# Patient Record
Sex: Male | Born: 1945 | Race: Black or African American | Hispanic: No | Smoking: Current some day smoker
Health system: Southern US, Community
[De-identification: ages and names within clinical notes are randomized; demographics above are authoritative.]

## PROBLEM LIST (undated history)

## (undated) DIAGNOSIS — I428 Other cardiomyopathies: Secondary | ICD-10-CM

## (undated) DIAGNOSIS — E785 Hyperlipidemia, unspecified: Secondary | ICD-10-CM

## (undated) DIAGNOSIS — K219 Gastro-esophageal reflux disease without esophagitis: Secondary | ICD-10-CM

## (undated) DIAGNOSIS — N400 Enlarged prostate without lower urinary tract symptoms: Secondary | ICD-10-CM

## (undated) DIAGNOSIS — I8290 Acute embolism and thrombosis of unspecified vein: Secondary | ICD-10-CM

## (undated) DIAGNOSIS — IMO0001 Reserved for inherently not codable concepts without codable children: Secondary | ICD-10-CM

## (undated) DIAGNOSIS — D649 Anemia, unspecified: Secondary | ICD-10-CM

## (undated) DIAGNOSIS — M545 Low back pain, unspecified: Secondary | ICD-10-CM

## (undated) DIAGNOSIS — R51 Headache: Secondary | ICD-10-CM

## (undated) DIAGNOSIS — I10 Essential (primary) hypertension: Secondary | ICD-10-CM

## (undated) DIAGNOSIS — I2510 Atherosclerotic heart disease of native coronary artery without angina pectoris: Secondary | ICD-10-CM

## (undated) DIAGNOSIS — E119 Type 2 diabetes mellitus without complications: Secondary | ICD-10-CM

## (undated) DIAGNOSIS — Q2730 Arteriovenous malformation, site unspecified: Secondary | ICD-10-CM

## (undated) DIAGNOSIS — Z5189 Encounter for other specified aftercare: Secondary | ICD-10-CM

## (undated) DIAGNOSIS — N182 Chronic kidney disease, stage 2 (mild): Secondary | ICD-10-CM

## (undated) DIAGNOSIS — S82401A Unspecified fracture of shaft of right fibula, initial encounter for closed fracture: Secondary | ICD-10-CM

## (undated) DIAGNOSIS — R55 Syncope and collapse: Secondary | ICD-10-CM

## (undated) DIAGNOSIS — I872 Venous insufficiency (chronic) (peripheral): Secondary | ICD-10-CM

## (undated) DIAGNOSIS — S82891A Other fracture of right lower leg, initial encounter for closed fracture: Secondary | ICD-10-CM

## (undated) DIAGNOSIS — F17200 Nicotine dependence, unspecified, uncomplicated: Secondary | ICD-10-CM

## (undated) HISTORY — DX: Hyperlipidemia, unspecified: E78.5

## (undated) HISTORY — DX: Arteriovenous malformation, site unspecified: Q27.30

## (undated) HISTORY — DX: Benign prostatic hyperplasia without lower urinary tract symptoms: N40.0

## (undated) HISTORY — DX: Nicotine dependence, unspecified, uncomplicated: F17.200

## (undated) HISTORY — DX: Low back pain, unspecified: M54.50

## (undated) HISTORY — DX: Atherosclerotic heart disease of native coronary artery without angina pectoris: I25.10

## (undated) HISTORY — DX: Reserved for inherently not codable concepts without codable children: IMO0001

## (undated) HISTORY — DX: Other cardiomyopathies: I42.8

## (undated) HISTORY — PX: CHOLECYSTECTOMY: SHX55

## (undated) HISTORY — DX: Low back pain: M54.5

## (undated) HISTORY — DX: Type 2 diabetes mellitus without complications: E11.9

## (undated) HISTORY — PX: APPENDECTOMY: SHX54

## (undated) HISTORY — DX: Essential (primary) hypertension: I10

## (undated) HISTORY — DX: Gastro-esophageal reflux disease without esophagitis: K21.9

## (undated) HISTORY — PX: CARDIAC CATHETERIZATION: SHX172

## (undated) HISTORY — DX: Acute embolism and thrombosis of unspecified vein: I82.90

## (undated) HISTORY — DX: Venous insufficiency (chronic) (peripheral): I87.2

---

## 1998-06-29 ENCOUNTER — Emergency Department (HOSPITAL_COMMUNITY): Admission: EM | Admit: 1998-06-29 | Discharge: 1998-06-29 | Payer: Self-pay | Admitting: Emergency Medicine

## 1998-06-29 ENCOUNTER — Encounter: Payer: Self-pay | Admitting: Family Medicine

## 1999-04-01 ENCOUNTER — Emergency Department (HOSPITAL_COMMUNITY): Admission: EM | Admit: 1999-04-01 | Discharge: 1999-04-01 | Payer: Self-pay | Admitting: Emergency Medicine

## 1999-06-24 ENCOUNTER — Encounter: Payer: Self-pay | Admitting: Emergency Medicine

## 1999-06-24 ENCOUNTER — Emergency Department (HOSPITAL_COMMUNITY): Admission: EM | Admit: 1999-06-24 | Discharge: 1999-06-24 | Payer: Self-pay | Admitting: Emergency Medicine

## 2000-02-05 ENCOUNTER — Ambulatory Visit (HOSPITAL_COMMUNITY): Admission: RE | Admit: 2000-02-05 | Discharge: 2000-02-05 | Payer: Self-pay | Admitting: Family Medicine

## 2000-02-05 ENCOUNTER — Encounter: Payer: Self-pay | Admitting: Family Medicine

## 2000-07-29 ENCOUNTER — Encounter: Payer: Self-pay | Admitting: Family Medicine

## 2000-07-29 ENCOUNTER — Ambulatory Visit (HOSPITAL_COMMUNITY): Admission: RE | Admit: 2000-07-29 | Discharge: 2000-07-29 | Payer: Self-pay | Admitting: Family Medicine

## 2003-08-07 ENCOUNTER — Emergency Department (HOSPITAL_COMMUNITY): Admission: EM | Admit: 2003-08-07 | Discharge: 2003-08-07 | Payer: Self-pay | Admitting: Emergency Medicine

## 2005-06-23 ENCOUNTER — Inpatient Hospital Stay (HOSPITAL_COMMUNITY): Admission: EM | Admit: 2005-06-23 | Discharge: 2005-06-25 | Payer: Self-pay | Admitting: Emergency Medicine

## 2005-06-23 ENCOUNTER — Ambulatory Visit: Payer: Self-pay | Admitting: Cardiology

## 2005-07-11 ENCOUNTER — Ambulatory Visit: Payer: Self-pay | Admitting: Internal Medicine

## 2005-10-06 ENCOUNTER — Inpatient Hospital Stay (HOSPITAL_COMMUNITY): Admission: EM | Admit: 2005-10-06 | Discharge: 2005-10-09 | Payer: Self-pay | Admitting: Emergency Medicine

## 2005-10-06 ENCOUNTER — Ambulatory Visit: Payer: Self-pay | Admitting: Cardiovascular Disease

## 2005-10-06 ENCOUNTER — Encounter (INDEPENDENT_AMBULATORY_CARE_PROVIDER_SITE_OTHER): Payer: Self-pay | Admitting: Cardiology

## 2005-10-26 ENCOUNTER — Inpatient Hospital Stay (HOSPITAL_COMMUNITY): Admission: EM | Admit: 2005-10-26 | Discharge: 2005-10-30 | Payer: Self-pay | Admitting: Emergency Medicine

## 2005-10-27 ENCOUNTER — Encounter: Payer: Self-pay | Admitting: Gastroenterology

## 2005-10-28 ENCOUNTER — Encounter (INDEPENDENT_AMBULATORY_CARE_PROVIDER_SITE_OTHER): Payer: Self-pay | Admitting: *Deleted

## 2005-10-28 ENCOUNTER — Encounter: Payer: Self-pay | Admitting: Gastroenterology

## 2005-10-28 ENCOUNTER — Encounter (INDEPENDENT_AMBULATORY_CARE_PROVIDER_SITE_OTHER): Payer: Self-pay | Admitting: Specialist

## 2005-10-29 ENCOUNTER — Ambulatory Visit: Payer: Self-pay | Admitting: Gastroenterology

## 2005-12-02 ENCOUNTER — Ambulatory Visit: Payer: Self-pay | Admitting: Gastroenterology

## 2006-01-22 ENCOUNTER — Ambulatory Visit (HOSPITAL_COMMUNITY): Admission: RE | Admit: 2006-01-22 | Discharge: 2006-01-22 | Payer: Self-pay | Admitting: Internal Medicine

## 2006-08-17 ENCOUNTER — Emergency Department (HOSPITAL_COMMUNITY): Admission: EM | Admit: 2006-08-17 | Discharge: 2006-08-18 | Payer: Self-pay | Admitting: Emergency Medicine

## 2007-05-03 ENCOUNTER — Emergency Department (HOSPITAL_COMMUNITY): Admission: EM | Admit: 2007-05-03 | Discharge: 2007-05-03 | Payer: Self-pay | Admitting: Emergency Medicine

## 2007-05-05 ENCOUNTER — Emergency Department (HOSPITAL_COMMUNITY): Admission: EM | Admit: 2007-05-05 | Discharge: 2007-05-05 | Payer: Self-pay | Admitting: Emergency Medicine

## 2007-05-06 ENCOUNTER — Ambulatory Visit (HOSPITAL_COMMUNITY): Admission: RE | Admit: 2007-05-06 | Discharge: 2007-05-06 | Payer: Self-pay | Admitting: *Deleted

## 2007-05-07 ENCOUNTER — Ambulatory Visit: Payer: Self-pay | Admitting: Vascular Surgery

## 2007-05-07 ENCOUNTER — Inpatient Hospital Stay (HOSPITAL_COMMUNITY): Admission: AD | Admit: 2007-05-07 | Discharge: 2007-05-10 | Payer: Self-pay | Admitting: Internal Medicine

## 2007-05-07 ENCOUNTER — Encounter (INDEPENDENT_AMBULATORY_CARE_PROVIDER_SITE_OTHER): Payer: Self-pay | Admitting: Internal Medicine

## 2007-05-20 ENCOUNTER — Emergency Department (HOSPITAL_COMMUNITY): Admission: EM | Admit: 2007-05-20 | Discharge: 2007-05-20 | Payer: Self-pay | Admitting: *Deleted

## 2007-05-24 ENCOUNTER — Encounter: Admission: RE | Admit: 2007-05-24 | Discharge: 2007-05-24 | Payer: Self-pay | Admitting: Internal Medicine

## 2007-05-28 ENCOUNTER — Emergency Department (HOSPITAL_COMMUNITY): Admission: EM | Admit: 2007-05-28 | Discharge: 2007-05-28 | Payer: Self-pay | Admitting: Emergency Medicine

## 2007-07-08 ENCOUNTER — Emergency Department (HOSPITAL_COMMUNITY): Admission: EM | Admit: 2007-07-08 | Discharge: 2007-07-08 | Payer: Self-pay | Admitting: Emergency Medicine

## 2007-10-12 ENCOUNTER — Ambulatory Visit: Payer: Self-pay | Admitting: Vascular Surgery

## 2007-10-12 ENCOUNTER — Encounter (INDEPENDENT_AMBULATORY_CARE_PROVIDER_SITE_OTHER): Payer: Self-pay | Admitting: Emergency Medicine

## 2007-10-12 ENCOUNTER — Inpatient Hospital Stay (HOSPITAL_COMMUNITY): Admission: EM | Admit: 2007-10-12 | Discharge: 2007-10-18 | Payer: Self-pay | Admitting: Emergency Medicine

## 2008-01-17 ENCOUNTER — Emergency Department (HOSPITAL_COMMUNITY): Admission: EM | Admit: 2008-01-17 | Discharge: 2008-01-17 | Payer: Self-pay | Admitting: Family Medicine

## 2008-12-25 ENCOUNTER — Inpatient Hospital Stay (HOSPITAL_COMMUNITY): Admission: EM | Admit: 2008-12-25 | Discharge: 2008-12-26 | Payer: Self-pay | Admitting: Emergency Medicine

## 2009-01-09 ENCOUNTER — Emergency Department (HOSPITAL_COMMUNITY): Admission: EM | Admit: 2009-01-09 | Discharge: 2009-01-09 | Payer: Self-pay | Admitting: Emergency Medicine

## 2009-01-12 ENCOUNTER — Ambulatory Visit: Payer: Self-pay | Admitting: Gastroenterology

## 2009-01-12 ENCOUNTER — Encounter (INDEPENDENT_AMBULATORY_CARE_PROVIDER_SITE_OTHER): Payer: Self-pay | Admitting: Emergency Medicine

## 2009-01-12 ENCOUNTER — Inpatient Hospital Stay (HOSPITAL_COMMUNITY): Admission: EM | Admit: 2009-01-12 | Discharge: 2009-01-18 | Payer: Self-pay | Admitting: Emergency Medicine

## 2009-01-12 ENCOUNTER — Ambulatory Visit: Payer: Self-pay | Admitting: Vascular Surgery

## 2009-01-15 ENCOUNTER — Encounter (INDEPENDENT_AMBULATORY_CARE_PROVIDER_SITE_OTHER): Payer: Self-pay | Admitting: *Deleted

## 2009-01-15 ENCOUNTER — Encounter: Payer: Self-pay | Admitting: Gastroenterology

## 2009-01-16 ENCOUNTER — Ambulatory Visit: Payer: Self-pay | Admitting: Vascular Surgery

## 2009-01-18 ENCOUNTER — Encounter (INDEPENDENT_AMBULATORY_CARE_PROVIDER_SITE_OTHER): Payer: Self-pay | Admitting: *Deleted

## 2009-02-22 ENCOUNTER — Ambulatory Visit: Payer: Self-pay | Admitting: Vascular Surgery

## 2009-08-23 ENCOUNTER — Ambulatory Visit: Payer: Self-pay | Admitting: Vascular Surgery

## 2009-12-26 ENCOUNTER — Emergency Department (HOSPITAL_COMMUNITY): Admission: EM | Admit: 2009-12-26 | Discharge: 2009-12-26 | Payer: Self-pay | Admitting: Emergency Medicine

## 2010-01-17 ENCOUNTER — Ambulatory Visit: Payer: Self-pay | Admitting: Cardiology

## 2010-01-17 DIAGNOSIS — I10 Essential (primary) hypertension: Secondary | ICD-10-CM | POA: Insufficient documentation

## 2010-01-23 ENCOUNTER — Ambulatory Visit: Payer: Self-pay | Admitting: Internal Medicine

## 2010-01-23 ENCOUNTER — Ambulatory Visit (HOSPITAL_COMMUNITY): Admission: RE | Admit: 2010-01-23 | Discharge: 2010-01-23 | Payer: Self-pay | Admitting: Internal Medicine

## 2010-01-23 ENCOUNTER — Encounter: Payer: Self-pay | Admitting: Cardiology

## 2010-01-23 ENCOUNTER — Ambulatory Visit: Payer: Self-pay | Admitting: Cardiovascular Disease

## 2010-01-23 ENCOUNTER — Ambulatory Visit: Payer: Self-pay

## 2010-01-23 DIAGNOSIS — I2510 Atherosclerotic heart disease of native coronary artery without angina pectoris: Secondary | ICD-10-CM

## 2010-01-29 ENCOUNTER — Telehealth: Payer: Self-pay | Admitting: Cardiology

## 2010-01-30 ENCOUNTER — Emergency Department (HOSPITAL_COMMUNITY): Admission: EM | Admit: 2010-01-30 | Discharge: 2010-01-30 | Payer: Self-pay | Admitting: Emergency Medicine

## 2010-01-31 ENCOUNTER — Telehealth: Payer: Self-pay | Admitting: Cardiology

## 2010-02-12 DIAGNOSIS — R9389 Abnormal findings on diagnostic imaging of other specified body structures: Secondary | ICD-10-CM

## 2010-02-12 LAB — CONVERTED CEMR LAB
HDL: 37.4 mg/dL — ABNORMAL LOW (ref >39.00)
LDL Cholesterol: 130 mg/dL — ABNORMAL HIGH (ref 0–99)
Triglycerides: 80 mg/dL (ref 0.0–149.0)
VLDL: 16 mg/dL (ref 0.0–40.0)

## 2010-02-18 ENCOUNTER — Telehealth (INDEPENDENT_AMBULATORY_CARE_PROVIDER_SITE_OTHER): Payer: Self-pay | Admitting: *Deleted

## 2010-02-19 ENCOUNTER — Encounter (HOSPITAL_COMMUNITY): Admission: RE | Admit: 2010-02-19 | Discharge: 2010-03-04 | Payer: Self-pay | Admitting: Cardiology

## 2010-02-19 ENCOUNTER — Encounter: Payer: Self-pay | Admitting: Cardiology

## 2010-02-19 ENCOUNTER — Encounter: Payer: Self-pay | Admitting: Cardiovascular Disease

## 2010-02-19 ENCOUNTER — Ambulatory Visit: Payer: Self-pay

## 2010-02-19 ENCOUNTER — Ambulatory Visit: Payer: Self-pay | Admitting: Cardiology

## 2010-02-26 ENCOUNTER — Ambulatory Visit: Payer: Self-pay | Admitting: Cardiology

## 2010-02-26 DIAGNOSIS — E785 Hyperlipidemia, unspecified: Secondary | ICD-10-CM

## 2010-02-28 ENCOUNTER — Ambulatory Visit: Payer: Self-pay | Admitting: Cardiology

## 2010-03-05 LAB — CONVERTED CEMR LAB
BUN: 19 mg/dL (ref 6–23)
Basophils Absolute: 0.1 10*3/uL (ref 0.0–0.1)
CO2: 28 meq/L (ref 19–32)
Creatinine, Ser: 1.5 mg/dL (ref 0.4–1.5)
Eosinophils Relative: 5.7 % — ABNORMAL HIGH (ref 0.0–5.0)
GFR calc non Af Amer: 59.6 mL/min (ref >60)
HCT: 37.1 % — ABNORMAL LOW (ref 39.0–52.0)
Hemoglobin: 12.1 g/dL — ABNORMAL LOW (ref 13.0–17.0)
INR: 1 (ref 0.8–1.0)
Lymphocytes Relative: 23.3 % (ref 12.0–46.0)
Neutro Abs: 3.9 10*3/uL (ref 1.4–7.7)
Platelets: 179 10*3/uL (ref 150.0–400.0)
Sodium: 138 meq/L (ref 135–145)

## 2010-03-06 ENCOUNTER — Ambulatory Visit: Payer: Self-pay | Admitting: Cardiology

## 2010-03-06 ENCOUNTER — Telehealth: Payer: Self-pay | Admitting: Cardiology

## 2010-03-06 ENCOUNTER — Inpatient Hospital Stay (HOSPITAL_BASED_OUTPATIENT_CLINIC_OR_DEPARTMENT_OTHER): Admission: RE | Admit: 2010-03-06 | Discharge: 2010-03-06 | Payer: Self-pay | Admitting: Cardiology

## 2010-03-18 ENCOUNTER — Ambulatory Visit: Payer: Self-pay | Admitting: Cardiology

## 2010-03-18 ENCOUNTER — Telehealth: Payer: Self-pay | Admitting: Cardiology

## 2010-03-18 DIAGNOSIS — R609 Edema, unspecified: Secondary | ICD-10-CM

## 2010-03-18 DIAGNOSIS — R0602 Shortness of breath: Secondary | ICD-10-CM | POA: Insufficient documentation

## 2010-03-19 ENCOUNTER — Telehealth: Payer: Self-pay | Admitting: Cardiology

## 2010-04-01 ENCOUNTER — Telehealth: Payer: Self-pay | Admitting: Cardiology

## 2010-04-17 ENCOUNTER — Telehealth: Payer: Self-pay | Admitting: Cardiology

## 2010-04-17 ENCOUNTER — Ambulatory Visit: Payer: Self-pay

## 2010-04-23 ENCOUNTER — Ambulatory Visit: Payer: Self-pay | Admitting: Cardiology

## 2010-06-18 NOTE — Progress Notes (Signed)
Summary: B/P readings/RETURNING YOUR CALL   Phone Note Outgoing Call   Call placed by: Katina Dung, RN, BSN,  April 17, 2010 3:05 PM Call placed to: Patient Summary of Call: B/P readings  Follow-up for Phone Call        [facility] to get recent B/P readings Katina Dung, RN, BSN  April 17, 2010 3:16 PM--LMTCB Katina Dung, RN, BSN  April 17, 2010 5:33 PM --NA Katina Dung, RN, BSN  April 18, 2010 10:01 AM [facility] Katina Dung, RN, BSN  April 19, 2010 2:02 PM      Additional Follow-up for Phone Call Additional follow up Details #1::        L..Last PT : 10.6  02/28/2010    Additional Follow-up for Phone Call Additional follow up Details #2::    PT RETURNING YOU CALL.Last PT : 10.6  02/28/2010 STATES HE WILL BE HOME FOR THEN HE WILL BE GOING OUT OF TOWN. PT ALSO STATES HIS BLOOD PRESSURE HAS BEEN FINE.--I will forward to Dr Shirlee Latch for review Follow-up by: Roe Coombs,  April 22, 2010 11:39 AM

## 2010-06-18 NOTE — Assessment & Plan Note (Signed)
Summary: OK PER ANNE/PER CALL TO PT/SF  Medications Added METOPROLOL SUCCINATE 100 MG XR24H-TAB (METOPROLOL SUCCINATE) Take one and one-half  tablets  by mouth daily AVODART 0.5 MG CAPS (DUTASTERIDE) 1 by mouth daily BIDIL 20-37.5 MG TABS (ISOSORB DINITRATE-HYDRALAZINE) one three times a day OXYCODONE-ACETAMINOPHEN 5-325 MG TABS (OXYCODONE-ACETAMINOPHEN) as needed      Allergies Added: NKDA  Primary Provider:  Dr. Pecola Leisure   History of Present Illness: 65 yo with history of multiple DVTs, labile HTN, and diabetes returns for cardiology followup.   He has a long history of DVTs, most recently in 8/10, with resulting chronic venous insufficiency.  He has an IVC filter.  His coumadin was stopped about 1.5 years ago (no history of GI bleeding).    Patient has reported some exertional dyspnea recently.  He is short of breath climbing a flight of steps or doing more moderate exertion.  Not much problem on flat ground.  No chest pain or tightness.  He had an echo done given the exertional dyspnea which showed septal and apical akinesis with EF 45%.  Because of this, Lexiscan-myoview was done showing EF 43% and a possible small area of apical ischemia.  LDL was noted to be elevated and patient was recently started on a statin.    Labs (8/11): creatinine 1.4 Labs (9/11): LDL 130, HDL 37  Current Medications (verified): 1)  Diltiazem Hcl 120 Mg Tabs (Diltiazem Hcl) .Marland Kitchen.. 1 Tab Qd 2)  Metoprolol Succinate 100 Mg Xr24h-Tab (Metoprolol Succinate) .... Take One Tablet By Mouth Daily 3)  Azor 5-40 Mg Tabs (Amlodipine-Olmesartan) .Marland Kitchen.. 1tabqd 4)  Glipizide 5 Mg Tabs (Glipizide) .Marland Kitchen.. 1tabqd 5)  Protonix 40 Mg Tbec (Pantoprazole Sodium) .Marland Kitchen.. 1tabqd 6)  Avodart 0.5 Mg Caps (Dutasteride) .Marland Kitchen.. 1 By Mouth Daily 7)  Hydralazine Hcl 25 Mg Tabs (Hydralazine Hcl) .Marland Kitchen.. 1 Three Times A Day 8)  Aspirin 81 Mg Tbec (Aspirin) .... Take One Tablet By Mouth Daily 9)  Simvastatin 40 Mg Tabs (Simvastatin) .... One in The  Evening 10)  Oxycodone-Acetaminophen 5-325 Mg Tabs (Oxycodone-Acetaminophen) .... As Needed  Allergies (verified): No Known Drug Allergies  Past History:  Past Medical History: 1. 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  IVC filter placed in 2005.  Most recent DVT was in right leg in 8/11.   2.  Duodenal AVM with GI bleed 3.  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  Diabetes mellitus type II 5.  Smoking 6.  GERD 7.  BPH 8.  Low back pain 9.  HTN 10.  CAD: Left heart cath 2007 with 40-50% stenosis in a small RCA, EF 50%.  Echo (9/11): Septal and apical akinesis, dilated LV, EF 45%, RV normal size and systolic function. Lexiscan myoview (10/11): EF 43%, global hypokinesis, possible small area of apical ischemia.  11.  Hyperlipidemia  Review of Systems       All systems reviewed and negative except as per HPI.   Vital Signs:  Patient profile:   65 year old male Height:      72 inches Weight:      201 pounds BMI:     27.36 Pulse rate:   83 / minute Resp:     18 per minute BP sitting:   143 / 87  (left arm)  Vitals Entered By: Marrion Coy, CNA (February 26, 2010 8:39 AM)  Physical Exam  General:  Well developed, well nourished, in no acute distress. Neck:  Neck supple, no JVD. No masses, thyromegaly  or abnormal cervical nodes. Lungs:  Clear bilaterally to auscultation and percussion. Heart:  Non-displaced PMI, chest non-tender; regular rate and rhythm, S1, S2 without murmurs, rubs or gallops. Carotid upstroke normal, no bruit. 1+ edema to the knee on the right, 1+ edema 1/2 up the lower leg on the left.   Abdomen:  Bowel sounds positive; abdomen soft and non-tender without masses, organomegaly, or hernias noted. No hepatosplenomegaly. Extremities:  No clubbing or cyanosis. Neurologic:  Alert and oriented x 3. Psych:  Normal affect.   Impression & Recommendations:  Problem # 1:  CORONARY ATHEROSCLEROSIS NATIVE CORONARY ARTERY (ICD-414.01) Patient  has a history of nonobstructive CAD.  He has exertional dyspnea that seems relatively new.  Echo showed wall motion abnormalities (septum and apex) with EF 45%.  Myoview confirmed mildly decreased EF and showed some possible apical ischemia.  It is possible that the dyspnea is an anginal equivalent.  Pulmonary hypertension due to chronic thromboembolic disease would also be a concern in this patient but the RV looked normal on echo, making this diagnosis less likely.   - Continue ASA, statin, beta blocker - Will arrange for left heart cath, will use radial access. BMET today prior to cath.   Problem # 2:  ECHOCARDIOGRAM, ABNORMAL (ICD-793.2) Mildly decreased LV systolic function.  Will stop diltiazem.  Will increase Toprol XL to 150 mg qday and instead of hydralazine will have him take Bidil 1 tab three times a day.  He is already on an ARB.   Problem # 3:  HYPERTENSION, UNSPECIFIED (ICD-401.9) BP is still mildly elevated.  As above, will increase Toprol XL to 150 mg daily and stop hydralazine (starting Bidil instead).  Will stop diltiazem given decreased LV systolic function.   Problem # 4:  HYPERLIPIDEMIA-MIXED (ICD-272.4) Check lipids on statin in 2 months.   Problem # 5:  VENOUS THROMBOEMBOLISM Patient has had multiple DVTs over the last 10 years.  He now has chronic venous insufficiency. He does have an IVC filter.  I think that he needs to restart coumadin as I think that his risk of a recurrent event is very high (has not restarted it yet).  I will have him wait until after cath next week to restart coumadin.  If he needs intervention, would favor bare metal stent.   Patient Instructions: 1)  Your physician has recommended you make the following change in your medication:  2)  Stop Diltiazem. 3)  Increase Toprol(metoprolol) XL  to 150mg  daily--this will be one and one-half 100mg  tablets daily. 4)  Stop Hydralazine. 5)  Start BiDil 20/37.5mg  three times a day. 6)  Lab this Thursday  October 13--BMP/CBC/PT 786.09  794.30 7)  Your physician recommends that you return for a FASTING lipid profile/liver profile in 2 months--786.09  794.30  8)  Your physician has requested that you have a cardiac catheterization.  Cardiac catheterization is used to diagnose and/or treat various heart conditions. Doctors may recommend this procedure for a number of different reasons. The most common reason is to evaluate chest pain. Chest pain can be a symptom of coronary artery disease (CAD), and cardiac catheterization can show whether plaque is narrowing or blocking your heart's arteries. This procedure is also used to evaluate the valves, as well as measure the blood flow and oxygen levels in different parts of your heart.  For further information please visit [email or web address]  Please follow instruction sheet, as given. 9)  Wednesday October 19,2011 10)  Your physician recommends that you schedule a  follow-up appointment 2 weeks  after the cath on Wednesday October 19,2011. Prescriptions: SIMVASTATIN 40 MG TABS (SIMVASTATIN) one in the evening  #30 x 3   Entered by:   Katina Dung, RN, BSN   Authorized by:   Marca Ancona, MD   Signed by:   Katina Dung, RN, BSN on 02/26/2010   Method used:   Electronically to        [facility] [phone]* (retail)       [address]       [place], [place]  [address]       Ph: [phone]       Fax: [phone]   RxID:   [id number] BIDIL 20-37.5 MG TABS (ISOSORB DINITRATE-HYDRALAZINE) one three times a day  #90 x 6   Entered by:   Katina Dung, RN, BSN   Authorized by:   Marca Ancona, MD   Signed by:   Katina Dung, RN, BSN on 02/26/2010   Method used:   Electronically to        [facility] [phone] (retail)       [address]       [place], [place]  [address]       Ph: [phone]       Fax: [phone]   RxID:   [id number] METOPROLOL SUCCINATE 100 MG XR24H-TAB (METOPROLOL SUCCINATE) Take one and one-half  tablets  by  mouth daily  #50 x 6   Entered by:   Katina Dung, RN, BSN   Authorized by:   Marca Ancona, MD   Signed by:   Katina Dung, RN, BSN on 02/26/2010   Method used:   Electronically to        [facility] [phone]* (retail)       [address]       [place], [place]  [address]       Ph: [phone]       Fax: [phone]   RxID:   [id number]

## 2010-06-18 NOTE — Progress Notes (Signed)
Summary: meds renewal   Phone Note Other Incoming   Caller: insurance/[name] [facility] Summary of Call: [name] from [facility] pre[facility] states meds are not cover by the plan AZOR 5-40 MG TABS 1 daily and BIDIL 20-37.5 MG TAB.  [name] states he will fax a form to get the meds approved please write stat so it can be process in 24 hours for the pt. if any question [phone] ref# [id number] Initial call taken by: Roe Coombs,  March 18, 2010 4:23 PM     Appended Document: meds renewal If we can't get coverage, change Azor to amlodipine 5 mg daily and olmesartan 40 mg daily and change Bidil to hydralazine 75 mg three times a day and isordil 40 mg three times a day.   Appended Document: meds renewal I talked with Mya at [facility] [phone] states Azor and Bidil are not authorized medications--for Azor Dr Shirlee Latch recommended Amlodipine 5mg  daily and Olmesartan 40mg  daily--Olmesartan is not preferred so Dr Shirlee Latch recommended Losartan 100mg  daily in the place of Olmesartan--for Bidil Dr Shirlee Latch recommended  hydralazine 75mg  three times a day and Isordil 40mg  three times a day --both of these are preferred --I called pt to let him know this ==pt states he was told Azor and Bidil would be covered by [facility] yesterday and was not  satisfied with these changes--he  stated that he would call [facility] and call me back

## 2010-06-18 NOTE — Progress Notes (Signed)
Summary: B/P readings   Phone Note Outgoing Call   Call placed by: Katina Dung, RN, BSN,  April 01, 2010 7:25 AM Call placed to: Patient Summary of Call: call and get B/P readings  Follow-up for Phone Call        Dr Shirlee Latch saw pt 03/18/10--meds increased--insurance would not authorize Azor and Bi Dil meds changed 03/19/10 from Azor to Amlodipine  5mg   and Losartan 100mg  --BiDIl changed to Isosorbide 40mg  tid  and Hydrazaline 75mg  three times a day call--call and get B/P  readings [name]  talked with pt about his B/P--recent B/P readings 03/25/10 150/120 03/27/10  106/97 03/31/10  149/? 04/01/10  106/97 pt states he hasn't  picked up all his B/P medications yet--there is one that he still needs to get--he did not have time to review his medications right now but said he would call me back in about an hour so we could review his medications [name] --I talked with pt--meds reviewed over telephone--pt only taking Metoprolol XL 100mg  daily and he has not started  Isosorbide 40mg  three times a day yet--he will get the Isosorbide 40mg  tid  today and begin taking Isosorbide  40mg  three times a day --he will also increase Metoprolol XL to 1 1/2 daily --he will continue to take and  record his B/P about 11/2 hours after he takes his morning medication--I will follow up with him in 2 weeks to get B/P readings-pt will also record a pulse when he checks his B/P --I will forward to Dr Shirlee Latch for review

## 2010-06-18 NOTE — Assessment & Plan Note (Signed)
Summary: Cardiology Nuclear Testing  Nuclear Med Background Indications for Stress Test: Evaluation for Ischemia   History: Echo, Heart Catheterization, Myocardial Infarction  History Comments: 07 Cath 40-50% small RCA and EF 50% 01/23/10 Echo EF 45% Hx of Pulm HTN  Symptoms: Dizziness, DOE    Nuclear Pre-Procedure Cardiac Risk Factors: Family History - CAD, Hypertension, Lipids, NIDDM, RBBB Caffeine/Decaff Intake: none NPO After: 9:30 PM Lungs: clear IV 0.9% NS with Angio Cath: 22g     IV Site: R Hand IV Started by: Cathlyn Parsons, RN Chest Size (in) 44     Height (in): 72 Weight (lb): 196 BMI: 26.68 Tech Comments: Pt took Metoprolol this am.  Nuclear Med Study 1 or 2 day study:  1 day     Stress Test Type:  Eugenie Birks Reading MD:  Marca Ancona, MD     Referring MD:  D.McLean Resting Radionuclide:  Technetium 48m Tetrofosmin     Resting Radionuclide Dose:  11.0 mCi  Stress Radionuclide:  Technetium 93m Tetrofosmin     Stress Radionuclide Dose:  33.0 mCi   Stress Protocol   Lexiscan: 0.4 mg   Stress Test Technologist:  Milana Na, EMT-P     Nuclear Technologist:  Domenic Polite, CNMT  Rest Procedure  Myocardial perfusion imaging was performed at rest 45 minutes following the intravenous administration of Technetium 16m Tetrofosmin.  Stress Procedure  The patient received IV Lexiscan 0.4 mg over 15-seconds.  Technetium 67m Tetrofosmin injected at 30-seconds.  There were no significant changes with infusion.  Quantitative spect images were obtained after a 45 minute delay.  QPS Raw Data Images:  Normal; no motion artifact; normal heart/lung ratio. Stress Images:  There is mild decreased uptake in the apex. Rest Images:  Normal homogeneous uptake in all areas of the myocardium. Subtraction (SDS):  Normal Transient Ischemic Dilatation:  1.16  (Normal <1.22)  Lung/Heart Ratio:  .27  (Normal <0.45)  Quantitative Gated Spect Images QGS EDV:  141 ml QGS ESV:   81 ml QGS EF:  43 % QGS cine images:  Global hypokinesis with overall moderate LV dysfunction.  Findings Low risk nuclear study      Overall Impression  Exercise Capacity: Lexiscan with no exercise. BP Response: Normal blood pressure response. Clinical Symptoms: No chest pain ECG Impression: No significant ST segment change suggestive of ischemia. Overall Impression: Low risk stress nuclear study. Overall Impression Comments: Low risk study with possible mild ischemia in the apical region.  Appended Document: Cardiology Nuclear Testing Abnormal LV function and possible apical ischemia.  I need to see him back in the clinic to discuss findings.  Would like to discuss cath.   Appended Document: Cardiology Nuclear Testing pt has an appt with Dr Shirlee Latch 02/26/2010 and results will be discussed then.  Appended Document: Cardiology Nuclear Testing JV cath 03/06/10

## 2010-06-18 NOTE — Letter (Signed)
Summary: Cardiac Catheterization Instructions- [facility]  [facility], Main Office  [address]   [place], [place] [address]   Phone: [phone]  Fax: [phone]     02/26/2010 MRN: [id number]  Dodd Offner [address] [place], [place]  [address]  Dear Mr. WHITELEY,   You are scheduled for a Cardiac Catheterization on Wednesday October 19,2011 with Dr. Marca Ancona.  Please arrive to the 1st floor of the Heart and Vascular Center at [facility] at 12 noon on the day of your procedure. Please do not arrive before 6:30 a.m. Call the Heart and Vascular Center at [phone] if you are unable to make your appointmnet. The Code to get into the parking garage under the building is 0200. Take the elevators to the 1st floor. You must have someone to drive you home. Someone must be with you for the first 24 hours after you arrive home. Please wear clothes that are easy to get on and off and wear slip-on shoes. Nothing to eat after midnight. You may have clear liquids until 7AM. Bring all your medications and current insurance cards with you.  __x_ DO NOT take these medications before your procedure:   DO NOT TAKE GLIPIZIDE THE MORNING OF THE PROCEDURE.  _x__ Make sure you take your aspirin.  _x__ You may take ALL of your remaining  medications with water that morning.    The usual length of stay after your procedure is 2 to 3 hours. This can vary.  If you have any questions, please call the office at the number listed above.   Katina Dung, RN, BSN

## 2010-06-18 NOTE — Assessment & Plan Note (Signed)
Summary: S/P CATH/ 10-19/SAF  Medications Added AZOR 5-40 MG TABS (AMLODIPINE-OLMESARTAN) 1 daily BIDIL 20-37.5 MG TABS (ISOSORB DINITRATE-HYDRALAZINE) two 3  times a day WARFARIN SODIUM 3 MG TABS (WARFARIN SODIUM) one daily or as directed      Allergies Added: NKDA  Primary Provider:  Dr. Pecola Leisure   History of Present Illness: 65 yo with history of multiple DVTs, labile HTN, and diabetes returns for cardiology followup.   He has a long history of DVTs, most recently in 8/10, with resulting chronic venous insufficiency.  He has an IVC filter.  His coumadin was stopped about 1.5 years ago (no history of GI bleeding).    Patient has reported some exertional dyspnea recently.  He is short of breath climbing a flight of steps or doing more moderate exertion.  Not much problem on flat ground.  No chest pain or tightness.  He had an echo done given the exertional dyspnea which showed septal and apical akinesis with EF 45%.  Because of this, Lexiscan-myoview was done showing EF 43% and a possible small area of apical ischemia.  LDL was noted to be elevated and patient was recently started on a statin.    Given abnormal echo and myoview and concern for CAD as the cause, LHC was done 10/11.  This showed no angiographic CAD.  The myoview was likely a false positive.  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  He has stable dyspnea with moderate exertion.  Overall feeling well.  He is going to restart coumadin tonight.  BP is elevated at 152/98 today but he has been out of Azor.   Labs (8/11): creatinine 1.4 Labs (9/11): LDL 130, HDL 37 Labs (10/11): K 4.0, creatinine 1.5  Current Medications (verified): 1)  Metoprolol Succinate 100 Mg Xr24h-Tab (Metoprolol Succinate) .... Take One and One-Half  Tablets  By Mouth Daily 2)  Azor 5-40 Mg Tabs (Amlodipine-Olmesartan) .Marland Kitchen.. 1tabqd 3)  Glipizide 5 Mg Tabs (Glipizide) .Marland Kitchen.. 1tabqd 4)  Protonix 40 Mg Tbec (Pantoprazole Sodium) .Marland Kitchen.. 1tabqd 5)   Avodart 0.5 Mg Caps (Dutasteride) .Marland Kitchen.. 1 By Mouth Daily 6)  Bidil 20-37.5 Mg Tabs (Isosorb Dinitrate-Hydralazine) .... One Three Times A Day 7)  Aspirin 81 Mg Tbec (Aspirin) .... Take One Tablet By Mouth Daily 8)  Simvastatin 40 Mg Tabs (Simvastatin) .... One in The Evening 9)  Oxycodone-Acetaminophen 5-325 Mg Tabs (Oxycodone-Acetaminophen) .... As Needed 10)  Warfarin Sodium 5 Mg Tabs (Warfarin Sodium) .... Use As Directed By Anticoagulation Clinic  Allergies (verified): No Known Drug Allergies  Past History:  Past Medical History: 1. 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  IVC filter placed in 2005.  Most recent DVT was in right leg in 8/11.   2.  Duodenal AVM with GI bleed 3.  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  Diabetes mellitus type II 5.  Smoking 6.  GERD 7.  BPH 8.  Low back pain 9.  HTN 10.  CAD: Left heart cath 2007 with 40-50% stenosis in a small RCA, EF 50%.  Echo (9/11): Septal and apical akinesis, dilated LV, EF 45%, RV normal size and systolic function. Lexiscan myoview (10/11): EF 43%, global hypokinesis, possible small area of apical ischemia.   LHC (10/11): No angiographic CAD.  No LV-gram done due to CKD.  11.  Hyperlipidemia 12.  Nonischemic CMP: Echo (9/11) with septal and apical akinesis, dilated LV, EF 45%, RV normal size and systolic function.  EF 43% on myoview.  Family History: Reviewed history from 01/17/2010 and no changes required. No premature CAD.   Social History: Reviewed history from 01/17/2010 and no changes required. Smokes 1 ppd Part-time work as a Media planner, 1 son  Review of Systems       All systems reviewed and negative except as per HPI.   Vital Signs:  Patient profile:   65 year old male Height:      72 inches Weight:      202 pounds BMI:     27.50 Pulse rate:   97 / minute Resp:     16 per minute BP sitting:   152 / 98  (right arm)  Vitals Entered By: Marrion Coy, CNA (March 18, 2010 11:42 AM)  Physical Exam  General:  Well developed, well nourished, in no acute distress. Neck:  Neck supple, no JVD. No masses, thyromegaly or abnormal cervical nodes. Lungs:  Clear bilaterally to auscultation and percussion. Heart:  Non-displaced PMI, chest non-tender; regular rate and rhythm, S1, S2 without murmurs, rubs or gallops. Carotid upstroke normal, no bruit. 1+ edema to the knee on the right, 1+ edema 1/2 up the lower leg on the left.   Abdomen:  Bowel sounds positive; abdomen soft and non-tender without masses, organomegaly, or hernias noted. No hepatosplenomegaly. Extremities:  No clubbing or cyanosis. Neurologic:  Alert and oriented x 3. Psych:  Normal affect.   Impression & Recommendations:  Problem # 1:  CORONARY ATHEROSCLEROSIS NATIVE CORONARY ARTERY (ICD-414.01) Probably false positive myoview.  No angiographic CAD on cath.  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   Problem # 2:  HYPERTENSION, UNSPECIFIED (ICD-401.9) B P still high.  Needs to refill Azor.  Will also increase Bidil to 2 tabs two times a day given mild cardiomyopathy.   Problem # 3:  NONISCHEMIC CARDIOMYOPATHY EF 45% by echo, 43% by myoview.  No angiographic CAD.  Has lower extremity edema but neck veins not elevated. Suspect he is not significantly volume overloaded and lower extremity edema is due to venous insufficiency post-DVTs.  Would continue current dose of Toprol XL, continue ARB (in Azor), and increasing Bidil to 2 tabs three times a day. BP check in 2 wks.    Problem # 4:  HYPERLIPIDEMIA-MIXED (ICD-272.4) Lipids/LFTs in 12/11.   Problem # 5:  SMOKING Advised to quit.   Problem # 6:  VENOUS THROMBOEMBOLISM Patient has had multiple DVTs over the last 10 years.  He now has chronic venous insufficiency. He does have an IVC filter.  I think that he needs to restart coumadin as I think that his risk of a recurrent event is very high (has not restarted it yet).  He will restart it today (now that he is  post-cath.  When he starts coumadin, can stop ASA.    Problem # 7:  DYSPNEA (ICD-786.05) Probably multifactorial.  Does not appear significantly volume overloaded but cannot fully rule out CHF given low EF. Suspect there may be a component of COPD (ongoing smoking), and there may be a component of obesity/deconditioning.  Patient Instructions: 1)  Your physician has recommended you make the following change in your medication:  2)  Stop Aspirin today. 3)  Start Coumadin 3 mg today.  4)  Increase Bidil 20/37.5mg   to two tablets three times a day. 5)  Appointment in [facility]) this Friday November 4 at 2:15.  6)  Take and record your blood pressure--I will call you in 2 weeks to get the readings. Luana Shu [phone]  7)  Your physician wants you to follow-up in:  6 months with Dr Shirlee Latch. You will receive a reminder letter in the mail two months in advance. If you don't receive a letter, please call our office to schedule the follow-up appointment. Prescriptions: BIDIL 20-37.5 MG TABS (ISOSORB DINITRATE-HYDRALAZINE) two 3  times a day  #180 x 11   Entered by:   Katina Dung, RN, BSN   Authorized by:   Marca Ancona, MD   Signed by:   Katina Dung, RN, BSN on 03/18/2010   Method used:   Electronically to        [facility] [phone]* (retail)       [address]       [place], [place]  [address]       Ph: [phone]       Fax: [phone]   RxID:   [id number] AZOR 5-40 MG TABS (AMLODIPINE-OLMESARTAN) 1 daily  #30 x 11   Entered by:   Katina Dung, RN, BSN   Authorized by:   Marca Ancona, MD   Signed by:   Katina Dung, RN, BSN on 03/18/2010   Method used:   Electronically to        [facility] [phone]* (retail)       [address]       [place], [place]  [address]       Ph: [phone]       Fax: [phone]   RxID:   [id number]

## 2010-06-18 NOTE — Progress Notes (Signed)
Summary: Start Coumadin  Medications Added WARFARIN SODIUM 5 MG TABS (WARFARIN SODIUM) Use as directed by Anticoagulation Clinic       ---- Converted from flag ---- ---- 03/06/2010 1:47 PM, Lisabeth Devoid RN wrote: [name] Dr. Shirlee Latch would like for you to call [name] who is in the [facility] now at [phone] to set up an appt and to start Coumadin on Monday.  He has hx of DVT & PE. Thanks Debbie ------------------------------  Phone Note Outgoing Call   Call placed by: Weston Brass PharmD,  March 06, 2010 2:15 PM Call placed to: RN at [facility] lab Summary of Call: Spoke with RN at [facility] lab.  Pt aware Rx sent in for Coumadin.  Appt set up for 10/24 at 3:30 with [facility]in Clinic.  Will need to stop aspirin when starts Coumadin per Dr. Shirlee Latch.  Initial call taken by: Weston Brass PharmD,  March 06, 2010 2:16 PM    New/Updated Medications: WARFARIN SODIUM 5 MG TABS (WARFARIN SODIUM) Use as directed by Anticoagulation Clinic Prescriptions: WARFARIN SODIUM 5 MG TABS (WARFARIN SODIUM) Use as directed by Anticoagulation Clinic  #30 x 1   Entered by:   Weston Brass PharmD   Authorized by:   Marca Ancona, MD   Signed by:   Weston Brass PharmD on 03/06/2010   Method used:   Electronically to        [facility] [phone]* (retail)       [address]       [place], [place]  [address]       Ph: [phone]       Fax: [phone]   RxID:   [id number]

## 2010-06-18 NOTE — Progress Notes (Signed)
Summary: unable to schedule myoview right now   ---- Converted from flag ---- ---- 01/31/2010 11:22 AM, Merita Norton Lloyd-Fate wrote: PT UNABLE TO HAVE TEST RIGHT NOW. PT INJURED HIS BACK. Merita Norton Lloyd-Fate  January 31, 2010 11:21 AM   ___Lexiscan ___Dobutamine ___Myocardial Viability-Thallium  Prioity___1(next day) ___2(one week) ___3 (prn)  ____1 Day Protocol ____2 Day Protocol  ---- 01/29/2010 3:53 PM, Merita Norton Lloyd-Fate wrote:   ---- 01/29/2010 12:39 PM, Claris Gladden RN wrote: The following orders have been entered for this patient and placed on Admin Hold:  Type:     Referral       Code:   Nuc Stress Test Description:   Nuclear Stress Test Order Date:   01/29/2010   Authorized By:   Marca Ancona, MD Order #:   [id number] Clinical Notes:   __x_Exercise Stress ___Adenosine ___Lexiscan ___Dobutamine ___Myocardial Viability-Thallium  Prioity___1(next day) ___2(one week) ___3 (prn)  ____1 Day Protocol ____2 Day Protocol ------------------------------

## 2010-06-18 NOTE — Assessment & Plan Note (Signed)
Summary: dizziness/high bp/ elavated heart rate  Medications Added DILTIAZEM HCL 120 MG TABS (DILTIAZEM HCL) 1 tab qd METOPROLOL SUCCINATE 100 MG XR24H-TAB (METOPROLOL SUCCINATE) Take one tablet by mouth daily AZOR 5-40 MG TABS (AMLODIPINE-OLMESARTAN) 1tabqd GLIPIZIDE 5 MG TABS (GLIPIZIDE) 1tabqd PROTONIX 40 MG TBEC (PANTOPRAZOLE SODIUM) 1tabqd FLOMAX 0.4 MG CAPS (TAMSULOSIN HCL) 1 tab qd HYDRALAZINE HCL 25 MG TABS (HYDRALAZINE HCL) 1 three times a day ASPIRIN 81 MG TBEC (ASPIRIN) Take one tablet by mouth daily      Allergies Added: NKDA  Visit Type:  Initial Consult Primary [place]:  Dr. Pecola Leisure  CC:  high blood pressure.  History of Present Illness: 65 yo with history of multiple DVTs, labile HTN, and diabetes presents for cardiology evaluation.  He reports difficult to control HTN.  However, he was recently started on Azor and reading have been better since then.  BP today is 119/79.  He gets a dizzy feeling when BP is high, but since starting Azor he has not really had any problems with this.  No chest pain.  He is able to walk 3 blocks without shortness of breath.  He is short of breath if he climbs a full flight of steps.  He has a long history of DVTs, most recently in 8/10, with resulting chronic venous insufficiency.  He has an IVC filter.  He says that he was told recently that he could stop his coumadin as he had been on it long enough post his most recent DVT.    ECG: NSR, LVH with repolarization abnormality.   Current Medications (verified): 1)  Diltiazem Hcl 120 Mg Tabs (Diltiazem Hcl) .Marland Kitchen.. 1 Tab Qd 2)  Metoprolol Succinate 100 Mg Xr24h-Tab (Metoprolol Succinate) .... Take One Tablet By Mouth Daily 3)  Azor 5-40 Mg Tabs (Amlodipine-Olmesartan) .Marland Kitchen.. 1tabqd 4)  Glipizide 5 Mg Tabs (Glipizide) .Marland Kitchen.. 1tabqd 5)  Protonix 40 Mg Tbec (Pantoprazole Sodium) .Marland Kitchen.. 1tabqd 6)  Flomax 0.4 Mg Caps (Tamsulosin Hcl) .Marland Kitchen.. 1 Tab Qd 7)  Hydralazine Hcl 25 Mg Tabs (Hydralazine Hcl) .Marland Kitchen.. 1tab  Qd 8)  Aspirin 81 Mg Tbec (Aspirin) .... Take One Tablet By Mouth Daily  Allergies (verified): No Known Drug Allergies  Past History:  Past Medical History: 1. 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  IVC filter placed in 2005.  Most recent DVT was in right leg in 8/11.   2.  Duodenal AVM with GI bleed 3.  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  Diabetes mellitus type II 5.  Smoking 6.  GERD 7.  BPH 8.  Low back pain 9.  HTN 10.  CAD: Left heart cath 2007 with 40-50% stenosis in a small RCA, EF 50%.   Family History: No premature CAD.   Social History: Smokes 1 ppd Part-time work as a Media planner, 1 son  Review of Systems       All systems reviewed and negative except as per HPI.   Vital Signs:  Patient profile:   66 year old male Height:      61 inches Weight:      201 pounds BMI:     38.12 Pulse (ortho):   77 / minute BP standing:   119 / 79 Cuff size:   large  Vitals Entered By: Burnett Kanaris, CNA (January 17, 2010 3:59 PM)  Serial Vital Signs/Assessments:  Time      Position  BP       Pulse  Resp  Temp  By      Lying LA  128/80   71                    Burnett Kanaris, CNA      Sitting   130/84   75                    Burnett Kanaris, CNA      Standing  119/79   77                    Burnett Kanaris, CNA      Standing  118/80   76                    Burnett Kanaris, CNA      Standing  123/81   77                    Burnett Kanaris, CNA  Comments: no dizziness  By: Burnett Kanaris, CNA  no dizziness By: Burnett Kanaris, CNA  pt does not complain of dizziness By: Burnett Kanaris, CNA    Physical Exam  General:  Well developed, well nourished, in no acute distress. Head:  normocephalic and atraumatic Nose:  no deformity, discharge, inflammation, or lesions Mouth:  Teeth, gums and palate normal. Oral mucosa normal. Neck:  Neck supple, no JVD. No masses, thyromegaly or abnormal  cervical nodes. Lungs:  Clear bilaterally to auscultation and percussion. Heart:  Non-displaced PMI, chest non-tender; regular rate and rhythm, S1, S2 without murmurs, rubs or gallops. Carotid upstroke normal, no bruit. 1+ edema to the knee on the right, 1+ edema 1/2 up the lower leg on the left.   Abdomen:  Bowel sounds positive; abdomen soft and non-tender without masses, organomegaly, or hernias noted. No hepatosplenomegaly. Extremities:  No clubbing or cyanosis. Neurologic:  Alert and oriented x 3. Skin:  Venous stasis changes lower legs bilaterally.  Psych:  Normal affect.   Impression & Recommendations:  Problem # 1:  HYPERTENSION, UNSPECIFIED (ICD-401.9) Patient's BP seems to be well-controlled on the current regimen.  He needs to take hydralazine three times a day rather than once a day. I will get an echo to assess LV function and for LVH.    Problem # 2:  VENOUS THROMBOEMBOLISM Patient has had multiple DVTs over the last 10 years.  He now has chronic venous insufficiency. He does have an IVC filter.  He recently stopped coumadin.  I think that he needs to restart coumadin as I think that his risk of a recurrent event is very high.  He will contact Dr. Pecola Leisure, who has followed his coumadin in the past.   Problem # 3:  SMOKING I counselled the patient to quit.  He will [facility] the smoking cessation classes at [facility].   I will check lipids/LFTs  Other Orders: Echocardiogram (Echo)  Patient Instructions: 1)  Your physician has requested that you have an echocardiogram.  Echocardiography is a painless test that uses sound waves to create images of your heart. It provides your doctor with information about the size and shape of your heart and how well your heart's chambers and valves are working.  This procedure takes approximately one hour. There are no restrictions for this procedure. 2)  Take Hydralazine 25mg  three times a day. 3)  Your physician recommends that you return for a  FASTING lipid profile-you can have  this done when you have the echocardigram. 414.01 4)  You should restart Coumadin--this can be monitored by your doctor. 5)  Your physician wants you to follow-up in: 6 months with Dr Shirlee Latch.   You will receive a reminder letter in the mail two months in advance. If you don't receive a letter, please call our office to schedule the follow-up appointment. Prescriptions: HYDRALAZINE HCL 25 MG TABS (HYDRALAZINE HCL) 1 three times a day  #90 x 11   Entered by:   Katina Dung, RN, BSN   Authorized by:   Marca Ancona, MD   Signed by:   Katina Dung, RN, BSN on 01/17/2010   Method used:   Electronically to        [facility] [phone]* (retail)       [address]       [place], [place]  [address]       Ph: [phone]       Fax: [phone]   RxID:   [id number]

## 2010-06-18 NOTE — Progress Notes (Signed)
Summary: test results/ fell last night   Phone Note Call from Patient Call back at [facility] Phone [phone]   Caller: Patient Summary of Call: test results Initial call taken by: Judie Grieve,  January 29, 2010 11:48 AM  Follow-up for Phone Call        lmfcb Timothy Gladden, RN BSN advised pt of results of echo and lipids. pt stated any day is good for stress test. will check schedule and adv pt. Timothy Gladden, RN  Per pt calling, pt  fell last night went by ems. wanted anne to know that stress test would have to wait [name]  January 30, 2010 4:18 PM   Additional Follow-up for Phone Call Additional follow up Details #1::        Dr Shirlee Latch is aware pt fell-he recommended a Lexiscan myoview instead of ETT myoview-I have called and left pt messages to call me back

## 2010-06-18 NOTE — Progress Notes (Signed)
Summary: pharmacy question/pt would like to talk to a nurse  Medications Added AMLODIPINE BESYLATE 5 MG TABS (AMLODIPINE BESYLATE) Take one tablet by mouth daily LOSARTAN POTASSIUM 100 MG TABS (LOSARTAN POTASSIUM) one by mouth daily HYDRALAZINE HCL 50 MG TABS (HYDRALAZINE HCL) 1 1/2 tabs three times a day ISORDIL TITRADOSE 40 MG TABS (ISOSORBIDE DINITRATE) one by mouth three times a day       Phone Note Call from Patient Call back at [facility] Phone [phone]   Caller: Patient Reason for Call: Talk to Nurse Summary of Call: per pt calling, pt checking on pharmacy meds.  Initial call taken by: Lorne Skeens,  March 19, 2010 11:10 AM  Follow-up for Phone Call        pt states he talked with Gaynelle Adu at [facility]--Robbie  is faxing form to authorize Azor and Bidil to  Dr Shirlee Latch --he states Dr Shirlee Latch should have the form by 1PM today Luana Shu   Additional Follow-up for Phone Call Additional follow up Details #1::        pt states office was going to recieve form re his meds. pt would like to talk to a nurse. #[phone] Additional Follow-up by: Roe Coombs,  March 19, 2010 4:24 PM    Additional Follow-up for Phone Call Additional follow up Details #2::    Spoke with patient and let him know we had not recieved any form for his medication.  However, I called [facility] and spoke with Rayna Sexton who transfered me to [place] she let me know that he had to try perferred medications before anything could be approved.  Dr Shirlee Latch spoke with patient (who was very upset) and let him know his insurance was requiring him to try these other medications first I called Amlodipine 5mg  daily, Losartan 100mg  daily, Hydralazine 50mg  1 1/2 three times a day, and Isordil 40mg  three times a day Dennis Bast, RN, BSN  March 19, 2010 6:07 PM  New/Updated Medications: AMLODIPINE BESYLATE 5 MG TABS (AMLODIPINE BESYLATE) Take one tablet by mouth daily LOSARTAN POTASSIUM 100 MG TABS (LOSARTAN  POTASSIUM) one by mouth daily HYDRALAZINE HCL 50 MG TABS (HYDRALAZINE HCL) 1 1/2 tabs three times a day ISORDIL TITRADOSE 40 MG TABS (ISOSORBIDE DINITRATE) one by mouth three times a day Prescriptions: ISORDIL TITRADOSE 40 MG TABS (ISOSORBIDE DINITRATE) one by mouth three times a day  #90 x 6   Entered by:   Dennis Bast, RN, BSN   Authorized by:   Marca Ancona, MD   Signed by:   Dennis Bast, RN, BSN on 03/19/2010   Method used:   Electronically to        [facility] [phone]* (retail)       [address]       [place], [place]  [address]       Ph: [phone]       Fax: [phone]   RxID:   [id number] HYDRALAZINE HCL 50 MG TABS (HYDRALAZINE HCL) 1 1/2 tabs three times a day  #145 x 6   Entered by:   Dennis Bast, RN, BSN   Authorized by:   Marca Ancona, MD   Signed by:   Dennis Bast, RN, BSN on 03/19/2010   Method used:   Electronically to        [facility] [phone]* (retail)       [address]       [place], [place]  [address]  Ph: [phone]       Fax: [phone]   RxID:   [id number] DGUYQIHK POTASSIUM 100 MG TABS (LOSARTAN POTASSIUM) one by mouth daily  #30 x 6   Entered by:   Dennis Bast, RN, BSN   Authorized by:   Marca Ancona, MD   Signed by:   Dennis Bast, RN, BSN on 03/19/2010   Method used:   Electronically to        [facility] [phone]* (retail)       [address]       [place], [place]  [address]       Ph: [phone]       Fax: [phone]   RxID:   [id number] AMLODIPINE BESYLATE 5 MG TABS (AMLODIPINE BESYLATE) Take one tablet by mouth daily  #30 x 6   Entered by:   Dennis Bast, RN, BSN   Authorized by:   Marca Ancona, MD   Signed by:   Dennis Bast, RN, BSN on 03/19/2010   Method used:   Electronically to        [facility] [phone]* (retail)       [address]       [place], [place]  [address]       Ph: [phone]       Fax: [phone]   RxID:   [id number]   Appended Document: pharmacy  question/pt would like to talk to a nurse I did receive  Medicare Oral Coverage Determination Request Form from [facility] for Azor and Bidil----Dr Shirlee Latch completed these forms. I faxed the completed forms to [facility] [phone] at 9:45am 03/20/10--pt is aware -(the completed forms have been forwarded to medical records to be scanned into EMR )

## 2010-06-18 NOTE — Progress Notes (Signed)
Summary: nuc pre procedure  Phone Note Outgoing Call Call back at Home Phone [phone]   Call placed by: Cathlyn Parsons RN,  February 18, 2010 3:24 PM Call placed to: Patient Reason for Call: Confirm/change Appt Summary of Call: Left message with information on Myoview Information Sheet (see scanned document for details).      Nuclear Med Background Indications for Stress Test: Evaluation for Ischemia   History: Echo, Heart Catheterization, Myocardial Infarction  History Comments: 07 Cath 40-50% small RCA and EF 50% 01/23/10 Echo EF 45% Hx of Pulm HTN  Symptoms: Dizziness, DOE    Nuclear Pre-Procedure Cardiac Risk Factors: Family History - CAD, Hypertension, Lipids, NIDDM, RBBB Height (in): 61

## 2010-06-18 NOTE — Cardiovascular Report (Signed)
Summary: Pre Cath Orders   Pre Cath Orders   Imported By: Roderic Ovens 03/06/2010 14:27:16  _____________________________________________________________________  External Attachment:    Type:   Image     Comment:   External Document

## 2010-07-05 ENCOUNTER — Emergency Department (HOSPITAL_COMMUNITY): Payer: Medicare Other

## 2010-07-05 ENCOUNTER — Inpatient Hospital Stay (HOSPITAL_COMMUNITY)
Admission: EM | Admit: 2010-07-05 | Discharge: 2010-07-08 | DRG: 292 | Disposition: A | Discharge status: Home Health | Payer: Medicare Other | Attending: Cardiology | Admitting: Cardiology

## 2010-07-05 DIAGNOSIS — Z86718 Personal history of other venous thrombosis and embolism: Secondary | ICD-10-CM

## 2010-07-05 DIAGNOSIS — E119 Type 2 diabetes mellitus without complications: Secondary | ICD-10-CM | POA: Diagnosis present

## 2010-07-05 DIAGNOSIS — I509 Heart failure, unspecified: Secondary | ICD-10-CM | POA: Diagnosis present

## 2010-07-05 DIAGNOSIS — I872 Venous insufficiency (chronic) (peripheral): Secondary | ICD-10-CM | POA: Diagnosis present

## 2010-07-05 DIAGNOSIS — I5023 Acute on chronic systolic (congestive) heart failure: Secondary | ICD-10-CM

## 2010-07-05 DIAGNOSIS — K219 Gastro-esophageal reflux disease without esophagitis: Secondary | ICD-10-CM | POA: Diagnosis present

## 2010-07-05 DIAGNOSIS — E785 Hyperlipidemia, unspecified: Secondary | ICD-10-CM | POA: Diagnosis present

## 2010-07-05 DIAGNOSIS — F17200 Nicotine dependence, unspecified, uncomplicated: Secondary | ICD-10-CM | POA: Diagnosis present

## 2010-07-05 DIAGNOSIS — Z7982 Long term (current) use of aspirin: Secondary | ICD-10-CM

## 2010-07-05 DIAGNOSIS — I129 Hypertensive chronic kidney disease with stage 1 through stage 4 chronic kidney disease, or unspecified chronic kidney disease: Secondary | ICD-10-CM | POA: Diagnosis present

## 2010-07-05 DIAGNOSIS — N189 Chronic kidney disease, unspecified: Secondary | ICD-10-CM | POA: Diagnosis present

## 2010-07-05 DIAGNOSIS — Z7901 Long term (current) use of anticoagulants: Secondary | ICD-10-CM

## 2010-07-05 DIAGNOSIS — R609 Edema, unspecified: Secondary | ICD-10-CM

## 2010-07-05 DIAGNOSIS — Z833 Family history of diabetes mellitus: Secondary | ICD-10-CM

## 2010-07-05 DIAGNOSIS — I428 Other cardiomyopathies: Secondary | ICD-10-CM | POA: Diagnosis present

## 2010-07-05 DIAGNOSIS — I5041 Acute combined systolic (congestive) and diastolic (congestive) heart failure: Principal | ICD-10-CM | POA: Diagnosis present

## 2010-07-05 LAB — URINALYSIS, ROUTINE W REFLEX MICROSCOPIC
Hgb urine dipstick: NEGATIVE
Specific Gravity, Urine: 1.03 (ref 1.005–1.030)
Urine Glucose, Fasting: >1000 mg/dL — AB
Urobilinogen, UA: 2 mg/dL — ABNORMAL HIGH (ref 0.0–1.0)

## 2010-07-05 LAB — CBC
HCT: 44 % (ref 39.0–52.0)
Hemoglobin: 14.2 g/dL (ref 13.0–17.0)
MCH: 25.2 pg — ABNORMAL LOW (ref 26.0–34.0)
MCV: 78.2 fL (ref 78.0–100.0)
Platelets: 149 10*3/uL — ABNORMAL LOW (ref 150–400)
RBC: 5.63 MIL/uL (ref 4.22–5.81)
WBC: 6 10*3/uL (ref 4.0–10.5)

## 2010-07-05 LAB — BASIC METABOLIC PANEL
BUN: 21 mg/dL (ref 6–23)
CO2: 26 mEq/L (ref 19–32)
Calcium: 9.1 mg/dL (ref 8.4–10.5)
GFR calc non Af Amer: 39 mL/min — ABNORMAL LOW (ref >60)
Glucose, Bld: 291 mg/dL — ABNORMAL HIGH (ref 70–99)
Potassium: 4.3 mEq/L (ref 3.5–5.1)

## 2010-07-05 LAB — DIFFERENTIAL
Eosinophils Absolute: 0.1 10*3/uL (ref 0.0–0.7)
Lymphocytes Relative: 26 % (ref 12–46)
Lymphs Abs: 1.5 10*3/uL (ref 0.7–4.0)
Monocytes Relative: 12 % (ref 3–12)
Neutrophils Relative %: 59 % (ref 43–77)

## 2010-07-05 LAB — POCT CARDIAC MARKERS
CKMB, poc: 6.6 ng/mL (ref 1.0–8.0)
Myoglobin, poc: 131 ng/mL (ref 12–200)

## 2010-07-05 LAB — MRSA PCR SCREENING: MRSA by PCR: POSITIVE — AB

## 2010-07-05 LAB — URINE MICROSCOPIC-ADD ON

## 2010-07-05 LAB — PROTIME-INR: Prothrombin Time: 14.5 seconds (ref 11.6–15.2)

## 2010-07-06 DIAGNOSIS — I5023 Acute on chronic systolic (congestive) heart failure: Secondary | ICD-10-CM

## 2010-07-06 LAB — CARDIAC PANEL(CRET KIN+CKTOT+MB+TROPI)
CK, MB: 8.9 ng/mL (ref 0.3–4.0)
CK, MB: 7.4 ng/mL (ref 0.3–4.0)
Relative Index: 2 (ref 0.0–2.5)
Total CK: 442 U/L — ABNORMAL HIGH (ref 7–232)
Troponin I: 0.05 ng/mL (ref 0.00–0.06)

## 2010-07-06 LAB — CBC
Hemoglobin: 13.7 g/dL (ref 13.0–17.0)
MCH: 25.7 pg — ABNORMAL LOW (ref 26.0–34.0)
Platelets: 149 10*3/uL — ABNORMAL LOW (ref 150–400)
RBC: 5.33 MIL/uL (ref 4.22–5.81)
WBC: 5.4 10*3/uL (ref 4.0–10.5)

## 2010-07-06 LAB — DIFFERENTIAL
Basophils Absolute: 0 10*3/uL (ref 0.0–0.1)
Basophils Relative: 1 % (ref 0–1)
Eosinophils Absolute: 0.2 10*3/uL (ref 0.0–0.7)
Monocytes Relative: 14 % — ABNORMAL HIGH (ref 3–12)
Neutro Abs: 3.1 10*3/uL (ref 1.7–7.7)
Neutrophils Relative %: 57 % (ref 43–77)

## 2010-07-06 LAB — BASIC METABOLIC PANEL
BUN: 23 mg/dL (ref 6–23)
CO2: 26 mEq/L (ref 19–32)
Chloride: 100 mEq/L (ref 96–112)
Chloride: 102 mEq/L (ref 96–112)
GFR calc Af Amer: 44 mL/min — ABNORMAL LOW (ref >60)
GFR calc non Af Amer: 36 mL/min — ABNORMAL LOW (ref >60)
Glucose, Bld: 193 mg/dL — ABNORMAL HIGH (ref 70–99)
Potassium: 4 mEq/L (ref 3.5–5.1)
Potassium: 3.6 mEq/L (ref 3.5–5.1)
Sodium: 137 mEq/L (ref 135–145)
Sodium: 140 mEq/L (ref 135–145)

## 2010-07-06 LAB — GLUCOSE, CAPILLARY
Glucose-Capillary: 353 mg/dL — ABNORMAL HIGH (ref 70–99)
Glucose-Capillary: 197 mg/dL — ABNORMAL HIGH (ref 70–99)
Glucose-Capillary: 194 mg/dL — ABNORMAL HIGH (ref 70–99)

## 2010-07-06 LAB — APTT: aPTT: 27 seconds (ref 24–37)

## 2010-07-06 LAB — PROTIME-INR: INR: 1.08 (ref 0.00–1.49)

## 2010-07-06 LAB — BRAIN NATRIURETIC PEPTIDE: Pro B Natriuretic peptide (BNP): 867 pg/mL — ABNORMAL HIGH (ref 0.0–100.0)

## 2010-07-07 DIAGNOSIS — I059 Rheumatic mitral valve disease, unspecified: Secondary | ICD-10-CM

## 2010-07-07 LAB — GLUCOSE, CAPILLARY
Glucose-Capillary: 211 mg/dL — ABNORMAL HIGH (ref 70–99)
Glucose-Capillary: 341 mg/dL — ABNORMAL HIGH (ref 70–99)
Glucose-Capillary: 237 mg/dL — ABNORMAL HIGH (ref 70–99)

## 2010-07-07 LAB — PROTIME-INR
INR: 1.12 (ref 0.00–1.49)
Prothrombin Time: 14.6 seconds (ref 11.6–15.2)

## 2010-07-07 LAB — CBC
HCT: 41.2 % (ref 39.0–52.0)
Hemoglobin: 13.3 g/dL (ref 13.0–17.0)
RBC: 5.27 MIL/uL (ref 4.22–5.81)

## 2010-07-07 LAB — BASIC METABOLIC PANEL
BUN: 19 mg/dL (ref 6–23)
CO2: 29 mEq/L (ref 19–32)
Chloride: 99 mEq/L (ref 96–112)
Chloride: 102 mEq/L (ref 96–112)
GFR calc Af Amer: 44 mL/min — ABNORMAL LOW (ref >60)
Potassium: 3.1 mEq/L — ABNORMAL LOW (ref 3.5–5.1)
Potassium: 3.9 mEq/L (ref 3.5–5.1)
Sodium: 140 mEq/L (ref 135–145)

## 2010-07-08 ENCOUNTER — Telehealth: Payer: Self-pay | Admitting: Cardiology

## 2010-07-08 DIAGNOSIS — I5043 Acute on chronic combined systolic (congestive) and diastolic (congestive) heart failure: Secondary | ICD-10-CM

## 2010-07-08 DIAGNOSIS — R0602 Shortness of breath: Secondary | ICD-10-CM

## 2010-07-08 LAB — BASIC METABOLIC PANEL
BUN: 17 mg/dL (ref 6–23)
Calcium: 8.4 mg/dL (ref 8.4–10.5)
Creatinine, Ser: 1.67 mg/dL — ABNORMAL HIGH (ref 0.4–1.5)
GFR calc non Af Amer: 42 mL/min — ABNORMAL LOW (ref >60)
Glucose, Bld: 197 mg/dL — ABNORMAL HIGH (ref 70–99)
Potassium: 3.3 mEq/L — ABNORMAL LOW (ref 3.5–5.1)

## 2010-07-08 LAB — MAGNESIUM: Magnesium: 1.7 mg/dL (ref 1.5–2.5)

## 2010-07-08 LAB — PROTIME-INR
INR: 1.21 (ref 0.00–1.49)
Prothrombin Time: 15.5 seconds — ABNORMAL HIGH (ref 11.6–15.2)

## 2010-07-09 ENCOUNTER — Telehealth: Payer: Self-pay | Admitting: Cardiology

## 2010-07-10 ENCOUNTER — Telehealth: Payer: Self-pay | Admitting: Cardiology

## 2010-07-10 ENCOUNTER — Encounter: Payer: Self-pay | Admitting: Cardiology

## 2010-07-11 ENCOUNTER — Encounter: Payer: Self-pay | Admitting: Internal Medicine

## 2010-07-11 ENCOUNTER — Encounter (INDEPENDENT_AMBULATORY_CARE_PROVIDER_SITE_OTHER): Payer: Medicare Other

## 2010-07-11 ENCOUNTER — Encounter: Payer: Self-pay | Admitting: Cardiology

## 2010-07-11 ENCOUNTER — Encounter (INDEPENDENT_AMBULATORY_CARE_PROVIDER_SITE_OTHER): Payer: Medicare Other | Admitting: Cardiology

## 2010-07-11 ENCOUNTER — Other Ambulatory Visit: Payer: Self-pay | Admitting: Cardiology

## 2010-07-11 DIAGNOSIS — I80299 Phlebitis and thrombophlebitis of other deep vessels of unspecified lower extremity: Secondary | ICD-10-CM

## 2010-07-11 DIAGNOSIS — Z7901 Long term (current) use of anticoagulants: Secondary | ICD-10-CM

## 2010-07-11 DIAGNOSIS — I5022 Chronic systolic (congestive) heart failure: Secondary | ICD-10-CM | POA: Insufficient documentation

## 2010-07-11 DIAGNOSIS — I509 Heart failure, unspecified: Secondary | ICD-10-CM

## 2010-07-11 DIAGNOSIS — I5043 Acute on chronic combined systolic (congestive) and diastolic (congestive) heart failure: Secondary | ICD-10-CM | POA: Insufficient documentation

## 2010-07-11 LAB — BASIC METABOLIC PANEL
BUN: 20 mg/dL (ref 6–23)
CO2: 27 mEq/L (ref 19–32)
Calcium: 8.7 mg/dL (ref 8.4–10.5)
Creatinine, Ser: 1.4 mg/dL (ref 0.4–1.5)
GFR: 64.36 mL/min (ref >60.00)
Glucose, Bld: 248 mg/dL — ABNORMAL HIGH (ref 70–99)

## 2010-07-12 ENCOUNTER — Telehealth: Payer: Self-pay | Admitting: Cardiology

## 2010-07-12 NOTE — Discharge Summary (Signed)
NAMEARTEMIS, [name]               ACCOUNT NO.:  [id number]  MEDICAL RECORD NO.:  [id number]           PATIENT TYPE:  I  LOCATION:  2923                         FACILITY:  [facility]  PHYSICIAN:  Marca Ancona, MD      DATE OF BIRTH:  14-May-1946  DATE OF ADMISSION:  07/05/2010 DATE OF DISCHARGE:  07/08/2010                              DISCHARGE SUMMARY   PRIMARY CARDIOLOGIST:  Marca Ancona, MD  PRIMARY CARE PHYSICIAN:  Betti D. Pecola Leisure, MD  DISCHARGE DIAGNOSES: 1. 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     a.     A 2-D echocardiogram on July 07, 2010:  Left ventricular      cavity size normal, moderate left ventricular hypertrophy,      systolic function moderately to severely reduced, left ventricular      ejection fraction 30-35%, diffuse hypokinesis, akinesis of the mid-      distal anteroseptal and apical myocardium (grade 1 diastolic      dysfunction), mild mitral regurgitation. 2. History of deep venous thromboses, 2004, 2005, 2007, 2010, August     2011.     a.     Status post inferior vena cava filter, 2005.     b.     History of intermittent anticoagulation on Coumadin,      discontinued secondary to gastrointestinal bleed 2 years ago,      resumed in October 2011, but poor compliance, reinitiated this      admission. 3. Ongoing tobacco abuse (47 pack years, one pack per day currently). 4. Non-insulin-dependent diabetes mellitus.     a.     Poorly controlled this admission.  The patient is instructed      to follow up as soon as possible with his primary care provider      for likely initiation of insulin therapy.  SECONDARY DIAGNOSES: 1. Chronic venous insufficiency secondary to deep venous thromboses,     right greater than left. 2. Chronic dyspnea.     a.     Positive Myoview, 2011.     b.     Normal coronary arteries per cath October 2011. 3. Gastroesophageal reflux disease. 4. Benign prostatic  hypertrophy. 5. Back pain. 6. Hypertension. 7. Hyperlipidemia. 8. History of lactose intolerance.  ALLERGIES:  No known drug allergies.  PROCEDURES: 1. EKG, July 05, 2010:  Normal sinus rhythm, 90 bpm, normal axis,     anterolateral ST flattening, T-wave inversion, old. 2. Chest x-ray, July 05, 2010:  Small bilateral pleural effusions     with adjacent density consistent with compressive atelectasis. 3. A 2-D echocardiogram, July 07, 2010:  Please see discharge     diagnosis section #1, subsection a. 4. Lower extremity Dopplers, July 07, 2010:  No new or acute deep     venous thrombosis or Baker cyst noted in bilateral lower     extremities.  HISTORY OF PRESENT ILLNESS:  [name] is a 65 year old African American male with the above-noted complex medical history who presented to [facility] after a flu-like symptoms approximately 10-14 days prior to presentation, low-grade  fevers, myalgias, and intermittent diarrhea leading to increased fluid intake orally and unfortunately, also increasing dyspnea on exertion, and lower extremity edema.  He also noted PND but denies orthopnea.  He reported increased abdominal girth but feels if anything he has actually lost weight. When evaluated by Cardiology in the emergency department, he denied chest discomfort any time and showed clear volume overload on exam.  HOSPITAL COURSE:  The patient was admitted and meds were adjusted to decrease afterload with the initiation of IV nitroglycerin that was up titrated as tolerated to 150 mcg per minute by the date of discharge. Other meds were adjusted (see discharge med list for final discharge meds).  The patient had lower extremity Dopplers, and a 2-D echocardiogram completed this admission.  While Dr. Shirlee Latch felt that the patient actually should be kept for another 24 hours after he evaluated him on July 08, 2010, the patient insisted on leaving and it took all of the  bedside manner accumulated over Dr. Alford Highland career to convince the patient to wait a few hours in order for his IV nitroglycerin to be weaned off.  The patient apparently had some pressing private matters that need to be taken care of and actually signed all the AMA paperwork, but the patient's followup appointments and medications were able to be completed prior to his leaving the hospital.  It is noteworthy that the patient's weight on admission was documented at 96.4 kg, last check by a different scale 88.0 kg.  BNP on admission 950, rechecked the following day at 867.  Renal function improved from a peak creatinine on day following admission of 1.89 to 1.67 on date of discharge.  BUN ranged from 17 on date of discharge to 23 on the day following his admission.  The patient did state that he was feeling much better as he walked out of the hospital.  The patient was reinitiated on Coumadin this admission and has been set up for a Coumadin Clinic appointment just after his followup appointment already scheduled with his primary cardiologist, [facility], this Thursday, July 11, 2010, at 12:30 p.m.  DISCHARGE LABORATORY DATA:  WBC 7.7, HGB 13.3, HCT 41.2, PLT count is 178.  Pro time 15.5, INR 1.21.  Glucose ranged from 139 to 341 this admission.  Sodium 136, potassium 3.3, chloride 100, bicarb 27, BUN 17, creatinine 1.67, glucose 197, magnesium 1.7.  Point-of-care markers negative.  First full set of enzymes:  CK 442, MB 8.9, troponin 0.05. Second full set:  CK 452, MB 7.4, troponin I 0.05.  BNP 950, recheck 867.  Urinalysis, amber with a greater than 1000 glucose, a small amount of bilirubin, 100 protein, 2.0 urobilinogen.  MRSA positive.  FOLLOWUP PLANS AND APPOINTMENTS:  Please see hospital course.  DISCHARGE MEDICATIONS: 1. Amlodipine 5 mg p.o. daily. 2. Furosemide 40 mg p.o. daily. 3. BiDil 20/37.5 two tablets p.o. q.8 h. 4. Metoprolol succinate 100  mg 2 tablets p.o. daily. 5. Benicar 10 mg p.o. daily. 6. Warfarin 5 mg p.o. daily. 7. Avodart 0.5 mg one tablet p.o. daily. 8. Glipizide 5 mg p.o. b.i.d. 9. Protonix 40 mg one tablet p.o. daily. 10.Simvastatin 40 mg p.o. nightly.  DURATION OF DISCHARGE ENCOUNTER:  Including physician time was 35 minutes.     Jarrett Ables, PAC   ______________________________ Marca Ancona, MD    MS/MEDQ  D:  07/08/2010  T:  07/09/2010  Job:  [id number]  cc:   Jocelyn Lamer D. Pecola Leisure, M.D.  Electronically Signed by  [name] PAC on 07/09/2010 01:14:59 PM Electronically Signed by Marca Ancona MD on 07/12/2010 11:02:50 AM

## 2010-07-12 NOTE — H&P (Signed)
[name], [name]               ACCOUNT NO.:  [id number]  MEDICAL RECORD NO.:  [id number]           PATIENT TYPE:  I  LOCATION:  2923                         FACILITY:  [facility]  PHYSICIAN:  Marca Ancona, MD      DATE OF BIRTH:  17-May-1946  DATE OF ADMISSION:  07/05/2010 DATE OF DISCHARGE:                             HISTORY & PHYSICAL   PRIMARY CARDIOLOGIST:  Marca Ancona, MD  PRIMARY CARE PROVIDER:  Isidoro Donning. Pecola Leisure, MD  PATIENT PROFILE:  A 65 year old African American male with history of multiple DVTs, status post IVC filter as well as chronic dyspnea which has progressed over the past week associated with increasing abdominal girth and lower extremity edema, prompting ER visit. 1. Acute on chronic systolic congestive heart failure/nonischemic     cardiomyopathy.     a.     January 23, 2010, 2-D echocardiogram EF 45% mildly dilated      left atrium. 2. 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     a.     Status post IVC filter 2005.     b.     Previously on Coumadin, but discontinued secondary to GI      bleed approximately 2 years ago, resumed in October 2011, but then      the patient never refilled prescription. 3. Chronic venous insufficiency secondary to the DVTs right greater     than left. 4. Chronic dyspnea.     a.     Positive Myoview 2011.     b.     Normal course by catheterization October 2011. 5. Type 2 diabetes mellitus. 6. Ongoing tobacco abuse with a 47 pack-year history smoking one pack     per day. 7. GERD. 8. BPH. 9. Low back pain. 10.Hypertension. 11.Hyperlipidemia. 12.Lactose intolerance.  ALLERGIES:  No known drug allergies.  HISTORY OF PRESENT ILLNESS:  A 65 year old male with the above complex problem list.  Approximately 10-14 days ago, the patient noted flu-like symptoms with low-grade fevers, myalgias and intermittent diarrhea.  He increased his fluid intake orally and since has noted increasing  dyspnea on exertion now with something as simple as tying his shoes, along with PND.  There were no orthopnea.  He also notes increased abdominal girth, and lower extremity edema.  The patient says his weight prior to the flu- like symptoms was in the 210-pound range and is currently 197.  In the ED, he had significant volume overload.  He has had no chest pain throughout the past few weeks.  HOME MEDICATIONS: 1. Aspirin 81 mg daily. 2. Avodart 0.5 mg daily. 3. BiDil 20/37.5 mg t.i.d. 4. Glipizide 5 mg daily. 5. Protonix 40 mg daily. 6. Simvastatin 40 mg daily. 7. Azor 5-40 mg daily. 8. Toprol-XL 150 mg daily.  FAMILY HISTORY:  Mother died of stroke in her 36s.  Father died at 72 from traumatic head injury in the fall.  He has a total of 4 sisters and 3 brothers, 3 sisters are living, 2 brothers are living.  There is diabetes in multiple siblings.  He is not aware of any history of stroke and coronary disease.  SOCIAL HISTORY:  The patient lives in [place] by himself, although a girlfriend lives in the same apartment building.  He is retired.  He is a 47 pack-year history of tobacco abuse currently smoking one pack per day.  He denies alcohol or drug use.  He tries to walk 3-4 miles per day, but has not done this in about 3 weeks.  REVIEW OF SYSTEMS:  Positive for intermittent fevers, chills, weakness, myalgias and arthralgias last week.  He has chronic dyspnea on exertion which is worsened over the past 10 days.  He has chronic lower extremity edema which is acutely worse.  He has intermittent constipation and diarrhea.  He has diabetes.  He is a full code.  Otherwise, all systems reviewed and negative.  PHYSICAL EXAMINATION:  VITAL SIGNS:  He is afebrile, heart rate 98, respirations 18, blood pressure 168/109, pulse ox 97% on room air. GENERAL:  Pleasant African male, in no acute distress.  Awake and alert x3.  He has a normal affect. HEENT:  Normal nares, grossly intact  nonfocal. SKIN:  Warm and dry without lesions or masses. NECK:  Supple with JVP to the jaw.  No bruits. LUNGS:  Respirations are unlabored.  Clear to auscultation. CARDIAC:  Regular S1 and S2.  No S3, S4, or murmurs. ABDOMEN:  Obese and firm.  It is nontender.  Bowel sounds present x4. EXTREMITIES:  Warm and dry.  No clubbing or cyanosis, 2+ bilateral lower extremity edema right greater than left.  Distal pulses are 1+ bilaterally.  Chest x-ray shows small bilateral pleural effusions with adjacent density consistent with compressive atelectasis.  EKG shows sinus rhythm rate of 90 normal axis.  He is anterolateral ST flattening T-wave inversion which is old.  Hemoglobin 14.2, hematocrit 44.0, WBC 6.0, platelets 149.  Sodium 136, potassium 4.3, chloride 101, CO2 26, BUN 21, creatinine 1.77, glucose 291, BNP 950, CK-troponin I less than 0.05. Urinalysis is negative.  ASSESSMENT AND PLAN: 1. Acute on chronic systolic and possibly some component of diastolic     heart failure/nonischemic cardiomyopathy.  The patient with marked     volume overload despite apparent/reported weight loss.  The patient     does report compliance with his home medications, though does have     a frequent fast food restaurant and also regularly enjoys healthy     snacks like chips.  Plan to admit and diurese IV Lasix and also had     nitroglycerin infusion for better blood pressure management.  We     will hold his ARB given mild elevation of creatinine 1.77 and     follow closely.  We will repeat 2-D echocardiogram as despite     modest LV dysfunction in the past.  The patient has never had     volume like this.  Continue home beta-blocker. 2. History of deep venous thrombosis.  The patient has bilateral lower     extremity edema which is acute on chronic at this point.  He does     have an IVC filter.  He was resumed on Coumadin in the last fall,     but sounds like he only took about 30 days and then never  refilled     the prescription.  We will go ahead and resume Coumadin now.  We     will also place him on DVT prophylaxis.  I obtained a lower  extremity ultrasound to assess for DVT.  If there is an acute DVT,     we may consider working up PE and more formally IV anticoagulating. 3. Acute on chronic kidney disease, creatinine is elevated at 1.77.     Follow with diuresis and hold ARB for the time being. 4. Hypertension.  Blood pressure was markedly elevated.  Continue to     follow on hydralazine and IV nitro as well as diuresis. 5. Diabetes mellitus.  Add sliding scale insulin.  Continue home dose     of glipizide. 6. Ongoing tobacco abuse.  Smoking cessation is strongly advised.     Nicolasa Ducking, ANP   ______________________________ Marca Ancona, MD    CB/MEDQ  D:  07/05/2010  T:  07/06/2010  Job:  [id number]  Electronically Signed by Nicolasa Ducking ANP on 07/10/2010 02:07:01 PM Electronically Signed by Marca Ancona MD on 07/12/2010 11:02:47 AM

## 2010-07-16 NOTE — Assessment & Plan Note (Signed)
Summary: eph/per [name]  Medications Added GLIPIZIDE 5 MG TABS (GLIPIZIDE) Take 1 tablet by mouth two times a day LIPITOR 20 MG TABS (ATORVASTATIN CALCIUM) one daily WARFARIN SODIUM 5 MG TABS (WARFARIN SODIUM) one daily or as directed METOPROLOL SUCCINATE 100 MG XR24H-TAB (METOPROLOL SUCCINATE) Take 1 tablet by mouth two times a day FUROSEMIDE 40 MG TABS (FUROSEMIDE) Take one tablet by mouth daily. BIDIL 20-37.5 MG TABS (ISOSORB DINITRATE-HYDRALAZINE) take 2 tablets every 8 hours BENICAR 20 MG TABS (OLMESARTAN MEDOXOMIL) one-half tablet daily      Allergies Added: NKDA  Visit Type:  Post-hospital Primary Provider:  Dr. Pecola Leisure  CC:  No complaints.  History of Present Illness: 65 yo with history of multiple DVTs, labile HTN, and diabetes returns for cardiology followup.   He has a long history of DVTs, most recently in 8/10, with resulting chronic venous insufficiency.  He has an IVC filter.  He had a workup this past fall for exertional dypsnea showing mild nonischemic cardiomyopathy with no coronary disease on cath and EF 45% on echo.    Patient was admitted on 07/05/10 with about 10 days of steadily worsening exertional dyspnea.  No definite trigger.  He reports medication compliance.  He was noted to be very volume overloaded and was admitted and diuresed.  Echo showed worse LV systolic function compared to the prior study last fall.  EF was 30-35% with moderate LVH.  Ultrasound in the hospital showed no recurrent lower extremity DVT.  SInce discharge he has not been taking all of his medications (has not yet picked them up from [employer]).  BP is ok today.  Also of note, patient has smoked only 1 cigarette since discharge.  Post-discharge, he denies any significant exertional dyspnea (much improved).  No orthopnea/PND.    ECG: NSR, LV hypertrophy  Labs (8/11): creatinine 1.4 Labs (9/11): LDL 130, HDL 37 Labs (10/11): K 4.0, creatinine 1.5 Labs (2/12): K 3.3, creatinine 1.67  Current  Medications (verified): 1)  Glipizide 5 Mg Tabs (Glipizide) .... Take 1 Tablet By Mouth Two Times A Day 2)  Protonix 40 Mg Tbec (Pantoprazole Sodium) .Marland Kitchen.. 1tabqd 3)  Avodart 0.5 Mg Caps (Dutasteride) .Marland Kitchen.. 1 By Mouth Daily 4)  Simvastatin 40 Mg Tabs (Simvastatin) .... One in The Evening 5)  Oxycodone-Acetaminophen 5-325 Mg Tabs (Oxycodone-Acetaminophen) .... As Needed 6)  Warfarin Sodium 3 Mg Tabs (Warfarin Sodium) .... One Daily or As Directed 7)  Amlodipine Besylate 5 Mg Tabs (Amlodipine Besylate) .... Take One Tablet By Mouth Daily 8)  Metoprolol Succinate 100 Mg Xr24h-Tab (Metoprolol Succinate) .... Take 1 Tablet By Mouth Two Times A Day 9)  Furosemide 40 Mg Tabs (Furosemide) .... Take One Tablet By Mouth Daily. 10)  Bidil 20-37.5 Mg Tabs (Isosorb Dinitrate-Hydralazine) .... Take 2 Tablets Every 8 Hours  Allergies (verified): No Known Drug Allergies  Past History:  Past Medical History: 1. 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  IVC filter placed in 2005.  Most recent DVT was in right leg in 8/11.  Now back on coumadin.  2.  Duodenal AVM with GI bleed 3.  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  Diabetes mellitus type II 5.  Smoking 6.  GERD 7.  BPH 8.  Low back pain 9.  HTN 10.  CAD: Left heart cath 2007 with 40-50% stenosis in a small RCA, EF 50%.  Echo (9/11): Septal and apical akinesis, dilated LV, EF 45%, RV normal size and systolic function. Lexiscan myoview (10/11): EF 43%,  global hypokinesis, possible small area of apical ischemia.   LHC (10/11): No angiographic CAD.  No LV-gram done due to CKD.  11.  Hyperlipidemia 12.  Nonischemic CMP: Echo (9/11) with septal and apical akinesis, dilated LV, EF 45%, RV normal size and systolic function.  EF 43% on myoview.  Patient was admitted with decompensated CHF in 2/12.  Echo (2/12) showed EF 30-35% with diffuse hypokinesis but akinesis of the mid to apical anteroseptal wall and apex, moderate LVH, mild MR,  grade I diastolic dysfunction.   Family History: Reviewed history from 01/17/2010 and no changes required. No premature CAD.   Social History: Reviewed history from 01/17/2010 and no changes required. Smokes 1 ppd No ETOH.  Part-time work as a Media planner, 1 son  Review of Systems       All systems reviewed and negative except as per HPI.   Vital Signs:  Patient profile:   65 year old male Height:      72 inches Weight:      200.25 pounds BMI:     27.26 Pulse rate:   101 / minute Pulse rhythm:   regular Resp:     18 per minute BP sitting:   118 / 86  (right arm) Cuff size:   large  Vitals Entered By: Vikki Ports (July 11, 2010 12:23 PM)  Physical Exam  General:  Well developed, well nourished, in no acute distress.  Obese.  Neck:  Neck supple, JVP 8 cm. No masses, thyromegaly or abnormal cervical nodes. Lungs:  Clear bilaterally to auscultation and percussion. Heart:  Non-displaced PMI, chest non-tender; regular rate and rhythm, S1, S2 without murmurs, rubs or gallops. Carotid upstroke normal, no bruit. 1+ edema to the knee on the right, 1+ edema 1/2 up the lower leg on the left.   Abdomen:  Bowel sounds positive; abdomen soft and non-tender without masses, organomegaly, or hernias noted. No hepatosplenomegaly. Extremities:  No clubbing or cyanosis. Neurologic:  Alert and oriented x 3. Psych:  Normal affect.   Impression & Recommendations:  Problem # 1:  SYSTOLIC HEART FAILURE, CHRONIC (ICD-428.22) Patient was recently admitted with acute systolic CHF exacerbation.  EF was 30-35% with moderate LV hypertrophy.  It looked worse than the prior echo in 10/11.  He had no significant coronary disease on a cath in the fall.  I am uncertain what triggered his decompensation and worsening of LV systolic function.   - Lab workup for cardiomyopathy: Will get HIV test, SPEP, UPEP (? amyloidosis).  - I will consider a cardiac MRI in the future.  - Continue Toprol  XL 200 mg daily, Bidil 2 tabs three times a day, and olmesartan 10 mg daily.  - BMET today.  - Echo in 8/12 after 6 months of medical therarpy to look for improvement.   Problem # 2:  HYPERLIPIDEMIA-MIXED (ICD-272.4) Needs lipids/LFTs at next appointment.    Problem # 3:  HISTORY OF DVTS AND PES Patient has started on coumadin, which should be life-long.   Other Orders: T-HIV Antibody  (Reflex) ([id number]) T-Prot. Elect. (Serum) [phone]) T- * Misc. Laboratory test [phone]) Echocardiogram (Echo) TLB-BMP (Basic Metabolic Panel-BMET) (80048-METABOL)  Patient Instructions: 1)  Your physician recommends that you have lab today---UPEP/SPEP/BMP/HIV 428.0 2)  Change Simvastatin to Lipitor 20mg  daily. 3)  Your physician recommends that you schedule a follow-up appointment in: 2 weeks with Dr Shirlee Latch. 4)  Your physician has requested that you have an echocardiogram.  Echocardiography is a painless test that uses  sound waves to create images of your heart. It provides your doctor with information about the size and shape of your heart and how well your heart's chambers and valves are working.  This procedure takes approximately one hour. There are no restrictions for this procedure. AUGUST 2012 Prescriptions: LIPITOR 20 MG TABS (ATORVASTATIN CALCIUM) one daily  #30 x 11   Entered by:   Katina Dung, RN, BSN   Authorized by:   Marca Ancona, MD   Signed by:   Katina Dung, RN, BSN on 07/11/2010   Method used:   Electronically to        [facility] [phone]* (retail)       [address]       [place], [place]  [address]       Ph: [phone]       Fax: [phone]   RxID:   [id number] BENICAR 20 MG TABS (OLMESARTAN MEDOXOMIL) one-half tablet daily  #15 x 11   Entered by:   Katina Dung, RN, BSN   Authorized by:   Marca Ancona, MD   Signed by:   Katina Dung, RN, BSN on 07/11/2010   Method used:   Electronically to        [facility] [phone]* (retail)        [address]       [place], [place]  [address]       Ph: [phone]       Fax: [phone]   RxID:   [id number]

## 2010-07-16 NOTE — Progress Notes (Signed)
Summary: referral - social worker- drug interaction-left message   Phone Note From Other Clinic   Caller: tonya from [facility] - [phone] Request: Talk with Nurse Summary of Call: level one drug interaction. also Child psychotherapist referral.  Initial call taken by: Lorne Skeens,  July 09, 2010 1:25 PM  Follow-up for Phone Call        left message to call back. Follow-up by: Dossie Arbour, RN, BSN,  July 09, 2010 2:02 PM  Additional Follow-up for Phone Call Additional follow up Details #1::        Spoke with Tonya. She is calling to report level one drug interaction --Simvastatin 40 mg and Amlodipine 5 mg. Will forward to Dr. Shirlee Latch to review.  Also requesting order for social worker consult to help identify community resources available to pt.  Order given for social worker to see pt. Additional Follow-up by: Dossie Arbour, RN, BSN,  July 09, 2010 3:21 PM     Appended Document: referral - social worker- drug interaction-left message Change simvastatin to Lipitor 20  Appended Document: referral - social worker- drug interaction-left message LMTCB   Appended Document: referral - social worker- drug interaction-left message unable to reach pt by telephone--pt has appt 07/11/10 with Dr Shirlee Latch

## 2010-07-16 NOTE — Progress Notes (Signed)
Summary: pt wants to talk to anne re leaving hospitall today   Phone Note Call from Patient   Caller: Patient [phone] Reason for Call: Talk to Nurse Summary of Call: pt wants to talk to anne re being dc from hospital today-  Initial call taken by: Glynda Jaeger,  July 08, 2010 12:09 PM  Follow-up for Phone Call        I talked with pt--appt made from 07/11/10 with Dr Shirlee Latch

## 2010-07-16 NOTE — Progress Notes (Signed)
Summary: pt rtn call   Phone Note Call from Patient Call back at Home Phone [phone]   Caller: Patient Reason for Call: Talk to Nurse, Talk to Doctor Summary of Call: pt rtn your call and he has to go back out and wants you to leave a message on his machine and if it's not important he will see you tomorrow Initial call taken by: Omer Jack,  July 10, 2010 2:44 PM  Follow-up for Phone Call        unable to reach pt by telephone--he has appt scheduled 07/11/10 with Dr Shirlee Latch     Appended Document: pt rtn call discussed with pt at appt 07/11/10

## 2010-07-16 NOTE — Medication Information (Signed)
Summary: ccn. per mat pa.  Anticoagulant Therapy  Managed by: Weston Brass, PharmD Referring MD: D.McLean PCP: Dr. Guillermina City MD: Ladona Ridgel MD, Sharlot Gowda Indication 1: Heart Failure Indication 2: DVT INR POC 1.6 INR RANGE 2.0-3.0  Dietary changes: no    Health status changes: no    Bleeding/hemorrhagic complications: no    Recent/future hospitalizations: yes       Details: recent hospitalization due to HFx 4 days  Any changes in medication regimen? no    Recent/future dental: no  Any missed doses?: no       Is patient compliant with meds? yes      Comments: Has been on and off Coumadin since 2004. He has a history of DVTs and mixed systolic/diastolic HF.  Pt. had bleed GI bleed in 2004.  Coumadin was restarted for a while after the bleed and then stopped and patient was switched to ASA daily. He was clot free while on the ASA but then due to financial reasons stopped buying the ASA and then had another clot in his leg. Restarted during recent hospitalization (2/17-2/20).  Patient was given 5mg  (2/18), 7.5mg  (2/19), 5mg  (2/20), and has taken 5mg  daily since discharge (2 additional doses).  After 5 total days his INR is 1.6    Allergies: No Known Drug Allergies  Anticoagulation Management History:      The patient comes in today for his initial visit for anticoagulation therapy.  Negative risk factors for bleeding include an age less than 89 years old.  The bleeding index is 'low risk'.  Positive CHADS2 values include History of HTN.  Negative CHADS2 values include Age > 81 years old.  His last INR was 1.0 ratio.  Anticoagulation responsible provider: Ladona Ridgel MD, Sharlot Gowda.  INR POC: 1.6.  Cuvette Lot#: [id number].  Exp: 05/2011.    Anticoagulation Management Assessment/Plan:      The patient's current anticoagulation dose is Warfarin sodium 3 mg tabs: one daily or as directed.  The target INR is 2.0-3.0.  The next INR is due 07/18/2010.  Anticoagulation instructions were given to patient.   Results were reviewed/authorized by Weston Brass, PharmD.  He was notified by Margot Chimes PharmD Candidate.         Current Anticoagulation Instructions: INR 1.6   Continue to take 1 tablet (5mg ) daily.  Recheck INR in 1 week.

## 2010-07-16 NOTE — Progress Notes (Signed)
Summary: question on meds  Phone Note Call from Patient Call back at Home Phone [phone]   Caller: Patient Reason for Call: Talk to Nurse Summary of Call: pt has question on meds. Initial call taken by: Roe Coombs,  July 12, 2010 8:41 AM  Follow-up for Phone Call        RN s/w PT re: Pharmacy had different medications when compared to his list. Pt stated Rx Lipitor, Bidil, and Benicar were  not there. Pt would like to have [place] resend Rx to pharmacy. RN verified pharmacy: [facility] on [address] Follow-up by: Bernita Raisin, RN, BSN,  July 12, 2010 8:46 AM    Prescriptions: BIDIL 20-37.5 MG TABS (ISOSORB DINITRATE-HYDRALAZINE) take 2 tablets every 8 hours  #180 x 11   Entered by:   Bernita Raisin, RN, BSN   Authorized by:   Marca Ancona, MD   Signed by:   Bernita Raisin, RN, BSN on 07/12/2010   Method used:   Electronically to        [facility] [phone]* (retail)       [address]       [place], [place]  [address]       Ph: [phone]       Fax: [phone]   RxID:   [id number] BENICAR 20 MG TABS (OLMESARTAN MEDOXOMIL) one-half tablet daily  #15 x 11   Entered by:   Bernita Raisin, RN, BSN   Authorized by:   Marca Ancona, MD   Signed by:   Bernita Raisin, RN, BSN on 07/12/2010   Method used:   Electronically to        [facility] [phone]* (retail)       [address]       [place], [place]  [address]       Ph: [phone]       Fax: [phone]   RxID:   [id number] LIPITOR 20 MG TABS (ATORVASTATIN CALCIUM) one daily  #30 x 11   Entered by:   Bernita Raisin, RN, BSN   Authorized by:   Marca Ancona, MD   Signed by:   Bernita Raisin, RN, BSN on 07/12/2010   Method used:   Electronically to        [facility] [phone]* (retail)       [address]       [place], [place]  [address]       Ph: [phone]       Fax: [phone]   RxID:   [id number]

## 2010-07-16 NOTE — Progress Notes (Signed)
Summary: Pharm call regaring medication  Phone Note Call from Patient   Caller: Pharmacy [facility] [address] Reason for Call: Refill Medication Summary of Call: cozzar and diovan are covered but benicar is not and Pharmcy needs a call [phone] Initial call taken by: Omer Jack,  July 12, 2010 11:03 AM  Follow-up for Phone Call        RN s/w Maxine Glenn, Pharmacist reports cozaar is a generic price $1.10 and Diovan $3.30. Benicar is not covered. Will review with Dr Shirlee Latch. Bernita Raisin, RN, BSN  July 12, 2010 12:01 PM      Appended Document: Pharm call regaring medication start losartan (Cozaar) 50 mg daily   Appended Document: Pharm call regaring medication    Phone Note Outgoing Call   Call placed by: Bernita Raisin, RN, BSN,  July 12, 2010 12:13 PM Call placed to: Pharmacy Details for Reason: Medication Change Summary of Call: RN reviewed with Dr Shirlee Latch: change medication to Cozaar 50MG  one tablet daily. RN s/w Maxine Glenn, Pharmacist to change Rx, eRx also sent. Bernita Raisin, RN, BSN  July 12, 2010 12:15 PM     New/Updated Medications: COZAAR 50 MG TABS (LOSARTAN POTASSIUM) Take one tablet by mouth daily Prescriptions: COZAAR 50 MG TABS (LOSARTAN POTASSIUM) Take one tablet by mouth daily  #30 x 11   Entered by:   Bernita Raisin, RN, BSN   Authorized by:   Marca Ancona, MD   Signed by:   Bernita Raisin, RN, BSN on 07/12/2010   Method used:   Electronically to        [facility] [phone]* (retail)       [address]       [place], [place]  [address]       Ph: [phone]       Fax: [phone]   RxID:   [id number]

## 2010-07-17 ENCOUNTER — Encounter: Payer: Self-pay | Admitting: Cardiology

## 2010-07-17 DIAGNOSIS — I82409 Acute embolism and thrombosis of unspecified deep veins of unspecified lower extremity: Secondary | ICD-10-CM

## 2010-07-17 DIAGNOSIS — I509 Heart failure, unspecified: Secondary | ICD-10-CM | POA: Insufficient documentation

## 2010-07-22 ENCOUNTER — Encounter: Payer: Self-pay | Admitting: Cardiology

## 2010-07-22 LAB — CONVERTED CEMR LAB
Alpha-1-Globulin: 5.8 % — ABNORMAL HIGH (ref 2.9–4.9)
Alpha-2-Globulin: 9.9 % (ref 7.1–11.8)
Beta Globulin: 7.3 % — ABNORMAL HIGH (ref 4.7–7.2)

## 2010-07-23 ENCOUNTER — Other Ambulatory Visit: Payer: Self-pay | Admitting: Cardiology

## 2010-07-23 ENCOUNTER — Ambulatory Visit (INDEPENDENT_AMBULATORY_CARE_PROVIDER_SITE_OTHER): Payer: Medicare Other | Admitting: Cardiology

## 2010-07-23 ENCOUNTER — Encounter: Payer: Self-pay | Admitting: Cardiology

## 2010-07-23 DIAGNOSIS — I509 Heart failure, unspecified: Secondary | ICD-10-CM

## 2010-07-23 DIAGNOSIS — I5022 Chronic systolic (congestive) heart failure: Secondary | ICD-10-CM

## 2010-07-23 DIAGNOSIS — R0989 Other specified symptoms and signs involving the circulatory and respiratory systems: Secondary | ICD-10-CM

## 2010-07-23 LAB — HEPATIC FUNCTION PANEL
AST: 26 U/L (ref 0–37)
Albumin: 3.8 g/dL (ref 3.5–5.2)
Alkaline Phosphatase: 105 U/L (ref 39–117)
Bilirubin, Direct: 0.2 mg/dL (ref 0.0–0.3)

## 2010-07-23 LAB — BASIC METABOLIC PANEL
CO2: 32 mEq/L (ref 19–32)
Calcium: 8.8 mg/dL (ref 8.4–10.5)
Creatinine, Ser: 1.4 mg/dL (ref 0.4–1.5)
GFR: 66.49 mL/min (ref >60.00)
Sodium: 137 mEq/L (ref 135–145)

## 2010-07-23 LAB — LIPID PANEL
Cholesterol: 143 mg/dL (ref 0–200)
LDL Cholesterol: 86 mg/dL (ref 0–99)
Total CHOL/HDL Ratio: 4
Triglycerides: 109 mg/dL (ref 0.0–149.0)

## 2010-07-30 ENCOUNTER — Telehealth: Payer: Self-pay | Admitting: Cardiology

## 2010-07-30 ENCOUNTER — Other Ambulatory Visit: Payer: Medicare Other

## 2010-07-30 NOTE — Assessment & Plan Note (Signed)
Summary: 2 WKS/[name]   Visit Type:  2 week follow up Primary Provider:  Dr. Pecola [name]  CC:  some swelling in R-leg.  History of Present Illness: 65 yo with history of multiple DVTs, labile HTN, and diabetes returns for cardiology followup.   He has a long history of DVTs, most recently in 8/10, with resulting chronic venous insufficiency.  He has an IVC filter.  He had a workup this past fall for exertional dypsnea showing mild nonischemic cardiomyopathy with no coronary disease on cath and EF 45% on echo.    Patient was admitted on 07/05/10 with about 10 days of steadily worsening exertional dyspnea.  No definite trigger.  He reports medication compliance.  He was noted to be very volume overloaded and was admitted and diuresed.  Echo showed worse LV systolic function compared to the prior study last fall.  EF was 30-35% with moderate LVH.  Ultrasound in the hospital showed no recurrent lower extremity DVT.  Symptomatically, he continues to do well since discharge.  He denies exertional dyspnea.  He can walk up steps without trouble.  No chest pain.  No orthopnea/PND.  Weight is up 5 lbs.    Labs (8/11): creatinine 1.4 Labs (9/11): LDL 130, HDL 37 Labs (10/11): K 4.0, creatinine 1.5 Labs (2/12): K 3.3, creatinine 1.67 => 1.4, SPEP and UPEP unremarkable, HIV negative  Current Medications (verified): 1)  Glipizide 5 Mg Tabs (Glipizide) .... Take 1 Tablet By Mouth Two Times A Day 2)  Protonix 40 Mg Tbec (Pantoprazole Sodium) .Marland Kitchen.. 1tabqd 3)  Avodart 0.5 Mg Caps (Dutasteride) .Marland Kitchen.. 1 By Mouth Daily 4)  Lipitor 20 Mg Tabs (Atorvastatin Calcium) .... One Daily 5)  Warfarin Sodium 5 Mg Tabs (Warfarin Sodium) .... One Daily or As Directed 6)  Amlodipine Besylate 5 Mg Tabs (Amlodipine Besylate) .... Take One Tablet By Mouth Daily 7)  Metoprolol Succinate 100 Mg Xr24h-Tab (Metoprolol Succinate) .... Take 1 Tablet By Mouth Two Times A Day 8)  Furosemide 40 Mg Tabs (Furosemide) .... Take One Tablet By  Mouth Daily. 9)  Bidil 20-37.5 Mg Tabs (Isosorb Dinitrate-Hydralazine) .... Take 2 Tablets Every 8 Hours 10)  Cozaar 50 Mg Tabs (Losartan Potassium) .... Take One Tablet By Mouth Daily  Allergies (verified): No Known Drug Allergies  Past History:  Past Medical History: 1. 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  IVC filter placed in 2005.  Most recent DVT was in right leg in 8/11.  Now back on coumadin.  2.  Duodenal AVM with GI bleed 3.  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  Diabetes mellitus type II 5.  Smoking 6.  GERD 7.  BPH 8.  Low back pain 9.  HTN 10.  CAD: Left heart cath 2007 with 40-50% stenosis in a small RCA, EF 50%.  Echo (9/11): Septal and apical akinesis, dilated LV, EF 45%, RV normal size and systolic function. Lexiscan myoview (10/11): EF 43%, global hypokinesis, possible small area of apical ischemia.   LHC (10/11): No angiographic CAD.  No LV-gram done due to CKD.  11.  Hyperlipidemia 12.  Nonischemic CMP: Echo (9/11) with septal and apical akinesis, dilated LV, EF 45%, RV normal size and systolic function.  EF 43% on myoview.  Patient was admitted with decompensated CHF in 2/12.  Echo (2/12) showed EF 30-35% with diffuse hypokinesis but akinesis of the mid to apical anteroseptal wall and apex, moderate LVH, mild MR, grade I diastolic dysfunction.  SPEP/UPEP negative and HIV negative  in 2/12.   Family History: Reviewed history from 07/22/2010 and no changes required. No premature CAD.   Social History: Reviewed history from 07/22/2010 and no changes required. Smokes 1 ppd No ETOH.  Part-time work as a Media planner, 1 son  Review of Systems       All systems reviewed and negative except as per HPI.   Vital Signs:  Patient profile:   65 year old male Height:      72 inches Weight:      195.75 pounds BMI:     26.64 Pulse rate:   92 / minute BP sitting:   130 / 94  (left arm) Cuff size:   regular  Vitals Entered By:  [name] CMA (July 23, 2010 12:01 PM)  Physical Exam  General:  Well developed, well nourished, in no acute distress.  Obese.  Neck:  Neck supple, no JVD. No masses, thyromegaly or abnormal cervical nodes. Lungs:  Clear bilaterally to auscultation and percussion. Heart:  Non-displaced PMI, chest non-tender; regular rate and rhythm, S1, S2 without murmurs, rubs or gallops. Carotid upstroke normal, no bruit. 1+ edema to the knee on the right, trace ankle edema on left.  Abdomen:  Bowel sounds positive; abdomen soft and non-tender without masses, organomegaly, or hernias noted. No hepatosplenomegaly. Extremities:  No clubbing or cyanosis. Neurologic:  Alert and oriented x 3. Psych:  Normal affect.   Impression & Recommendations:  Problem # 1:  SYSTOLIC HEART FAILURE, CHRONIC (ICD-428.22) Nonischemic cardiomyopathy.  Patient was recently admitted with acute systolic CHF exacerbation.  EF was 30-35% with moderate LV hypertrophy.  It looked worse than the prior echo in 10/11.  He had no significant coronary disease on a cath in the fall.  I am uncertain what triggered his decompensation and worsening of LV systolic function.   SPEP/UPEP and HIV were unremarkable.  - Echo in 8/12 to reassess LV function on treatment.  - Continue Toprol XL 200 mg daily, Bidil 2 tabs three times a day, and losartan 50 mg daily.  - BMET/BNP  today, if K is ok, will start low dose spironolactone at 12.5 mg daily.  - Followup 2 months.   Problem # 2:  HYPERLIPIDEMIA-MIXED (ICD-272.4) Check lipids today.   Problem # 3:  SMOKING Patient counselled to quit completely.  He is trying.   Other Orders: TLB-BMP (Basic Metabolic Panel-BMET) (80048-METABOL) TLB-BNP (B-Natriuretic Peptide) (83880-BNPR) TLB-Lipid Panel (80061-LIPID) TLB-Hepatic/Liver Function Pnl (80076-HEPATIC)  Patient Instructions: 1)  Your physician recommends that you return for a FASTING lipid profile/liver profile/BMP/BNP 428.22 2)  Your  physician recommends that you schedule a follow-up appointment in: 2 months with Dr [name]. 3)  Your physician has requested that you have an echocardiogram.  Echocardiography is a painless test that uses sound waves to create images of your heart. It provides your doctor with information about the size and shape of your heart and how well your heart's chambers and valves are working.  This procedure takes approximately one hour. There are no restrictions for this procedure. THIS IS SCHEDULED FOR AUGUST 1 at 9:30am

## 2010-08-01 LAB — URINALYSIS, ROUTINE W REFLEX MICROSCOPIC
Glucose, UA: 100 mg/dL — AB
Hgb urine dipstick: NEGATIVE
pH: 6.5 (ref 5.0–8.0)

## 2010-08-02 LAB — CBC
HCT: 51.1 % (ref 39.0–52.0)
Hemoglobin: 16.6 g/dL (ref 13.0–17.0)
MCH: 27.9 pg (ref 26.0–34.0)
MCV: 85.9 fL (ref 78.0–100.0)
Platelets: 99 10*3/uL — ABNORMAL LOW (ref 150–400)
RBC: 5.95 MIL/uL — ABNORMAL HIGH (ref 4.22–5.81)
WBC: 4.7 10*3/uL (ref 4.0–10.5)

## 2010-08-02 LAB — POCT CARDIAC MARKERS
Myoglobin, poc: 118 ng/mL (ref 12–200)
Troponin i, poc: <0.05 ng/mL (ref 0.00–0.09)
Troponin i, poc: <0.05 ng/mL (ref 0.00–0.09)

## 2010-08-02 LAB — DIFFERENTIAL
Eosinophils Relative: 5 % (ref 0–5)
Lymphocytes Relative: 21 % (ref 12–46)
Lymphs Abs: 1 10*3/uL (ref 0.7–4.0)
Monocytes Absolute: 0.5 10*3/uL (ref 0.1–1.0)
Monocytes Relative: 11 % (ref 3–12)

## 2010-08-02 LAB — BASIC METABOLIC PANEL
CO2: 22 mEq/L (ref 19–32)
Chloride: 107 mEq/L (ref 96–112)
GFR calc Af Amer: >60 mL/min (ref >60)
Potassium: 4.9 mEq/L (ref 3.5–5.1)
Sodium: 136 mEq/L (ref 135–145)

## 2010-08-06 NOTE — Miscellaneous (Signed)
Summary: [facility] Professional Communication Notice   [facility] Professional Communication Notice   Imported By: Roderic Ovens 07/29/2010 11:29:51  _____________________________________________________________________  External Attachment:    Type:   Image     Comment:   External Document

## 2010-08-06 NOTE — Progress Notes (Signed)
Summary: refill meds  Phone Note Refill Request Call back at Home Phone [phone] Message from:  Patient on July 30, 2010 11:37 AM  Refills Requested: Medication #1:  GLIPIZIDE 5 MG TABS Take 1 tablet by mouth two times a day [facility] [address]   Method Requested: Fax to Local Pharmacy Initial call taken by: Lorne Skeens,  July 30, 2010 11:37 AM    Prescriptions: GLIPIZIDE 5 MG TABS (GLIPIZIDE) Take 1 tablet by mouth two times a day  #60 x 3   Entered by:   Burnett Kanaris, CNA   Authorized by:   Marca Ancona, MD   Signed by:   Burnett Kanaris, CNA on 07/30/2010   Method used:   Electronically to        [facility] [phone]* (retail)       [address]       [place], [place]  [address]       Ph: [phone]       Fax: [phone]   RxID:   [id number]

## 2010-08-08 ENCOUNTER — Ambulatory Visit (INDEPENDENT_AMBULATORY_CARE_PROVIDER_SITE_OTHER): Payer: Medicare Other | Admitting: *Deleted

## 2010-08-08 DIAGNOSIS — I5022 Chronic systolic (congestive) heart failure: Secondary | ICD-10-CM

## 2010-08-08 LAB — BASIC METABOLIC PANEL
CO2: 26 mEq/L (ref 19–32)
Calcium: 8.7 mg/dL (ref 8.4–10.5)
Creatinine, Ser: 1.5 mg/dL (ref 0.4–1.5)
GFR: 59.51 mL/min — ABNORMAL LOW (ref >60.00)

## 2010-08-09 ENCOUNTER — Telehealth: Payer: Self-pay | Admitting: Cardiology

## 2010-08-09 NOTE — Telephone Encounter (Signed)
Notes Recorded by Jacqlyn Krauss, RN on 08/09/2010 at 2:30 PM I talked with pt--he is aware of glucose 523. He will see his PCP, Dr Haskel Schroeder 08/13/10 Notes Recorded by Marca Ancona, MD on 08/09/2010 at 8:24 AM Glucose is high. Needs followup with PCP Notes Recorded by Sharyn Blitz, RN on 08/08/2010 at 4:20 PM Left message for pt to call the office in regards to abnormal results.

## 2010-08-12 ENCOUNTER — Telehealth: Payer: Self-pay | Admitting: Cardiology

## 2010-08-12 NOTE — Telephone Encounter (Signed)
Mailed Pt ROI to Home Address. 08/12/10/KM

## 2010-08-23 LAB — CBC
HCT: 35 % — ABNORMAL LOW (ref 39.0–52.0)
Hemoglobin: 10.6 g/dL — ABNORMAL LOW (ref 13.0–17.0)
MCHC: 30.2 g/dL (ref 30.0–36.0)
MCV: 77.8 fL — ABNORMAL LOW (ref 78.0–100.0)
RBC: 4.51 MIL/uL (ref 4.22–5.81)
RBC: 4.66 MIL/uL (ref 4.22–5.81)
RDW: 38 % — ABNORMAL HIGH (ref 11.5–15.5)
WBC: 5.9 10*3/uL (ref 4.0–10.5)

## 2010-08-23 LAB — GLUCOSE, CAPILLARY
Glucose-Capillary: 112 mg/dL — ABNORMAL HIGH (ref 70–99)
Glucose-Capillary: 91 mg/dL (ref 70–99)

## 2010-08-23 LAB — BASIC METABOLIC PANEL
BUN: 12 mg/dL (ref 6–23)
Chloride: 105 mEq/L (ref 96–112)
GFR calc Af Amer: >60 mL/min (ref >60)
Potassium: 3.8 mEq/L (ref 3.5–5.1)

## 2010-08-23 LAB — PROTIME-INR
INR: 1.2 (ref 0.00–1.49)
Prothrombin Time: 14.9 seconds (ref 11.6–15.2)

## 2010-08-24 LAB — TYPE AND SCREEN
ABO/RH(D): B POS
Antibody Screen: NEGATIVE

## 2010-08-24 LAB — GLUCOSE, CAPILLARY
Glucose-Capillary: 104 mg/dL — ABNORMAL HIGH (ref 70–99)
Glucose-Capillary: 115 mg/dL — ABNORMAL HIGH (ref 70–99)
Glucose-Capillary: 138 mg/dL — ABNORMAL HIGH (ref 70–99)
Glucose-Capillary: 151 mg/dL — ABNORMAL HIGH (ref 70–99)
Glucose-Capillary: 178 mg/dL — ABNORMAL HIGH (ref 70–99)
Glucose-Capillary: 59 mg/dL — ABNORMAL LOW (ref 70–99)
Glucose-Capillary: 78 mg/dL (ref 70–99)
Glucose-Capillary: 80 mg/dL (ref 70–99)
Glucose-Capillary: 85 mg/dL (ref 70–99)
Glucose-Capillary: 87 mg/dL (ref 70–99)
Glucose-Capillary: 92 mg/dL (ref 70–99)
Glucose-Capillary: 101 mg/dL — ABNORMAL HIGH (ref 70–99)
Glucose-Capillary: 135 mg/dL — ABNORMAL HIGH (ref 70–99)
Glucose-Capillary: 79 mg/dL (ref 70–99)

## 2010-08-24 LAB — URINALYSIS, ROUTINE W REFLEX MICROSCOPIC
Glucose, UA: NEGATIVE mg/dL
Ketones, ur: NEGATIVE mg/dL
Specific Gravity, Urine: 1.013 (ref 1.005–1.030)
pH: 6 (ref 5.0–8.0)

## 2010-08-24 LAB — PROTIME-INR
INR: 1.3 (ref 0.00–1.49)
Prothrombin Time: 13.4 seconds (ref 11.6–15.2)
Prothrombin Time: 15.8 seconds — ABNORMAL HIGH (ref 11.6–15.2)

## 2010-08-24 LAB — COMPREHENSIVE METABOLIC PANEL
AST: 19 U/L (ref 0–37)
Albumin: 3.1 g/dL — ABNORMAL LOW (ref 3.5–5.2)
Alkaline Phosphatase: 65 U/L (ref 39–117)
BUN: 13 mg/dL (ref 6–23)
BUN: 11 mg/dL (ref 6–23)
CO2: 24 mEq/L (ref 19–32)
Calcium: 8.9 mg/dL (ref 8.4–10.5)
Calcium: 8.5 mg/dL (ref 8.4–10.5)
Chloride: 107 mEq/L (ref 96–112)
Creatinine, Ser: 1.36 mg/dL (ref 0.4–1.5)
Creatinine, Ser: 1.48 mg/dL (ref 0.4–1.5)
GFR calc Af Amer: 59 mL/min — ABNORMAL LOW (ref >60)
GFR calc non Af Amer: 48 mL/min — ABNORMAL LOW (ref >60)
Glucose, Bld: 75 mg/dL (ref 70–99)
Glucose, Bld: 68 mg/dL — ABNORMAL LOW (ref 70–99)
Potassium: 3.9 mEq/L (ref 3.5–5.1)
Total Bilirubin: 1.1 mg/dL (ref 0.3–1.2)
Total Protein: 7.3 g/dL (ref 6.0–8.3)

## 2010-08-24 LAB — CBC
HCT: 34.4 % — ABNORMAL LOW (ref 39.0–52.0)
HCT: 35.4 % — ABNORMAL LOW (ref 39.0–52.0)
HCT: 36.2 % — ABNORMAL LOW (ref 39.0–52.0)
HCT: 19.7 % — ABNORMAL LOW (ref 39.0–52.0)
HCT: 24.4 % — ABNORMAL LOW (ref 39.0–52.0)
Hemoglobin: 10.6 g/dL — ABNORMAL LOW (ref 13.0–17.0)
Hemoglobin: 10.8 g/dL — ABNORMAL LOW (ref 13.0–17.0)
Hemoglobin: 5.3 g/dL — CL (ref 13.0–17.0)
Hemoglobin: 7.1 g/dL — CL (ref 13.0–17.0)
MCHC: 29.2 g/dL — ABNORMAL LOW (ref 30.0–36.0)
MCHC: 29.6 g/dL — ABNORMAL LOW (ref 30.0–36.0)
MCHC: 29.7 g/dL — ABNORMAL LOW (ref 30.0–36.0)
MCHC: 29.1 g/dL — ABNORMAL LOW (ref 30.0–36.0)
MCV: 77.4 fL — ABNORMAL LOW (ref 78.0–100.0)
MCV: 59 fL — ABNORMAL LOW (ref 78.0–100.0)
MCV: 65.4 fL — ABNORMAL LOW (ref 78.0–100.0)
Platelets: 352 10*3/uL (ref 150–400)
Platelets: 386 10*3/uL (ref 150–400)
Platelets: 408 10*3/uL — ABNORMAL HIGH (ref 150–400)
Platelets: 318 10*3/uL (ref 150–400)
RBC: 4.57 MIL/uL (ref 4.22–5.81)
RBC: 3.73 MIL/uL — ABNORMAL LOW (ref 4.22–5.81)
RDW: 39.5 % — ABNORMAL HIGH (ref 11.5–15.5)
RDW: 40.1 % — ABNORMAL HIGH (ref 11.5–15.5)
RDW: 40.2 % — ABNORMAL HIGH (ref 11.5–15.5)
RDW: 22.2 % — ABNORMAL HIGH (ref 11.5–15.5)
WBC: 7.6 10*3/uL (ref 4.0–10.5)
WBC: 4.3 10*3/uL (ref 4.0–10.5)

## 2010-08-24 LAB — DIFFERENTIAL
Basophils Absolute: 0 10*3/uL (ref 0.0–0.1)
Basophils Relative: 0 % (ref 0–1)
Eosinophils Relative: 5 % (ref 0–5)
Lymphocytes Relative: 17 % (ref 12–46)
Lymphs Abs: 1.2 10*3/uL (ref 0.7–4.0)
Monocytes Absolute: 0.9 10*3/uL (ref 0.1–1.0)
Monocytes Relative: 9 % (ref 3–12)
Neutro Abs: 2.4 10*3/uL (ref 1.7–7.7)
Neutrophils Relative %: 69 % (ref 43–77)

## 2010-08-24 LAB — LIPID PANEL
Cholesterol: 129 mg/dL (ref 0–200)
HDL: 31 mg/dL — ABNORMAL LOW (ref >39)
LDL Cholesterol: 82 mg/dL (ref 0–99)
Triglycerides: 58 mg/dL (ref <150)
VLDL: 12 mg/dL (ref 0–40)

## 2010-08-24 LAB — HEMOGLOBIN A1C
Hgb A1c MFr Bld: 5 % (ref 4.6–6.1)
Mean Plasma Glucose: 97 mg/dL
Mean Plasma Glucose: 111 mg/dL

## 2010-08-24 LAB — TSH
TSH: 1.493 u[IU]/mL (ref 0.350–4.500)
TSH: 0.736 u[IU]/mL (ref 0.350–4.500)
TSH: 0.901 u[IU]/mL (ref 0.350–4.500)

## 2010-08-24 LAB — HEMOGLOBIN AND HEMATOCRIT, BLOOD
HCT: 34.3 % — ABNORMAL LOW (ref 39.0–52.0)
HCT: 35.1 % — ABNORMAL LOW (ref 39.0–52.0)
HCT: 38 % — ABNORMAL LOW (ref 39.0–52.0)
Hemoglobin: 10.2 g/dL — ABNORMAL LOW (ref 13.0–17.0)
Hemoglobin: 10.5 g/dL — ABNORMAL LOW (ref 13.0–17.0)

## 2010-08-24 LAB — CULTURE, BLOOD (ROUTINE X 2)
Culture: NO GROWTH
Culture: NO GROWTH

## 2010-08-24 LAB — URINE MICROSCOPIC-ADD ON

## 2010-08-24 LAB — D-DIMER, QUANTITATIVE: D-Dimer, Quant: 4.49 ug/mL-FEU — ABNORMAL HIGH (ref 0.00–0.48)

## 2010-08-24 LAB — PSA: PSA: 0.92 ng/mL (ref 0.10–4.00)

## 2010-08-24 LAB — POCT I-STAT, CHEM 8
BUN: 16 mg/dL (ref 6–23)
Calcium, Ion: 1.17 mmol/L (ref 1.12–1.32)
Calcium, Ion: 1.2 mmol/L (ref 1.12–1.32)
Chloride: 106 mEq/L (ref 96–112)
Creatinine, Ser: 1.7 mg/dL — ABNORMAL HIGH (ref 0.4–1.5)
Glucose, Bld: 134 mg/dL — ABNORMAL HIGH (ref 70–99)
Glucose, Bld: 115 mg/dL — ABNORMAL HIGH (ref 70–99)
HCT: 54 % — ABNORMAL HIGH (ref 39.0–52.0)
Hemoglobin: 18.4 g/dL — ABNORMAL HIGH (ref 13.0–17.0)
TCO2: 22 mmol/L (ref 0–100)

## 2010-08-24 LAB — FERRITIN: Ferritin: 8 ng/mL — ABNORMAL LOW (ref 22–322)

## 2010-08-24 LAB — BRAIN NATRIURETIC PEPTIDE: Pro B Natriuretic peptide (BNP): 167 pg/mL — ABNORMAL HIGH (ref 0.0–100.0)

## 2010-08-24 LAB — T4, FREE: Free T4: 0.62 ng/dL — ABNORMAL LOW (ref 0.80–1.80)

## 2010-08-24 LAB — FOLATE: Folate: >20 ng/mL

## 2010-08-26 ENCOUNTER — Telehealth: Payer: Self-pay | Admitting: Cardiology

## 2010-08-26 NOTE — Telephone Encounter (Signed)
Pt brought Signed ROI in, Copied Records for Pt he took with him. 08/26/10/KM

## 2010-09-03 ENCOUNTER — Telehealth: Payer: Self-pay | Admitting: Cardiology

## 2010-09-03 NOTE — Telephone Encounter (Signed)
LMTCB

## 2010-09-03 NOTE — Telephone Encounter (Signed)
[facility]

## 2010-09-04 NOTE — Telephone Encounter (Signed)
Not sure what the risk of [facility] water is.  Could you ask him more about this?

## 2010-09-04 NOTE — Telephone Encounter (Signed)
I talked with pt. Pt is requesting a statement from Dr Shirlee Latch stating that some of his medical conditions may have been due to the water pt  drank at [facility]. He would like this statement to send to the [place]. I will forward to Dr Shirlee Latch for review.

## 2010-09-10 NOTE — Telephone Encounter (Signed)
I talked with pt. He will bring information about the water at [facility] to the office  for Dr Shirlee Latch to review.

## 2010-09-10 NOTE — Telephone Encounter (Signed)
LMTCB

## 2010-09-20 ENCOUNTER — Encounter: Payer: Self-pay | Admitting: Cardiology

## 2010-09-23 ENCOUNTER — Encounter: Payer: Self-pay | Admitting: *Deleted

## 2010-09-24 ENCOUNTER — Ambulatory Visit (INDEPENDENT_AMBULATORY_CARE_PROVIDER_SITE_OTHER): Payer: Medicare Other | Admitting: Cardiology

## 2010-09-24 ENCOUNTER — Encounter: Payer: Self-pay | Admitting: Cardiology

## 2010-09-24 VITALS — BP 142/97 | HR 92 | Ht 72.0 in | Wt 179.0 lb

## 2010-09-24 DIAGNOSIS — F17200 Nicotine dependence, unspecified, uncomplicated: Secondary | ICD-10-CM

## 2010-09-24 DIAGNOSIS — I5022 Chronic systolic (congestive) heart failure: Secondary | ICD-10-CM

## 2010-09-24 DIAGNOSIS — I82409 Acute embolism and thrombosis of unspecified deep veins of unspecified lower extremity: Secondary | ICD-10-CM

## 2010-09-24 DIAGNOSIS — E785 Hyperlipidemia, unspecified: Secondary | ICD-10-CM

## 2010-09-24 DIAGNOSIS — I10 Essential (primary) hypertension: Secondary | ICD-10-CM

## 2010-09-24 MED ORDER — LOSARTAN POTASSIUM 100 MG PO TABS: 100.0000 mg | ORAL_TABLET | Freq: Every day | ORAL | Status: DC

## 2010-09-24 NOTE — Patient Instructions (Signed)
Increase losartan to 100mg  daily. You can take two 50mg  tablets daily at the same time.  Lab in 2 weeks--BMP/BNP 428.22  Keep the appointment for the echocardiogram already scheduled for December 18, 2010.  Schedule an appointment to see Dr Shirlee Latch in August a few days after the echocardiogram has been done.

## 2010-09-25 DIAGNOSIS — F17200 Nicotine dependence, unspecified, uncomplicated: Secondary | ICD-10-CM | POA: Insufficient documentation

## 2010-09-25 NOTE — Progress Notes (Signed)
PCP: Dr. Pecola Leisure  65 yo with history of multiple DVTs, labile HTN, and diabetes returns for cardiology followup.   He has a long history of DVTs, most recently in 8/10, with resulting chronic venous insufficiency.  He has an IVC filter.  He had a workup this past fall for exertional dypsnea showing mild nonischemic cardiomyopathy with no coronary disease on cath and EF 45% on echo.    Patient was then admitted on 07/05/10 with about 10 days of steadily worsening exertional dyspnea.  No definite trigger.  He reports medication compliance.  He was noted to be very volume overloaded and was admitted and diuresed.  Echo showed worse LV systolic function compared to the prior study last fall.  EF was 30-35% with moderate LVH.  Ultrasound in the hospital showed no recurrent lower extremity DVT.    Symptomatically, he continues to do well since discharge.  He denies exertional dyspnea.  He can walk up a flight of steps without trouble.  No chest pain.  No orthopnea/PND.  Weight is down 16 lbs with Lasix use.  He is trying to quit smoking and is down to 1-2 cigarettes a week.   ECG: NSR, LVH, inferior T wave inversions  Labs (8/11): creatinine 1.4 Labs (9/11): LDL 130, HDL 37 Labs (10/11): K 4.0, creatinine 1.5 Labs (2/12): K 3.3, creatinine 1.67 => 1.4, SPEP and UPEP unremarkable, HIV negative Labs (3/12): K 4.3, creatinine 1.4, BNP 316, LDL 86, HDL 38   Past Medical History: 1. 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  IVC filter placed in 2005.  Most recent DVT was in right leg in 8/11.  Now back on coumadin.  2.  Duodenal AVM with GI bleed 3.  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  Diabetes mellitus type II 5.  Smoking 6.  GERD 7.  BPH 8.  Low back pain 9.  HTN 10.  CAD: Left heart cath 2007 with 40-50% stenosis in a small RCA, EF 50%.  Echo (9/11): Septal and apical akinesis, dilated LV, EF 45%, RV normal size and systolic function. Lexiscan myoview (10/11): EF 43%,  global hypokinesis, possible small area of apical ischemia.   LHC (10/11): No angiographic CAD.  No LV-gram done due to CKD.  11.  Hyperlipidemia 12.  Nonischemic CMP: Echo (9/11) with septal and apical akinesis, dilated LV, EF 45%, RV normal size and systolic function.  EF 43% on myoview.  Patient was admitted with decompensated CHF in 2/12.  Echo (2/12) showed EF 30-35% with diffuse hypokinesis but akinesis of the mid to apical anteroseptal wall and apex, moderate LVH, mild MR, grade I diastolic dysfunction.  SPEP/UPEP negative and HIV negative in 2/12.   Family History: No premature CAD.   Social History: Smoking 1-2 cigs/week No ETOH.  Part-time work as a Media planner, 1 son  Review of Systems        All systems reviewed and negative except as per HPI.   Current Outpatient Prescriptions  Medication Sig Dispense Refill  . amLODipine (NORVASC) 5 MG tablet Take 5 mg by mouth daily.        Marland Kitchen atorvastatin (LIPITOR) 20 MG tablet Take 20 mg by mouth daily.        Marland Kitchen dutasteride (AVODART) 0.5 MG capsule Take 0.5 mg by mouth daily.        . furosemide (LASIX) 40 MG tablet Take 40 mg by mouth daily.        Marland Kitchen glipiZIDE (GLUCOTROL) 5  MG tablet Take 5 mg by mouth 2 (two) times daily before a meal.        . isosorbide-hydrALAZINE (BIDIL) 20-37.5 MG per tablet Take 2 tablets by mouth 3 (three) times daily.        . metoprolol (TOPROL-XL) 100 MG 24 hr tablet Take 100 mg by mouth 2 (two) times daily.        . pantoprazole (PROTONIX) 40 MG tablet Take 40 mg by mouth daily.        Marland Kitchen spironolactone (ALDACTONE) 25 MG tablet Take 12.5 mg by mouth daily.        Marland Kitchen warfarin (COUMADIN) 5 MG tablet Take by mouth as directed.        Marland Kitchen losartan (COZAAR) 100 MG tablet Take 1 tablet (100 mg total) by mouth daily.  30 tablet  11    BP 142/97  Pulse 92  Ht 6' (1.829 m)  Wt 179 lb (81.194 kg)  BMI 24.28 kg/m2 General:  Well developed, well nourished, in no acute distress.  Obese.  Neck:  Neck  supple, no JVD. No masses, thyromegaly or abnormal cervical nodes. Lungs:  Clear bilaterally to auscultation and percussion. Heart:  Non-displaced PMI, chest non-tender; regular rate and rhythm, S1, S2 without murmurs, rubs or gallops. Carotid upstroke normal, no bruit. 1+ edema 2/3 to the knee on the right, trace ankle edema on left (chronically asymmetric).  Abdomen:  Bowel sounds positive; abdomen soft and non-tender without masses, organomegaly, or hernias noted. No hepatosplenomegaly. Extremities:  No clubbing or cyanosis. Neurologic:  Alert and oriented x 3. Psych:  Normal affect.

## 2010-09-25 NOTE — Assessment & Plan Note (Signed)
He has almost quit.  His efforts were encouraged.

## 2010-09-25 NOTE — Assessment & Plan Note (Signed)
Continue current Lipitor dose.

## 2010-09-25 NOTE — Assessment & Plan Note (Signed)
History of multiple DVTs.  This is the likely cause of his asymmetric leg swelling.  He is on coumadin.

## 2010-09-25 NOTE — Assessment & Plan Note (Signed)
BP is still high.  Losartan increased as above.

## 2010-09-25 NOTE — Assessment & Plan Note (Signed)
Nonischemic cardiomyopathy.  Patient was recently admitted with acute systolic CHF exacerbation.  EF was 30-35% with moderate LV hypertrophy.  It looked worse than the prior echo in 10/11.  He had no significant coronary disease on a cath in the fall.  I am uncertain what triggered his decompensation and worsening of LV systolic function.   SPEP/UPEP and HIV were unremarkable.  - Echo in 8/12 to reassess LV function on treatment.  - Continue Toprol XL 200 mg daily, Bidil 2 tabs three times a day, and spironolactone 12.5 mg daily.  - Increase losartan to 100 mg daily.  - BMET/BNP in 2 wks.

## 2010-09-28 ENCOUNTER — Inpatient Hospital Stay (HOSPITAL_COMMUNITY)
Admission: EM | Admit: 2010-09-28 | Discharge: 2010-10-01 | DRG: 637 | Disposition: A | Discharge status: Home / Self Care | Payer: Medicare Other | Attending: Internal Medicine | Admitting: Internal Medicine

## 2010-09-28 ENCOUNTER — Emergency Department (HOSPITAL_COMMUNITY): Payer: Medicare Other

## 2010-09-28 DIAGNOSIS — Z7901 Long term (current) use of anticoagulants: Secondary | ICD-10-CM

## 2010-09-28 DIAGNOSIS — I248 Other forms of acute ischemic heart disease: Secondary | ICD-10-CM | POA: Diagnosis present

## 2010-09-28 DIAGNOSIS — K219 Gastro-esophageal reflux disease without esophagitis: Secondary | ICD-10-CM | POA: Diagnosis present

## 2010-09-28 DIAGNOSIS — I129 Hypertensive chronic kidney disease with stage 1 through stage 4 chronic kidney disease, or unspecified chronic kidney disease: Secondary | ICD-10-CM | POA: Diagnosis present

## 2010-09-28 DIAGNOSIS — Z86718 Personal history of other venous thrombosis and embolism: Secondary | ICD-10-CM

## 2010-09-28 DIAGNOSIS — I2489 Other forms of acute ischemic heart disease: Secondary | ICD-10-CM | POA: Diagnosis present

## 2010-09-28 DIAGNOSIS — E871 Hypo-osmolality and hyponatremia: Secondary | ICD-10-CM | POA: Diagnosis present

## 2010-09-28 DIAGNOSIS — N179 Acute kidney failure, unspecified: Secondary | ICD-10-CM | POA: Diagnosis present

## 2010-09-28 DIAGNOSIS — N189 Chronic kidney disease, unspecified: Secondary | ICD-10-CM | POA: Diagnosis present

## 2010-09-28 DIAGNOSIS — Z794 Long term (current) use of insulin: Secondary | ICD-10-CM

## 2010-09-28 DIAGNOSIS — E1310 Other specified diabetes mellitus with ketoacidosis without coma: Secondary | ICD-10-CM | POA: Diagnosis present

## 2010-09-28 DIAGNOSIS — E785 Hyperlipidemia, unspecified: Secondary | ICD-10-CM | POA: Diagnosis present

## 2010-09-28 DIAGNOSIS — E1100 Type 2 diabetes mellitus with hyperosmolarity without nonketotic hyperglycemic-hyperosmolar coma (NKHHC): Principal | ICD-10-CM | POA: Diagnosis present

## 2010-09-28 DIAGNOSIS — I5023 Acute on chronic systolic (congestive) heart failure: Secondary | ICD-10-CM | POA: Diagnosis present

## 2010-09-28 DIAGNOSIS — Z79899 Other long term (current) drug therapy: Secondary | ICD-10-CM

## 2010-09-28 DIAGNOSIS — I509 Heart failure, unspecified: Secondary | ICD-10-CM | POA: Diagnosis present

## 2010-09-28 DIAGNOSIS — F17200 Nicotine dependence, unspecified, uncomplicated: Secondary | ICD-10-CM | POA: Diagnosis present

## 2010-09-28 DIAGNOSIS — I872 Venous insufficiency (chronic) (peripheral): Secondary | ICD-10-CM | POA: Diagnosis present

## 2010-09-28 LAB — COMPREHENSIVE METABOLIC PANEL
ALT: 30 U/L (ref 0–53)
AST: 26 U/L (ref 0–37)
Alkaline Phosphatase: 99 U/L (ref 39–117)
GFR calc Af Amer: 50 mL/min — ABNORMAL LOW (ref >60)
Glucose, Bld: 748 mg/dL (ref 70–99)
Potassium: 4.5 mEq/L (ref 3.5–5.1)
Sodium: 126 mEq/L — ABNORMAL LOW (ref 135–145)
Total Protein: 7.3 g/dL (ref 6.0–8.3)

## 2010-09-28 LAB — PROTIME-INR: Prothrombin Time: 13.2 seconds (ref 11.6–15.2)

## 2010-09-28 LAB — GLUCOSE, CAPILLARY
Glucose-Capillary: >600 mg/dL (ref 70–99)
Glucose-Capillary: 437 mg/dL — ABNORMAL HIGH (ref 70–99)
Glucose-Capillary: 376 mg/dL — ABNORMAL HIGH (ref 70–99)
Glucose-Capillary: 273 mg/dL — ABNORMAL HIGH (ref 70–99)
Glucose-Capillary: 289 mg/dL — ABNORMAL HIGH (ref 70–99)
Glucose-Capillary: 209 mg/dL — ABNORMAL HIGH (ref 70–99)
Glucose-Capillary: 233 mg/dL — ABNORMAL HIGH (ref 70–99)

## 2010-09-28 LAB — CBC
Hemoglobin: 13.7 g/dL (ref 13.0–17.0)
MCH: 26.8 pg (ref 26.0–34.0)
MCHC: 33.3 g/dL (ref 30.0–36.0)
Platelets: 149 10*3/uL — ABNORMAL LOW (ref 150–400)

## 2010-09-28 LAB — RAPID URINE DRUG SCREEN, HOSP PERFORMED
Barbiturates: NOT DETECTED
Benzodiazepines: NOT DETECTED

## 2010-09-28 LAB — MAGNESIUM: Magnesium: 2.1 mg/dL (ref 1.5–2.5)

## 2010-09-28 LAB — POCT CARDIAC MARKERS

## 2010-09-28 LAB — CK TOTAL AND CKMB (NOT AT ARMC): Total CK: 212 U/L (ref 7–232)

## 2010-09-28 NOTE — H&P (Signed)
NAMESHERRI, LEVENHAGEN               ACCOUNT NO.:  [id number]  MEDICAL RECORD NO.:  [id number]           PATIENT TYPE:  E  LOCATION:  [facility]                         FACILITY:  [facility]  PHYSICIAN:  Marinda Elk, M.D.DATE OF BIRTH:  1946-02-07  DATE OF ADMISSION:  09/28/2010 DATE OF DISCHARGE:                             HISTORY & PHYSICAL   PRIMARY CARE DOCTOR:  Betti D. Pecola Leisure, MD.  CARDIOLOGIST:  Dr. Shirlee Latch.  CHIEF COMPLAINT:  Chest pain.  HISTORY OF PRESENT ILLNESS:  This is a 65 year old male with past medical history of systolic and diastolic heart failure by 2-D echo in July 07, 2010, that showed an EF of 30% with diffuse hypokinesia and grade 1 diastolic heart failure.  Also history of DVT multiple times, ongoing tobacco abuse, comes in for chest pain.  He relayed he had a fight with his wife, went out to cut the lawn when he started having chest pain.  He relates this chest pain got a little bit better when he sat down.  At that time, he was sweating.  So he called the EMS.  When in EMS, he was given nitroglycerin to help with the pain.  He describes the pain as retrosternal, crushing in intensity 10/10.  At that time, he was not nauseated, but he was sweating.  He does not know if he was because he was mowing the lawn.  He relates no nausea, vomiting, or diarrhea.  ALLERGIES:  No known drug allergies.  PAST MEDICAL HISTORY: 1. Chronic systolic and diastolic heart failure with an EF of 35%. 2. History of DVT status post IVC filter in 2005, currently on     Coumadin. 3. Chronic venous insufficiency secondary to DVT. 4. Chronic dyspnea with a positive Myoview in 2051m, normal course of     catheterization in 2011. 5. Diabetes type 2. 6. Ongoing tobacco. 7. GERD. 8. BPH. 9. Low back pain. 10.Hypertension. 11.Hyperlipidemia.  MEDICATIONS:  As per discharge summary as he does not remember, 1. Amlodipine 5 mg p.o. daily. 2. Lasix 40 mg daily. 3. BiDil 20/37.5  mg two tablets q.6 h. 4. Metoprolol succinate 100 mg p.o. daily. 5. Benicar 10 mg daily. 6. Warfarin 5 mg daily. 7. Avodart 0.5 mg tablet daily. 8. Glipizide 5 mg b.i.d. 9. Protonix 40 mg daily. 10.Simvastatin 40 mg daily.  SOCIAL HISTORY:  Lives in [place] with his wife, with his girlfriend. He is retired.  Continues to smoke.  Denies alcohol or drugs.  FAMILY HISTORY:  Mother died at 56 with a stroke.  His father died of traumatic injury at 47.  Has four sisters, they are all living. Multiple siblings have diabetes.  No coronary artery disease and strokes.  PHYSICAL EXAM:  VITAL SIGNS:  Temperature 98, pulse of 85, blood pressure 109/71.  He was satting 98% on room air, breathing 16 breath per minute. GENERAL:  He is awake, alert, oriented x3.  Chest pain free.  The patient is now currently chest pain-free. HEENT:  Normocephalic, atraumatic.  Pupils equal, round, and reactive to light and accommodation.  Anicteric.  No jaundice. CARDIOVASCULAR:  He has regular rate  and rhythm with positive S1 and S2. No murmurs, rubs, or gallops. LUNGS:  He has good air movement with crackles at bases. ABDOMEN:  Positive bowel sounds, nontender, and nondistended, soft. EXTREMITIES:  Positive pulses.  No clubbing, cyanosis, or edema.  His right leg is more edematous than his left, but he says this is not new. NEURO:  Nonfocal. SKIN:  No rashes or ulceration.  LABORATORY DATA:  Labs on admission, white count of 6.1, hemoglobin of 13, platelet count 149, INR 0.9.  Sodium 126, potassium 4.5, chloride 90, bicarb of 23, BUN of 20, creatinine 1.6, blood glucose of 146. First set of cardiac enzymes show a troponin less than 0.30 and MB of 5.3.  EKG shows sinus tachycardia with more pronounced T-wave inversion in II, III, and aVF, also with more pronounced T-wave inversion in V3 through V6.  An chest x-ray shows cardiomegaly with vascular congestion.  ASSESSMENT/PLAN: 1. Hyperosmolar  hyperglycemic nonketotic.  We will start him on the     Glucovance and check CBG every hours and BMET q.2.  Once his blood     sugars are less than 200, we will switch to home dose.  At this     time, the cause of hyperglycemia is unknown, but it seems to could     have bit triggered up by episode of unstable angina it does sound     by history.  Although, his cardiac enzymes were negative.  He has     very convincing history of typical chest pain and new EKG changes. 2. Unstable angina.   I will start him on heparin or Lovenox.  We will     start him on beta-blocker, aspirin, and statin.  We will hold ACE      due to his new acute kidney injury.  3. Pseudohyponatremia.  This is probably secondary to increased blood     glucoses.  These are coming down with correction of his blood     glucose.  We will continue to check BMET. 4. History of deep venous thrombosis.  He is not taking his Coumadin.     His INR was less than 1.  I will start him on Lovenox and continue     his Coumadin if okay with cards.  I will now change his Lovenox to     heparin at this     time.  Once his creatinine is down, we will probably get a CT angio     to rule out deep venous thrombosis. 5. Acute kidney injury.  This is probably secondary to dehydration.  I     will start him on IV fluids.  Check a BMET in the morning and urine     and creatinine.     Marinda Elk, M.D.     AF/MEDQ  D:  09/28/2010  T:  09/28/2010  Job:  [id number]  cc:   Della Goo, M.D.  Electronically Signed by Marinda Elk M.D. on 09/28/2010 09:39:34 PM

## 2010-09-29 DIAGNOSIS — R072 Precordial pain: Secondary | ICD-10-CM

## 2010-09-29 LAB — SODIUM, URINE, RANDOM: Sodium, Ur: 23 mEq/L

## 2010-09-29 LAB — GLUCOSE, CAPILLARY
Glucose-Capillary: 121 mg/dL — ABNORMAL HIGH (ref 70–99)
Glucose-Capillary: 173 mg/dL — ABNORMAL HIGH (ref 70–99)
Glucose-Capillary: 204 mg/dL — ABNORMAL HIGH (ref 70–99)
Glucose-Capillary: 465 mg/dL — ABNORMAL HIGH (ref 70–99)
Glucose-Capillary: 240 mg/dL — ABNORMAL HIGH (ref 70–99)

## 2010-09-29 LAB — HEMOGLOBIN A1C
Hgb A1c MFr Bld: 19.7 % — ABNORMAL HIGH (ref <5.7)
Mean Plasma Glucose: 519 mg/dL — ABNORMAL HIGH (ref <117)

## 2010-09-29 LAB — PROTIME-INR
INR: 1.08 (ref 0.00–1.49)
Prothrombin Time: 14.2 seconds (ref 11.6–15.2)

## 2010-09-29 LAB — MRSA PCR SCREENING: MRSA by PCR: NEGATIVE

## 2010-09-29 LAB — CARDIAC PANEL(CRET KIN+CKTOT+MB+TROPI): Total CK: 354 U/L — ABNORMAL HIGH (ref 7–232)

## 2010-09-29 LAB — BASIC METABOLIC PANEL
BUN: 15 mg/dL (ref 6–23)
Chloride: 97 mEq/L (ref 96–112)
Creatinine, Ser: 1.41 mg/dL (ref 0.4–1.5)
GFR calc non Af Amer: 51 mL/min — ABNORMAL LOW (ref >60)
Glucose, Bld: 440 mg/dL — ABNORMAL HIGH (ref 70–99)
Potassium: 4 mEq/L (ref 3.5–5.1)

## 2010-09-29 LAB — GLUCOSE, RANDOM: Glucose, Bld: 451 mg/dL — ABNORMAL HIGH (ref 70–99)

## 2010-09-29 LAB — TSH: TSH: 0.608 u[IU]/mL (ref 0.350–4.500)

## 2010-09-29 LAB — CREATININE, URINE, RANDOM: Creatinine, Urine: 81.41 mg/dL

## 2010-09-29 NOTE — Consult Note (Signed)
[name], [name]               ACCOUNT NO.:  [id number]  MEDICAL RECORD NO.:  [id number]           PATIENT TYPE:  I  LOCATION:  4741                         FACILITY:  [facility]  PHYSICIAN:  Jake Bathe, MD      DATE OF BIRTH:  1945/11/19  DATE OF CONSULTATION:  09/28/2010 DATE OF DISCHARGE:                                CONSULTATION   PRIMARY CARDIOLOGIST:  Marca Ancona, MD from [place].  PRIMARY CARE DOCTOR:  Dr. Pecola Leisure.  REASON FOR CONSULTATION:  Chest pain.  HISTORY OF PRESENT ILLNESS:  The patient is a 65 year old male with a past medical history of systolic heart failure with an ejection fraction of 30-35%, history of DVTs, history of ongoing tobacco abuse, poorly  controlled diabetes, chronic kidney disease hypertension, and hyperlipidemia,  presented to the emergency room complaining of chest pain.  This morning, he was cutting grass and afterwards developed retrosternal, squeezing-type chest pain which the patient described as stabbing on his chest.  It was 6/10 in intensity, lasted for a few minutes, no radiation.  The patient then proceeded to smoke one cigarettes which relieved his symptoms and then he sat down.  Afterwards, he stood up and the pain reoccurred.  Therefore, EMS was called and the patient was transferred to [facility], during which time he was given nitroglycerin x3 and 10-15 minutes after the third nitroglycerin was given, the patient's chest pain resolved.  Note, the patient has had similar episodes of chest pain a year ago with negative cath done by Dr. Shirlee Latch. The patient states also he has been having some GI distress with some nausea and diarrhea.  Denies any fever or chills or shortness of breath.  ALLERGIES:  No known drug allergies.  PAST MEDICAL HISTORY: 1. Cardiac cath in May 2011 which was negative for coronary artery     disease. 2. CHF with an ejection fraction of 30-35%, diffuse hypokinesis with     akinesis of the mid-to-distal  atrioseptal plus apical myocardium     and mild mitral regurgitation. 3. History of DVT in 2004, 2005, 2007, 2010, and 2011, status post IVC     filter in 2005. 4. Ongoing tobacco abuse. 5. Poorly controlled diabetes. 6. Chronic kidney disease. 7. GERD. 8. Hypertension. 9. Hyperlipidemia.  MEDICATIONS: 1. Glipizide 5 mg twice daily. 2. Protonix 40 mg once daily. 3. Avodart 0.5 mg daily. 4. Lipitor 20 mg daily. 5. Amlodipine 5 mg daily. 6. Metoprolol XL 100 mg daily. 7. Lasix 40 mg daily. 8. BiDil 20-37.5 mg twice daily. 9. Cozaar 50 mg daily.  SOCIAL HISTORY:  The patient lives in [place], lives alone.  He is currently unemployed.  Two years ago, he was a security guard.  He is divorced, has one child.  He has been smoking for 47 years, approximately one pack per day.  Denies any alcohol or drug abuse.  FAMILY HISTORY:  Noncontributory.  REVIEW OF SYSTEMS:  The patient denies fever, chills, sweats.  Denies any headaches or voice change.  Has some chest pain, shortness of breath, and edema along with nausea and diarrhea.  All other systems reviewed  and negative.  CODE STATUS:  The patient is full code.  PHYSICAL EXAMINATION:  VITAL SIGNS:  Temperature 98, pulse is 85-112, respiratory rate 16, blood pressure is 107-109/71-75, and O2 saturation is 98% on room air. GENERAL:  No acute distress. HEENT:  Normocephalic and atraumatic.  Pupils equal, round, and reactive to light.  Extraocular movement intact. NECK:  No bruits.  Positive JVD. CARDIOVASCULAR:  Regular rate and rhythm.  No murmurs, rubs, or gallops. LUNGS:  Clear to auscultation with some mild bibasilar crackles at bases. ABDOMEN:  Soft, nontender, and nondistended.  Positive bowel sounds. Negative hepatosplenomegaly. EXTREMITIES:  Trace edema.  Positive pulses.  RADIOLOGY:  Cardiomegaly with vascular congestion.  EKG; some T-wave inversion in lead II, III, aVF.  This appears to be somewhat unchanged from  his prior EKG.  LABORATORY FINDINGS:  White blood cell count 6.1, hemoglobin 13.7, hematocrit 41.1, and platelet count 149.  Sodium 126, potassium 4.5, chloride 90, bicarb 23, BUN 20, creatinine 1.68, and glucose 748.  Anion gap is 13.  LFTs were within normal limits.  INR is 0.98 and troponin less than 0.3.  ASSESSMENT AND PLAN: 1. Chest pain.  Given the patient's history, the presentation is     somewhat concerning acute coronary syndrome, however, the patient     had similar episodes last year with a negative cath which is very     reassuring and therefore, we believe that his chest pain may be     better explained with this current presentation of diabetic     ketoacidosis along with mild acute on chronic systolic heart     failure.  Thus far, EKG shows T-wave inversion which are not new.     Cardiac enzymes negative x1.  Our plan is to continue monitoring     with primary team, continue to trend cardiac enzymes.  Obtain EKG     in the morning, optimize his medication for maximal medical     therapy. 2. Mild diabetic ketoacidosis.  The patient's anion gap is 13.       His blood sugars are 748 on admission.  This is being     managed by our primary team, agree with [facility] protocol,     however, restrict the amount of fluids as the patient is in mild     acute systolic heart failure.  We would recommend glucose lowering     with insulin and then once the patient is no longer in diabetic     ketoacidosis, to begin diuresing him. 3. Acute on chronic systolic heart failure.  We will check a BNP.  The     patient so far has mild signs of fluid overload.  We will proceed     with caution.  For now, we will not give any diuretics.  We will     continue to provide insulin until he is no longer in diabetic     ketoacidosis, then we will consider adding gentle diuresis. 4. Tobacco abuse.  The patient counseled on smoking cessation. 5. Hypertension.  The patient's blood pressure is  well controlled. 6. Chronic kidney disease.  The patient's baseline appears to be     around 1.6 to 1.7.  He is at baseline. 7. Multiple prior histories of deep vein thrombosis.  The patient     needs to be restarted on his Coumadin.  This is to be written for     per protocol.  We will continue to follow the patient  with you.    Thank you very much for your consult.     Darnelle Maffucci, MD  Patient seen and examined with Dr. Gilford Rile. Agree with above plan. Control DKA. Watch for  worsening systolic heart failure.  ______________________________ Jake Bathe, MD    PT/MEDQ  D:  09/28/2010  T:  09/29/2010  Job:  [id number]  Electronically Signed by Darnelle Maffucci  on 09/29/2010 12:21:03 PM Electronically Signed by Donato Schultz MD on 09/29/2010 09:41:10 PM

## 2010-09-30 LAB — GLUCOSE, CAPILLARY
Glucose-Capillary: 259 mg/dL — ABNORMAL HIGH (ref 70–99)
Glucose-Capillary: 364 mg/dL — ABNORMAL HIGH (ref 70–99)

## 2010-09-30 LAB — CBC
MCHC: 33 g/dL (ref 30.0–36.0)
RDW: 15.9 % — ABNORMAL HIGH (ref 11.5–15.5)
WBC: 4.5 10*3/uL (ref 4.0–10.5)

## 2010-09-30 LAB — BASIC METABOLIC PANEL
Calcium: 9 mg/dL (ref 8.4–10.5)
GFR calc Af Amer: >60 mL/min (ref >60)
GFR calc non Af Amer: 56 mL/min — ABNORMAL LOW (ref >60)
Potassium: 3.6 mEq/L (ref 3.5–5.1)
Sodium: 135 mEq/L (ref 135–145)

## 2010-09-30 LAB — PROTIME-INR
INR: 1.08 (ref 0.00–1.49)
Prothrombin Time: 14.2 seconds (ref 11.6–15.2)

## 2010-10-01 LAB — GLUCOSE, CAPILLARY
Glucose-Capillary: 330 mg/dL — ABNORMAL HIGH (ref 70–99)
Glucose-Capillary: 250 mg/dL — ABNORMAL HIGH (ref 70–99)

## 2010-10-01 LAB — CBC
HCT: 38.4 % — ABNORMAL LOW (ref 39.0–52.0)
Hemoglobin: 12.6 g/dL — ABNORMAL LOW (ref 13.0–17.0)
MCH: 26 pg (ref 26.0–34.0)
MCV: 79.2 fL (ref 78.0–100.0)
Platelets: 151 10*3/uL (ref 150–400)
RBC: 4.85 MIL/uL (ref 4.22–5.81)
WBC: 4.4 10*3/uL (ref 4.0–10.5)

## 2010-10-01 LAB — BASIC METABOLIC PANEL
Chloride: 102 mEq/L (ref 96–112)
Creatinine, Ser: 1.32 mg/dL (ref 0.4–1.5)
GFR calc Af Amer: >60 mL/min (ref >60)
Potassium: 3.7 mEq/L (ref 3.5–5.1)
Sodium: 134 mEq/L — ABNORMAL LOW (ref 135–145)

## 2010-10-01 NOTE — H&P (Signed)
[name], [name]               ACCOUNT NO.:  [id number]   MEDICAL RECORD NO.:  [id number]          PATIENT TYPE:  INP   LOCATION:  4736                         FACILITY:  [facility]   PHYSICIAN:  Renee Ramus, MD       DATE OF BIRTH:  18-Jun-1945   DATE OF ADMISSION:  12/25/2008  DATE OF DISCHARGE:                              HISTORY & PHYSICAL   PRIMARY CARE PHYSICIAN:  Della Goo, MD   HISTORY OF PRESENT ILLNESS:  The patient is a 65 year old male with  complaints of lightheadedness and near syncopal episode prior to  admission.  The patient denies fevers, chills, night sweats, nausea,  vomiting, chest pain, shortness of breath, PND, or orthopnea.  The  patient denies black or tarry stools.  He denies any obvious source of  bleed.  The patient does have a history of extremely poor medication  compliance.  The patient is on a chronic anticoagulation secondary to  recurrent lower extremity DVT.  He is also status post filter placement.  The patient however has an INR today of 1.0 and negative guaiac.  The  patient presents with severe anemia.  He has been admitted to our  service for further evaluation and treatment.   PAST MEDICAL HISTORY:  1. Recurrent DVT status post [place].  2. Diabetes mellitus type 2, uncontrolled.  3. Coronary artery disease with cath in 2007 showing non-obstructive      disease and an echo in 2007 showing an EF of 50% without wall      motion abnormalities.  4. Hypertension.  5. Depression.  6. Gastroesophageal reflux disease.  7. Benign prostatic hypertrophy.  8. Lower back pain, chronic   SOCIAL HISTORY:  The patient continues to smoke one-half to one pack per  day.  Denies alcohol use, but does have previous history of alcohol  abuse.   FAMILY HISTORY:  Not available.   REVIEW OF SYSTEMS:  All other comprehensive review of systems are  negative.   ALLERGIES:  The patient has no known drug allergies.   CURRENT MEDICATIONS:  1.  Protonix 40 mg p.o. daily.  2. Tiazac 120 mg p.o. daily.  3. Toprol-XL 100 mg p.o. daily.  4. Hydralazine 25 mg p.o. t.i.d.  5. Glipizide 5 mg p.o. b.i.d.  6. Avodart 5 mg p.o. daily.  7. Coumadin 3 mg p.o. daily.  8. Aspirin 81 mg p.o. daily.   PHYSICAL EXAMINATION:  GENERAL:  This is a well-developed, well-  nourished [place] male currently in no apparent distress.  VITAL SIGNS:  Blood pressure 129/62, heart rate 88, respiratory rate 18,  temperature 99.0.  HEENT:  No jugular venous distention or lymphadenopathy.  Oropharynx is  clear.  Mucous membranes pink and moist.  TMs clear bilaterally.  The  patient does have what looks to be slight amount of scleral icterus.  Otherwise, his extraocular muscles are intact and he has no focal  deficits.  CARDIOVASCULAR:  He has a regular rate with 2/6 systolic ejection murmur  best heard at the left sternal border without radiation.  PULMONARY:  Lungs are clear to  auscultation bilaterally.  ABDOMEN:  Soft, nontender, nondistended without hepatosplenomegaly.  Bowel sounds are present.  He has no rebound or guarding.  EXTREMITIES:  He has no clubbing, cyanosis, or edema.  He has good  peripheral pulses in dorsalis pedis and radial arteries.  He is able to  move all extremities.  NEUROLOGIC:  Cranial nerves II through XII are grossly intact.  He has  no focal neurological deficits.   STUDIES:  EKG shows left ventricular hypertrophy by criteria and normal  sinus rhythm without evidence of acute ischemia or infarction.   LABORATORY DATA:  White count 4.5, hemoglobin of 5.3, hematocrit 19.7,  MCV of 51, platelets of 318.  Sodium 140, potassium 4.1, chloride 106,  bicarb 22, BUN 16, creatinine 1.7 with baseline creatinine of 1.2,  glucose of 115.  UA is clear with no evidence of infection.   ASSESSMENT AND PLAN:  1. Microcytic anemia.  Differential includes thalassemia versus iron      deficiency.  Given his previously normal MCV, I  would heavily      weight towards iron deficiency.  His current anemia panel is      pending, which will include ferritin and thyroid.  The patient will      receive 2 units of packed RBCs as well as a beginning of IV iron      infusion.  The patient is currently not on p.o. iron supplements      and this will be a trial at discharge.  The patient may have had a      low-level bleed for quite some time contributing to this ultimate      degree of anemia.  I do not think that this represents an acute      condition.  2. Acute renal failure on chronic kidney disease, stage III.  The      patient received IV fluid and also volume expander with blood.  We      will recheck his creatinine in a.m.  3. Recurrent lower extremity deep venous thrombosis.  We will hold on      anticoagulation currently until the patient is stabilized.  4. Diabetes mellitus type 2, uncontrolled.  We will check hemoglobin      A1c and lipid panel.  We will defer treatment with insulin at this      time.  We will continue him on his p.o. medication and give him a      regular diet.  5. Hypertension.  Continue Toprol, but discontinue Tiazac.  We will      start captopril given his diabetes.  6. Coronary artery disease.  We will check a BNP as well as CK and      troponin I.  The patient does not have any signs or symptoms      consistent with acute heart failure at this time, and I will defer      any further testing unless his BNP is grossly abnormal.  7. Gastroesophageal reflux disease.  Continue proton pump inhibitor.  8. Benign prostatic hypertrophy.  Continue Avodart.  9. Chronic lower back pain.  We will give pain meds.  10.Tobacco abuse.  Counsel the patient with respect to cigarette      smoking and place the patient on nicotine patch.  11.Depression, currently stable.  12.Disposition.  The patient is full code.   H and P was constructed by reviewing past medical history, conferring  with emergency medical  room physician, reviewing the  emergency medical  record.   TIME SPENT:  One hour.      Renee Ramus, MD  Electronically Signed     JF/MEDQ  D:  12/25/2008  T:  12/26/2008  Job:  [id number]   cc:   Della Goo, M.D.

## 2010-10-01 NOTE — H&P (Signed)
[name], [name]               ACCOUNT NO.:  [id number]   MEDICAL RECORD NO.:  [id number]          PATIENT TYPE:  EMS   LOCATION:  [facility]JO                         FACILITY:  [facility]   PHYSICIAN:  Lucita Ferrara, MD         DATE OF BIRTH:  Mar 04, 1946   DATE OF ADMISSION:  10/12/2007  DATE OF DISCHARGE:                              HISTORY & PHYSICAL   PRIMARY CARE PHYSICIAN:  Della Goo, M.D.   The patient is a 65 year old African-American male who presents to [facility] with a chief complaint of swelling in the right  lower extremity that has been getting progressively worse in the last 3  days.  Apparently the patient states that 4 days ago he ran out of his  Coumadin and he was due to get it from the pharmacy; it was already  called in for him.  He also stated that he had some financial inability  to pay for the medicine in addition to previous.  He noted redness and  swelling today.  He is able to ambulate.  His pain is about a 3/10 in  intensity.  His lower extremity is red but yet it does not look like  there are any cellulitic changes.  He is status post multiple DVTs,  status post [place]field filter.  He denies any chest pain or shortness of  breath.  He denies any recent long travel.  He denies any chest pain,  shortness of breath, fevers.  Otherwise 12-point review of systems  negative.   PAST MEDICAL HISTORY:  Significant for:  1. Coronary artery disease.  2. Hypertension.  3. Diabetes type 2.  4. Depression.  5. History of DVT, status post [place] filter.  6. Gastroesophageal reflux disease.  7. BPH.  8. History of intractable back pain.   PAST SURGICAL HISTORY:  Status post appendectomy, cholecystectomy and  stent placement.   ALLERGIES:  No known drug allergies.   SOCIAL HISTORY:  He currently smokes a half a pack per day.  Denies  drugs or alcohol.   MEDICATIONS AT HOME:  1. Prilosec.  2. Protonix.  3. Toprol XL.  4. Flomax.  5.  Tiazac.  6. Avodart.  7. Hydralazine.  8. Coumadin.   Doses are yet to be determined and are not finalized until confirmed.   PHYSICAL EXAMINATION:  GENERAL:  The patient is in no acute distress.  VITAL SIGNS:  His blood pressure is 133/79, pulse rate 73, respirations  18, temperature 97.5.  HEENT:  Normocephalic, atraumatic.  Sclerae are anicteric.  NECK:  Supple.  No JVD.  No carotid bruits.  PERRLA.  Extraocular  muscles intact.  Mucous membranes are moist.  CARDIOVASCULAR:  S1 and S2, regular rate and rhythm.  No murmurs, rubs  or clicks.  ABDOMEN:  Soft, nontender, nondistended.  Positive bowel sounds.  LUNGS:  Clear to auscultation bilaterally.  No rales, rhonchi or  wheezes.  ABDOMEN:  Soft, nontender, nondistended.  Positive bowel sounds.  EXTREMITIES:  There is redness and swelling in the right lower  extremity.  Pulses are 2+  bilaterally, upper and lower extremities.  NEUROLOGIC:  The patient is alert and oriented x 3.  Cranial nerves II-  XII grossly intact.   Vascular lab done on Oct 12, 2007, showed a right lower extremity venous  duplex completed.  Acute DVT noted in the common femoral, superficial  femoral, profunda and popliteal plus calf veins.  The left common  femoral vein is patent.  The right greater saphenous vein is patent.  INR was 1.0.  His I-STAT included a BUN and creatinine of 16 and 1.9.  Glucose 160.  Calcium, hemoglobin, hematocrit within normal limits.   ASSESSMENT/PLAN:  A 65 year old with a history of deep venous thrombosis  and pulmonary embolism, status post [place]field filter, here with an  acute deep venous thrombosis on the right lower extremity and  subtherapeutic INR, likely secondary to compliance issues and financial  issues. The patient denies any chest pain, shortness of breath, and is  completely otherwise asymptomatic.  1. Acute DVT in the right lower extremity with a history of known PEs      and DVTs.  2. History of recurrent DVTs  and PEs, status post Greenfield filter.  3. History of chronic kidney disease, currently stage 3, with current      creatinine baseline.  4. Diabetes type 2.  5. Coronary artery disease, status post CABG.  6. Current tobacco abuse.  7. Chronic back pain.   PLAN:  1. We will go ahead and admit the patient to medical telemetry unit,      start Coumadin and Lovenox overlap until goal INR is reached.      Given the patient's compliance issues, we will go ahead and get      case management to assess financial issues and social work to      explore barriers to compliance.  His hypertension is moderately      controlled.  We will continue his medications, including      hydralazine and Toprol XL.  2. For history of chronic kidney disease, we will watch his      creatinine.  3. Diabetes type 2.  He is currently on no medications.  We will put      him on sliding-scale insulin, check hemoglobin A1c.  4. For coronary artery disease, status post CABG, continue risk factor      modification.  Currently he has no chest pain or shortness of      breath.  5. Current tobacco abuse.  We will aggressively counsel about tobacco      cessation and nicotine patch.  6. For his history of chronic back pain, pain control as needed.   The rest of the patient's plans are dependent on his progress.      Lucita Ferrara, MD  Electronically Signed     RR/MEDQ  D:  10/12/2007  T:  10/12/2007  Job:  [id number]   cc:   Della Goo, M.D.

## 2010-10-01 NOTE — Discharge Summary (Signed)
[name], [name]               ACCOUNT NO.:  [id number]   MEDICAL RECORD NO.:  [id number]          PATIENT TYPE:  INP   LOCATION:  5004                         FACILITY:  [facility]   PHYSICIAN:  Lonia Blood, M.D.       DATE OF BIRTH:  1945-11-05   DATE OF ADMISSION:  05/07/2007  DATE OF DISCHARGE:  05/10/2007                               DISCHARGE SUMMARY   PRIMARY CARE PHYSICIAN:  Della Goo, M.D.   DISCHARGE DIAGNOSIS:  1. 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  2. 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  3. History of pulmonary embolus status post IVC filter placement in      2070 due to gastrointestinal bleeding.  4. Chronic kidney disease stage III.  5. Newly-diagnosed diabetes mellitus type 2.  6. Hypertension stage III.  7. BPH.  8. Coronary artery disease.   DISCHARGE MEDICATIONS:  1. Metformin 500 mg by mouth twice a day.  2. Coumadin 7.5 mg to be taken at 6:00 p.m. on May 10, 2007, and      then as instructed by Dr. Lovell Sheehan.  3. Lovenox 90 mg injections at 10:00 a.m. and 10:00 p.m. until Dr.      Lovell Sheehan tells the patient to stop.  4. Wellbutrin 150 mg daily.  5. Avodart 0.5 mg daily.  6. Hydralazine 10 mg three times a day.  7. Toprol XL 50 mg daily.  8. Flomax a 0.4 mg at bedtime.  9. Tiazac 120 mg daily.  10.Protonix 40 mg daily.  11.Tylenol 500 mg three times a day.  12.Hydrocodone as needed for severe pain.   CONDITION ON DISCHARGE:  [name] is to be discharged home pending his  approval of Lovenox assistance program.  The patient is to follow up  with Dr. Della Goo the next day after discharge at 1:30 p.m. to  have a PT/INR check and to receive further instructions about his  Coumadin and Lovenox.  The patient is to follow a low-sodium heart-  healthy diet with Coumadin and diabetes restrictions.  The patient is to  have home health PT/OT to help him with his lumbar  spine stenosis.  The  patient is to check his CBGs every morning and record the numbers and  present to Dr. Della Goo.  The patient is to wear TED hoses when  he is standing or sitting.  The patient is to elevate his left lower  extremity as much as possible.  The patient, at this point in time  definitely cannot perform any activities that requires prolonged  standing or sitting.   PROCEDURES DURING THIS ADMISSION:  On May 07, 2007, the patient  underwent a lower extremity venous Doppler ultrasound with findings of  acute left common femoral vein and proximal femoral vein, profunda vein,  deep venous thrombosis.  No evidence of popliteal vein thrombosis and no  evidence of right-sided deep venous thrombosis   CONSULTATION:  During this admission, the patient was seen in  consultation by Dr. Lynne Logan  Botero from Neurosurgery.   HISTORY AND PHYSICAL:  For admission history and physical refer to the  dictated H&P done by Dr. Della Goo.   HOSPITAL COURSE:  #1 - LEFT LOWER EXTREMITY PAIN AND EDEMA.  [name]  was admitted on the night of May 07, 2007.  Immediately, upon  admission, he had ultrasound of the left lower extremity which showed an  acute nonocclusive deep venous thrombosis.  Clinically, we diagnosed him  with phlegmasia alba dolens.  [name] was started immediately on  anticoagulation after all the risk and benefits were thoroughly  discussed with him.  His left lower extremity was elevated all times.  The patient never went into the syndrome called phlegmasia cerulea  dolens.  He is to elevate his left lower extremity as much as possible  and continue anticoagulation in the outpatient setting.  The case of Mr.  [name] was discussed with his primary care physician and the importance  of close follow-up was stressed to the patient.  The previous episode of  gastrointestinal bleeding was reviewed and it did not appear that Mr.  [name] had significant  gastrointestinal pathology to predict recurrence  of his bleeding.  In any case, the patient knows to stop the  anticoagulation immediately and report to emergency room in case he  starts having melena or hematochezia and/or hematemesis.   #2 - NEWLY DIAGNOSED DIABETES MELLITUS TYPE 2.  [name], upon  admission had a glucose level of above 300.  His fasting plasma glucose  was 155.  His measured hemoglobin A1c 7.9.  The patient also has  abdominal obesity, and it is clear at this point in time that he is a  newly diagnosed diabetic mellitus type 2.  [name] was provided with  education.  He was referred for outpatient classes.  He was started on  metformin 500 mg twice a day and he is to follow up with his primary  care physician for further treatment and care.   #3 - HYPERTENSION STAGE III.  [name] was admitted on two  antihypertensives.  His blood pressure level was not controlled in the  hospital, so we added a third antihypertensive.  I am afraid to add a  diuretic now since the patient has chronic kidney disease.  I guess in  the outpatient setting with careful follow-up [name] may benefit  from Lasix.   #4 - SPINAL STENOSIS.  Prior to the admission, [name] had MRI of the  spine which showed L4-S1 spinal stenosis.  [name] was evaluated by  Dr. Jeral Fruit from neurosurgery who recommended home health PT/OT, pain  control and follow-up in 8 weeks as needed in his office.      Lonia Blood, M.D.  Electronically Signed     SL/MEDQ  D:  05/10/2007  T:  05/10/2007  Job:  [id number]   cc:   Hilda Lias, M.D.  Della Goo, M.D.

## 2010-10-01 NOTE — Discharge Summary (Signed)
[name], [name]               ACCOUNT NO.:  [id number]   MEDICAL RECORD NO.:  [id number]          PATIENT TYPE:  INP   LOCATION:  5023                         FACILITY:  [facility]   PHYSICIAN:  Beckey Rutter, MD  DATE OF BIRTH:  09/25/1945   DATE OF ADMISSION:  01/12/2009  DATE OF DISCHARGE:  01/18/2009                               DISCHARGE SUMMARY   PRIMARY CARE PHYSICIAN:  Della Goo, MD   Please amend this discharge summary to the previously dictated interim  discharge summary as a progress note on January 15, 2009 by Dr. Abram Sander.   DISCHARGE DIAGNOSES:  1. Right lower extremity DVT status post IVC filter previously.  2. Recurrent DVT, etiology unclear.  3. Iron-deficiency anemia.  4. Coronary artery disease.  5. Hypertension.  6. Benign prostatic hyperplasia.  7. Depression.  8. Diabetes type 2.  9. Recent history of Methicillin-resistant Staphylococcus aureus      infection.   DISCHARGE MEDICATIONS:  1. Protonix 40 mg daily.  2. Tiazac 120 mg daily.  3. Toprol XL 100 mg daily.  4. Hydralazine 25 mg 3 times daily.  5. Glipizide/Glucotrol 5 mg twice a day.  6. Avodart 5 mg daily.  7. Coumadin 3 mg at bedtime.  8. Aspirin Bayer 81 mg daily.  9. Ferrous sulfate 325 mg 3 times a day.   DISCUSSION AND DISCHARGE PLAN:  The patient still has a capsule result  pending.  The patient is aware of the need to follow up with  Gastroenterology to review the result.  He was seen here and endoscopy  done by Dr. Rob Bunting.   The patient was seen by Dr. Edilia Bo for his recurrent DVT.  Thrombolysis  was contemplated for sometimes but the final decision is long-term  Coumadin, probably for life.   Workup for hypercoagulable profile is not done pending the patient  improvement.   The patient will be discharged today on Coumadin 3 mg.  He would follow  up the INR on a daily basis.  He would follow up with the primary  physician Dr. Della Goo for further  adjustment of his Coumadin  according to the INR.  Please note, his INR today is 1.1.  Nevertheless,  the patient will be discharged on Coumadin only because Coumadin was  started here since August 27.  Furthermore, the patient was taking  Coumadin previously so the need for bridge with Lovenox is not necessary  at this time, he is outside the window.   The patient is aware of the fact that he would follow up with Vascular  Surgery, Dr. Edilia Bo, Dr. Christella Hartigan, and Dr. Lovell Sheehan.  He is aware and  agreeable to the discharge and followup plans.      Beckey Rutter, MD  Electronically Signed     EME/MEDQ  D:  01/18/2009  T:  01/19/2009  Job:  [id number]   cc:   Della Goo, M.D.

## 2010-10-01 NOTE — Assessment & Plan Note (Signed)
OFFICE VISIT   [name], [name]  DOB:  04/20/1946                                       08/23/2009  YNWGN#:[id number]   I saw the patient in the office today for continued followup of his  chronic venous insufficiency.  He had a previous right lower extremity  DVT in August of 2010.  He had been on chronic Coumadin therapy as he  has had previous DVTs.  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  He has been on chronic Coumadin therapy  which is followed by Dr. Pecola Leisure.  Most recently in the end of February he  had some increasing pain in his right calf and this pain was alleviated  with Aleve and leg elevation.  He is no longer having significant pain  in the right calf.  He continues to have swelling in the right leg which  is aggravated by standing and alleviated with leg elevation.  He does  wear compression stockings and also has a leg elevation pillow.  He has  had no history of claudication, rest pain or nonhealing ulcers.  He has  had no new swelling in his lower extremities.   SOCIAL HISTORY:  He is single.  He has one son.  He smokes a pack per  day of cigarettes and has been smoking since he was 65 years old.   REVIEW OF SYSTEMS:  CARDIOVASCULAR:  He has had no chest pain, chest  pressure, palpitations or arrhythmias.  He has had no orthopnea or  dyspnea on exertion.  He has had no history of stroke or TIAs.  PULMONARY:  He has had no productive cough, bronchitis, asthma or  wheezing.   PHYSICAL EXAMINATION:  General:  This is a pleasant 65 year old  gentleman who appears his stated age.  Vital signs:  Blood pressure is  167/120, heart rate is 84, saturation 98%.  HEENT:  Unremarkable.  Lungs:  Are clear bilaterally to auscultation without rales, rhonchi or  wheezing.   Cardiovascular:  I do not detect any carotid bruits.  He has  a regular rate and rhythm.  He has palpable femoral, popliteal and pedal  pulses bilaterally.  He has moderate swelling in the right leg which is  improved since I saw him last.  He has no venous ulcers are significant  varicose veins noted.  Neurologic:  Exam is nonfocal.   We have again stressed the importance of continued use of compression  therapy and leg elevation.  He is also instructed to keep the skin well  lubricated.  Given his multiple history of DVTs I would recommend  continued chronic Coumadin therapy unless he has problems with GI bleed,  but he has had no problems with this since he has been on Coumadin after  his hospitalization in August.  I will see him back as needed for any  new vascular issues.  He will continue to follow up with Dr. Pecola Leisure.     Di Kindle. Edilia Bo, M.[name].  Electronically Signed   CSD/MEDQ  [name]:  08/23/2009  T:  08/24/2009  Job:  [id number]   cc:   Jocelyn Lamer [name]. Pecola Leisure,  M.[name]. 

## 2010-10-01 NOTE — H&P (Signed)
NAMEGILLIE, CRISCI               ACCOUNT NO.:  [id number]   MEDICAL RECORD NO.:  [id number]           PATIENT TYPE:  INP   LOCATION:  5023                         FACILITY:  [facility]   PHYSICIAN:  Oswald Hillock, MD        DATE OF BIRTH:  Aug 20, 1945   DATE OF ADMISSION:  01/11/2009  DATE OF DISCHARGE:                              HISTORY & PHYSICAL   PRIMARY CARE PHYSICIAN:  Della Goo, MD   CHIEF COMPLAINT:  Right lower extremity swelling.   HISTORY OF PRESENT ILLNESS:  The patient is a 65 year old African  American gentleman with known history of recurrent DVTs who presents to  the emergency room with complaints of right leg swelling.  The patient  denies any trauma and states that he likely has recurrence of his blood  clots.  Apparently, the patient has been off anticoagulation since the  9th of this month though, he restarted Coumadin a few days back.  He had  been admitted with a diagnosis of severe iron deficiency anemia and  requiring transfusions.  He was also found to have acute renal failure  at that time.  He denies any other symptoms.  No chest pain.  No  shortness of breath, palpitation, dizziness, loss of consciousness, or  any focal weakness of any part of the body.   EMERGENCY ROOM COURSE:  Initial eval in the ER included lower extremity  Doppler that confirmed extensive clot in the common femoral, popliteal,  and posterior tibial veins.  He did receive a dose of Lovenox in the ER.   PAST MEDICAL HISTORY:  1. Significant for recurrent DVT, status post [place] filter.  2. Diabetes mellitus type 2, uncontrolled.  3. Coronary artery disease with cath in 2007 showing nonobstructive      disease and an echo in 2007 showing an EF of 50% without wall      motion abnormalities.  4. Hypertension.  5. Depression.  6. GERD.  7. Benign prostatic hypertrophy.  8. Low back pain, chronic   SOCIAL HISTORY:  The patient continues to smoke one-half to one pack per  day.   Denies alcohol or drug use.   FAMILY HISTORY:  No significant family history reported by the patient.   CURRENT MEDICATIONS:  1. Ferrous sulfate 325 mg 3 times a day.  2. Protonix 40 mg daily.  3. Tiazac 120 mg daily.  4. Toprol XL 100 mg daily.  5. Hydralazine 25 mg 3 times a day.  6. Glipizide 5 mg twice a day.  7. Avodart 5 mg daily.   ALLERGIES:  No known drug allergies.   REVIEW OF SYSTEMS:  An extensive review of systems was done and all  systems are negative except for the positives mentioned in the history  of present illness.   PHYSICAL EXAMINATION:  VITAL SIGNS:  On admission, pulse 86, blood  pressure 147/88, respiratory rate of 16, temperature 98, and O2  saturations of 96% on room air.  GENERAL:  The patient is conscious, alert, oriented to time, place, and  person in no significant distress at the time  of this interview.  HEENT:  No scleral icterus.  No pallor.  Ears, negative.  No significant  oropharyngeal lesions.  NECK:  Supple.  No lymphadenopathy.  No JVD.  No carotid bruits.  CHEST:  Breath sounds heard bilaterally.  Free air entry, occasional  rhonchi.  CVS:  S1 and S2 plus and regular, 2/6 systolic ejection murmur, heard  best in the left upper sternal border.  ABDOMEN:  Soft, nontender, nondistended.  Bowel sounds present.  EXTREMITIES:  The patient's right lower extremity is swollen about 1 to  2+ with some erythema.  No localized tenderness.  Positive Hoffmann  sign.  NEUROLOGIC:  Cranial nerves II through XII are grossly intact.  No focal  motor or sensory deficits noted on gross examination.   LABORATORY DATA:  His D-dimer was 4.49.  His PT was 13.4 and INR of 1.2.  Sodium 140, potassium 4.2, BUN 14, creatinine 1.4, glucose 134, and  calcium 1.17.  Hemoglobin 18.4 and hematocrit 54.   Lower extremity Doppler revealed extensive clot in the common femoral,  profunda, popliteal, and posterior tibial veins.   IMPRESSION AND PLAN:  1. Recurrent  deep venous thrombosis.  The patient has had history of      recurrent deep venous thromboses and current clot is likely because      of him being off anticoagulation.  Given his extensive history, we      will admit him for overnight observation, especially given his most      recent admission for severe anemia.  We will need to monitor his H      and H while he is on anticoagulation initially.  The patient will      be continued on Lovenox per pharmacy dosing, and we will increase      his dose of Coumadin to 5 mg daily.  Check PT/INR daily and make      further recommendations based on that.  2. Diabetes mellitus type 2.  We will continue his glyburide and put      him on sliding-scale insulin coverage with regular insulin      sensitive scale.  3. Hypertension.  Continue current medications.  4. Coronary artery disease, asymptomatic currently.  We will continue      current medications including beta blocker and aspirin.  5. Gastroesophageal reflux disease.  Continue Protonix.  6. Anemia.  The patient's H and H is 18.4 and 54.  We will repeat his      H and H, monitor H and H q.12 h. given his recent history of anemia      and continue ferrous sulfate.  7. Renal insufficiency.  Creatinine appears stable.  We will monitor.  8. Deep venous thrombosis/gastrointestinal prophylaxis.  The patient      is already on Lovenox and Coumadin and Protonix for GI prophylaxis.      Oswald Hillock, MD  Electronically Signed     BA/MEDQ  D:  01/12/2009  T:  01/13/2009  Job:  [id number]   cc:   Della Goo, M.D.

## 2010-10-01 NOTE — Assessment & Plan Note (Signed)
OFFICE VISIT   Thackston, Leeum D  DOB:  07/06/1945                                       02/22/2009  [id number]#:[id number]   I saw the patient in the office today for continued followup of his  chronic venous insufficiency and right lower extremity DVT.  I saw him  in consult in the hospital on 01/16/2009.  He had an extensive right  lower extremity DVT.  He has had problems with iron deficiency anemia  and also on upper endoscopy was found to have an AVM in the second  portion of the duodenum.  All things considered ultimately we decided  the best approach would be chronic Coumadin therapy and not  thrombolysis.  He comes in for a routine visit.  He has continued to  have significant swelling in the right leg and this has worsened when he  is on his feet.  For example if he walks across the street to [employer]  by the time he gets back his swelling is significantly worse.  He has  had no fever or chills.  He is on his Coumadin.   REVIEW OF SYSTEMS:  He has had no chest pain or shortness of breath.   PHYSICAL EXAMINATION:  This a pleasant 65 year old gentleman who appears  his stated age.  Blood pressure is 139/89, heart rate is 105.  His  abdomen is slightly distended but nontender.  He has palpable femoral  and popliteal pulses with warm and well-perfused feet and biphasic  Doppler signals in both feet.  He has significant right lower extremity  swelling.   We have again discussed the importance of intermittent leg elevation and  I have counseled him on the proper position for this.  In addition, I  have written him a prescription for knee-high compression stockings with  a 20-30 mm blood pressure gradient.  I have also instructed him to keep  the skin well lubricated.  He will continue his Coumadin.  I plan on  seeing him back in 6 months.  He knows to call sooner if he has  problems.   Di Kindle. Edilia Bo, M.D.  Electronically Signed   CSD/MEDQ  D:   02/22/2009  T:  02/23/2009  Job:  [id number]

## 2010-10-01 NOTE — Group Therapy Note (Signed)
[name], [name]               ACCOUNT NO.:  [id number]   MEDICAL RECORD NO.:  [id number]          PATIENT TYPE:  INP   LOCATION:  5023                         FACILITY:  [facility]   PHYSICIAN:  Ruthy Dick, MD    DATE OF BIRTH:  [birth year]                                 PROGRESS NOTE   PROBLEM LIST TO DATE:  1. Right lower extremity DVT status post IVC filter in the past.  2. Recurrent DVTs etiology unknown.  3. Iron deficiency anemia.  GI on board.  The patient had EGD and      colonoscopy already.  4. Coronary artery disease.  5. Hypertension.  6. Benign prostatic hyperplasia.  7. Depression.  8. Diabetes mellitus type 2.  9. Recent MRSA skin infection.   CONSULTS DURING THIS ADMISSION:  GI consult.   PROCEDURES DONE DURING THIS ADMISSION:  1. 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  2. Colonoscopy which showed normal colon.  Recommendations at this      time is for the patient to undergo a capsule endoscopy.   BRIEF HISTORY OF PRESENT ILLNESS AND HOSPITAL COURSE:  This is a 65 year old African American male with a known history of  recurrent DVTs who came to the emergency room with complaints of  swelling in the right lower extremity.  The patient was found to have  findings consistent with chronic deep vein thrombosis involving the  right lower extremity.  Because of this the patient was readmitted for  anticoagulation.  Recently the patient was admitted to the hospital  again for severe iron-deficiency anemia and was transfused.  Since then  his H and H has remained stable but because of his anemia has not been  established.  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  However, the GI team is also planning to do a capsule  endoscopy and also a CT scan of the abdomen and pelvis to rule out  occult malignancy as the reason behind his recurrent DVTs.  Today the  patient says he has no complaints.  His leg seems to have improved  tremendously on anticoagulation.  He is presently only on Lovenox  because of the plan for endoscopy.  Now that endoscopy has been done we  are planning to now restart Coumadin on this patient.  The patient  already has an IVC filter in place and will probably need  anticoagulation to stop recurrent DVTs.   His vitals today are temperature 98.2, pulse 71, respirations 20, blood  pressure 160/92, saturating 96% on room air.  CHEST:  Clear to auscultation bilaterally.  ABDOMEN:  Soft, nontender.  EXTREMITIES:  No clubbing, no cyanosis, no edema.  CARDIOVASCULAR:  First and second heart sounds only.   This patient is on the following medications albuterol inhaler,  albuterol nebulizations, aspirin, Cardizem, doxycycline, Avodart,  therapeutic Lovenox, ferrous sulfate, glipizide, hydralazine, insulin  NovoLog, metoprolol, Protonix, which  has now been increased to b.i.d.      Ruthy Dick, MD  Electronically Signed     GU/MEDQ  D:  01/15/2009  T:  01/15/2009  Job:  [id number]

## 2010-10-01 NOTE — Discharge Summary (Signed)
NAMEJAHMANI, [name]               ACCOUNT NO.:  [id number]   MEDICAL RECORD NO.:  [id number]          PATIENT TYPE:  INP   LOCATION:  4736                         FACILITY:  [facility]   PHYSICIAN:  Peggye Pitt, M.D. DATE OF BIRTH:  07/19/1945   DATE OF ADMISSION:  12/25/2008  DATE OF DISCHARGE:  12/26/2008                               DISCHARGE SUMMARY   PRIMARY CARE PHYSICIAN:  Della Goo, MD   DISCHARGE DIAGNOSES:  1. Severe iron-deficiency anemia status post 2 units of packed red      blood cells and intravenous iron.  2. Recurrent deep vein thrombosis status post inferior vena cava      filter.  3. Hypertension.  4. Benign prostatic hypertrophy.  5. Depression.  6. Type 2 diabetes mellitus.  7. Coronary artery disease with a catheterization in 2007 showing      nonobstructive disease.   Discharge medications include:  1. Ferrous sulfate 325 mg 3 times a day.  2. Protonix 40 mg daily.  3. Tiazac 120 mg daily.  4. Toprol-XL 100 mg daily.  5. Hydralazine 25 mg 3 times a day.  6. Glipizide 5 mg 2 times a day.  7. Avodart 5 mg daily.   DISPOSITION AND FOLLOWUP:  [name] will be discharged home today in  stable condition.  He needs to follow up with Dr. Della Goo.  He  will need an outpatient referral to Gastroenterology for a colonoscopy  and will need close followup on his CBC.   CONSULTATION THIS HOSPITALIZATION:  None.   IMAGES AND PROCEDURES:  None.   HISTORY AND PHYSICAL EXAMINATION:  For full details, please see  dictation by Dr. Janice Norrie on December 25, 2008, but in brief [name] is a  65 year old African American gentleman who came into the hospital with  complaints of lightheadedness and near syncopal episode.  No fevers,  chills, night sweat, nausea, vomiting, chest pain, shortness of breath.  He denied any black or tarry stools.  He does have a history of  recurrent DVTs and has been maintained on chronic anticoagulation and  has an IVC  filter; however, his INR upon admission was 1.0.  He was  found to be severely anemic, and hence we were called to admit him for  further evaluation and management.   HOSPITAL COURSE:  1. Severe iron deficiency anemia.  His hemoglobin upon admission was      5.3 with an MCV of 51.  An anemia panel has shown his ferritin to      be 8, a vitamin B12 level of 784, and a serum iron of less than 10      as well as a folate of greater than 20.  He has received a total of      2 units of PRBCs.  His hemoglobin is now up to 7.1, MCV of 55.4.      He also received 2 doses of IV iron.  I will discharge him today on      ferrous sulfate 325 mg 3 times a day.  I have discussed with the  patient his need for a colonoscopy. Given the fact that his FOBT is      negative, I believe that there is no urgency for this to be done in      the inpatient setting.  I have, however, relayed to him that he      needs to have close followup with his primary care physician, Dr.      Lovell Sheehan, for a prompt GI referral.  Please note that the patient      states that over 10 years ago he had a colonoscopy and he is not      sure of the results.  I do not have any endoscopic reports in e-      chart.  His symptoms of anemia have resolved.  He is no longer      lightheaded.  He has been walking down the hall with physical      therapy and has not had any balance issues or dizziness.  He does      have a history of coronary artery disease but the findings were of      nonobstructive disease, and hence I feel comfortable letting him go      home with hemoglobin of 7.1 given the chronicity of this issue.  2. For recurrent the DVTs, he is status post an IVC filter, and he has      been maintained on Coumadin; however, his INR was subtherapeutic      upon admission.  Given his severe iron deficiency anemia, at this      point I would prefer to hold his Coumadin until a colonoscopy and      possibly endoscopy can be done  to see if there is any cause for the      chronic kidney.  3. Hypertension.  His blood pressure has been slightly elevated in the      140s-80s.  I have not altered his home antihypertensive regimen.  4. For his GERD, he has been continued on his proton pump inhibitor.  5. Type 2 diabetes mellitus.  He has been kept on his glipizide.  He      has been well controlled while in the hospital.  6. Rest of chronic medical issues have not been a problem this      hospitalization.   VITAL SIGNS UPON DISCHARGE:  Blood pressure 141/85, heart rate 75,  respirations 20, O2 sats 94% on room air with a temp of 97.7.   LABORATORY DATA ON DAY OF DISCHARGE:  Sodium 138, potassium 3.9,  chloride 107, bicarb 24, BUN 11, creatinine 1.48 with a glucose of 68.  All of his LFTs are within normal limits with the exception of an  albumin of 3.0.  WBCs 4.3, hemoglobin of 7.1 with an MCV of 65.4, a  platelet count of 268.      Peggye Pitt, M.D.  Electronically Signed     EH/MEDQ  D:  12/26/2008  T:  12/27/2008  Job:  [id number]

## 2010-10-01 NOTE — Discharge Summary (Signed)
[name], SCHWANDT               ACCOUNT NO.:  [id number]   MEDICAL RECORD NO.:  [id number]          PATIENT TYPE:  INP   LOCATION:  2034                         FACILITY:  [facility]   PHYSICIAN:  Lonia Blood, M.D.DATE OF BIRTH:  Apr 04, 1946   DATE OF ADMISSION:  10/12/2007  DATE OF DISCHARGE:  10/18/2007                               DISCHARGE SUMMARY   PRIMARY CARE PHYSICIAN:  Della Goo, M.D.   DISCHARGE DIAGNOSES:  1. Recurrent lower extremity DVT.      a.     Previous history of Greenfield filter placement.      b.     History of noncompliance with medications.      c.     Readmit with extensive right lower extremity DVT and       subtherapeutic INR.      d.     Lovenox to Coumadin bridging therapy as inpatient with INR       1.9 at discharge.  2. Diabetes mellitus - poorly controlled - compliance encouraged  3. Coronary artery disease.  4. Hypertension.  5. Depression.  6. Gastroesophageal reflux disease.  7. Benign prostatic hypertrophy.  8. History of chronic intractable back pain.  9. Status post appendectomy.  10.Status post cholecystectomy.   ALLERGIES:  NO KNOWN DRUG ALLERGIES.   DISCHARGE MEDICATIONS:  1. Tiazac 120 mg p.o. daily.  2. Toprol XL 1 mg daily.  3. Hydralazine 25 mg t.i.d.  4. Avodart 0.5 mg daily.  5. Flomax 0.4 mg daily.  6. Warfarin 3 mg p.o. daily.  7. Glipizide 5 mg p.o. b.i.d.  8. Aspirin 81 mg daily.  9. Protonix 40 mg p.o. daily.  10.Glucophage 500 mg p.o. b.i.d.   CONSULTATIONS:  None.   PROCEDURES:  Bilateral lower extremity venous Doppler evaluations Oct 12, 2007 - acute DVT noted in the common femoral, superficial femoral,  profunda femoris, popliteal and calf veins in the right.  Left common  femoral vein is patent.  Right greater saphenous vein is patent.   FOLLOW UP:  1. The patient is advised to follow up with Dr. Della Goo      within 5 days.  At that time, reevaluation of the patient's INR      should  be carried out.  Additionally, the patient's CBG should be      followed closely to assure that his blood sugars well-controlled      with the addition of his Glucophage therapy.  B-met should be      accomplished within 46 weeks to assure the patient is tolerating      his Glucophage without deleterious side effects.  2. The patient is to report to [facility] the day following his      discharge (Tuesday, October 19, 2007) to have INR checked as per his      usual protocol with results reported to Dr. Lovell Sheehan' office.   HOSPITAL COURSE:  [name] is a pleasant 65 year old gentleman  with a complex medical history as noted above.  The patient reported  hospital complaints of severe right lower extremity  swelling which have  become progressively worse in the 3 days prior to his presentation.  He  states that he had been out of his Coumadin for at least 4 days due to  financial issues.  He also reported difficulty with delays through the  pharmacy in having his Coumadin refilled.  Bilateral lower extremity  Dopplers were carried out and did reveal extensive right lower extremity  DVT.  The patient was counseled extensively as to the absolute need for  ongoing strict adherence to his Coumadin therapy.  Full dose Lovenox was  initiated and Coumadin therapy was also initiated.  Right lower  extremity venous compression stocking was added to assist with severe  lower extremity edema.  No further complications were encountered during  the hospital stay relative to the patient's extensive DVTs.  At such  time, his INR was therapeutic at 1.9.  He was cleared for discharge  home.  Again, the extreme importance of very close adherence with his  Coumadin therapy was discussed with the patient.   At the time of admission, it was appreciated the patient was diabetic.  Hemoglobin A1c was assessed and was found to be significantly elevated  at 7.1.  Glucophage was added to the patient's diabetes  regimen.  He  tolerated this addition without difficulty.  At the time of his  discharge his CBGs were ranging between 85 and 150 with the occasional  peak to 209.  CBGs should be followed closely in the outpatient setting,  and these results should be reported to the patient's primary care  physician to allow further adjustment of treatment as appropriate.   On October 18, 2007, the patient was cleared for discharge home.  He was  asymptomatic and vital signs were stable.  Right lower extremity edema  had decreased significantly though he did remain asymmetrically  edematous compared to the left at approximately 1+.  O2 sats were  stable.  The patient had no chest pain, fevers, chills, nausea or  vomiting.  The importance of strict medical adherence was again  discussed with the patient and he was then cleared for discharge home.      Lonia Blood, M.D.  Electronically Signed     JTM/MEDQ  D:  10/18/2007  T:  10/18/2007  Job:  [id number]   cc:   Della Goo, M.D.

## 2010-10-01 NOTE — Consult Note (Signed)
NAMEPHAROAH, [name]               ACCOUNT NO.:  [id number]   MEDICAL RECORD NO.:  [id number]          PATIENT TYPE:  INP   LOCATION:  5004                         FACILITY:  [facility]   PHYSICIAN:  Hilda Lias, M.D.   DATE OF BIRTH:  1946/05/19   DATE OF CONSULTATION:  05/07/2007  DATE OF DISCHARGE:                                 CONSULTATION   NOTE:  This is a neurosurgery consult.   HISTORY:  We were called to consult on [name] who is a 65 year old  gentleman complaining of some back pain with radiation to the right leg.  The patient had an MRI of the lumbar spine and it showed stenosis with  lipomatous from L2 down to L5-S1 worse at the L4-5.  By the time I saw  [name] he is in bed.  He tells me he has no back pain, no right leg  pain but he is really concerned about the amount of swelling of the left  leg.  Clinically I cannot identify any weakness.  Sensation was normal.  I found the left leg is swollen.  It is almost twice the size of the  right leg all the way down from the groin to the toes with edema.  No  pulses.  The lumbar spine x-ray showed lumbar stenosis with facet  arthropathy from L1 down to L5-S1 worse at the L4-5.  The patient has  lumbar stenosis.  Rule out DVT of the left leg, rule out arterial  occlusion.  Immediately I called Dr. Lavera Guise at his beeper and I told him  that indeed the low back stenosis is present but that is not the  problem.  I think this gentleman needs emergency consult with the  vascular surgeon to find out what is going on with the left leg.  At the  present time the stenosis is not the problem and this also can be  followed up in the office.           ______________________________  Hilda Lias, M.D.     EB/MEDQ  D:  05/07/2007  T:  05/09/2007  Job:  [id number]

## 2010-10-01 NOTE — Consult Note (Signed)
[name], [name]               ACCOUNT NO.:  [id number]   MEDICAL RECORD NO.:  [id number]          PATIENT TYPE:  INP   LOCATION:  5023                         FACILITY:  [facility]   PHYSICIAN:  Di Kindle. Edilia Bo, M.D.DATE OF BIRTH:  10/12/1945   DATE OF CONSULTATION:  01/16/2009  DATE OF DISCHARGE:                                 CONSULTATION   REASON FOR CONSULTATION:  Right lower extremity DVT, evaluate for  potential thrombolysis.   HISTORY:  This is a pleasant 65 year old gentleman who was admitted on  January 11, 2009, with right lower extremity swelling.  He underwent a  venous duplex scan, which showed extensive clot in the right common  femoral vein, popliteal vein, and posterior tibial veins.  He was  admitted and started on Lovenox and Coumadin.  This patient has a  history of recurrent DVTs.  He states that he had a DVT in the left  lower extremity in 2004.  In approximately 2005, he had his first right  lower extremity DVT.  In 2007, he had another DVT in the right leg.  He  had been on Coumadin for each of these DVTs and when it was restarted  for his most recent DVT in 2007, the plan I believe was to continue this  indefinitely.  However, the patient later presented with iron deficiency  anemia and for this reason, his Coumadin was discontinued.  He  subsequently developed swelling in the right leg and was admitted with a  DVT.  Of note, he states that in 2005, he had an IVC filter placed.  He  is not sure if there was some contraindication anticoagulation.  At that  time, he does not think that there was.   During the hospitalization to workup his iron deficiency anemia, he has  been evaluated by Gastroenterology and underwent a colonoscopy I believe  yesterday, which showed normal colon.  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  There was also some hemorrhagic  mucosa in the cardia and also esophagitis.  A capsule endoscopy was  recommended.   Yesterday, he also had CT scan of the abdomen and pelvis, and it was  noted on that there was no evidence of malignancy.  The IVC filter was  in good position and the IVC was patent without thrombus.  The right  common femoral vein and external iliac vein on the right contained  thrombus and there appeared to be some narrowing in the right common  iliac vein approximately.  Vascular Surgery was consulted to consider  thrombolysis.   PAST MEDICAL HISTORY:  In addition to his history of recurrent clotting  problems is significant for:  1. Type 2 diabetes, which has been controlled.  2. History of coronary artery disease.  He had a cath in 2007, which      showed nonobstructive disease.  3. In addition he has hypertension.  4. Hypercholesterolemia.  5. History of reflux.  6. History of BPH.  7. History of chronic low back pain.  8. He denies any history of previous myocardial infarction.  9. History of congestive heart failure.  10.History of COPD.   SOCIAL HISTORY:  He is married, he has one 46 year old son.  He smokes a  half-a-pack per day of cigarettes and states that he has been smoking  since he was about 65 years old.   MEDICATIONS ON ADMISSION:  Ferrous sulfate, Protonix, Tiazac, Toprol-XL,  hydralazine, glipizide, and Avodart.   ALLERGIES:  No known drug allergies.   REVIEW OF SYSTEMS:  GENERAL:  He had no recent weight loss, weight gain  or problems with appetite.  CARDIAC:  He has had no chest pain, chest  pressure, palpitations or arrhythmias.  PULMONARY:  He has had no  productive cough, bronchitis, asthma, or wheezing.  GI:  He denies any  history of hematochezia, melena, or hemoptysis.  He has had no recent  change in his bowel habits and no history of peptic ulcer disease that  he is aware of.  He does have a history of reflux.  GU:  He has BPH.  He  has had no significant dysuria.  He has some nocturia.  VASCULAR:  He  denies claudication, rest  pain, or nonhealing ulcers.  He has had no  history of stroke, TIAs, or amaurosis fugax.  NEUROLOGIC:  He has had no  dizziness, blackouts, headaches, or seizures.  HEMATOLOGIC:  He has had  clotting problems as discussed above.  ENT, Endocrine, and review of  systems are unremarkable.   PHYSICAL EXAMINATION:  GENERAL:  This is a pleasant 65 year old  gentleman who appears his stated age.  VITAL SIGNS:  His temperature is 97.5, blood pressure 136/76, and heart  rate is 64.  HEENT:  Unremarkable.  NECK:  Supple.  I do not detect any carotid bruits.  LUNGS:  Clear bilaterally to auscultation.  CARDIAC:  He has a regular rate and rhythm.  ABDOMEN:  Soft and nontender.  He currently has the camera on for  capsule endoscopy.  I cannot palpate an aneurysm.  He has normal pitched  bowel sounds.  He has palpable femoral, popliteal, and pedal pulses.  He  has right lower extremity swelling up to the groin, which is moderate.  There is no vascular compromise.  NEUROLOGIC:  Nonfocal.   I have reviewed his CT scan with results as described above.   IMPRESSION:  This patient presents with recurrent deep venous thrombosis  of the right lower extremity.  This is the third clot on the right side  and this is likely related to stopping his Coumadin, which was done  because of his iron-deficiency anemia.  Given his repeated clots, I  suspect he will require lifetime Coumadin unless there is some  Gastrointestinal contraindication.  Of note, he does have an inferior  vena cava filter, which is in good position.  I think, it would be worth  obtaining a hypercoagulable workup at some point, although it may be  best to do this after this admission after this acute clot.  With  respect to considering thrombolysis, the potential advantage of this is  that this could decrease his risk of long-term venous hypertension.  There is also a small chance that if it were successful an iliac  stenosis could be  identified, which could be successfully ballooned  and/or stented to decrease his risk of further deep venous thromboses in  the right lower extremity.  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  I will also discuss this with Interventional Radiology.  Also,  the results of his capsule endoscopy are pending.  In the meantime, I  would agree with continued anticoagulation with Coumadin and Lovenox  until the Coumadin is therapeutic.  We have also discussed the  importance of leg elevation.  I will follow during this admission.      Di Kindle. Edilia Bo, M.D.  Electronically Signed    CSD/MEDQ  D:  01/16/2009  T:  01/17/2009  Job:  [id number]   cc:   Ruthy Dick, MD  Della Goo, M.D.

## 2010-10-01 NOTE — H&P (Signed)
[name], [name]               ACCOUNT NO.:  [id number]   MEDICAL RECORD NO.:  [id number]          PATIENT TYPE:  INP   LOCATION:  5023                         FACILITY:  [facility]   PHYSICIAN:  Oswald Hillock, MD        DATE OF BIRTH:  1946/02/23   DATE OF ADMISSION:  01/12/2009  DATE OF DISCHARGE:                              HISTORY & PHYSICAL   PRIMARY CARE PHYSICIAN:  Della Goo, MD   CHIEF COMPLAINT:  Right lower extremity swelling.   HISTORY OF PRESENT ILLNESS:  The patient is a 65 year old African  American male with known history of recurrent DVT who presents to  emergency room with complaints of swelling in the right lower extremity.  The patient believes that he may have a recurrence of his blood clots as  he has had similar symptoms in the past.  He denies any chest pain,  shortness of breath, palpitations, dizziness, loss of consciousness, or  any focal weakness of any part of the body.  Apparently, the patient was  taken off anticoagulation after he was admitted earlier in the month for  a diagnosis of severe iron-deficiency anemia necessitating blood  transfusions.  He was recently restarted on Coumadin and has been taking  3 mg daily.   EMERGENCY ROOM COURSE:  Initial evaluation in the ER revealed an  extensive clot in the right lower extremity extending to common femoral,  profunda, popliteal, and posterior tibial veins.  He received a dose of  Lovenox in the ER and at this point of time, he is asymptomatic.   PAST MEDICAL HISTORY:  1. Recurrent DVT, status post Greenfield filter placement.  2. Diabetes mellitus type 2, uncontrolled.  3. Coronary artery disease with cath in 2007 showing nonobstructive      disease and an echo in 2007 showing an EF of 50%.  4. Hypertension.  5. Depression.  6. GERD.  7. Benign prostatic hypertrophy.  8. Low back pain, chronic.   SOCIAL HISTORY:  The patient continues to smoke one-half to one pack per  day.  Denies any  alcohol or drug use.   FAMILY HISTORY:  No significant family history reported by the patient.   CURRENT MEDICATIONS:  1. Ferrous sulfate 325 mg 3 times a day.  2. Protonix 40 mg daily.  3. Tiazac 120 mg daily.  4. Toprol-XL 100 mg daily.  5. Hydralazine 25 mg 3 times a day.  6. Glipizide 5 mg twice a day.  7. Avodart 5 mg daily.  8. Coumadin 3 mg daily.   ALLERGIES:  No known drug allergies.   REVIEW OF SYSTEMS:  An extensive review of systems was done and all  systems are negative except for the positives mentioned in the history  of present illness.   PHYSICAL EXAMINATION:  VITALS:  On admission, his pulse was 86, blood  pressure 147/88, respiratory rate of 16, temperature 98, O2 sats of 96%  on room air.  GENERAL:  The patient is conscious, alert, oriented to time, place, and  person, in no significant distress at the time of this interview.  HEENT:  No scleral icterus.  No pallor.  Ears negative.  Poor dental  hygiene.  NECK:  Supple.  No lymphadenopathy.  No JVD.  No carotid bruit.  CHEST:  Breath sounds heard bilaterally.  Good air entry.  Occasional  rhonchi.  CVS:  S1 and S2 plus regular.  Positive 2/6 systolic ejection murmur  heard best in the left sternal border.  ABDOMEN:  Soft, nontender, nondistended.  Bowel sounds are present.  EXTREMITIES:  No cyanosis or clubbing.  The patient's right lower  extremity has 1 to 2+ swelling with minimal erythema.  Positive Hoffmann  sign.  Peripheral pulses are present.  NEUROLOGIC:  Cranial nerves II through XII are grossly intact.  No focal  motor or sensory deficits noted on gross examination.   LABORATORY DATA:  His D-dimer was 4.49.  His PT was 13.4, INR 1.0.  Sodium 140, potassium 4.2, chloride 105, BUN 14, creatinine 1.4.  Hemoglobin 18.4, hematocrit 54, CO2 of 26.   Right lower extremity Doppler ultrasound revealed an extensive clot from  common femoral, profunda, popliteal, and posterior tibial veins.    IMPRESSION AND PLAN:  This is a case of 65 year old African American  male with known history of recurrent deep venous thromboses who presents  with right lower extremity swelling and was noted to have extensive deep  venous thrombosis in the right lower extremity.  1. Recurrent deep venous thrombosis, likely from his being off      Coumadin from December 24, 2008, to January 09, 2009.  Even though he      has restarted Coumadin, his INR continues to be subtherapeutic.      The patient has already received a dose of Lovenox.  We will      increase his dose of Coumadin to 5 mg daily, check PT/INR daily,      and continue him on Lovenox per pharmacy recommendations.  The      patient will be admitted for overnight observation given his      history of recurrent deep venous thromboses and the fact that he      was recently admitted for severe anemia necessitating blood      transfusions.  We will need to monitor his H and H closely on      anticoagulation.  2. Diabetes mellitus type 2.  We will continue the patient on      glyburide and put him on sliding-scale insulin coverage with      regular insulin sensitive scale.  Check hemoglobin A1c.  3. History of renal insufficiency, creatinine appears stable.  We will      continue to monitor.  4. Hypertension.  Continue current medications.  5. Coronary artery disease, asymptomatic.  We will continue current      medications including beta-blocker and aspirin.  6. Gastroesophageal reflux disease.  Continue Protonix.  7. Benign prostatic hypertrophy.  Continue Avodart.  The patient is      asymptomatic at this time.  8. Tobacco abuse.  The patient counseled about the need for smoking      cessation and we will put him on nicotine patch.  9. Anemia.  H and H is 18 and 54.4.  We will need to recheck it and to      monitor his H and H q.12 h. while he is on anticoagulation in the      hospital.  10.Code status, full code.      Oswald Hillock, MD  Electronically Signed     BA/MEDQ  D:  01/12/2009  T:  01/13/2009  Job:  [id number]   cc:   Della Goo, M.D.

## 2010-10-01 NOTE — H&P (Signed)
[name], [name]               ACCOUNT NO.:  [id number]   MEDICAL RECORD NO.:  [id number]           PATIENT TYPE:   LOCATION:                                 FACILITY:   PHYSICIAN:  Della Goo, M.D.      DATE OF BIRTH:   DATE OF ADMISSION:  05/07/2007  DATE OF DISCHARGE:                              HISTORY & PHYSICAL   PRIMARY CARE PHYSICIAN:  Della Goo, M.D.   CHIEF COMPLAINT:  Intractable back pain.   HISTORY OF PRESENT ILLNESS:  This is a 65 year old male who was seen in  the office and directly referred for admission secondary to intractable  back pain which has been occurring for the past week and a half.  The  patient reports being unable to sit or stand.  The pain is radiating  down the right lower extremity and increased in the right hip area.  The  patient was seen in the emergency department on December 18 secondary to  the intractable pain, had an MRI study performed which revealed epidural  lipomatosis and central stenosis along with questionable intrathecal  hemorrhage.  Based on these results the patient has been referred for  direct admission to the hospital and for further evaluation by  neurosurgery.  The MRI also reveals multilevel bilateral facet  overgrowth and ligamentum flavum redundancy.  The stenosis is greater  seen at the L4-5 where there is a mild disk bulge and increased AP  diameter of the thecal sac at 7 mm and this is stated as being  compatible with central stenosis.  The patient denies loss of bowel or  bladder function.  He denies having any fever or chills, chest pain,  shortness of breath.  He has not had relief with pain medication which  has been prescribed.   PAST MEDICAL HISTORY:  Significant for hypertension, coronary artery  disease, history of pulmonary emboli, status post IVC filter placement  in February of 2007.   MEDICATIONS:  His medications at this time include Toprol-XL 100 mg one  p.o. daily.   ALLERGIES:   No known drug allergies.   SOCIAL HISTORY:  The patient is married, 1 son.  The patient has a past  history of tobacco abuse.  Denies any alcohol usage.   FAMILY HISTORY:  Noncontributory.   PHYSICAL EXAMINATION:  GENERAL:  This is a thin, well-nourished, well-  developed 65 year old male in discomfort but no acute distress.  VITAL SIGNS:  Temperature 98.5, blood pressure 115/94, heart rate 82,  respirations 20, O2 saturation 100% on room air.  HEENT:  Normocephalic, atraumatic.  Pupils equal, round and reactive to  light.  Extraocular muscles are intact.  Funduscopic benign.  Oropharynx  is clear.  NECK:  Supple.  Full range of motion.  No thyromegaly, adenopathy or  jugular venous distention.  CARDIOVASCULAR:  Regular rate and rhythm.  No murmurs, gallops, rubs.  LUNGS:  Clear to auscultation bilaterally.  ABDOMEN:  Positive bowel sounds.  Soft, nontender, nondistended.  EXTREMITIES:  With 2+ edema of the right lower extremity to the  pretibial area.  NEUROLOGICAL:  The patient is alert and oriented x3.  Agitated.  Speech  is clear.  Cranial nerves are intact.  There is pain with extension and  movement of the right lower extremity.  The patient has generalized  weakness.  However, is able to move all 4 of his extremities.  He is  unable to walk without assistance at this time secondary to increased  pain.   ASSESSMENT:  A 65 year old male being admitted with:  1. Intractable low back pain.  2. Hypertension.  3. Coronary artery disease.  4. Right lower extremity sciatica.   PLAN:  The patient will be admitted for further evaluation and treatment  of his intractable low back pain.  A neurosurgery consultation will be  requested to review his MRI studies performed on December 18.  This  review will be for options for further therapy and treatment of his  pain.  The patient will continue on his regular medications at this  time.  DVT prophylaxis has been ordered.  However, he  will be monitored  for complications.  The patient will be placed on IV fluids and pain  medication will be ordered to help control his pain.      Della Goo, M.D.  Electronically Signed     HJ/MEDQ  D:  05/07/2007  T:  05/07/2007  Job:  [id number]   cc:   Della Goo, M.D.

## 2010-10-02 NOTE — Discharge Summary (Signed)
NAMEDRAYCE, TAWIL               ACCOUNT NO.:  [id number]  MEDICAL RECORD NO.:  [id number]           PATIENT TYPE:  I  LOCATION:  4741                         FACILITY:  [facility]  PHYSICIAN:  Hartley Barefoot, MD    DATE OF BIRTH:  [birth year]  DATE OF ADMISSION:  09/28/2010 DATE OF DISCHARGE:  10/01/2010                              DISCHARGE SUMMARY   DISCHARGE DIAGNOSES: 1. Chest pain, probably demand ischemia secondary to hyperosmolar     nonketotic (coma). 2. Hyperosmolar nonketotic state. 3. Uncontrolled diabetes. 4. Acute on chronic systolic heart failure exacerbation. 5. Chronic renal failure insufficiency. 6. Prior history of deep vein thrombosis .  OTHER PAST MEDICAL HISTORY: 1. Chronic venous insufficiency secondary to deep vein thrombosis,     prior history of deep vein thrombosis status post IVC filter in     2005, on Coumadin. 2. 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 3. Uncontrolled diabetes type 2. 4. Hypertension. 5. Hyperlipidemia. 6. Pseudohyponatremia.  DISCHARGE MEDICATIONS: 1. Lovenox 80 mg subcu b.i.d. 2. Glipizide 5 mg p.o. daily before breakfast. 3. Lantus 20 units subcu daily. 4. Warfarin 7.5 p.o. daily. 5. Losartan 100 mg 1/2 a tablet daily. 6. Metoprolol 100 mg 1/2 a tablet p.o. b.i.d. 7. Avodart 0.5 mg 1 tablet by mouth daily. 8. Furosemide 40 mg p.o. daily. 9. Lipitor 20 mg p.o. daily. 10.Protonix 1 tablet by mouth daily.  MEDICATIONS STOPPED DURING THIS HOSPITALIZATION:  BiDil, amlodipine, hydralazine, warfarin 5 mg, Actos.  DISPOSITION AND FOLLOWUP:  Mr. Breighner will need to follow up with his primary care physician, Dr. Leilani Able.  He will need also to follow up with Dr. Shirlee Latch.  He will need to have diabetes followup.  Also, blood pressure will need to be followed up.  Consider restart BiDil if blood pressure tolerated.  He will need to follow up with the [facility]. He will need to have an INR check.  Now  that he is on Lovenox for his history of recurrent DVT and subtherapeutic INR.  STUDIES PERFORMED:  Chest x-ray showed cardiomegaly with vascular congestion.  BRIEF HISTORY OF PRESENT ILLNESS:  This is a 65 year old with past medical history of systolic and diastolic heart failure by 2D echo, ejection fraction 30%, history of DVT multiple times, ongoing tobacco use, came to the emergency department, complaining of chest pain.  He had a fight with his wife.  When he went to lay down, he said to have chest pain.  He relates sweating.  He described the pain as retrosternal, questioning intensity 10/10, accompanied by nausea.  HOSPITAL COURSE: 1. Hyperosmolar hyperglycemic nonketotic state.  The patient presented     with a blood sugar in the 700.  His anion gap was 13 but his bicarb     was at 23. he was started on the Glucommander. His blood sugar improved     and decreased to the 200 range.  His hemoglobin A1c was at 19.      He is going to be discharged on Lantus 20 units subcu daily.       Pioglitazone was stopped, concerned for heart failure  exacerbation.       He is going to be restarted on low- dose glipizide.       He will need to follow up with his primary care physician.       He relates adverse reaction to metformin, he got diarrhea.     I would avoid also metformin with his history of renal     failure.  He might benefit sliding scale insulin, but we may need     to work with him because he is afraid of needle.  On the day of     discharge, the patient's blood sugar has been in the 200 range. 2. Chest pain, possible to maybe some mild heart failure exacerbation     or in the setting of HONK.  His symptoms resolved after we     controlled his diabetes and restarted him on his heart failure     medications.  Cardiology was consulted.  They did not think that     this was  cardiac in origin.  He will need to follow up with his     Cardiology, Dr. Shirlee Latch.  Troponin x3 negative.   He has some T-wave     inversions, thought to be old. 3. Chronic renal insufficiency.  The patient's creatinine     peaked to 1.6.  He had a prior creatinine of 1.3 and 1.5 per     records. after decreasing dose of Cozaar, his creatinine decreased to 1.3,     I will avoid to use metformin on this patient . 4. Acute on chronic systolic heart failure exacerbation.      he was started on Lasix. BiDil was stopped due to soft systolic blood pressure.      He will need to follow up with his cardiologist. Over the course of     hospitalization, the patient became euvolemic.  On the day of     discharge, he denies shortness of breath or chest pain. 5. History of recurrent DVT.  His INR was not therapeutic.  He was     started on Lovenox and Coumadin.  He will be discharged on 3 more     days of Lovenox.  He will need to follow with the [facility]. 6. Diabetes, uncontrolled.  Hemoglobin A1c at 19.  He will need to     follow up with his primary care physician for further adjustment of     insulin.  Consider sliding scale insulin. 7. Pseudohyponatremia secondary to hyperglycemia.  This has resolved.  On the day of discharge, the patient was in improved condition.  Sodium 134, potassium 3.7, chloride 107, bicarb 24, BUN 12, creatinine 1.3. Hemoglobin 12.6, hematocrit 38, platelets 151, white blood cell 4.4.     Hartley Barefoot, MD    BR/MEDQ  D:  10/01/2010  T:  10/01/2010  Job:  [id number]  cc:   Karie Chimera, MD  Electronically Signed by Hartley Barefoot MD on 10/02/2010 02:05:19 PM

## 2010-10-03 ENCOUNTER — Ambulatory Visit (INDEPENDENT_AMBULATORY_CARE_PROVIDER_SITE_OTHER): Payer: Medicare Other | Admitting: *Deleted

## 2010-10-03 DIAGNOSIS — I509 Heart failure, unspecified: Secondary | ICD-10-CM

## 2010-10-03 DIAGNOSIS — I82409 Acute embolism and thrombosis of unspecified deep veins of unspecified lower extremity: Secondary | ICD-10-CM

## 2010-10-04 NOTE — H&P (Signed)
[name], [name]               ACCOUNT NO.:  [id number]   MEDICAL RECORD NO.:  [id number]          PATIENT TYPE:  INP   LOCATION:  1824                         FACILITY:  [facility]   PHYSICIAN:  Hettie Holstein, D.O.    DATE OF BIRTH:  1945/12/15   DATE OF ADMISSION:  10/26/2005  DATE OF DISCHARGE:                                HISTORY & PHYSICAL   PRIMARY CARE PHYSICIAN:  Billee Cashing, MD.   Please note this is only a brief admit note as the patient was just  hospitalized and released on Oct 09, 2005.   CHIEF COMPLAINT:  Lightheadedness and dizziness and continued pleuritic  chest pain and dark-colored stools.   HISTORY OF PRESENTING ILLNESS:  [name] is a pleasant 65 year old African-  American male with a history of recently diagnosed pulmonary embolus via VQ  scan on Oct 06, 2005, who had been undergoing close followup in the  outpatient setting through [facility]; however, he was unable to  coordinate Coumadin followup with his primary care physician, Dr. Ronne Binning,  after attempting to arrange this by phone.  He attempted to contact the  discharging service and was unsuccessful this past Friday; therefore, he has  had no Coumadin followup since his discharge on Oct 09, 2005.  He was  alerted on Saturday that his INR was elevated by Dr. Dimas Millin office and  he was told to hold his Coumadin; that was this just yesterday.  In any  event, he presents to the emergency department today with an INR of 6.6 and  a hemoglobin of 6.7.  His last INR at [facility] on Oct 09, 2005, was 1.7.  His last hemoglobin was 15.0.  In any event, [name] has been complaining  of a continued chest discomfort similar to it was when he initially had his  pulmonary embolus.  In any event, he did not undergo CT scanning scheduled  at that time due to elevated creatinine, and today his creatinine is 1.5.  He was given FFP as well as Vitamin K by the EDP, and currently he is  tachycardic  though hemodynamically stable.   In the emergency department, I have contacted the Gastroenterology Service  on call with [place].  [name] has seen a gastroenterologist in the  distant past, Dr. Trina Ao, who had followed him for a GI bleed at that time.  He has not had any problems since then.  He has reported dark-colored stools  for several days now.   PAST MEDICAL HISTORY:  For full history, please review the most recent  Hospital Course and Discharge Summary in H&P just a couple of weeks ago.  However, briefly, [name] has:  1.  History of a pulmonary embolus diagnosed by VQ scan revealing a high      probability with a large defect in the left lung on Oct 06, 2005.  2.  History of chronic renal insufficiency.  Today, his creatinine is 1.5,      and it appears that his baseline range is around 1.6 to 2 range.  3.  Coronary artery disease  and ischemic cardiomyopathy with an ejection      fraction of 50%.  Most recent cardiac catheterization in February 2007      revealed noncritical disease performed by Dr. Diona Browner.  4.  History of hypertension.  5.  History of tobacco abuse.  6.  Pulmonary artery hypertension with systolic pressure of 50 mm.  7.  History of peptic ulcer disease with history of bleeding in the distant      past.  8.  Status post appendectomy and a cholecystectomy.   MEDICATIONS PROVIDED:  The patient brought a handwritten list:  1.  Flomax - no dose was provided.  2.  Lisinopril 20 mg daily.  3.  Wellbutrin 150 mg daily.  4.  Tiazac 120 mg daily.  5.  Toprol-XL 25 mg daily.  6.  Coumadin - no dose was provided.  7.  Keflex -  he had discontinued.   ALLERGIES:  There are reports of ACE INHIBITOR INTOLERANCE in the record;  however, he is currently taking Lisinopril without difficulty.   PAST FAMILY AND SOCIAL HISTORY:  Please refer to most recent H&P.   REVIEW OF SYSTEMS:  He had been in his usual of health, though continued to  have chest discomfort  pleuritic in nature with no calf pain or tenderness.  He has had some left-sided pleuritic chest discomfort, dark-colored stools,  and rectal discomfort with bowel movements and tenesmus.   PHYSICAL EXAMINATION:  VITAL SIGNS:  He was slightly tachycardic, heart rate  in the 120s.  Blood pressure on the low side systolic 110.  He is afebrile.  Respirations are nonlabored.  Oxygen saturation was normal.  GENERAL:  The patient is alert and in no acute distress.  HEENT:  Head is normocephalic, atraumatic.  Extraocular muscles are intact,  and he revealed conjunctival pallor.  NECK:  Supple, nontender, no palpable mass.  CARDIOVASCULAR:  Normal S1 and S2.  LUNGS:  Clear.  ABDOMEN:  Slightly distended, though no rebound or guarding.  His stools  were hemoccult-positive as tested by the EDP in the department.  EXTREMITIES:  The lower extremities revealed no edema.  NEUROLOGIC:  No focal neurologic deficits.   LABORATORY DATA:  INR was 6.6, lipase 55, point-of-care markers at 1020 were  negative.  As noted above, hemoccult-positive stools.  Sodium 137, potassium  5.0, BUN 39, creatinine 1.5, glucose 195.  AST of __________, ALT 19,  albumin 2.7, WBC 15.7, hemoglobin 6.7, platelet count 261, MCV of 89.  EKG  revealed a sinus tach.   ASSESSMENT:  1.  Acute gastrointestinal bleed.  May be upper in light of the patient's      history and a mildly elevated BUN.  I have contacted Dr. Jeani Hawking      for further evaluation and management and perhaps endoscopic evaluation.      He is being requested to large-bore intravenous access and generous      intravenous fluids as well a blood transfusion.  2.  Coagulopathy secondary to elevated INR.  He has had no followup of his      coag's since discharge.  3.  Left lung pulmonary emboli with negative lower extremity Doppler's.  4.  Coronary artery disease with a noncritical coronary disease per last     cardiac catheterization and ejection fraction of  50%.  5.  Pulmonary artery hypertension.  6.  Exophytic bladder lesion, status post cystoscopic evaluation by Dr.      Retta Diones this past Tuesday.  I  do not have the results of these      findings; however, it was conveyed to the patient that it was felt that      these were not cancerous, though we will need these from Dr. Retta Diones.  7.  Chronic renal insufficiency.  His creatinine appears to be at baseline.  8.  History of hypertension.  9.  History of tobacco abuse.  10. A low protein at 2.7.  11. Leukocytosis.   PLAN:  At this time, [name] will be admitted to the Step-Down ICU, and  he will be fluid resuscitated and transfused appropriately.  We will attempt  to initiate heparin and transfuse as needed due to his underlying pulmonary  emboli.  Per the patient history, he states that he had lower extremity  Doppler's that do not reveal clots and I do not know if an IVC filter would  be a reasonable measure at this time.  In any event, we will follow him  closely in the Step-Down ICU with q.6h H&Hs while he is on heparin, and we  will have Gastroenterology follow him closely.      Hettie Holstein, D.O.  Electronically Signed     ESS/MEDQ  D:  10/26/2005  T:  10/26/2005  Job:  [id number]   cc:   Lorelle Formosa, M.D.  Fax: [phone]

## 2010-10-04 NOTE — Assessment & Plan Note (Signed)
[facility]                           GASTROENTEROLOGY OFFICE NOTE   NAME:[name], [name]                      MRN:          [id number]  DATE:                                      DOB:          January 19, 1946    PRIMARY CARE PHYSICIAN:  [name], M.D.   GI PROBLEM LIST:  GI bleed in June 2007, presented with melena, syncope.  Hemoglobin was 6.7.  INR was 6.6.  Recently coumadinized for pulmonary  embolism.  EGD October 27, 2005 essentially normal except for hiatal hernia.  Colonoscopy October 28, 2005 with small hyperplastic polyp, otherwise normal.  No continued bleeding.  It was thought his bleeding was from small AVM,  either small bowel or colon.  IVC filter was placed October 28, 2005.  Coumadin  and heparin stopped.  Bleeding has not returned.   INTERVAL HISTORY:  I last saw [name] when he was hospitalized in early  June.  Since then he is off Coumadin and has had no repeat signs of GI  bleeding.  No melena, no hematochezia.  He feels quite well.  He has  arranged for himself to be seen in the [place] facility to establish primary  care.   CURRENT MEDICINES:  Aspirin, Lopressor, Avodart, Flomax, Tiazac, Wellbutrin,  Toprol, Maalox and Mylanta p.r.n. for GERD symptoms that he takes on nearly  a daily basis.   PHYSICAL EXAMINATION:  VITAL SIGNS:  Blood pressure 114/64, pulse 60.  CONSTITUTIONAL:  Generally well appearing.  NEUROLOGIC:  Alert and oriented times three.  LUNGS:  Clear to auscultation bilaterally.  HEART:  Regular rate and rhythm.  ABDOMEN:  Soft, nontender, nondistended, normal bowel sounds.   ASSESSMENT AND PLAN:  A 65 year old man with recent gastrointestinal bleed.   I see no reason for any further GI workup given his bleeding has resolved  off of Coumadin and that he has undergone full colonoscopy and upper  endoscopy.  I have given him records that he will take to the [place] with him.  My recommendations are that he has a  screening colonoscopy again in about  ten years which is the colorectal cancer screening guideline for routine-  risk patients like himself.  He has GERD symptoms on nearly a daily basis  and although there was no esophagitis seen during his endoscopy, he would  probably be better off symptomatically if he  takes a proton pump inhibitor on a daily basis 20 or 30 minutes shortly  before breakfast.  We have given him samples of Prilosec, and he will  address this also at the [place].                                   Rachael Fee, MD   DPJ/MedQ  DD:  12/02/2005  DT:  12/02/2005  Job #:  [id number]   cc:   Timothy Formosa, MD

## 2010-10-04 NOTE — Discharge Summary (Signed)
[name], [name]               ACCOUNT NO.:  [id number]   MEDICAL RECORD NO.:  [id number]          PATIENT TYPE:  INP   LOCATION:  2630                         FACILITY:  [facility]   PHYSICIAN:  Isidor Holts, M.D.  DATE OF BIRTH:  [birth year]   DATE OF ADMISSION:  06/23/2005  DATE OF DISCHARGE:  06/25/2005                                 DISCHARGE SUMMARY   PRIMARY MEDICAL DOCTOR:  Dr. Katy Fitch.   DISCHARGE DIAGNOSES:  1.  Chest pain.  Myocardial infarction ruled out.  Status post cardiac      catheterization June 24, 2005.  This showed nonobstructive coronary      artery disease.  2.  Pulmonary hypertension.  3.  Dyslipidemia.  4.  Hypertension.  5.  Smoker.   DISCHARGE MEDICATIONS:  1.  Aspirin 325 mg p.o. daily.  2.  Lopressor 50 mg p.o. b.i.d.  3.  Zocor 40 mg p.o. at bedtime.  4.  Nicoderm CQ (14 mg/24 h.) 1 patch to skin daily.   PROCEDURES:  1.  Chest x-ray dated June 23, 2005.  This showed no active disease.  2.  Chest CT angiogram dated June 25, 2005.  This was a negative CT scan      of the chest.  No evidence of pulmonary embolism.  3.  Cardiac catheterization done June 24, 2005 by Dr. Nona Dell.      This showed the following findings:  Mild coronary atherosclerosis      within the left coronary tree, with approximately 40-50% stenosis noted      in the relatively small codominant right coronary artery.  Left      ventricular ejection fraction of approximately 50%, with global      hypokinesis.  No significant mitral regurgitation.  Left ventricular end-      diastolic pressure of 22 mmHg.  Pulmonary hypertension, with a pulmonary      artery systolic pressure of 50, pulmonary capillary wedge pressure mean      of 18, right atrial pressure mean of 16, cardiac index of 2.4.  4.  2D echocardiogram done June 24, 2005.  Results not yet available at      the time of this dictation.   CONSULTATIONS:  1.  Dr. Lewayne Bunting,  cardiologist.  2.  Dr. Nona Dell, cardiologist.   ADMISSION HISTORY:  As in H&P note of June 23, 2005 dictated by Dr.  Jetty Duhamel.  However, in brief, this is a 65 year old male, with known  history of dyslipidemia, hypertension, and tobacco use, who presents  following recurrent episodes of chest pain on the day of presentation,  associated with one episode of vomiting.  Given his risk factors for  coronary artery disease, he was admitted for further evaluation,  investigation, and management.   CLINICAL COURSE:  1.  Chest pain.  The patient presents with recurrent episodes of atypical      sounding chest pain.  This was described as a sharp, stabbing left-sided      chest pain moving in a vertical fashion from the top of the left chest  to the mid-left chest, associated with numbness in the left arm.  He has      multiple risk factors for coronary artery disease, including smoking      history, male gender, age, and a background history of dyslipidemia and      hypertension.  A 12-lead EKG showed T wave inversion in standard leads      I, II, and III, as well as AVF.  Serial cardiac enzymes, however, were      unelevated.  Cardiology consultation was called, which was kindly      provided by Dr. Lewayne Bunting, who recommended a cardiac catheterization.      This was performed on June 24, 2005 by Dr. Nona Dell and      essentially showed nonobstructive coronary artery disease.  For details,      refer to procedure list above.  Medical management was recommended,      particularly geared toward risk factor modification as well as aspirin      and beta blocker treatment.  Because the patient did have evidence of      pulmonary hypertension, it was felt that a chest CT angiogram was      indicated to rule out the possibility of pulmonary embolism.  This was      performed on June 25, 2005 and was negative.   1.  Hypertension.  This was adequately controlled  with beta blockers during      the course of the patient's hospital stay.   1.  Dyslipidemia.  The patient was already on statin treatment prior to      admission.  His lipid profile showed the following findings:  Total      cholesterol 143, triglycerides 75, HDL 33, LDL 95, i.e., reasonable      lipid profile.  We expect his PMD to follow the patient's lipid profile      and liver function tests and adjust medications appropriately as      indicated.   1.  Smoking history.  The patient was managed with counseling and Nicoderm      CQ patch.  He will be discharged on a month's supply of Nicoderm CQ      patch and is expected to follow up with his PMD.   DISPOSITION:  The patient remained asymptomatic throughout the course of his  hospital stay.  He was discharged in satisfactory condition on June 25, 2005.  He is expected to return to work on June 30, 2005.   DIET:  Healthy heart.   ACTIVITY:  As tolerated.  He has been instructed not to drive for 2 days.   WOUND CARE:  Not applicable.   PAIN MANAGEMENT:  Not applicable.   FOLLOW-UP INSTRUCTIONS:  The patient has been instructed to follow up with  his PMD, Dr. Katy Fitch, in 1-2 weeks' time.  He is to call to make  an appointment.  He is also to follow up with Dr. Lewayne Bunting at [facility] in due course.  He is to call for an appointment.  Appropriate  telephone number has been supplied.  All of this has been communicated to  the patient, who has verbalized understanding.      Isidor Holts, M.D.  Electronically Signed     CO/MEDQ  D:  06/25/2005  T:  06/25/2005  Job:  [id number]   cc:   Katy Fitch, M.D.   Learta Codding, M.D. [facility]  [address]  [address]  [address]  [place]  [place] [address]   Jonelle Sidle, M.D. [facility]  [address]  [place]  [place] [address]

## 2010-10-04 NOTE — Consult Note (Signed)
NAMEMICAEL, BARB               ACCOUNT NO.:  [id number]   MEDICAL RECORD NO.:  [id number]          PATIENT TYPE:  INP   LOCATION:  3731                         FACILITY:  [facility]   PHYSICIAN:  Bertram Millard. Dahlstedt, M.D.DATE OF BIRTH:  03-Feb-1946   DATE OF CONSULTATION:  10/08/2005  DATE OF DISCHARGE:                                   CONSULTATION   REASON FOR CONSULTATION:  Bladder lesion on ultrasound.   BRIEF HISTORY:  [name] is a 65 year old admitted for management of  pulmonary embolus. He became symptomatic on the 20th of this month. He had  chest pain and shortness of breath and near syncope. He was diagnosed with a  left PE. He is on Lovenox and Coumadin at the present time. As the patient  has a creatinine of 1.6, and with the patient being on ACE inhibitor, renal  ultrasound was performed. This revealed normal kidneys but an apparent 3-4  cm exophytic mass in his bladder.   The patient denies any recent hematuria. He has slow stream, frequency,  urgency, and occasional urgency incontinence. He has never been told that he  had prostatitis or bladder infection or significant enlargement of his  prostate. He denies any dysuria. He denies any fever or chills.   CURRENT MEDICATIONS:  1.  Toprol XL 25 a day.  2.  Cardizem CD 120 mg a day.  3.  Nicotine patch.  4.  Wellbutrin 150 mg a day.  5.  Docusate.  6.  Protonix 40 mg a day.  7.  Aspirin 81 mg a day.  8.  Coumadin.  9.  Lovenox.  10. Lasix 20 mg.  11. Simvastatin 40 mg.  12. Prinivil 20 mg.   He has had a possible reaction to Lipitor.   PAST MEDICAL HISTORY:  Significant for appendectomy, cholecystectomy,  history of hemorrhoids, peptic ulcer disease, hypertension,  hypercholesterolemia. He also has coronary artery disease.   The patient is married. He is a cigarette smoker. He has a history of  alcohol abuse but not recently.   FAMILY HISTORY:  Significant for MI, colon cancer, alcoholic liver disease  and stroke.   PHYSICAL EXAMINATION:  Revealed a normal-appearing abdomen without  tenderness. External genitalia normal. Rectal was deferred.   Urinalysis has not been obtained yet.   IMPRESSION:  1.  Lower urinary tract symptoms of urgency, frequency, and urgency      incontinence.  2.  Possible benign prostatic enlargement with symptoms.  3.  Bladder lesion - could be enlarged median lobe of the prostate or a      bladder tumor.   PLAN:  1.  I will review the films and talk with the patient about my      understanding/interpretation of these.  2.  If it looks like his bladder mass is just benign prostatic hypertrophy,      no further evaluation necessary at the present time.  3.  It if looks like he has an exophytic lesion will anticipate cystoscopy.  4.  Will check urinalysis.  5.  At the present time, this patient is not  symptomatic with blood, will      defer any further diagnostic studies until he is through this critical      period.      Bertram Millard. Dahlstedt, M.D.  Electronically Signed     SMD/MEDQ  D:  10/08/2005  T:  10/09/2005  Job:  [id number]

## 2010-10-04 NOTE — Consult Note (Signed)
NAMEENNIS, HEAVNER               ACCOUNT NO.:  [id number]   MEDICAL RECORD NO.:  [id number]          PATIENT TYPE:  INP   LOCATION:  3305                         FACILITY:  [facility]   PHYSICIAN:  Jordan Hawks. Elnoria Howard, MD    DATE OF BIRTH:  1945-06-07   DATE OF CONSULTATION:  10/26/2005  DATE OF DISCHARGE:                                   CONSULTATION   PRIMARY CARE PHYSICIAN:  Lorelle Formosa, M.D.   REASON FOR CONSULTATION:  Weakness, dizziness and melena.   HISTORY OF PRESENT ILLNESS:  This is a 65 year old gentleman with a past  medical history of coronary artery disease, hyperlipidemia, chronic renal  failure and hypertension, pulmonary embolus in March 2007 and  gastroesophageal reflux disease, who presented to the emergency room with  complaints of a near syncopal episode.  Several days prior to admission the  patient states that he had intermittent black stools that were either soft  or formed in nature.  Subsequently during one episode when he was in the  restroom the patient was unable to stand and felt very dizzy and weak and  fatigued.  He had a near syncopal episode and subsequently was brought in to  the emergency room for further evaluation.  The patient denies any abdominal  pain or any recent NSAID use, nausea, vomiting or diarrhea.  No fevers or  chills.  The patient states that he had a colonoscopy performed by Dr. Dortha Kern a number of years ago, as well as an EGD for complaints of  gastroesophageal reflux disease.   Upon arrival to the hospital the patient was noted to have a low hemoglobin  of approximately 6.7 and he was also found to have a supra-therapeutic INR  at 6.6.  It was felt that the patient required further evaluation with a GI  consultation.   PAST MEDICAL/SURGICAL HISTORY:  As stated above with the addition of a  cholecystectomy and appendectomy.  A bladder mass which is exophytic.   FAMILY HISTORY:  Significant for hypertension and colon  cancer.   SOCIAL HISTORY:  The patient recently quit smoking approximately four weeks  ago.  He was smoking 1-1/2 packs per day.  He also stopped drinking alcohol  in the late 1970's and early 1980's.   ALLERGIES:  ZOCOR AND ACE INHIBITORS.   MEDICATIONS:  1.  Flomax.  2.  Lisinopril 20 mg p.o. daily.  3.  Wellbutrin 150 mg p.o. daily.  4.  Tiazac 120 mg p.o. daily.  5.  Coumadin.  6.  Toprol XL 25 mg p.o. daily.   PHYSICAL EXAMINATION:  VITAL SIGNS:  Blood pressure 132/57, heart rate 105,  respirations 20.  He did have elevation in his heart rate up to 125 at one  point while in the emergency room.  GENERAL:  The patient is in no acute distress but appears to be fatigued and  weak.  HEENT:  Normocephalic and atraumatic.  Extraocular muscles intact.  Pupils  equal, round, reactive to light.  NECK:  Supple, no lymphadenopathy.  LUNGS:  Clear to auscultation bilaterally.  CARDIOVASCULAR:  A regular rate and rhythm.  He is tachycardic.  EXTREMITIES:  No clubbing, cyanosis or edema.  RECTAL:  Significant for melena.   LABORATORY DATA:  White blood cell count 15.7, hemoglobin 6.7, MCV 88.9,  platelet count 261.  PT 57.2, INR 6.6.  Sodium 137, potassium 5.2, chloride  106, CO2 of 25, glucose 195, BUN 39, creatinine 1.5.  Urinalysis is  negative.   IMPRESSION:  1.  Gastrointestinal bleed.  2.  History of a pulmonary embolus.  3.  Hypertension.  4.  History of depression.   RECOMMENDATIONS:  After evaluation of the patient it is apparent that he  does require an EGD to identify the source of his anemia at this time.  If  the EGD is negative, then a colonoscopy can be pursued.  The patient is  relatively unstable to undergo the procedures at this time, therefore he  should undergo a blood transfusion and stabilization of his vital signs  prior to any further evaluation.  The patient did also complain of having  some chest pain and the pain is similar to his prior pulmonary  embolus of  which the etiology is unknown at this time; however, in light of his GI  bleeding, a correction of the INR is required.   PLAN:  1.  To perform an EGD tomorrow with [facility].  2.  Transfuse as necessary.  3.  Follow his CBC.  4.  Correct the INR.      Jordan Hawks Elnoria Howard, MD  Electronically Signed     PDH/MEDQ  D:  10/26/2005  T:  10/27/2005  Job:  [id number]   cc:   Lorelle Formosa, M.D.  Fax: [phone]   Bertram Millard. Dahlstedt, M.D.  Fax: [phone]

## 2010-10-04 NOTE — Cardiovascular Report (Signed)
[name], [name]               ACCOUNT NO.:  [id number]   MEDICAL RECORD NO.:  [id number]          PATIENT TYPE:  INP   LOCATION:  2630                         FACILITY:  [facility]   PHYSICIAN:  Jonelle Sidle, M.D. LHCDATE OF BIRTH:  Jan 15, 1946   DATE OF PROCEDURE:  06/24/2005  DATE OF DISCHARGE:                              CARDIAC CATHETERIZATION   REQUESTING CARDIOLOGIST:  Dr. Lewayne Bunting   INDICATIONS:  [name] is a 65 year old male with available history  indicating hypertension, hyperlipidemia, and no prior history of  cardiovascular disease.  He is now admitted to the hospital having presented  following an episode of nausea, emesis, lightheadedness, and diaphoresis  also associated with transient nose bleed and atypical chest pain described  as a pin-like sensation.  He was noted to have an abnormal  electrocardiogram showing a Q-wave in lead 3 with inferior T-wave inversion  and also abnormal cardiac markers with increased CK of 423, CK-MB of 6.6,  but normal troponin I of 0.01.  He is now referred for right and left heart  catheterization to assess hemodynamics and coronary anatomy.  The risks and  benefits were explained to the patient and he had signed informed consent  prior to proceeding.   PROCEDURE PERFORMED:  1.  Left heart catheterization  2.  Right heart catheterization  3.  Selective coronary angiography.  4.  Left ventriculography.   ACCESS AND EQUIPMENT:  The area about the right femoral artery and vein was  anesthetized with 1% lidocaine.  A 6-French sheath was placed in the right  femoral artery via the modified Seldinger technique.  A 7-French sheath was  placed in the right femoral vein via the modified Seldinger technique.  Standard preformed 6-French JL4 and JR4 catheters were used for selective  coronary geography and an angled pigtail catheter was used for left heart  catheterization and left ventriculography.  All exchanges were made over  a  wire.  A standard 7-French balloon-tipped flow-directed catheter was used  for right heart catheterization without need of a wire.  The patient  tolerated procedure well without any complications.  Total contrast was 120  mL of Omnipaque.   HEMODYNAMICS:  Right atrial mean of 16.  Right ventricle 53/12.  Pulmonary  artery 50/23 with a mean of 34.  Pulmonary capillary wedge pressure mean of  18.  Left ventricle 135/22.  Aorta 134/80.  Cardiac output 5 by  thermodilution method.  Cardiac index 2.4 by the thermodilution method.  Arterial saturation is 92% on room air.  Pulmonary artery saturation is 60%  on room air.   ANGIOGRAPHIC FINDINGS:  1.  The left main coronary artery is free of significant flow-limiting      coronary sclerosis and gives rise to left anterior descending, a ramus      intermedius,  and circumflex coronary arteries.  2.  The left anterior descending is a medium to large caliber vessel that      extends to and wraps the apex.  There is one large proximal diagonal      branch noted.  In the mid  left anterior descending is a fairly smooth      20% stenosis.  3.  The ramus intermedius is medium in caliber and bifurcates.  There is no      significant flow-limiting stenosis noted.  4.  The circumflex coronary artery is a large codominant vessel.  There are      three obtuse marginal branches and a posterior descending branch.  There      is approximately 20% smooth stenosis in the mid circumflex coronary      artery.  5.  The right coronary artery is a relatively small vessel that is      codominant with the circumflex.  In the proximal portion of the vessel      was a 40-50% stenosis that does not appear to be hazy.  Flow was TIMI 3      within this vessel.  There is approximately 20% stenosis noted in the      mid to distal segment of vessel.   Left ventriculography was performed in the RAO projection and reveals an  ejection fraction of approximately 50% with  global hypokinesis and no  significant mitral regurgitation.   DIAGNOSES:  1.  Mild coronary atherosclerosis within the left coronary tree with      approximately 40-50% stenosis noted IN a relatively small codominant      right coronary artery.  2.  Left ventricular ejection fraction of approximately 50% with global      hypokinesis, no significant mitral regurgitation, and left ventricular      end-diastolic pressure of 22 mmHg.  3.  Pulmonary hypertension with a pulmonary artery systolic pressure of 50,      pulmonary capillary wedge pressure mean of 18, right atrial pressure      mean of 16, and cardiac index of 2.4.   I discussed the results with the patient and with Dr. Andee Lineman by phone.  Would anticipate medical therapy for atherosclerosis at this time.  Will  also plan to check a contrasted CT scan of the chest tomorrow assuming  creatinine remains stable to evaluate the lung parenchyma and also exclude  pulmonary embolus.  A 2-D echocardiogram will also be obtained to assess  both right and left ventricular function.  Will plan to place the patient  back on heparin six hours after his sheath pull until pulmonary embolus has  been excluded.  Further plans based on these findings.           ______________________________  Jonelle Sidle, M.D. [facility]     SGM/MEDQ  D:  06/24/2005  T:  06/24/2005  Job:  [id number]   cc:   Lorelle Formosa, M.D.  Fax: [phone]

## 2010-10-04 NOTE — H&P (Signed)
NAMESEVRIN, Timothy               ACCOUNT NO.:  [id number]   MEDICAL RECORD NO.:  [id number]          PATIENT TYPE:  INP   LOCATION:  2630                         FACILITY:  [facility]   PHYSICIAN:  [name], M.D. LHCDATE OF BIRTH:  March 08, 1946   DATE OF ADMISSION:  06/23/2005  DATE OF DISCHARGE:                                HISTORY & PHYSICAL   CONSULTING PHYSICIAN:  Dr. Andee [name]   CARDIOLOGIST:  New, being seen by Dr. Andee [name]   PRIMARY CARE PHYSICIAN:  Dr. Billee [name]   We were asked by [facility] physicians to consult on this 65 year old African-  American gentleman with no known history of coronary artery disease.  Mr.  [name] presents to [facility] via EMS with complaints of  chest discomfort with associated nausea, vomiting, diaphoresis, and  dizziness.  [name]' only history is hypercholesterolemia and  hypertension x5 years.  However, patient reports his blood pressure  fluctuates drastically.  It sounds like he probably has very labile blood  pressure.  He also has migraines from his blood pressure at times.  Mr.  [name] reports a history of mild chest tightness with exertion that is  relieved at rest over the last six months.  He first noticed it over the  summer when he would push mow he would experience chest tightness that  resolved upon stopping his activity.  He also complains of dyspnea on  exertion that has become more pronounced over the last six to eight months.  His chest pain today began while he was at work today.  He states he was at  work this morning at 7 a.m.  He works at the post office.  He was doing some  organizing and light exertion.  He states he became nauseated.  He stopped  what he was doing and went to the bathroom, vomited.  He said he became very  diaphoretic and the nausea continued.  He began to have a nose bleed after  he vomited.  He actually vomited on three different occasions.  He started  having chest pain, became  more diaphoretic.  The chest pain he described  this morning was a stabbing sensation beneath his left breast.  He stated  the discomfort was a 6-7 on a scale of 1-10.  He also felt his heart rate  racing.  EMS was notified.  O2 was applied.  Patient states his chest pain  began to ease off around 2.  He arrived at [facility].  Nitroglycerin and  Lopressor were given and his chest pain resolved completely.   ALLERGIES:  No known drug allergies.  No intolerance to contrast dye.   MEDICATIONS:  1.  Lipitor 20 mg daily.  2.  HCTZ 25 mg daily.  3.  Prilosec p.r.n.   PAST MEDICAL HISTORY:  1.  Hypercholesterolemia.  2.  Hypertension x5 years.  3.  Migraine.   PAST SURGICAL HISTORY:  1.  Cholecystectomy.  2.  Appendectomy.  3.  He denies any history of diabetes, thyroid problems, COPD.   SOCIAL HISTORY:  He lives in [place] with  his wife.  He works for the  post office.  He has smoked cigarettes for approximately 50 years at a pack  a day.  Denies any ETOH, drug, or herbal medication use.  He eats a regular  diet.   FAMILY HISTORY:  Mother deceased at age 96 secondary to a CVA.  Father  deceased in his 41s secondary to ETOH-induced cirrhosis.  He also had a  history of diabetes.  Siblings:  All his brothers and sisters have  hypertension.  Brother deceased in his 64s secondary to an acute MI.   REVIEW OF SYSTEMS:  Positive for blurred vision, chest pain, dyspnea on  exertion, tachy palpitations, presyncope, and dizziness.  GU:  Positive for  urgency.  GI:  Positive for nausea, vomiting, diarrhea, GERD symptoms.  Patient states he has some external bleeding from his hemorrhoids when he is  constipated.  All other symptoms negative per patient.   PHYSICAL EXAMINATION:  VITAL SIGNS:  Temperature 98.3, pulse 70,  respirations 20, blood pressure 155/107.  Patient is saturating 97% on 2 L.  GENERAL:  He is in no acute distress.  HEENT:  Pupils are equal, round, and reactive to  light.  Sclerae clear.  NECK:  Supple without lymphadenopathy.  Negative bruit or JVD.  CARDIOVASCULAR:  Heart rate regular rhythm.  S1 and S2 without murmurs,  rubs, or gallops.  Pulses are 2+ and equal without bruits.  LUNGS:  Clear to auscultation.  ABDOMEN:  Soft and nontender.  He has positive bowel sounds.  EXTREMITIES:  Without clubbing, cyanosis, edema.  NEUROLOGIC:  Alert and oriented x3.  Cranial nerves II-XII grossly intact.   Chest x-ray with no active or acute disease.  EKG at a rate of 73, sinus  rhythm with some LVH.   LABORATORIES:  Hemoglobin 17.3, hematocrit 51.  Sodium 140, potassium 4.2,  BUN 15, creatinine 1.3 with a glucose 109.  Cardiac enzymes:  Point of care:  Troponin less than 0.05 x2.  BNP less than 30.  PT 12.8 with INR 0.9.   Dr. Andee [name] in to examine and assess patient with chest pain with multiple  risk factors including hypertension, hyperlipidemia, and ongoing tobacco  abuse.  We will cycle cardiac enzymes.  Continue heparin and nitroglycerin  drip which has been initiated by the [facility] physician.  Continue beta  blocker, aspirin, Statin, ACE inhibitor as patient was previously on.  Will  plan on doing a right and left cardiac catheterization tomorrow for further  evaluation.      Dorian Pod, NP      [name], M.D. [facility]  Electronically Signed   MB/MEDQ  D:  06/23/2005  T:  06/24/2005  Job:  [id number]

## 2010-10-04 NOTE — H&P (Signed)
NAMEABRAN, GAVIGAN               ACCOUNT NO.:  [id number]   MEDICAL RECORD NO.:  [id number]          PATIENT TYPE:  EMS   LOCATION:  MAJO                         FACILITY:  [facility]   PHYSICIAN:  Jonna L. Robb Matar, M.D.DATE OF BIRTH:  03/10/1946   DATE OF ADMISSION:  10/05/2005  DATE OF DISCHARGE:                                HISTORY & PHYSICAL   PRIMARY CARE PHYSICIAN:  Dr. Lieutenant Diego.   CARDIOLOGIST:  Dr. Corinda Gubler.   CHIEF COMPLAINT:  Short of breath and dizzy.   HISTORY:  This 65 year old African American awoke at 5 a.m. 4 days ago with  a sharp left-sided chest pain, short of breath and dyspnea on exertion.  The  pain was in the left upper chest, worse with a deep breath.  He took an  antacid and it eased up some, but then it came back.  He sort of dealt with  it for a couple of days.  By Saturday, he noticed he was having episodes of  being orthostatic, no hemoptysis, no palpitations.  He contacted [facility], but they told him that did not think it was a heart attack, he  should contact his PCP.  He contacted his PCP, who was out of town, and  finally today he was getting much more short of breath when he exerted  himself and came to the hospital.   PAST MEDICAL HISTORY:  1.  Coronary artery disease.  He was here in February with elevated troponin      and inferior wall MI and they actually catheterized him and it was a      nonobstructive coronary artery disease, so he may have either passed a      clot or had a spasm.  He has had episodes of getting chest pains and      dizziness when he exerts himself since then.  2.  Hypercholesterolemia.  He had been sent home on Zocor, but it gave him      numbness and tingling and muscle cramps, so he was told to stop it by      his PCP and the symptoms cleared up.  3.  Pulmonary hypertension.  This was noted on his catheterization.  4.  Chronic renal failure.  His creatinine was 1.3 and his BNP 30 back in  February; it is now elevated.   OPERATION:  1.  Appendectomy.  2.  Cholecystectomy.   FAMILY HISTORY:  Hypertension and colon cancer.   SOCIAL HISTORY:  Non-drinker, no drugs.  He smokes 1-1/2 packs per day; he  had quit in February and March and then started back in April.  He is  married.   ALLERGIES:  As noted, he had a reaction to the ZOCOR; I am also going to  list ACE INHIBITORS, as that may be the cause of his chronic renal failure.   MEDICATIONS:  1.  Lisinopril 20 mg daily.  2.  Hydrochlorothiazide 25 mg daily.  3.  Aspirin 81 mg daily.  4.  He occasionally takes Advil or Aleve.   REVIEW OF SYSTEMS:  He has occasional  problems with headaches.  He has sores  that do not heal up on his legs, but every time he has had his sugars  checked, he has been told he was not diabetic.  He has a history of bleeding  ulcers, has dyspepsia and GERD symptoms.  He has occasionally gotten some  leg cramps which in retrospect, I wonder might be DVT.  He is lactose  intolerant.  He has had a couple of episodes of a lower GI bleeding, which  was sort of mild and stopped on its own.  He has had a colonoscopy done and  they did not find anything.  The rest of the 12-system review is negative.   PHYSICAL EXAMINATION:  VITAL SIGNS:  Temperature 98.0, pulse 108,  respirations 18, blood pressure 109/72 and 130/98, O2 SAT 97% on room air.  GENERAL:  He is a middle-aged [place] American male.  HEENT:  Conjunctivae and lids are normal.  Pupils are reactive.  Extraocular  movements are full.  He has normal hearing and mucosa.  NECK:  The neck shows no mass, thyromegaly or carotid bruits.  LUNGS:  Respiratory effort is slightly increased.  Lungs are clear to A&P  without wheezing, rales, rhonchi or dullness.  HEART:  Tachycardic.  Normal S1 and S2, without murmurs, rubs, or gallops.  No cyanosis, clubbing, edema or Homans sign.  ABDOMEN:  Nontender, slightly distended.  No hepatosplenomegaly or  hernia.  GU:  Normal external genitalia.  No adenopathy.  MUSCULOSKELETAL:  Muscle strength is 5/5 with full range of motion in all 4  extremities.  There are no arthritic changes.  SKIN:  No skin rashes, lesions or nodules.  NEUROLOGIC:  Cranial nerves are intact.  DTRs are 2+ in the knees.  Sensation is normal.  The patient is alert and oriented, normal memory,  judgment and affect.   LABORATORY WORK:  Hemoglobin 16.3.  D-dimer 8.65.  BNP 385.  BUN 21,  creatinine 2.1.   EKG shows tachycardia and an S1 Q3 T3 pattern.   PCO2 38, pH 7.45.  Cardiac enzymes were negative.   IMAGING STUDIES:  Interestingly, a CT of the chest in February was negative.   Chest x-ray now shows cardiomegaly, no active disease.   V/Q showed large defects in the left upper lobe and the lingula with a high  probability.   IMPRESSION:  1.  Pulmonary emboli.  The patient was started on heparin and I will switch      to Lovenox and start putting him on Coumadin.  I am also going to check      the patient for a factor Leiden, as heterozygous can be implicated in      pulmonary emboli.  It may have also been triggered by the fact that he      restarted smoking.  2.  Possible congestive heart failure.  We will recheck an echocardiogram.  3.  Chronic renal insufficiency.  I am going to discontinue his ACE      inhibitor and he needs to restart his hydrochlorothiazide at only 12.5      mg.  We will ultrasound his kidneys and try to get an idea if this is a      reaction to the ACE inhibitor or if there is something more going on.  4.  Smoker.  He said he has tried nicotine patches and they do not quite do      the job for him, so I am going to  put him back on a nicotine patch and      add on Wellbutrin.  5.  Depression.  He finds that when he is depressed about trying to get on      disability or not necessarily being able to go back to work yet, that he     gets depressed and that is a trigger for his smoking.   Wellbutrin might      be a good choice here because it will deal with both of these issues.  6.  Family history of colon cancer.  7.  Gastroesophageal reflux disease.  8.  History of lower gastrointestinal bleeding.  All 3 of those I am      concerned about in the sense that if these reactivate, he will have      issues when he is on Coumadin.  I am going to put him on a proton pump      inhibitor twice daily.  He says he gets lower gastrointestinal bleeding      issues when he tends to get constipated; it sounds like hemorrhoids or      diverticula, so I am going to keep him on a stool softener.  He probably      should get a gastrointestinal evaluation at some point with a family      history of colon cancer.  9.  Coronary artery disease.      Jonna L. Robb Matar, M.D.  Electronically Signed     JLB/MEDQ  D:  10/06/2005  T:  10/06/2005  Job:  [id number]   cc:   Lieutenant Diego MD

## 2010-10-04 NOTE — Consult Note (Signed)
NAMECLEOPHAS, YOAK               ACCOUNT NO.:  [id number]   MEDICAL RECORD NO.:  [id number]          PATIENT TYPE:  INP   LOCATION:  3731                         FACILITY:  [facility]   PHYSICIAN:  Tereso Newcomer, P.A.     DATE OF BIRTH:  [birth year]   DATE OF CONSULTATION:  10/07/2005  DATE OF DISCHARGE:                                   CONSULTATION   PRIMARY CARE PHYSICIAN:  Lorelle Formosa, M.D.   PRIMARY CARDIOLOGIST:  Learta Codding, M.D.   REFERRING PHYSICIAN:  Hillery Aldo, M.D.   REASON FOR CONSULTATION:  Chest pain.   HISTORY OF PRESENT ILLNESS:  Mr. Saye is a very pleasant 65 year old male  patient who has a history of nonobstructive coronary disease by  catheterization February 2007, who was admitted on Oct 05, 2005, with chest  pain, shortness of breath on exertion and near syncope.  He underwent VQ  scan which was positive for high probability of left pulmonary embolism.  He  is now on Lovenox and Coumadin.  He has continued to have left-sided chest  pain and we are asked to help with evaluation.  The patient describes the  pain as left-sided and it feels like a pressure.  It goes across  his left  chest from lateral to medial.  He has noted some radiation to his left leg.  He had some neck pain the other day but no true radiation to his jaw.  He  denies any nausea or diaphoresis associated with this.  He does note  shortness of breath.  He first started noticing symptoms this past Thursday.  He denies any exertional symptoms.  He had another episode of chest pain  today.  The patient tells me that he received two nitroglycerin without  relief but did pain medication in his IV that seemed to help.  His  troponin I this morning was mildly elevated at 0.07.   PAST MEDICAL HISTORY:  1.  Nonobstructive coronary disease by catheterization on June 24, 2005.      His LAD had a mid 20% lesion, circumflex with a mid 20% lesion,      codominant RCA with circumflex had  a proximal 40 to 50% lesion and mid      to distal lesion of 20%.  His left ventriculogram revealed an EF of 50%      with global hypokinesis and pulmonary hypertension with a systolic      pressure of 50 mmHg.  His wedge pressure was 18.  2.  Hypercholesterolemia.  3.  Hypertension.  4.  Peptic ulcer disease with a history of upper GI bleeding secondary to      peptic ulcer disease and alcohol use.  5.  History of hemorrhoids.  6.  He is status post appendectomy and cholecystectomy.   CURRENT MEDICATIONS:  1.  Lovenox.  2.  Coumadin.  3.  Aspirin 81 mg daily.  4.  Protonix 40 mg daily.  5.  Docusate.  6.  Wellbutrin 150 mg daily.  7.  Nicotine patch.  8.  Cardizem CD 120 mg a day.  9.  Toprol XL 25 mg daily.  10. Lasix 20 mg daily.  11. Simvastatin 40 mg nightly.  12. Prinivil 20 mg daily.   ALLERGIES:  He has a questionable reaction to LIPITOR in the past and also  has a questionable reaction to ACE INHIBITORS but is currently on an ACE  inhibitor.   SOCIAL HISTORY:  The patient lives with his wife.  He smokes a half a pack  to a pack per day for the last 44 years.  He has a previous history of  alcohol abuse but denies any current abuse.  Denies any drug abuse.   FAMILY HISTORY:  Significant for a brother who died in his 70s of a  myocardial infarction.  He had two brothers with colon cancer.  His father  died in his 43s of alcoholic cirrhosis.  His mother died at 34 with a  stroke.   REVIEW OF SYSTEMS:  Please see HPI.  Denies any fever, chills, cough,  headaches, blurry vision, dysphagia, odynophagia, melena, hematemesis.  He  has had some bright red blood per rectum secondary to hemorrhoids.  Denies  any myalgias, arthralgias, weakness, numbness.  Denies any symptoms  consistent with amaurosis fugax or TIAs.  Rest of the review of systems are  negative.   PHYSICAL EXAMINATION:  GENERAL APPEARANCE:  He is well-developed, well-  nourished, no acute distress.  VITAL  SIGNS:  Blood pressure 147/102, pulse 101, respirations 12,  temperature 98.8, oxygen saturation 95% on room air.  HEENT:  Normocephalic and atraumatic.  Eyes:  PERRLA, EOMI, sclerae are  clear.  Positive exophthalmus.  NECK:  JVP at 4 to 5 cm.  Positive HJR.  No lymphadenopathy.  LUNGS:  Decreased breath sounds in the left base.  Positive crackles,  otherwise clear.  CARDIOVASCULAR:  Normal S1 and S2.  Regular rate and rhythm without murmurs.  SKIN:  Without rashes.  VASCULAR:  Dorsalis pedis and posterior tibialis pulses 2+ bilaterally, no  carotid bruits bilaterally.  ABDOMEN:  Soft, nontender with normal active bowel sounds.  No rebound, no  guarding.  No hepatomegaly.  EXTREMITIES:  Without clubbing, cyanosis, or edema.  MUSCULOSKELETAL:  Without joint deformity.  NEUROLOGIC:  Alert and oriented x3.  Cranial nerves II-XII grossly intact.  Strength is 5/5 in all extremity and axial groups.   This admission, echocardiogram  reveals an EF of 50%, mild LVH, diastolic  dysfunction, moderate RV dysfunction and dilated right atrium.  Renal  ultrasound shows no hydronephrosis, 4 cm exophytic mass in the bladder  lumen, VQ high probability of pulmonary embolus.   Chest x-ray no acute disease.   EKG shows a sinus tachycardia, heart rate 104, T-wave inversion and Q-waves  in lead III, T-wave inversions in V1-3.   Hemoglobin 15.2, white count 9200, platelet count 154,000.  Sodium 141,  potassium 3.8, BUN 13, creatinine 1.6, glucose 200.  Troponin I 0.07, CK-MB  negative.  BNP 384.  Total cholesterol 166, triglycerides 98, HDL 28, LDL  118.   IMPRESSION:  1.  Left lung pulmonary embolus.  2.  Chest pain with mildly elevated troponins - secondary to #1.  3.  Nonobstructive coronary disease by catheterization February 2007.  4.  Diastolic dysfunction.  5.  Mild volume overload secondary to #1 and #4.  6.  Renal insufficiency.  7.  Hypertension. 8.  Treated hypercholesterolemia.  9.   History of peptic ulcer disease secondary to alcohol abuse.   PLAN:  The patient was also interviewed and  examined by Dr. Eden Emms.  He has  symptoms and findings fairly classic for pulmonary embolism.  His  echocardiogram  shows primarily right-sided dysfunction.  He has no evidence  of DVT on exam and his ultrasound has been negative for DVT.  We do not  think he needs further cardiac work-up at this point in time.  We agree with  anticoagulation with Lovenox and Coumadin.  The patient may benefit from the  addition of Toradol and Percocet for pain.  We have encouraged him to quit  smoking.  We will follow the patient throughout his admission.      Tereso Newcomer, P.A.     SW/MEDQ  D:  10/07/2005  T:  10/08/2005  Job:  [id number]   cc:   Lorelle Formosa, M.D.  Fax: [phone]   Hillery Aldo, M.D.

## 2010-10-04 NOTE — Discharge Summary (Signed)
[name], [name]               ACCOUNT NO.:  [id number]   MEDICAL RECORD NO.:  [id number]          PATIENT TYPE:  INP   LOCATION:  3735                         FACILITY:  [facility]   PHYSICIAN:  Isidor Holts, M.D.  DATE OF BIRTH:  1945/05/23   DATE OF ADMISSION:  10/26/2005  DATE OF DISCHARGE:  10/30/2005                                 DISCHARGE SUMMARY   PRIMARY CARE PHYSICIAN:  Dr. Billee Cashing.   DISCHARGE DIAGNOSES:  1.  Recent pulmonary embolism, on anticoagulation since May 2007.  2.  Gastrointestinal bleed, unclear source.  3.  Status post inferior vena cava filter/anticoagulation discontinued,      secondary to #1 and #2 above.  4.  Acute blood loss anemia, status post transfusion 5 units packed red      blood cells.  5.  Coagulopathy, secondary to over-anticoagulation with Coumadin, resolved      with fresh frozen plasma and vitamin K.  6.  History of coronary artery disease/ischemic cardiomyopathy, ejection      fraction 50%.  7.  History of hypertension.  8.  History of chronic renal insufficiency.  9.  Smoker.   DISCHARGE MEDICATIONS:  1.  Wellbutrin XL 150 mg p.o. daily.  2.  Flomax 0.4 mg p.o. daily.  3.  Metoprolol 25 mg p.o. b.i.d., (was on Toprol-XL 25 mg p.o. daily).  4.  Protonix 40 mg p.o. daily.  5.  Enteric-coated aspirin 81 mg p.o. daily.  6.  Lisinopril has been discontinued until reviewed by primary MD.  7.  Tiazac has been discontinued until reviewed by primary MD.  8.  Coumadin, this has been discontinued indefinitely, secondary to GI      bleed/anemia.   PROCEDURE:  1.  Chest x-ray October 26, 2005 showed patchy opacities of the left lung base.      These were decreased in size, compared to the perfusion abnormality on      the patient's V/Q scan.  It likely was areas of atelectasis.  2.  Status post IVC filter October 28, 2005 by Dr. Oley Balm      interventional radiologist.  3.  EGD October 27, 2005 by Dr. Rob Bunting.  This was  normal, apart from      hiatal hernia and showed no obvious explanation for melena associated      anemia.  4.  Colonoscopy October 28, 2005 by Dr. Rachael Fee.  This showed single      colon polyp, otherwise unremarkable.  Polyp was considered unlikely      source of his persistent GI bleeding.  Pathology report confirmed      hyperplastic polyp.   CONSULTATIONS:  1.  Dr. Jeani Hawking, gastroenterologist.  2.  Dr. Rob Bunting, gastroenterologist.  3.  Dr. Deanne Coffer, interventional radiologist.   ADMISSION HISTORY:  Per the H&P note of October 26, 2005, dictated by Dr. Hannah Beat.  This is a 65 year old male, with known history of pulmonary  embolism diagnosed Sep 25, 2005, on anticoagulation, renal insufficiency,  coronary artery disease/ischemic cardiomyopathy with ejection fraction of  50%, history of hypertension,  tobacco abuse , pulmonary hypertension, and  previous history of bleeding peptic ulcer who presents with  lightheadedness/dizziness as well as dark-colored stools.  Initial  evaluation in the emergency department revealed an INR of 6.6 and a  hemoglobin of 6.7.  He was, therefore, admitted for further evaluation,  investigation, and management.   HOSPITAL COURSE:  1.  Profound anemia.  The patient presents with presyncopal symptoms,      hemoglobin of 6.7, dark-colored stools, heme-positive on guaiac testing,      against a background of anticoagulation therapy, with INR of 6.6.  It      was felt that the patient had an acute GI bleed against a background of      over anticoagulation.  He was, therefore, managed with a transfusion of      2 units of fresh frozen plasma and given vitamin K with satisfactory      reversal of anticoagulation.  He was subsequently transfused 3 units of      packed red blood cells, resulting in post-transfusion hemoglobin of 8.8      on October 27, 2005.  GI consultation was called.  This was kindly provided      by Dr. Jeani Hawking, and it  was felt that an upper/lower GI endoscopy      was indicated.  The patient was, therefore, placed on intravenous      heparin infusion.  Coumadin was discontinued and the patient      subsequently underwent upper GI endoscopy by Dr. Rob Bunting on October 27, 2005, which revealed a hiatal hernia, but no obvious source of      bleeding.  Colonoscopy was done on October 28, 2005 by Dr. Christella Hartigan and      showed a colonic polyp which to turned out be hyperplastic, but again,      no obvious source of bleeding.  Later, on October 27, 2005, hemoglobin      dropped to 8, necessitating transfusion of 2 more units of PRBCs.      Hemoglobin, thereafter, remained stable and as of October 30, 2005,      hemoglobin was 10.4, hematocrit of 30.2.   1.  Pulmonary embolism.  The patient had pulmonary embolism on Oct 06, 2005      and was commenced on anticoagulation.  Unfortunately, due to events      detailed in #1 above, particularly, persistent anemia requiring      transfusion of a total of 5 units PRBCs, it was felt that further      anticoagulation would be inimical to patient's continued wellbeing and      may cause life-threatening complications.  Although upper GI and lower      GI endoscopy showed no evidence of active bleeding lesions, there of      course remains a certain portion of the small bowel which was not      evaluated on this occasion.  These findings were extensively discussed      with the patient and his spouse in great detail and on balance,      consensus was to proceed with an IVC filter and discontinue the      anticoagulation.  Certainly the patient had no symptoms referable to      pulmonary embolism, i.e., no shortness of breath, no chest pain, and was      able to ambulate without any problems what so ever.  On October 28, 2005,      Dr. Deanne Coffer, interventional radiologist, kindly inserted a G2 IVC filter     without any complications and intravenous heparin was discontinued.  We       are pleased to note that the patient's hemoglobin levels have remained      stable thereafter.   1.  History of coronary artery disease.  There were no symptoms referable to      this, throughout the course of the patient's hospital stay.   1.  Smoking history.  The patient has been counseled appropriately, and was      managed with a Nicoderm CQ patch.   1.  Hypertension.  This remained controlled on beta blocker, during the      course of the patient's hospital stay.  Because he has remained      normotensive, lisinopril and Tiazac, which were his pre-admission      hypertensive medications, have not been restarted.  It is expected that      the patient's primary care physician will continue to monitor his blood      pressure and adjust medications as appropriate.   1.  History of chronic renal insufficiency.  The patient' renal indices      remained stable, during the course of hospital stay.  On October 29, 2005,      his BUN was 10 with a creatinine of 1.4.   DISPOSITION:  The patient was considered sufficiently recovered and  clinically stable to be discharged on October 30, 2005.  Diet, healthy heart,  activity as tolerated, wound care not applicable, pain management not  applicable.   FOLLOWUP INSTRUCTIONS:  The patient is to follow up with his primary MD, Dr.  Billee Cashing within 1 to 2 weeks.  He is also recommended to follow up  with Dr. Christella Hartigan, GI, at a time to be scheduled by Dr. Christella Hartigan office.  He has  been supplied with the appropriate telephone number.      Isidor Holts, M.D.  Electronically Signed     CO/MEDQ  D:  10/30/2005  T:  10/30/2005  Job:  [id number]   cc:   Lorelle Formosa, M.D.  Fax: [phone]   Rachael Fee, M.D.

## 2010-10-04 NOTE — H&P (Signed)
NAMEDANNER, PAULDING               ACCOUNT NO.:  [id number]   MEDICAL RECORD NO.:  [id number]          PATIENT TYPE:  EMS   LOCATION:  MAJO                         FACILITY:  [facility]   PHYSICIAN:  [name], M.D.DATE OF BIRTH:  1946/04/13   DATE OF ADMISSION:  06/23/2005  DATE OF DISCHARGE:                                HISTORY & PHYSICAL   PRIMARY CARE PHYSICIAN:  [name]   CHIEF COMPLAINT:  Chest pain.   HISTORY OF PRESENT ILLNESS:  [name] is a 65 year old smoker  with hypertension and hypercholesterolemia, but no history of diabetes and  an unconfirmed family history who he was at work exerting himself today,  when he had the onset of nausea. He felt that he was going to vomit. He went  to the bathroom and vomited. He then went back to work. He had a second  episode of nausea. He went back into the bathroom. There, he vomited a  second time. There was no hematemesis. The patient did become diaphoretic.  He then stood from the toilet to walk out. After walking out of the bathroom  he then suffered the abrupt onset of a sharp, stabbing-type, left-sided  chest pain. This pain moved in a vertical fashion from the top of the left  chest to the mid left chest. It was associated with a numbness in the left  arm. It was associated with a sensation of not being able to catch his  breath. A co-worker saw the patient, saw that he appeared very pale. The  patient was dizzy at the time. He then was encouraged to sit down by his co-  worker, taken to the supervisor's office, and EMS was consulted. At present  the patient reports that his pain has resolved. On a detailed review of  systems, however, he does admit that in the last few months he has noted  significant dyspnea on exertion. He cannot walk as far as he could even six  months ago without becoming short of breath. Intermittently when he  ambulates he gets numbness in his left arm and left leg.   REVIEW OF  SYSTEMS:  A comprehensive review of systems is unremarkable with  the exception of elements noted in the history of present illness above.   PAST MEDICAL HISTORY:  1.  Tobacco abuse in the amount of one pack per day since the patient's teen      years.  2.  Hypercholesterolemia.  3.  Status post cholecystectomy.  4.  Status post appendectomy.  5.  Hypertension.   MEDICATIONS:  1.  Lipitor 20 mg q.h.s.  2.  Hydrochlorothiazide 25 mg daily.   ALLERGIES:  No known drug allergies.   FAMILY HISTORY:  The patient's mother did not have any coronary artery  disease. The patient's father did not have any coronary artery disease. The  patient has a brother who he thinks may have had coronary artery disease,  but he cannot confirm this.   SOCIAL HISTORY:  The patient does not drink. He works in the post office. He  is married.  DATA REVIEW:  Electrolytes are balanced. BUN is normal at 15. Creatinine is  elevated at 1.3. Serum glucose is elevated at 109. CBC is not obtained.  Point of care cardiac markers reveal an elevated myoglobin at presentation  at 246, which has decreased on the second check to 118. CK-MB and troponin  are unremarkable on two check thus far. INR is normal. BNP is less than 30.  His pH is 7.35, PCO2 49, and PO2 is not available on venous gas.   Chest x-ray reveals no acute disease. A 12-lead EKG reveals normal sinus  rhythm at 73 beats per minute, but I do appreciate some ST elevation in V1,  V2, and aVM. I also appreciate T-wave inversion in II, III, and aVF as well  as V4, V5, and V6.   PHYSICAL EXAMINATION:  VITAL SIGNS: Temperature 98.3, [name] pressure 138/93,  respiratory rate 20, heart rate 57, O2 saturation 97% on 2 liters per minute  nasal cannula.  GENERAL: Well-developed, well-nourished male in no acute respiratory  distress.  HEENT: Normocephalic and atraumatic. Pupils equal, round, and reactive to  light and accommodation. Extraocular muscles intact.  OC/P clear.  NECK: No lymphadenopathy. No thyromegaly. JVD to the angle of the jaw at 30  degrees.  LUNGS: Clear to auscultation bilaterally without wheezes or rhonchi.  ABDOMEN: Mildly obese, soft, bowel sounds present. No hepatosplenomegaly,  rebound, or ascites. Nontender, nondistended.  EXTREMITIES: No clubbing, cyanosis, or edema in the bilateral lower  extremities.  NEUROLOGIC: Cranial nerves II-XII intact bilaterally. Alert and oriented  times four. 5/5 strength in bilateral upper and lower extremities. Intact  sensation and touch throughout.   IMPRESSION AND PLAN:  1.  Atypical chest pain--the onset of the patient's symptoms was actually      nausea. This then led to vomiting and then atypical sounding chest pain.      The thing that concerns me however is the patient's dyspnea on exertion.      His lightheadedness and his diaphoresis is also quite concerning. There      are a multitude of things that could have caused the patient's symptoms.      Nonetheless, he has EKG changes that are also frightening and given that      this is a 65 year old gentleman who has smoked at least a pack per day      for over 45 years, had hypercholesterolemia and poorly controlled      hypertension, I feel that his pretest probability for coronary artery      disease is extremely high. I will admit him to the hospital. He will be      placed on full dose heparin, nitroglycerin, beta blocker, and aspirin.      He will be given oxygen and narcotic pain medications as appropriate. We      will check serial enzymes and EKG in the morning. Also, requesting that      [facility] see the patient for official risk stratification and      suspect that he will likely need a cardiac catheterization. The patient      specifically requested the [facility].  2.  JVD--the patient has clear JVD to the angle of the jaw on physical exam     at approximately 30 degrees in bed. His BNP is actually normal.  I am      concerned about this finding in this patient who has no history of heart      disease, however.  I will initiate workup with an echocardiogram and      suspect that the patient will also get a cardiac catheterization during      this hospital stay. There is no evidence of severe volume overload,      however, and therefore I will avoid diuresis.  3.  Hypertension--I will hold the patient's hydrochlorothiazide and dose the      patient with Lopressor in this setting. We will follow him closely and      adjust medication further as appropriate.  4.  Hypercholesterolemia--we will continue Lipitor on a daily basis and I      will also check fasting lipid panel to see if we do not need to increase      his Lipitor further.  5.  Mild renal insufficiency--I have no baseline laboratory values on this      patient.  His creatinine is 1.3. While this does technically within a      suspected normal range, the patient is not an exceedingly large man, and      this to me reflects a mild degree of decrease in GFR. This is most      likely secondary to hydrochlorothiazide and possibly poor intake as well      as hypertension. There is no evidence of severe or acute renal failure.      I will hydrate the patient and we will follow up his BUN/creatinine      ratio in the morning.  6.  Mildly elevated serum glucose--I will check a hemoglobin A1c in this      patient. He has no family history and no personal history of diabetes.      It is most likely this is simply related to his acute stress. We will      check a fasting CBG in the morning as well.  7.  Tobacco abuse--I have counseled the patient extensively myself on the      need to discontinue tobacco abuse. We will request tobacco cessation      consultation to further drive this point home.      [name], M.D.  Electronically Signed     JTM/MEDQ  D:  06/23/2005  T:  06/23/2005  Job:  [id number]   cc:   Lorelle Formosa,  M.D.  Fax: [phone]

## 2010-10-04 NOTE — Discharge Summary (Signed)
NAMESAAJAN, [name]               ACCOUNT NO.:  [id number]   MEDICAL RECORD NO.:  [id number]          PATIENT TYPE:  INP   LOCATION:  3731                         FACILITY:  [facility]   PHYSICIAN:  Hillery Aldo, M.D.   DATE OF BIRTH:  08-25-1945   DATE OF ADMISSION:  10/05/2005  DATE OF DISCHARGE:  10/09/2005                                 DISCHARGE SUMMARY   PRIMARY CARE PHYSICIAN:  Lorelle Formosa, M.D.   CARDIOLOGIST:  Learta Codding, M.D. [facility].   NEUROLOGIST:  Bertram Millard. Dahlstedt, M.D.   DISCHARGE DIAGNOSES:  1.  Pulmonary embolus.  2.  Chest pain secondary to #1.  3.  Chronic renal insufficiency.  4.  Congestive heart failure, compensated.  5.  Hypertension.  6.  Coronary artery disease.  7.  Ongoing tobacco abuse.  8.  Depression.  9.  Gastroesophageal reflux disease.  10. Dyslipidemia.  11. 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   DISCHARGE MEDICATIONS:  1.  Lovenox 80 mg subcutaneous injection q.12 h. until INR therapeutic.  2.  Coumadin 5 mg daily or as directed.  3.  Simvastatin 40 mg daily.  4.  Plendil 20 mg daily.  5.  Aspirin 81 mg daily.  6.  Wellbutrin XL 150 mg daily.  7.  Diltiazem 120 mg daily.  8.  Toprol XL 25 mg daily.  9.  Percocet 5/325, 1-2 tabs q.6 h. p.r.n. pain.   BRIEF ADMISSION HISTORY OF PRESENT ILLNESS:  The patient is 65 year old male  with nonobstructive coronary artery disease by cardiac catheterization in  February 2007 who was admitted for workup of chest pain.  He underwent V/Q  scanning which was positive for high probability of left-sided pulmonary  embolism and was admitted for anticoagulation therapy.   PROCEDURES AND DIAGNOSTIC STUDIES:  1.  Chest x-ray on Oct 05, 2005 showed no acute abnormalities.  The heart      was mildly enlarged.  There was no heart failure, infiltrate, or      effusion.  2.  V/Q scan on Oct 06, 2005 showed two large defect in the left lung, with      imaging findings compatible  with a high probability for pulmonary      embolus.  3.  Renal ultrasound on Oct 06, 2005 showed no evidence for hydronephrosis.      A 4 cm exophytic mass in the bladder lumen was noted.  Recommendations      of cystoscopy or cystography for further evaluation were made.   DISCHARGE LABORATORY VALUES:  Hemoglobin was 15, hematocrit 45, white blood  cell count 12.2, platelet count 190.  PT was 20.3, INR 1.7.  Sodium was 134,  potassium 4, chloride 99, bicarbonate 27, BUN 9, creatinine 1.5, glucose  148.   HOSPITAL COURSE BY PROBLEM:  1.  Pulmonary embolus:  The patient was admitted with a working diagnosis of      pulmonary embolus.  He was started on Lovenox and Coumadin for      therapeutic anticoagulation.  Factor V Leiden studies were sent and are  pending at the time of dictation.  Because of complaints of ongoing      chest pain, cardiology consultation was requested and kindly provided by      Dr. Eden Emms.  Dr. Eden Emms did feel that the patient's chest pain was      consistent with his pulmonary embolus and did not feel any further      cardiac evaluation was warranted.  Over the course of his      hospitalization, his chest pain did subside significantly.  At this      time, he is stable for discharge and will be sent home with home Lovenox      injections until his INR is therapeutic.  2.  Chronic renal insufficiency:  The patient's creatinine was stable, but      he does have chronic kidney disease.  This prompted the renal      ultrasound.  There was no evidence of obstruction.  He will likely need      to be set up for outpatient nephrology evaluation to preserve his      remaining kidney function.  3.  Nonobstructive coronary artery disease, status post cardiac      catheterization back in February 2007.  The patient did have cardiac      enzymes cycled, and he did have mild elevation of his troponins.  This      is more likely reflective of his renal disease than a  cardiac event.      Nonetheless, he was evaluated by cardiology.  His medications were      maximized, and he was discharged on the above-noted medications.  4.  Tobacco abuse.  The patient was started on a nicotine patch and      Wellbutrin to help with cravings.  He will be discharged on Wellbutrin,      with encouragement continued use nicotine patches and to avoid all      tobacco products.  5.  Depression.  Because of his depression, Wellbutrin was thought to be a      good choice of antidepressant medication, given his concurrent tobacco      abuse.  He did complain of some mild restlessness and twitching of the      muscles which may be secondary to Wellbutrin.  If this persists,      consideration can be made for withdrawing this medication.  6.  Gastroesophageal reflux disease.  The patient remained on proton pump      inhibitor therapy and was asymptomatic.  7.  Dyslipidemia:  The patient did remain on statin therapy.  His      cholesterol was checked, with a total cholesterol found to be 166,      triglycerides 98, HDL 28, LDL 118.  8.  Exophytic bladder mass.  The exophytic bladder mass was found      incidentally when a renal ultrasound was requested to review the anatomy      of his kidneys.  A urologic consultation was requested and kindly      provided by Dr. Retta Diones, who checked a urinalysis which was normal.      The differential included increased median lobe of the prostate versus      bladder tumor.  Dr. Retta Diones did not feel that he needed any further      evaluation while on anticoagulation due to the risk of bleeding, but      will follow up with him as an outpatient.  DISPOSITION:  The patient is discharged home.  He will receive home health  nursing services for administration of Lovenox.  He has a follow-up  appointment for a PT/INR checked with Dr. Ronne Binning at [facility] on Oct 10, 2005.  Further management of his anticoagulation will be done by his primary  care physician.           ______________________________  Hillery Aldo, M.D.     CR/MEDQ  D:  10/09/2005  T:  10/10/2005  Job:  [id number]   cc:   Lorelle Formosa, M.D.  Fax: [phone]   Learta Codding, M.D. [facility]  [address]  [address]  [place]  [place] [address]   Bertram Millard. Dahlstedt, M.D.  Fax: [phone]

## 2010-10-08 ENCOUNTER — Other Ambulatory Visit: Payer: Medicare Other | Admitting: *Deleted

## 2010-10-10 ENCOUNTER — Telehealth: Payer: Self-pay | Admitting: Cardiology

## 2010-10-10 ENCOUNTER — Encounter: Payer: Medicare Other | Admitting: *Deleted

## 2010-10-10 NOTE — Telephone Encounter (Signed)
I talked with pt. Pt states he will come to our office June 5 to pick up paperwork. Rene Kocher in HIM had the paperwork and I have left a message for her that pt pick up paperwork June 5.

## 2010-10-10 NOTE — Telephone Encounter (Signed)
Per pt calling, no information was given, only wants to speak with anne

## 2010-10-22 ENCOUNTER — Encounter: Payer: Medicare Other | Admitting: *Deleted

## 2010-12-17 ENCOUNTER — Encounter: Payer: Self-pay | Admitting: Cardiology

## 2010-12-18 ENCOUNTER — Other Ambulatory Visit (HOSPITAL_COMMUNITY): Payer: Medicare Other | Admitting: Radiology

## 2010-12-24 ENCOUNTER — Ambulatory Visit: Payer: Medicare Other | Admitting: Cardiology

## 2011-01-01 ENCOUNTER — Encounter: Payer: Self-pay | Admitting: Cardiology

## 2011-01-28 ENCOUNTER — Emergency Department (HOSPITAL_COMMUNITY)
Admission: EM | Admit: 2011-01-28 | Discharge: 2011-01-28 | Disposition: A | Discharge status: Home / Self Care | Payer: Medicare Other | Attending: Emergency Medicine | Admitting: Emergency Medicine

## 2011-01-28 DIAGNOSIS — E119 Type 2 diabetes mellitus without complications: Secondary | ICD-10-CM | POA: Insufficient documentation

## 2011-01-28 DIAGNOSIS — F17200 Nicotine dependence, unspecified, uncomplicated: Secondary | ICD-10-CM | POA: Insufficient documentation

## 2011-01-28 DIAGNOSIS — I10 Essential (primary) hypertension: Secondary | ICD-10-CM | POA: Insufficient documentation

## 2011-01-28 DIAGNOSIS — I2510 Atherosclerotic heart disease of native coronary artery without angina pectoris: Secondary | ICD-10-CM | POA: Insufficient documentation

## 2011-01-28 DIAGNOSIS — R609 Edema, unspecified: Secondary | ICD-10-CM | POA: Insufficient documentation

## 2011-01-28 DIAGNOSIS — M7989 Other specified soft tissue disorders: Secondary | ICD-10-CM | POA: Insufficient documentation

## 2011-01-28 LAB — GLUCOSE, CAPILLARY: Glucose-Capillary: 104 mg/dL — ABNORMAL HIGH (ref 70–99)

## 2011-02-06 LAB — DIFFERENTIAL
Basophils Absolute: 0
Basophils Absolute: 0
Basophils Relative: 0
Basophils Relative: 0
Eosinophils Absolute: 0.2
Eosinophils Absolute: 0.4
Eosinophils Relative: 3
Eosinophils Relative: 5
Lymphocytes Relative: 7 — ABNORMAL LOW
Lymphocytes Relative: 8 — ABNORMAL LOW
Lymphs Abs: 0.5 — ABNORMAL LOW
Lymphs Abs: 0.8
Monocytes Absolute: 0.5
Monocytes Absolute: 0.8
Monocytes Relative: 6
Monocytes Relative: 8
Neutro Abs: 6.3
Neutro Abs: 7.9 — ABNORMAL HIGH
Neutrophils Relative %: 81 — ABNORMAL HIGH
Neutrophils Relative %: 81 — ABNORMAL HIGH

## 2011-02-06 LAB — CBC
HCT: 37.8 — ABNORMAL LOW
HCT: 42.5
Hemoglobin: 12.3 — ABNORMAL LOW
Hemoglobin: 13.9
MCHC: 32.5
MCV: 88.2
MCV: 88.6
Platelets: 383
RBC: 4.29
RDW: 14
RDW: 14.4
WBC: 7.8

## 2011-02-06 LAB — URINALYSIS, ROUTINE W REFLEX MICROSCOPIC
Glucose, UA: NEGATIVE
Hgb urine dipstick: NEGATIVE
Specific Gravity, Urine: 1.023

## 2011-02-06 LAB — BASIC METABOLIC PANEL
BUN: 13
CO2: 27
Calcium: 9.2
Chloride: 101
Creatinine, Ser: 1.48
GFR calc Af Amer: 59 — ABNORMAL LOW
GFR calc non Af Amer: 49 — ABNORMAL LOW
Glucose, Bld: 103 — ABNORMAL HIGH
Potassium: 3.8
Sodium: 138

## 2011-02-06 LAB — I-STAT 8, (EC8 V) (CONVERTED LAB)
Chloride: 105
HCT: 44
Hemoglobin: 15
Operator id: 198171
Potassium: 3.8
TCO2: 28
pCO2, Ven: 52.6 — ABNORMAL HIGH

## 2011-02-06 LAB — TYPE AND SCREEN
ABO/RH(D): B POS
Antibody Screen: NEGATIVE

## 2011-02-06 LAB — PROTIME-INR
INR: 2.3 — ABNORMAL HIGH
INR: 4.1 — ABNORMAL HIGH
Prothrombin Time: 25.9 — ABNORMAL HIGH
Prothrombin Time: 41 — ABNORMAL HIGH

## 2011-02-06 LAB — POCT I-STAT CREATININE: Creatinine, Ser: 1.6 — ABNORMAL HIGH

## 2011-02-06 LAB — OCCULT BLOOD X 1 CARD TO LAB, STOOL: Fecal Occult Bld: POSITIVE

## 2011-02-07 ENCOUNTER — Emergency Department (HOSPITAL_COMMUNITY)
Admission: EM | Admit: 2011-02-07 | Discharge: 2011-02-07 | Disposition: A | Discharge status: Home / Self Care | Payer: Medicare Other | Attending: Emergency Medicine | Admitting: Emergency Medicine

## 2011-02-07 DIAGNOSIS — R040 Epistaxis: Secondary | ICD-10-CM | POA: Insufficient documentation

## 2011-02-07 DIAGNOSIS — I10 Essential (primary) hypertension: Secondary | ICD-10-CM | POA: Insufficient documentation

## 2011-02-07 DIAGNOSIS — E119 Type 2 diabetes mellitus without complications: Secondary | ICD-10-CM | POA: Insufficient documentation

## 2011-02-07 DIAGNOSIS — R000 Tachycardia, unspecified: Secondary | ICD-10-CM | POA: Insufficient documentation

## 2011-02-07 DIAGNOSIS — Z794 Long term (current) use of insulin: Secondary | ICD-10-CM | POA: Insufficient documentation

## 2011-02-12 LAB — COMPREHENSIVE METABOLIC PANEL
ALT: 22
ALT: 28
AST: 35
Alkaline Phosphatase: 78
CO2: 25
CO2: 26
Chloride: 109
Creatinine, Ser: 1.37
GFR calc Af Amer: >60
GFR calc non Af Amer: 51 — ABNORMAL LOW
GFR calc non Af Amer: 53 — ABNORMAL LOW
Glucose, Bld: 117 — ABNORMAL HIGH
Potassium: 4
Sodium: 136
Sodium: 140
Total Bilirubin: 0.4
Total Bilirubin: 0.5

## 2011-02-12 LAB — URINALYSIS, MICROSCOPIC ONLY
Glucose, UA: NEGATIVE
Ketones, ur: NEGATIVE
Leukocytes, UA: NEGATIVE
Protein, ur: NEGATIVE
Urobilinogen, UA: 0.2

## 2011-02-12 LAB — CBC
HCT: 37.5 — ABNORMAL LOW
HCT: 38.3 — ABNORMAL LOW
Hemoglobin: 12.4 — ABNORMAL LOW
Hemoglobin: 12.5 — ABNORMAL LOW
MCV: 81.9
MCV: 82.1
Platelets: 198
RBC: 4.57
RBC: 4.77
RDW: 14.2
WBC: 5.6
WBC: 6.2

## 2011-02-12 LAB — POCT I-STAT, CHEM 8
BUN: 16
Calcium, Ion: 1.2
Chloride: 104
HCT: 46
Potassium: 4.5

## 2011-02-12 LAB — HEMOGLOBIN A1C: Hgb A1c MFr Bld: 7.1 — ABNORMAL HIGH

## 2011-02-12 LAB — PROTIME-INR
INR: 1
Prothrombin Time: 13.4

## 2011-02-12 LAB — URINE CULTURE
Colony Count: NO GROWTH
Culture: NO GROWTH

## 2011-02-12 LAB — CULTURE, BLOOD (ROUTINE X 2): Culture: NO GROWTH

## 2011-02-13 LAB — BASIC METABOLIC PANEL
CO2: 26
Chloride: 108
GFR calc Af Amer: >60
Sodium: 140

## 2011-02-13 LAB — CBC
Hemoglobin: 12.6 — ABNORMAL LOW
MCHC: 32.6
MCV: 82.2
RBC: 4.7

## 2011-02-19 ENCOUNTER — Emergency Department (HOSPITAL_COMMUNITY): Payer: Medicare Other

## 2011-02-19 ENCOUNTER — Inpatient Hospital Stay (HOSPITAL_COMMUNITY)
Admission: EM | Admit: 2011-02-19 | Discharge: 2011-02-20 | DRG: 811 | Discharge status: Against Medical Advice | Payer: Medicare Other | Attending: Internal Medicine | Admitting: Internal Medicine

## 2011-02-19 DIAGNOSIS — E119 Type 2 diabetes mellitus without complications: Secondary | ICD-10-CM | POA: Diagnosis present

## 2011-02-19 DIAGNOSIS — I428 Other cardiomyopathies: Secondary | ICD-10-CM | POA: Diagnosis present

## 2011-02-19 DIAGNOSIS — Z86711 Personal history of pulmonary embolism: Secondary | ICD-10-CM

## 2011-02-19 DIAGNOSIS — I509 Heart failure, unspecified: Secondary | ICD-10-CM | POA: Diagnosis present

## 2011-02-19 DIAGNOSIS — N179 Acute kidney failure, unspecified: Secondary | ICD-10-CM | POA: Diagnosis present

## 2011-02-19 DIAGNOSIS — R040 Epistaxis: Secondary | ICD-10-CM | POA: Diagnosis present

## 2011-02-19 DIAGNOSIS — N183 Chronic kidney disease, stage 3 unspecified: Secondary | ICD-10-CM | POA: Diagnosis present

## 2011-02-19 DIAGNOSIS — Z860100 Personal history of colon polyps, unspecified: Secondary | ICD-10-CM

## 2011-02-19 DIAGNOSIS — Z86718 Personal history of other venous thrombosis and embolism: Secondary | ICD-10-CM

## 2011-02-19 DIAGNOSIS — M545 Low back pain, unspecified: Secondary | ICD-10-CM | POA: Diagnosis present

## 2011-02-19 DIAGNOSIS — I2510 Atherosclerotic heart disease of native coronary artery without angina pectoris: Secondary | ICD-10-CM | POA: Diagnosis present

## 2011-02-19 DIAGNOSIS — I4891 Unspecified atrial fibrillation: Secondary | ICD-10-CM | POA: Diagnosis present

## 2011-02-19 DIAGNOSIS — K31811 Angiodysplasia of stomach and duodenum with bleeding: Secondary | ICD-10-CM | POA: Diagnosis present

## 2011-02-19 DIAGNOSIS — Z79899 Other long term (current) drug therapy: Secondary | ICD-10-CM

## 2011-02-19 DIAGNOSIS — Z7901 Long term (current) use of anticoagulants: Secondary | ICD-10-CM

## 2011-02-19 DIAGNOSIS — I517 Cardiomegaly: Secondary | ICD-10-CM | POA: Diagnosis present

## 2011-02-19 DIAGNOSIS — D62 Acute posthemorrhagic anemia: Principal | ICD-10-CM | POA: Diagnosis present

## 2011-02-19 DIAGNOSIS — T45515A Adverse effect of anticoagulants, initial encounter: Secondary | ICD-10-CM | POA: Diagnosis present

## 2011-02-19 DIAGNOSIS — N400 Enlarged prostate without lower urinary tract symptoms: Secondary | ICD-10-CM | POA: Diagnosis present

## 2011-02-19 DIAGNOSIS — F17200 Nicotine dependence, unspecified, uncomplicated: Secondary | ICD-10-CM | POA: Diagnosis present

## 2011-02-19 DIAGNOSIS — K219 Gastro-esophageal reflux disease without esophagitis: Secondary | ICD-10-CM | POA: Diagnosis present

## 2011-02-19 DIAGNOSIS — I129 Hypertensive chronic kidney disease with stage 1 through stage 4 chronic kidney disease, or unspecified chronic kidney disease: Secondary | ICD-10-CM | POA: Diagnosis present

## 2011-02-19 DIAGNOSIS — I5042 Chronic combined systolic (congestive) and diastolic (congestive) heart failure: Secondary | ICD-10-CM | POA: Diagnosis present

## 2011-02-19 DIAGNOSIS — G8929 Other chronic pain: Secondary | ICD-10-CM | POA: Diagnosis present

## 2011-02-19 DIAGNOSIS — I872 Venous insufficiency (chronic) (peripheral): Secondary | ICD-10-CM | POA: Diagnosis present

## 2011-02-19 LAB — BASIC METABOLIC PANEL
CO2: 23 mEq/L (ref 19–32)
Calcium: 8.5 mg/dL (ref 8.4–10.5)
Chloride: 110 mEq/L (ref 96–112)
Glucose, Bld: 142 mg/dL — ABNORMAL HIGH (ref 70–99)
Sodium: 141 mEq/L (ref 135–145)

## 2011-02-19 LAB — PROTIME-INR
INR: 5.93 (ref 0.00–1.49)
Prothrombin Time: 53.8 seconds — ABNORMAL HIGH (ref 11.6–15.2)

## 2011-02-19 LAB — DIFFERENTIAL
Eosinophils Relative: 3 % (ref 0–5)
Lymphs Abs: 0.7 10*3/uL (ref 0.7–4.0)
Monocytes Relative: 19 % — ABNORMAL HIGH (ref 3–12)
Neutro Abs: 2 10*3/uL (ref 1.7–7.7)

## 2011-02-19 LAB — CBC
HCT: 20.4 % — ABNORMAL LOW (ref 39.0–52.0)
Hemoglobin: 5.7 g/dL — CL (ref 13.0–17.0)
MCH: 18 pg — ABNORMAL LOW (ref 26.0–34.0)
MCHC: 27.9 g/dL — ABNORMAL LOW (ref 30.0–36.0)

## 2011-02-19 LAB — POCT I-STAT TROPONIN I

## 2011-02-20 LAB — CARDIAC PANEL(CRET KIN+CKTOT+MB+TROPI)
CK, MB: 5.8 ng/mL — ABNORMAL HIGH (ref 0.3–4.0)
Relative Index: 1.8 (ref 0.0–2.5)
Total CK: 328 U/L — ABNORMAL HIGH (ref 7–232)
Troponin I: <0.3 ng/mL (ref <0.30)

## 2011-02-20 LAB — BASIC METABOLIC PANEL
CO2: 22 mEq/L (ref 19–32)
Chloride: 107 mEq/L (ref 96–112)
GFR calc Af Amer: 50 mL/min — ABNORMAL LOW (ref >90)
Potassium: 3.7 mEq/L (ref 3.5–5.1)
Sodium: 141 mEq/L (ref 135–145)

## 2011-02-20 LAB — GLUCOSE, CAPILLARY: Glucose-Capillary: 148 mg/dL — ABNORMAL HIGH (ref 70–99)

## 2011-02-20 LAB — HEPATIC FUNCTION PANEL
ALT: 37 U/L (ref 0–53)
Bilirubin, Direct: 0.5 mg/dL — ABNORMAL HIGH (ref 0.0–0.3)
Indirect Bilirubin: 2 mg/dL — ABNORMAL HIGH (ref 0.3–0.9)
Total Protein: 7.4 g/dL (ref 6.0–8.3)

## 2011-02-20 LAB — PROTIME-INR: Prothrombin Time: 17.4 seconds — ABNORMAL HIGH (ref 11.6–15.2)

## 2011-02-20 LAB — HEMOGLOBIN AND HEMATOCRIT, BLOOD: HCT: 28 % — ABNORMAL LOW (ref 39.0–52.0)

## 2011-02-20 LAB — PRO B NATRIURETIC PEPTIDE: Pro B Natriuretic peptide (BNP): 1601 pg/mL — ABNORMAL HIGH (ref 0–125)

## 2011-02-21 LAB — CROSSMATCH
ABO/RH(D): B POS
Antibody Screen: NEGATIVE
Unit division: 0

## 2011-02-21 LAB — PREPARE FRESH FROZEN PLASMA
Unit division: 0
Unit division: 0

## 2011-02-21 LAB — CBC
HCT: 41.7
Hemoglobin: 13.6
MCHC: 32.5
MCV: 88.4
MCV: 88.4
MCV: 88.5
Platelets: 172
Platelets: 187
Platelets: 191
Platelets: 196
RBC: 4.48
RBC: 4.57
RDW: 13.9
RDW: 14.4
RDW: 14.4
WBC: 12.6 — ABNORMAL HIGH
WBC: 8.3

## 2011-02-21 LAB — COMPREHENSIVE METABOLIC PANEL
Albumin: 3.7
Alkaline Phosphatase: 87
BUN: 22
Creatinine, Ser: 1.53 — ABNORMAL HIGH
Glucose, Bld: 310 — ABNORMAL HIGH
Potassium: 4.4
Total Bilirubin: 0.8
Total Protein: 7.1

## 2011-02-21 LAB — HEMOGLOBIN A1C: Mean Plasma Glucose: 204

## 2011-02-21 LAB — PROTIME-INR
INR: 1
INR: 1
INR: 1.1
Prothrombin Time: 13.6
Prothrombin Time: 13.7
Prothrombin Time: 13.8
Prothrombin Time: 14.8

## 2011-02-21 LAB — BASIC METABOLIC PANEL
BUN: 16
BUN: 21
Calcium: 8.6
Calcium: 8.9
Creatinine, Ser: 1.46
GFR calc non Af Amer: 49 — ABNORMAL LOW
GFR calc non Af Amer: 54 — ABNORMAL LOW
Glucose, Bld: 155 — ABNORMAL HIGH
Glucose, Bld: 173 — ABNORMAL HIGH
Potassium: 4.1
Sodium: 139

## 2011-02-21 LAB — APTT: aPTT: 23 — ABNORMAL LOW

## 2011-02-24 NOTE — H&P (Signed)
[name], [name]               ACCOUNT NO.:  [id number]  MEDICAL RECORD NO.:  [id number]  LOCATION:  3315                         FACILITY:  [facility]  PHYSICIAN:  Hillery Aldo, M.D.   DATE OF BIRTH:  22-Oct-1945  DATE OF ADMISSION:  02/19/2011 DATE OF DISCHARGE:                             HISTORY & PHYSICAL   PRIMARY CARE PHYSICIAN:  Betti D. Pecola Leisure, MD  CHIEF COMPLAINT:  Nosebleed, melena.  HISTORY OF PRESENT ILLNESS:  The patient is a 65 year old male who has been maintained on chronic Coumadin therapy for atrial fibrillation and prior DVTs who presents with a chief complaint of multiple episodes of epistaxis and melena.  The patient reports that he has had 2 weeks of intermittent epistaxis and was treated in the emergency department last week with cauterization and sent home.  He also reports that he had a hemoglobin done by his primary care physician on February 17, 2011, and at that time, his hemoglobin was found to be 8 mg/dL.  He states that his physician reviewed his medications and discovered that he was still taking Coumadin even though he was instructed to discontinue this medicine in the past.  The patient has therefore not had Coumadin for the past 3 days.  The patient reports multiple black tarry stools and ultimately presented to the hospital feeling fatigued and weak.  Upon initial evaluation in the emergency department, hemoglobin was checked and found to be 5.7.  He subsequently was referred to the hospitalist service for further evaluation and treatment.  PAST MEDICAL HISTORY: 1. Hypertension. 2. Type 2 diabetes with history of admission for hyperosmolar     nonketotic state. 3. Atrial fibrillation. 4. Gastroesophageal reflux disease. 5. Hyperlipidemia. 6. History of systolic and diastolic congestive heart failure, last EF     35%. 7. History of nonischemic cardiomyopathy. 8. Coronary artery disease, last catheterization October 2011, showed     no  angiographic coronary disease in the setting of a falsely     positive Myoview. 9. 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 10.History of multiple DVTs, status post IVC filter. 11.History of chronic kidney disease, baseline creatinine 1.32 (stage     II chronic kidney disease). 12.Benign prostatic hypertrophy. 13.Chronic lower back pain. 14.Chronic venous insufficiency.  FAMILY HISTORY:  The patient's mother died at age 53 from a stroke.  The patient's father died from trauma at age 59.  The patient has 4 living sisters and 3 living brothers, several with diabetes.  SOCIAL HISTORY:  The patient is divorced.  He has a 47 pack-year history of tobacco abuse, currently smoking one pack per day.  Denies alcohol or drug abuse.  He works part-time as a Electrical engineer.  ALLERGIES:  No known drug allergies.  MEDICATIONS:  The patient is unsure of his medications and did not bring his medications with him for review.  These will need to be verified by the pharmacy.  REVIEW OF SYSTEMS:  CONSTITUTIONAL:  No fever or chills.  No weight changes.  HEENT:  Negative.  CARDIOVASCULAR:  No chest pain or dysrhythmia.  RESPIRATORY:  Chronic dyspnea, dry cough.  GI:  Positive for melena.  No nausea or vomiting.  GU:  No hematuria.  NEUROLOGIC:  No dizziness.  Positive for fatigue.  Comprehensive 14-point review of systems otherwise unremarkable.  PHYSICAL EXAMINATION:  VITAL SIGNS:  Temperature 98.1, pulse 103, respirations 19, blood pressure 144/96 and O2 saturation 100% on room air. GENERAL:  Well-developed, well-nourished, pale-appearing male, in no acute distress. HEENT:  Normocephalic, atraumatic.  PERRL.  EOMI.  Oropharynx is clear. NECK:  Supple, no thyromegaly, no lymphadenopathy.  Positive for jugular venous distention. CHEST:  Lungs clear to auscultation bilaterally with good air movement. HEART:  Regular rate, rhythm.  No murmurs, rubs, or gallops. ABDOMEN:  Soft, mildly  distended.  Positive bowel sounds in all 4 quadrants. EXTREMITIES:  Tense edema bilaterally. SKIN:  Pale. NEUROLOGIC:  The patient is alert and oriented x3.  Cranial nerves II through XII grossly intact.  Nonfocal.  DATA REVIEW:  Chest x-ray shows mild cardiomegaly with no congestive heart failure.  An 12-lead EKG shows normal sinus rhythm at 99 beats per minute with diffuse T-wave abnormalities.  LABORATORY DATA:  White blood cell count is 3.5, hemoglobin 5.7, hematocrit 20.4 and platelets 366.  PT is 53.8, PTT 48.  INR is 5.93. BNP is 1342.  Troponin is 0.04.  Sodium is 141, potassium 3.7, chloride 110, bicarb 23, BUN 22, creatinine 1.84, glucose 142 and calcium 8.5.  ASSESSMENT AND PLAN: 1. Acute blood loss anemia secondary to epistaxis and gastrointestinal     bleeding in the setting of coagulopathy induced by Coumadin:  We     will admit the patient to the step-down unit.  We will give him 2     units of packed red blood cells and 2 units of fresh frozen plasma     along with vitamin K to reverse his coagulopathy.  He has a known     history of duodenal AVMs which is the most likely source of his     melena.  We will place him on PPI therapy and if he does not     experience rapid resolution of the gastrointestinal bleeding, we     would consult the Gastroenterology Department for further     evaluation. 2. Hypertension:  We will need to verify the patient's     antihypertensives, but would hold Lasix at this time.  We will     continue metoprolol which was listed as one of his medications in     the past.  We will hold for systolic blood pressure less than 110,     a pulse less than 60. 3. Type 2 diabetes:  We will cover the patient with sliding scale     insulin for now along with 10 units of Lantus.  We will check a     hemoglobin A1c. 4. Paroxysmal atrial fibrillation:  The patient is currently in normal     sinus rhythm. 5. Gastroesophageal reflux disease:  We will  continue the patient on     PPI therapy. 6. Hyperlipidemia:  We will order statin therapy based on his history     of being on a dose of Lipitor 20 mg daily. 7. Chronic systolic and diastolic heart failure:  The patient     currently appears to be well compensated.  We will cautiously     hydrate him and administer Lasix in between units of packed red     blood cells.  We will cycle cardiac markers q.6 h. x3 sets given     his profound anemia.  Obviously, no antiplatelet  therapy for now. 8. History of deep venous thrombosis:  We will use PAS hoses for DVT     prophylaxis. 9. Acute renal failure in the setting of stage II to III chronic     kidney disease:  We will hydrate the patient gently and monitor his     renal function. 10.Benign prostatic hypertrophy:  Continue the patient's Avodart, but     confirm that he is still taking this medicine.  Time spent on admission including face-to-face time is approximately 1 hour.     Hillery Aldo, M.D.     CR/MEDQ  D:  02/19/2011  T:  02/20/2011  Job:  [id number]  cc:   Jocelyn Lamer D. Pecola Leisure, M.D.  Electronically Signed by Hillery Aldo M.D. on 02/24/2011 07:16:21 PM

## 2011-03-15 ENCOUNTER — Emergency Department (HOSPITAL_COMMUNITY)
Admission: EM | Admit: 2011-03-15 | Discharge: 2011-03-16 | Discharge status: Against Medical Advice | Payer: Medicare Other | Attending: Emergency Medicine | Admitting: Emergency Medicine

## 2011-03-15 ENCOUNTER — Emergency Department (HOSPITAL_COMMUNITY): Payer: Medicare Other

## 2011-03-15 DIAGNOSIS — M79609 Pain in unspecified limb: Secondary | ICD-10-CM | POA: Insufficient documentation

## 2011-03-15 DIAGNOSIS — M7989 Other specified soft tissue disorders: Secondary | ICD-10-CM | POA: Insufficient documentation

## 2011-03-15 DIAGNOSIS — Z794 Long term (current) use of insulin: Secondary | ICD-10-CM | POA: Insufficient documentation

## 2011-03-15 DIAGNOSIS — R609 Edema, unspecified: Secondary | ICD-10-CM | POA: Insufficient documentation

## 2011-03-15 DIAGNOSIS — I509 Heart failure, unspecified: Secondary | ICD-10-CM | POA: Insufficient documentation

## 2011-03-15 DIAGNOSIS — D649 Anemia, unspecified: Secondary | ICD-10-CM | POA: Insufficient documentation

## 2011-03-15 DIAGNOSIS — I10 Essential (primary) hypertension: Secondary | ICD-10-CM | POA: Insufficient documentation

## 2011-03-15 DIAGNOSIS — I498 Other specified cardiac arrhythmias: Secondary | ICD-10-CM | POA: Insufficient documentation

## 2011-03-15 DIAGNOSIS — Z79899 Other long term (current) drug therapy: Secondary | ICD-10-CM | POA: Insufficient documentation

## 2011-03-15 DIAGNOSIS — E119 Type 2 diabetes mellitus without complications: Secondary | ICD-10-CM | POA: Insufficient documentation

## 2011-03-15 LAB — POCT I-STAT TROPONIN I: Troponin i, poc: 0.03 ng/mL (ref 0.00–0.08)

## 2011-03-15 LAB — POCT I-STAT, CHEM 8
Glucose, Bld: 117 mg/dL — ABNORMAL HIGH (ref 70–99)
HCT: 32 % — ABNORMAL LOW (ref 39.0–52.0)
Hemoglobin: 10.9 g/dL — ABNORMAL LOW (ref 13.0–17.0)
Potassium: 3.2 mEq/L — ABNORMAL LOW (ref 3.5–5.1)

## 2011-03-15 NOTE — Discharge Summary (Signed)
  NAMESHERRIL, HEYWARD               ACCOUNT NO.:  [id number]  MEDICAL RECORD NO.:  [id number]  LOCATION:  3315                         FACILITY:  [facility]  PHYSICIAN:  Richarda Overlie, MD       DATE OF BIRTH:  12/08/1945  DATE OF ADMISSION:  02/19/2011 DATE OF DISCHARGE:  02/20/2011                              DISCHARGE SUMMARY   Kindly note that the patient is leaving against medical advice.  DISCHARGE DIAGNOSES: 1. Melena. 2. Epistaxis, now resolved. 3. Supratherapeutic INR. 4. On anticoagulation for atrial fibrillation and pulmonary embolism. 5. Hypertension. 6. Type 2 diabetes. 7. Gastroesophageal reflux disease. 8. Systolic-diastolic heart failure. 9. Nonischemic cardiomyopathy. 10.Coronary artery disease. 11.Multiple deep venous thromboses. 12.Chronic kidney disease stage II. 13.Benign prostatic hypertrophy. 14.Chronic low back pain. 15.Chronic venous insufficiency.  SUBJECTIVE:  This is a 65 year old male, who presented to the ER on chronic Coumadin therapy for atrial fibrillation, prior DVTs.  The patient has had intermittent epistaxis for 2 weeks.  The patient was found to have a hemoglobin of 8 mg/dL on February 17, 2011.  He states that he had an EGD and colonoscopy at [facility] 3-4 months ago and was found to have colon polyps without any evidence of a colonic mass.  The patient was confused that warfarin was similar to Coumadin and continued to take warfarin and was found to have a supratherapeutic INR with a hemoglobin of 5.7 in the ER and an INR of 5.93.  When he presented to the ER yesterday, he was treated with 2 units of fresh frozen plasma and his coagulopathy was reversed with vitamin K.  He does have a history of duodenal AVMs and was placed on a PPI.  The patient is adamantly refusing to get a Gastroenterology consult.  He states that this is not required.  I have requested him to stay for another day so that we can monitor his hemoglobin closely to make  sure he is not bleeding on an ongoing basis.  However, the patient is adamant that he has to take care of his bills and see about his pay check yesterday and wants to leave the hospital.  I have explained to him the risk of death from recurrent GI bleeding as well as MI given his history of coronary artery disease and him being so anemic.  However, the patient is still adamant to leave and he will be leaving against medical advise.  No discharge instructions or prescriptions are being provided at this time.     Richarda Overlie, MD     NA/MEDQ  D:  02/20/2011  T:  02/20/2011  Job:  [id number]  Electronically Signed by Richarda Overlie MD on 03/15/2011 07:31:20 AM

## 2011-03-16 LAB — CBC
HCT: 28.5 % — ABNORMAL LOW (ref 39.0–52.0)
Hemoglobin: 8 g/dL — ABNORMAL LOW (ref 13.0–17.0)
MCH: 19.7 pg — ABNORMAL LOW (ref 26.0–34.0)
MCHC: 28.1 g/dL — ABNORMAL LOW (ref 30.0–36.0)

## 2011-03-16 LAB — DIFFERENTIAL
Basophils Relative: 1 % (ref 0–1)
Monocytes Absolute: 1.1 10*3/uL — ABNORMAL HIGH (ref 0.1–1.0)
Monocytes Relative: 18 % — ABNORMAL HIGH (ref 3–12)
Neutro Abs: 3.9 10*3/uL (ref 1.7–7.7)

## 2011-03-16 LAB — CK TOTAL AND CKMB (NOT AT ARMC): Total CK: 178 U/L (ref 7–232)

## 2011-03-16 LAB — PROTIME-INR: INR: 1.32 (ref 0.00–1.49)

## 2011-04-17 NOTE — Telephone Encounter (Signed)
Please close this encounter

## 2011-04-20 ENCOUNTER — Other Ambulatory Visit: Payer: Self-pay | Admitting: Cardiology

## 2011-04-21 NOTE — Telephone Encounter (Signed)
Can we refill this rx? Says we have before but not the primary source

## 2011-04-21 NOTE — Telephone Encounter (Signed)
This should be refilled by his PCP

## 2011-04-23 ENCOUNTER — Other Ambulatory Visit: Payer: Self-pay | Admitting: *Deleted

## 2011-04-23 MED ORDER — GLIPIZIDE 5 MG PO TABS: 5.0000 mg | ORAL_TABLET | Freq: Two times a day (BID) | ORAL | Status: DC

## 2011-04-28 ENCOUNTER — Encounter (HOSPITAL_COMMUNITY): Payer: Self-pay | Admitting: Emergency Medicine

## 2011-04-28 ENCOUNTER — Emergency Department (HOSPITAL_COMMUNITY)
Admission: EM | Admit: 2011-04-28 | Discharge: 2011-04-28 | Disposition: A | Discharge status: Home / Self Care | Payer: PRIVATE HEALTH INSURANCE | Attending: Emergency Medicine | Admitting: Emergency Medicine

## 2011-04-28 DIAGNOSIS — D649 Anemia, unspecified: Secondary | ICD-10-CM | POA: Insufficient documentation

## 2011-04-28 DIAGNOSIS — Z794 Long term (current) use of insulin: Secondary | ICD-10-CM | POA: Insufficient documentation

## 2011-04-28 DIAGNOSIS — N5089 Other specified disorders of the male genital organs: Secondary | ICD-10-CM | POA: Insufficient documentation

## 2011-04-28 DIAGNOSIS — N4889 Other specified disorders of penis: Secondary | ICD-10-CM | POA: Insufficient documentation

## 2011-04-28 DIAGNOSIS — I10 Essential (primary) hypertension: Secondary | ICD-10-CM | POA: Insufficient documentation

## 2011-04-28 DIAGNOSIS — E119 Type 2 diabetes mellitus without complications: Secondary | ICD-10-CM | POA: Insufficient documentation

## 2011-04-28 DIAGNOSIS — Z79899 Other long term (current) drug therapy: Secondary | ICD-10-CM | POA: Insufficient documentation

## 2011-04-28 DIAGNOSIS — N509 Disorder of male genital organs, unspecified: Secondary | ICD-10-CM | POA: Insufficient documentation

## 2011-04-28 DIAGNOSIS — E785 Hyperlipidemia, unspecified: Secondary | ICD-10-CM | POA: Insufficient documentation

## 2011-04-28 DIAGNOSIS — R609 Edema, unspecified: Secondary | ICD-10-CM | POA: Insufficient documentation

## 2011-04-28 DIAGNOSIS — I2510 Atherosclerotic heart disease of native coronary artery without angina pectoris: Secondary | ICD-10-CM | POA: Insufficient documentation

## 2011-04-28 DIAGNOSIS — K219 Gastro-esophageal reflux disease without esophagitis: Secondary | ICD-10-CM | POA: Insufficient documentation

## 2011-04-28 DIAGNOSIS — E8809 Other disorders of plasma-protein metabolism, not elsewhere classified: Secondary | ICD-10-CM

## 2011-04-28 LAB — CBC
MCH: 18.2 pg — ABNORMAL LOW (ref 26.0–34.0)
MCHC: 27.6 g/dL — ABNORMAL LOW (ref 30.0–36.0)
MCV: 66.2 fL — ABNORMAL LOW (ref 78.0–100.0)
Platelets: 241 10*3/uL (ref 150–400)
RDW: 22.8 % — ABNORMAL HIGH (ref 11.5–15.5)
WBC: 4.1 10*3/uL (ref 4.0–10.5)

## 2011-04-28 LAB — GLUCOSE, CAPILLARY: Glucose-Capillary: 65 mg/dL — ABNORMAL LOW (ref 70–99)

## 2011-04-28 LAB — COMPREHENSIVE METABOLIC PANEL
AST: 28 U/L (ref 0–37)
Albumin: 2.6 g/dL — ABNORMAL LOW (ref 3.5–5.2)
Calcium: 8.9 mg/dL (ref 8.4–10.5)
Creatinine, Ser: 1.22 mg/dL (ref 0.50–1.35)

## 2011-04-28 LAB — PROTIME-INR: INR: 1.52 — ABNORMAL HIGH (ref 0.00–1.49)

## 2011-04-28 MED ORDER — FUROSEMIDE 20 MG PO TABS
40.0000 mg | ORAL_TABLET | Freq: Once | ORAL | Status: AC
  Administered 2011-04-28: 40 mg via ORAL
  Filled 2011-04-28 (×2): qty 1

## 2011-04-28 MED ORDER — POTASSIUM CHLORIDE CRYS ER 20 MEQ PO TBCR
40.0000 meq | EXTENDED_RELEASE_TABLET | Freq: Once | ORAL | Status: AC
  Administered 2011-04-28: 40 meq via ORAL
  Filled 2011-04-28: qty 2
  Filled 2011-04-28: qty 1

## 2011-04-28 MED ORDER — SODIUM CHLORIDE 0.9 % IV SOLN: INTRAVENOUS | Status: DC

## 2011-04-28 NOTE — ED Notes (Signed)
Swollen penis that started yesterday states  No d/c he states had 1 loose bm today

## 2011-04-28 NOTE — ED Provider Notes (Addendum)
History     CSN: [id number] Arrival date & time: 04/28/2011 12:18 PM   First MD Initiated Contact with Patient 04/28/11 1408      Chief Complaint  Patient presents with  . Penis Pain    (Consider location/radiation/quality/duration/timing/severity/associated sxs/prior treatment) The history is provided by the patient.  pt c/o swelling to penis/scrotal area in past few days. Denies hx same. Denies pain to area. No dysuria. No redness to skin or drainage from penis. States has longstanding hx severe edema to bil legs up to mid to upper thigh at baseline. Now swelling appears to extend higher. Denies sob or orthopnea or pnd. States is compliant w meds in general although occasionally misses dose. Notes after taking his lasix yesterday, the penis swelling is improved, although not resolved. Notes normal amt urine output. Denies sob or orthopnea/pnd. No chest pain. No fever or chills.   Past Medical History  Diagnosis Date  . Venous thromboembolism   . 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 Chronic venous insufficiency   . DM2 (diabetes mellitus, type 2)   . GERD (gastroesophageal reflux disease)   . BPH (benign prostatic hypertrophy)   . Low back pain   . HTN (hypertension)   . HLD (hyperlipidemia)   . NICM (nonischemic cardiomyopathy) 1. 9/11  2. 2/12    1. Echo septal and apical akinesis, dilated LV, EF 45%, RV normal size and systolic function, EF 43% on Myoview  2. Admitted with decompensated CHF, echo EF 30-35% with diffuse hypokinesis but akinesis of mid to apical anteroseptal wall and apex, moderate LVH, mild MR, grade I diastolic dysfunction, SPEP/UP negative and HIV negative  . Smoking   . CAD (coronary artery disease) 1. 2007  2. 10/11    1. Left heart cath with 40-50% stenosis in small RCA EF 50%  2. Lexiscan myoview EF 43%, global hypokinesis, possible small area of apical ischemia; LHC no angiographic CAD, no LV-gram done due to CKD      Past  Surgical History  Procedure Date  . Cardiac catheterization   . Appendectomy   . Cholecystectomy     Family History  Problem Relation Age of Onset  . Coronary artery disease Neg Hx     Premature    History  Substance Use Topics  . Smoking status: Current Everyday Smoker -- 1.0 packs/day  . Smokeless tobacco: Not on file  . Alcohol Use: No      Review of Systems  Constitutional: Negative for fever.  HENT: Negative for neck pain.   Eyes: Negative for redness.  Respiratory: Negative for shortness of breath.   Cardiovascular: Negative for chest pain.  Gastrointestinal: Negative for abdominal pain.  Genitourinary: Negative for dysuria, flank pain and discharge.  Musculoskeletal: Negative for back pain.  Skin: Negative for rash.  Neurological: Negative for headaches.  Hematological: Does not bruise/bleed easily.  Psychiatric/Behavioral: Negative for confusion.    Allergies  Review of patient's allergies indicates no known allergies.  Home Medications   Current Outpatient Rx  Name Route Sig Dispense Refill  . ATORVASTATIN CALCIUM 20 MG PO TABS Oral Take 20 mg by mouth daily.      . FUROSEMIDE 40 MG PO TABS Oral Take 80 mg by mouth daily.     Marland Kitchen GLIPIZIDE ER 5 MG PO TB24 Oral Take 5 mg by mouth daily.      . INSULIN GLARGINE 100 UNIT/ML [place] SOLN Subcutaneous Inject 20 Units into the skin  at bedtime.      Marland Kitchen LANSOPRAZOLE 30 MG PO CPDR Oral Take 30 mg by mouth daily.      Marland Kitchen LOSARTAN POTASSIUM 100 MG PO TABS Oral Take 1 tablet (100 mg total) by mouth daily. 30 tablet 11  . METOPROLOL SUCCINATE ER 100 MG PO TB24 Oral Take 100 mg by mouth daily.     . OXYCODONE-ACETAMINOPHEN 10-325 MG PO TABS Oral Take 1-2 tablets by mouth every 4 (four) hours as needed. For pain       BP 135/97  Pulse 103  Temp(Src) 97.9 F (36.6 C) (Oral)  Resp 16  SpO2 95%  Physical Exam  Nursing note and vitals reviewed. Constitutional: He is oriented to person, place, and time. He appears  well-developed and well-nourished. No distress.  HENT:  Head: Atraumatic.  Eyes: Pupils are equal, round, and reactive to light.  Neck: Neck supple. No tracheal deviation present.  Cardiovascular: Normal rate, regular rhythm, normal heart sounds and intact distal pulses.   Pulmonary/Chest: Effort normal and breath sounds normal. No accessory muscle usage. No respiratory distress. He has no rales.  Abdominal: Soft. Bowel sounds are normal. There is no tenderness.  Musculoskeletal: Normal range of motion. He exhibits edema. He exhibits no tenderness.       Edema bil legs, symmetric, up to abd. Penis>scrotal edema. No erythema. No phimosis or paraphimosis. No tenderness to penis.   Neurological: He is alert and oriented to person, place, and time.  Skin: Skin is warm and dry.  Psychiatric: He has a normal mood and affect.    ED Course  Procedures (including critical care time)  Results for orders placed during the hospital encounter of 04/28/11  CBC      Component Value Range   WBC 4.1  4.0 - 10.5 (K/uL)   RBC 4.88  4.22 - 5.81 (MIL/uL)   Hemoglobin 8.9 (*) 13.0 - 17.0 (g/dL)   HCT 16.1 (*) 09.6 - 52.0 (%)   MCV 66.2 (*) 78.0 - 100.0 (fL)   MCH 18.2 (*) 26.0 - 34.0 (pg)   MCHC 27.6 (*) 30.0 - 36.0 (g/dL)   RDW 04.5 (*) 40.9 - 15.5 (%)   Platelets 241  150 - 400 (K/uL)  COMPREHENSIVE METABOLIC PANEL      Component Value Range   Sodium 139  135 - 145 (mEq/L)   Potassium 3.4 (*) 3.5 - 5.1 (mEq/L)   Chloride 106  96 - 112 (mEq/L)   CO2 25  19 - 32 (mEq/L)   Glucose, Bld 49 (*) 70 - 99 (mg/dL)   BUN 11  6 - 23 (mg/dL)   Creatinine, Ser 8.11  0.50 - 1.35 (mg/dL)   Calcium 8.9  8.4 - 91.4 (mg/dL)   Total Protein 7.5  6.0 - 8.3 (g/dL)   Albumin 2.6 (*) 3.5 - 5.2 (g/dL)   AST 28  0 - 37 (U/L)   ALT 20  0 - 53 (U/L)   Alkaline Phosphatase 296 (*) 39 - 117 (U/L)   Total Bilirubin 2.1 (*) 0.3 - 1.2 (mg/dL)   GFR calc non Af Amer 61 (*) >90 (mL/min)   GFR calc Af Amer 71 (*) >90  (mL/min)  PROTIME-INR      Component Value Range   Prothrombin Time 18.6 (*) 11.6 - 15.2 (seconds)   INR 1.52 (*) 0.00 - 1.49        MDM  Labs sent.   hct c/w baseline. Renal fxn at baseline. Bili c/w prior. Albumin low.  Given bil leg/scrot/test edema, additional dose lasix given. kcl po given.  Gluc low, pt remains conscious and alert, states  hasnt eaten since early am, given meal tray/po fluids.   pcp betty reese - pt to follow up there closely in next couple days.    Pt requests d/c. bs improved from prior.    Suzi Roots, MD 04/28/11 1648  Suzi Roots, MD 04/28/11 [phone]

## 2011-04-28 NOTE — ED Notes (Signed)
Checked with [employer] on pt's meal tray, was informed the meal was in "process, being made" and should be to the pt shortly

## 2011-04-28 NOTE — ED Notes (Signed)
[place] sandwich and diet coke with a cup of ice given to pt

## 2011-04-28 NOTE — ED Notes (Signed)
CBG resulted 65; Steinl, MD notified

## 2011-04-28 NOTE — ED Notes (Signed)
Pt aware Dr. Denton Lank wants his glucose levels to come up more before discharging him home; ordered pt a diabetic tray and will recheck pt's glucose levels shortly after

## 2011-04-29 ENCOUNTER — Inpatient Hospital Stay (HOSPITAL_COMMUNITY)
Admission: EM | Admit: 2011-04-29 | Discharge: 2011-05-02 | DRG: 377 | Disposition: A | Discharge status: Home / Self Care | Payer: PRIVATE HEALTH INSURANCE | Source: Ambulatory Visit | Attending: Internal Medicine | Admitting: Internal Medicine

## 2011-04-29 ENCOUNTER — Encounter (HOSPITAL_COMMUNITY): Payer: Self-pay | Admitting: *Deleted

## 2011-04-29 DIAGNOSIS — I959 Hypotension, unspecified: Secondary | ICD-10-CM | POA: Diagnosis present

## 2011-04-29 DIAGNOSIS — R000 Tachycardia, unspecified: Secondary | ICD-10-CM

## 2011-04-29 DIAGNOSIS — I509 Heart failure, unspecified: Secondary | ICD-10-CM | POA: Diagnosis present

## 2011-04-29 DIAGNOSIS — I2510 Atherosclerotic heart disease of native coronary artery without angina pectoris: Secondary | ICD-10-CM

## 2011-04-29 DIAGNOSIS — K7460 Unspecified cirrhosis of liver: Secondary | ICD-10-CM | POA: Diagnosis present

## 2011-04-29 DIAGNOSIS — Z9119 Patient's noncompliance with other medical treatment and regimen: Secondary | ICD-10-CM

## 2011-04-29 DIAGNOSIS — R9389 Abnormal findings on diagnostic imaging of other specified body structures: Secondary | ICD-10-CM

## 2011-04-29 DIAGNOSIS — E785 Hyperlipidemia, unspecified: Secondary | ICD-10-CM

## 2011-04-29 DIAGNOSIS — Z86718 Personal history of other venous thrombosis and embolism: Secondary | ICD-10-CM

## 2011-04-29 DIAGNOSIS — N182 Chronic kidney disease, stage 2 (mild): Secondary | ICD-10-CM | POA: Diagnosis present

## 2011-04-29 DIAGNOSIS — E119 Type 2 diabetes mellitus without complications: Secondary | ICD-10-CM | POA: Diagnosis present

## 2011-04-29 DIAGNOSIS — D62 Acute posthemorrhagic anemia: Secondary | ICD-10-CM | POA: Diagnosis present

## 2011-04-29 DIAGNOSIS — I5043 Acute on chronic combined systolic (congestive) and diastolic (congestive) heart failure: Secondary | ICD-10-CM | POA: Diagnosis present

## 2011-04-29 DIAGNOSIS — I10 Essential (primary) hypertension: Secondary | ICD-10-CM | POA: Diagnosis present

## 2011-04-29 DIAGNOSIS — Z91199 Patient's noncompliance with other medical treatment and regimen due to unspecified reason: Secondary | ICD-10-CM

## 2011-04-29 DIAGNOSIS — K625 Hemorrhage of anus and rectum: Secondary | ICD-10-CM

## 2011-04-29 DIAGNOSIS — I428 Other cardiomyopathies: Secondary | ICD-10-CM | POA: Diagnosis present

## 2011-04-29 DIAGNOSIS — K922 Gastrointestinal hemorrhage, unspecified: Principal | ICD-10-CM | POA: Diagnosis present

## 2011-04-29 DIAGNOSIS — Z794 Long term (current) use of insulin: Secondary | ICD-10-CM

## 2011-04-29 DIAGNOSIS — I129 Hypertensive chronic kidney disease with stage 1 through stage 4 chronic kidney disease, or unspecified chronic kidney disease: Secondary | ICD-10-CM | POA: Diagnosis present

## 2011-04-29 DIAGNOSIS — K31819 Angiodysplasia of stomach and duodenum without bleeding: Secondary | ICD-10-CM

## 2011-04-29 DIAGNOSIS — R609 Edema, unspecified: Secondary | ICD-10-CM

## 2011-04-29 DIAGNOSIS — I82409 Acute embolism and thrombosis of unspecified deep veins of unspecified lower extremity: Secondary | ICD-10-CM | POA: Diagnosis present

## 2011-04-29 DIAGNOSIS — I5022 Chronic systolic (congestive) heart failure: Secondary | ICD-10-CM

## 2011-04-29 DIAGNOSIS — N189 Chronic kidney disease, unspecified: Secondary | ICD-10-CM | POA: Diagnosis present

## 2011-04-29 DIAGNOSIS — R0602 Shortness of breath: Secondary | ICD-10-CM

## 2011-04-29 DIAGNOSIS — Z7982 Long term (current) use of aspirin: Secondary | ICD-10-CM

## 2011-04-29 DIAGNOSIS — F17200 Nicotine dependence, unspecified, uncomplicated: Secondary | ICD-10-CM

## 2011-04-29 HISTORY — DX: Anemia, unspecified: D64.9

## 2011-04-29 HISTORY — DX: Encounter for other specified aftercare: Z51.89

## 2011-04-29 HISTORY — DX: Reserved for inherently not codable concepts without codable children: IMO0001

## 2011-04-29 HISTORY — DX: Headache: R51

## 2011-04-29 HISTORY — DX: Chronic kidney disease, stage 2 (mild): N18.2

## 2011-04-29 LAB — CBC
HCT: 31.7 % — ABNORMAL LOW (ref 39.0–52.0)
MCH: 18.3 pg — ABNORMAL LOW (ref 26.0–34.0)
MCHC: 27.8 g/dL — ABNORMAL LOW (ref 30.0–36.0)
RDW: 22.9 % — ABNORMAL HIGH (ref 11.5–15.5)

## 2011-04-29 LAB — BASIC METABOLIC PANEL
Chloride: 104 mEq/L (ref 96–112)
Creatinine, Ser: 1.32 mg/dL (ref 0.50–1.35)
GFR calc Af Amer: 65 mL/min — ABNORMAL LOW (ref >90)
GFR calc non Af Amer: 56 mL/min — ABNORMAL LOW (ref >90)

## 2011-04-29 LAB — DIFFERENTIAL
Basophils Absolute: 0 10*3/uL (ref 0.0–0.1)
Basophils Relative: 1 % (ref 0–1)
Eosinophils Absolute: 0.1 10*3/uL (ref 0.0–0.7)
Monocytes Absolute: 0.8 10*3/uL (ref 0.1–1.0)
Neutro Abs: 2 10*3/uL (ref 1.7–7.7)
Neutrophils Relative %: 52 % (ref 43–77)

## 2011-04-29 LAB — PREPARE RBC (CROSSMATCH)

## 2011-04-29 LAB — GLUCOSE, CAPILLARY: Glucose-Capillary: 144 mg/dL — ABNORMAL HIGH (ref 70–99)

## 2011-04-29 MED ORDER — PANTOPRAZOLE SODIUM 40 MG IV SOLR
40.0000 mg | Freq: Once | INTRAVENOUS | Status: AC
  Administered 2011-04-29: 40 mg via INTRAVENOUS
  Filled 2011-04-29: qty 40

## 2011-04-29 MED ORDER — SODIUM CHLORIDE 0.9 % IV BOLUS (SEPSIS)
1000.0000 mL | Freq: Once | INTRAVENOUS | Status: AC
  Administered 2011-04-30: 1000 mL via INTRAVENOUS

## 2011-04-29 NOTE — H&P (Signed)
[name] is an 65 y.o. male.   PCP - Dr.Betty Pecola Leisure at [facility]. Chief Complaint: Rectal bleeding. 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 Around 4 PM patient was eating and felt something warm his legs and he went to the bathroom he had frank rectal bleed. He felt dizzy but did not lose consciousness he was brought to the ER. In the ER patient was found tachycardic and also his hemoglobin was around 8. Patient is already started on blood transfusion one unit. Patient will be admitted for further workup. Patient states he has had EGD and colonoscopy 2 months ago results of which he is not sure of. Patient denies any nausea vomiting abdominal pain or diarrhea. He was in the ER yesterday for increasing swelling of his lower extremity.  Past Medical History  Diagnosis Date  . Venous thromboembolism   . 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 Chronic venous insufficiency   . DM2 (diabetes mellitus, type 2)   . GERD (gastroesophageal reflux disease)   . BPH (benign prostatic hypertrophy)   . Low back pain   . HTN (hypertension)   . HLD (hyperlipidemia)   . NICM (nonischemic cardiomyopathy) 1. 9/11  2. 2/12    1. Echo septal and apical akinesis, dilated LV, EF 45%, RV normal size and systolic function, EF 43% on Myoview  2. Admitted with decompensated CHF, echo EF 30-35% with diffuse hypokinesis but akinesis of mid to apical anteroseptal wall and apex, moderate LVH, mild MR, grade I diastolic dysfunction, SPEP/UP negative and HIV negative  . Smoking   . CAD (coronary artery disease) 1. 2007  2. 10/11    1. Left heart cath with 40-50% stenosis in small RCA EF 50%  2. Lexiscan  myoview EF 43%, global hypokinesis, possible small area of apical ischemia; LHC no angiographic CAD, no LV-gram done due to CKD      Past Surgical History  Procedure Date  . Cardiac catheterization   . Appendectomy   . Cholecystectomy     Family History  Problem Relation Age of Onset  . Coronary artery disease Neg Hx     Premature   Social History:  reports that he has been smoking.  He does not have any smokeless tobacco history on file. He reports that he does not drink alcohol. His drug history not on file.  Allergies: No Known Allergies  Medications Prior to Admission  Medication Dose Route Frequency Provider Last Rate Last Dose  . furosemide (LASIX) tablet 40 mg  40 mg Oral Once Suzi Roots, MD   40 mg at 04/28/11 1654  . pantoprazole (PROTONIX) injection 40 mg  40 mg Intravenous Once Nicholes Stairs, MD   40 mg at 04/29/11 2136  . potassium chloride SA (K-DUR,KLOR-CON) CR tablet 40 mEq  40 mEq Oral Once Suzi Roots, MD   40 mEq at 04/28/11 1654  . sodium chloride 0.9 % bolus 1,000 mL  1,000 mL Intravenous Once Nicholes Stairs, MD      . DISCONTD: 0.9 %  sodium chloride infusion   Intravenous Continuous Suzi Roots, MD       Medications Prior to Admission  Medication  Sig Dispense Refill  . atorvastatin (LIPITOR) 20 MG tablet Take 20 mg by mouth daily.        . furosemide (LASIX) 40 MG tablet Take 80 mg by mouth daily.       Marland Kitchen glipiZIDE (GLUCOTROL XL) 5 MG 24 hr tablet Take 5 mg by mouth daily.        . insulin glargine (LANTUS) 100 UNIT/ML injection Inject 20 Units into the skin at bedtime.        . lansoprazole (PREVACID) 30 MG capsule Take 30 mg by mouth daily.        Marland Kitchen losartan (COZAAR) 100 MG tablet Take 1 tablet (100 mg total) by mouth daily.  30 tablet  11  . metoprolol (TOPROL-XL) 100 MG 24 hr tablet Take 100 mg by mouth daily.       Marland Kitchen oxyCODONE-acetaminophen (PERCOCET) 10-325 MG per tablet Take 1-2 tablets by mouth every 4 (four) hours as needed. For  pain         Results for orders placed during the hospital encounter of 04/29/11 (from the past 48 hour(s))  CBC     Status: Abnormal   Collection Time   04/29/11  6:56 PM      Component Value Range Comment   WBC 3.9 (*) 4.0 - 10.5 (K/uL)    RBC 4.80  4.22 - 5.81 (MIL/uL)    Hemoglobin 8.8 (*) 13.0 - 17.0 (g/dL)    HCT 91.4 (*) 78.2 - 52.0 (%)    MCV 66.0 (*) 78.0 - 100.0 (fL)    MCH 18.3 (*) 26.0 - 34.0 (pg)    MCHC 27.8 (*) 30.0 - 36.0 (g/dL)    RDW 95.6 (*) 21.3 - 15.5 (%)    Platelets 237  150 - 400 (K/uL)   DIFFERENTIAL     Status: Abnormal   Collection Time   04/29/11  6:56 PM      Component Value Range Comment   Neutrophils Relative 52  43 - 77 (%)    Neutro Abs 2.0  1.7 - 7.7 (K/uL)    Lymphocytes Relative 25  12 - 46 (%)    Lymphs Abs 1.0  0.7 - 4.0 (K/uL)    Monocytes Relative 20 (*) 3 - 12 (%)    Monocytes Absolute 0.8  0.1 - 1.0 (K/uL)    Eosinophils Relative 2  0 - 5 (%)    Eosinophils Absolute 0.1  0.0 - 0.7 (K/uL)    Basophils Relative 1  0 - 1 (%)    Basophils Absolute 0.0  0.0 - 0.1 (K/uL)   BASIC METABOLIC PANEL     Status: Abnormal   Collection Time   04/29/11  6:56 PM      Component Value Range Comment   Sodium 138  135 - 145 (mEq/L)    Potassium 3.7  3.5 - 5.1 (mEq/L)    Chloride 104  96 - 112 (mEq/L)    CO2 23  19 - 32 (mEq/L)    Glucose, Bld 55 (*) 70 - 99 (mg/dL)    BUN 14  6 - 23 (mg/dL)    Creatinine, Ser 0.86  0.50 - 1.35 (mg/dL)    Calcium 8.9  8.4 - 10.5 (mg/dL)    GFR calc non Af Amer 56 (*) >90 (mL/min)    GFR calc Af Amer 65 (*) >90 (mL/min)   OCCULT BLOOD, POC DEVICE     Status: Normal   Collection Time   04/29/11  8:51 PM  Component Value Range Comment   Fecal Occult Bld POSITIVE     PREPARE RBC (CROSSMATCH)     Status: Normal   Collection Time   04/29/11  8:57 PM      Component Value Range Comment   Order Confirmation ORDER PROCESSED BY BLOOD BANK     TYPE AND SCREEN     Status: Normal (Preliminary result)   Collection  Time   04/29/11  8:57 PM      Component Value Range Comment   ABO/RH(D) B POS      Antibody Screen NEG      Sample Expiration 05/02/2011      Unit Number [id number]      Blood Component Type RED CELLS,LR      Unit division 00      Status of Unit ISSUED      Transfusion Status OK TO TRANSFUSE      Crossmatch Result Compatible      No results found.  Review of Systems  Constitutional: Negative.   HENT: Negative.   Eyes: Negative.   Respiratory: Negative.   Cardiovascular: Negative.   Gastrointestinal: Positive for blood in stool.  Genitourinary: Negative.   Musculoskeletal: Negative.   Skin: Negative.   Neurological: Positive for dizziness.  Endo/Heme/Allergies: Negative.   Psychiatric/Behavioral: Negative.     Blood pressure 116/83, pulse 116, temperature 98.5 F (36.9 C), temperature source Oral, resp. rate 19, SpO2 100.00%. Physical Exam  Constitutional: He is oriented to person, place, and time. He appears well-developed and well-nourished. No distress.  HENT:  Head: Normocephalic and atraumatic.  Right Ear: External ear normal.  Left Ear: External ear normal.  Nose: Nose normal.  Mouth/Throat: Oropharynx is clear and moist.  Eyes: No scleral icterus.       Pallor  Cardiovascular:       Sinus tachycardia.  Respiratory: Effort normal and breath sounds normal. No respiratory distress. He has no wheezes.  GI: Soft. Bowel sounds are normal. He exhibits no distension. There is no tenderness.  Musculoskeletal: Normal range of motion. He exhibits no edema and no tenderness.  Neurological: He is alert and oriented to person, place, and time. He has normal reflexes. No cranial nerve deficit.  Skin: He is not diaphoretic. There is pallor.     Assessment/Plan #1. Acute GI bleed - patient does have a history of AV malformation in duodenum and also recent colonoscopy showing some polyps. Not sure if he is having acute upper or lower GI bleed for now we'll keep patient n.p.o.,  on Protonix infusion, type and cross match 4 more units of packed red blood cells, check INR. We will check CBC every 6 hours and transfuse as needed. At this time I have called gastroenterologist on call Dr. Russella Dar, who is on call for [facility]. We will follow his recommendations. #2. Acute blood loss anemia - from #1 reason. #3. History of nonischemic cardiomyopathy - patient is not short of breath but his lower extremities has tense edema. Presently we will hold off his Lasix but once patient is more stable we will try to restart Lasix as soon as possible. #4. Diabetes mellitus type 2 -  Patient will be n.p.o. so we'll check CBG every 4 and place on sliding scale coverage. #5. 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  CODE STATUS - full code.  Eduard Clos. 04/29/2011, 10:59 PM

## 2011-04-29 NOTE — ED Notes (Signed)
Pt was seen here last pm and was sent home on lasix for penile edema.  Today was sitting and area opened and started bleeding and gushed down his leg.  Bleeding controlled currently.

## 2011-04-29 NOTE — ED Notes (Signed)
Contacted IV team in regards to starting IV.

## 2011-04-29 NOTE — ED Notes (Signed)
Pt reports at home sitting down and when he got up blood started "shooting out" from around the penile area.  Pt states it was "a whole bunch" of blood.

## 2011-04-29 NOTE — ED Notes (Signed)
Pt resting in room quietly, denies any needs at this time.

## 2011-04-29 NOTE — ED Provider Notes (Signed)
History     CSN: [id number] Arrival date & time: 04/29/2011  5:09 PM   First MD Initiated Contact with Patient 04/29/11 1737      Chief Complaint  Patient presents with  . Bleeding/Bruising    suprapubic skin tear    (Consider location/radiation/quality/duration/timing/severity/associated sxs/prior treatment) The history is provided by the patient and medical records.   the patient complains of a warm feeling going down his leg after he returned home from a restaurant.  He looked and there was a large amount of blood.  He was not sure where it was coming from so he called EMS.  EMS brought him over here for evaluation.  He states that he is mildly lightheaded, and short of breath.  He denies any pain.  He denies nausea, vomiting, fevers, chills, diarrhea.  He has not noted blood in his stool.  He is not taking any blood thinners.  Past Medical History  Diagnosis Date  . Venous thromboembolism   . 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 Chronic venous insufficiency   . DM2 (diabetes mellitus, type 2)   . GERD (gastroesophageal reflux disease)   . BPH (benign prostatic hypertrophy)   . Low back pain   . HTN (hypertension)   . HLD (hyperlipidemia)   . NICM (nonischemic cardiomyopathy) 1. 9/11  2. 2/12    1. Echo septal and apical akinesis, dilated LV, EF 45%, RV normal size and systolic function, EF 43% on Myoview  2. Admitted with decompensated CHF, echo EF 30-35% with diffuse hypokinesis but akinesis of mid to apical anteroseptal wall and apex, moderate LVH, mild MR, grade I diastolic dysfunction, SPEP/UP negative and HIV negative  . Smoking   . CAD (coronary artery disease) 1. 2007  2. 10/11    1. Left heart cath with 40-50% stenosis in small RCA EF 50%  2. Lexiscan myoview EF 43%, global hypokinesis, possible small area of apical ischemia; LHC no angiographic CAD, no LV-gram done due to CKD      Past Surgical History  Procedure Date  . Cardiac  catheterization   . Appendectomy   . Cholecystectomy     Family History  Problem Relation Age of Onset  . Coronary artery disease Neg Hx     Premature    History  Substance Use Topics  . Smoking status: Current Everyday Smoker -- 1.0 packs/day  . Smokeless tobacco: Not on file  . Alcohol Use: No      Review of Systems  Constitutional: Negative for fever and chills.  HENT: Negative for nosebleeds.   Eyes: Negative for redness.  Respiratory: Positive for shortness of breath. Negative for cough.   Cardiovascular: Negative for chest pain and palpitations.  Gastrointestinal: Negative for nausea, vomiting, abdominal pain and diarrhea.  Genitourinary: Negative for dysuria and hematuria.  Musculoskeletal: Negative for back pain.  Neurological: Positive for light-headedness. Negative for headaches.  Hematological: Does not bruise/bleed easily.  Psychiatric/Behavioral: Negative for confusion.    Allergies  Review of patient's allergies indicates no known allergies.  Home Medications   Current Outpatient Rx  Name Route Sig Dispense Refill  . ATORVASTATIN CALCIUM 20 MG PO TABS Oral Take 20 mg by mouth daily.      . FUROSEMIDE 40 MG PO TABS Oral Take 80 mg by mouth daily.     Marland Kitchen GLIPIZIDE ER 5 MG PO TB24 Oral Take 5 mg by mouth daily.      . INSULIN GLARGINE  100 UNIT/ML [place] SOLN Subcutaneous Inject 20 Units into the skin at bedtime.      Marland Kitchen LANSOPRAZOLE 30 MG PO CPDR Oral Take 30 mg by mouth daily.      Marland Kitchen LOSARTAN POTASSIUM 100 MG PO TABS Oral Take 1 tablet (100 mg total) by mouth daily. 30 tablet 11  . METOPROLOL SUCCINATE ER 100 MG PO TB24 Oral Take 100 mg by mouth daily.     . OXYCODONE-ACETAMINOPHEN 10-325 MG PO TABS Oral Take 1-2 tablets by mouth every 4 (four) hours as needed. For pain       BP 134/89  Pulse 113  Temp(Src) 97.3 F (36.3 C) (Oral)  Resp 27  SpO2 100%  Physical Exam  Constitutional: He is oriented to person, place, and time. He appears well-developed  and well-nourished.  HENT:  Head: Normocephalic and atraumatic.  Eyes: EOM are normal. Pupils are equal, round, and reactive to light.       Conjunctiva pale  Neck: Normal range of motion. Neck supple.  Cardiovascular: Regular rhythm and normal heart sounds.   No murmur heard.      Tachycardia  Pulmonary/Chest: Effort normal and breath sounds normal. No respiratory distress. He has no wheezes. He has no rales.  Abdominal: Soft. Bowel sounds are normal. He exhibits no distension and no mass. There is no tenderness. There is no rebound and no guarding.  Genitourinary: Guaiac positive stool.  Musculoskeletal: Normal range of motion. He exhibits no edema and no tenderness.  Neurological: He is alert and oriented to person, place, and time. No cranial nerve deficit.  Skin: Skin is warm and dry. He is not diaphoretic. There is pallor.  Psychiatric: He has a normal mood and affect. His behavior is normal.    ED Course  Procedures (including critical care time) 65 year old, male, with tachycardia, mild shortness of breath, and lightheadedness.  Presents with bleeding from his bottom.  He denies pain anywhere.  We will perform laboratory testing, and transfuse him since she is symptomatic.  Labs Reviewed  CBC - Abnormal; Notable for the following:    WBC 3.9 (*)    Hemoglobin 8.8 (*)    HCT 31.7 (*)    MCV 66.0 (*)    MCH 18.3 (*)    MCHC 27.8 (*)    RDW 22.9 (*)    All other components within normal limits  DIFFERENTIAL - Abnormal; Notable for the following:    Monocytes Relative 20 (*)    All other components within normal limits  BASIC METABOLIC PANEL - Abnormal; Notable for the following:    Glucose, Bld 55 (*)    GFR calc non Af Amer 56 (*)    GFR calc Af Amer 65 (*)    All other components within normal limits  OCCULT BLOOD, POC DEVICE  PREPARE RBC (CROSSMATCH)  POCT OCCULT BLOOD STOOL, DEVICE  TYPE AND SCREEN    CRITICAL CARE Performed by: Nicholes Stairs   Total  critical care time: 30 min  Critical care time was exclusive of separately billable procedures and treating other patients.  Critical care was necessary to treat or prevent imminent or life-threatening deterioration.  Critical care was time spent personally by me on the following activities: development of treatment plan with patient and/or surrogate as well as nursing, discussions with consultants, evaluation of patient's response to treatment, examination of patient, obtaining history from patient or surrogate, ordering and performing treatments and interventions, ordering and review of laboratory studies, ordering and review of radiographic  studies, pulse oximetry and re-evaluation of patient's condition.  Discussed with triad. He will admit.  MDM  GI bleed, with tachycardia, and lightheadedness.        Nicholes Stairs, MD 04/29/11 2227

## 2011-04-30 ENCOUNTER — Emergency Department (HOSPITAL_COMMUNITY): Payer: PRIVATE HEALTH INSURANCE

## 2011-04-30 ENCOUNTER — Encounter (HOSPITAL_COMMUNITY): Payer: Self-pay | Admitting: Physician Assistant

## 2011-04-30 DIAGNOSIS — R945 Abnormal results of liver function studies: Secondary | ICD-10-CM

## 2011-04-30 DIAGNOSIS — I959 Hypotension, unspecified: Secondary | ICD-10-CM | POA: Diagnosis present

## 2011-04-30 DIAGNOSIS — K31819 Angiodysplasia of stomach and duodenum without bleeding: Secondary | ICD-10-CM | POA: Insufficient documentation

## 2011-04-30 DIAGNOSIS — N189 Chronic kidney disease, unspecified: Secondary | ICD-10-CM | POA: Diagnosis present

## 2011-04-30 DIAGNOSIS — K921 Melena: Secondary | ICD-10-CM

## 2011-04-30 DIAGNOSIS — D509 Iron deficiency anemia, unspecified: Secondary | ICD-10-CM

## 2011-04-30 DIAGNOSIS — E119 Type 2 diabetes mellitus without complications: Secondary | ICD-10-CM | POA: Diagnosis present

## 2011-04-30 DIAGNOSIS — I428 Other cardiomyopathies: Secondary | ICD-10-CM | POA: Insufficient documentation

## 2011-04-30 LAB — MRSA PCR SCREENING: MRSA by PCR: NEGATIVE

## 2011-04-30 LAB — GLUCOSE, CAPILLARY
Glucose-Capillary: 66 mg/dL — ABNORMAL LOW (ref 70–99)
Glucose-Capillary: 117 mg/dL — ABNORMAL HIGH (ref 70–99)
Glucose-Capillary: 157 mg/dL — ABNORMAL HIGH (ref 70–99)

## 2011-04-30 LAB — CBC
HCT: 35.2 % — ABNORMAL LOW (ref 39.0–52.0)
Hemoglobin: 10.1 g/dL — ABNORMAL LOW (ref 13.0–17.0)
MCH: 19.3 pg — ABNORMAL LOW (ref 26.0–34.0)
MCHC: 28.7 g/dL — ABNORMAL LOW (ref 30.0–36.0)
MCV: 67.3 fL — ABNORMAL LOW (ref 78.0–100.0)
Platelets: 234 10*3/uL (ref 150–400)
RBC: 5.23 MIL/uL (ref 4.22–5.81)
RDW: 23.2 % — ABNORMAL HIGH (ref 11.5–15.5)
WBC: 4.3 10*3/uL (ref 4.0–10.5)

## 2011-04-30 LAB — PREPARE RBC (CROSSMATCH)

## 2011-04-30 LAB — COMPREHENSIVE METABOLIC PANEL
Alkaline Phosphatase: 335 U/L — ABNORMAL HIGH (ref 39–117)
BUN: 14 mg/dL (ref 6–23)
Chloride: 103 mEq/L (ref 96–112)
GFR calc Af Amer: 64 mL/min — ABNORMAL LOW (ref >90)
Glucose, Bld: 126 mg/dL — ABNORMAL HIGH (ref 70–99)
Potassium: 3.8 mEq/L (ref 3.5–5.1)
Total Bilirubin: 3.3 mg/dL — ABNORMAL HIGH (ref 0.3–1.2)

## 2011-04-30 LAB — PROTIME-INR
INR: 1.58 — ABNORMAL HIGH (ref 0.00–1.49)
Prothrombin Time: 19.2 seconds — ABNORMAL HIGH (ref 11.6–15.2)

## 2011-04-30 MED ORDER — DEXTROSE 50 % IV SOLN
50.0000 mL | INTRAVENOUS | Status: AC
  Administered 2011-04-30: 50 mL via INTRAVENOUS

## 2011-04-30 MED ORDER — HYDROMORPHONE HCL PF 2 MG/ML IJ SOLN
INTRAMUSCULAR | Status: AC
  Filled 2011-04-30: qty 1

## 2011-04-30 MED ORDER — DEXTROSE 50 % IV SOLN
INTRAVENOUS | Status: AC
  Administered 2011-04-30: 50 mL via INTRAVENOUS
  Filled 2011-04-30: qty 50

## 2011-04-30 MED ORDER — IOHEXOL 300 MG/ML  SOLN
80.0000 mL | Freq: Once | INTRAMUSCULAR | Status: AC | PRN
  Administered 2011-04-30: 80 mL via INTRAVENOUS

## 2011-04-30 MED ORDER — METOPROLOL TARTRATE 25 MG PO TABS
25.0000 mg | ORAL_TABLET | Freq: Two times a day (BID) | ORAL | Status: DC
  Administered 2011-04-30 – 2011-05-01 (×3): 25 mg via ORAL
  Filled 2011-04-30 (×6): qty 1

## 2011-04-30 MED ORDER — ACETAMINOPHEN 325 MG PO TABS: 650.0000 mg | ORAL_TABLET | Freq: Four times a day (QID) | ORAL | Status: DC | PRN

## 2011-04-30 MED ORDER — ACETAMINOPHEN 650 MG RE SUPP: 650.0000 mg | Freq: Four times a day (QID) | RECTAL | Status: DC | PRN

## 2011-04-30 MED ORDER — LABETALOL HCL 5 MG/ML IV SOLN
10.0000 mg | INTRAVENOUS | Status: DC | PRN
  Filled 2011-04-30: qty 4

## 2011-04-30 MED ORDER — INSULIN ASPART 100 UNIT/ML ~~LOC~~ SOLN
0.0000 [IU] | Freq: Three times a day (TID) | SUBCUTANEOUS | Status: DC
  Administered 2011-04-30 – 2011-05-02 (×2): 2 [IU] via SUBCUTANEOUS
  Filled 2011-04-30: qty 3
  Filled 2011-04-30: qty 1

## 2011-04-30 MED ORDER — FLEET ENEMA 7-19 GM/118ML RE ENEM
2.0000 | ENEMA | RECTAL | Status: AC
  Administered 2011-05-01: 2 via RECTAL
  Filled 2011-04-30: qty 2

## 2011-04-30 MED ORDER — SODIUM CHLORIDE 0.9 % IJ SOLN
3.0000 mL | Freq: Two times a day (BID) | INTRAMUSCULAR | Status: DC
  Administered 2011-04-30 – 2011-05-01 (×2): 3 mL via INTRAVENOUS

## 2011-04-30 MED ORDER — ONDANSETRON HCL 4 MG/2ML IJ SOLN: 4.0000 mg | Freq: Four times a day (QID) | INTRAMUSCULAR | Status: DC | PRN

## 2011-04-30 MED ORDER — SODIUM CHLORIDE 0.9 % IV SOLN: 8.0000 mg/h | INTRAVENOUS | Status: DC

## 2011-04-30 MED ORDER — ONDANSETRON HCL 4 MG PO TABS: 4.0000 mg | ORAL_TABLET | Freq: Four times a day (QID) | ORAL | Status: DC | PRN

## 2011-04-30 MED ORDER — LOSARTAN POTASSIUM 50 MG PO TABS
50.0000 mg | ORAL_TABLET | Freq: Every day | ORAL | Status: DC
  Administered 2011-04-30 – 2011-05-02 (×2): 50 mg via ORAL
  Filled 2011-04-30 (×3): qty 1

## 2011-04-30 MED ORDER — KCL IN DEXTROSE-NACL 20-5-0.45 MEQ/L-%-% IV SOLN
INTRAVENOUS | Status: DC
  Administered 2011-04-30 (×2): via INTRAVENOUS
  Filled 2011-04-30 (×3): qty 1000

## 2011-04-30 NOTE — ED Notes (Signed)
Edema noted in abdomen. Edema noted in bilateral lower extremities.  Pt denies pain at this time.  Blood glucose check completed.  IV infusion verified to be Normal Saline @ 245ml/hr via Alaris pump.  Admitting team paged and notified of pt's current blood glucose of 66mg /dl for further treatment.  Per MD orders, pt NPO at this time.  Pt with no acute distress at this time.  No needs expressed when assistance offered at this time.

## 2011-04-30 NOTE — ED Notes (Signed)
Patient denies pain and is resting comfortably.  

## 2011-04-30 NOTE — ED Notes (Signed)
CBG is 175. RN Katie notified of result.

## 2011-04-30 NOTE — Consult Note (Signed)
Chart was reviewed and patient was examined. X-rays were reviewed.  Pt has h/o limited hematochezia.  He's no longer bleeding and blood counts are stable.  He has anasarca and cholestatic LFTs.  We ought to r/o a distal colonic bleeding source by sigmoidoscopy.  CT will provide information regarding anasarca and cholestatic liver tests.  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  Barbette Hair. Arlyce Dice, M.D., [facility]

## 2011-04-30 NOTE — ED Notes (Signed)
CBG=116 mg/dl 

## 2011-04-30 NOTE — ED Notes (Signed)
Pt with HR up to 130s with exertion.  Pt denies shortness of breath.

## 2011-04-30 NOTE — ED Notes (Signed)
GI MD at bedside

## 2011-04-30 NOTE — ED Notes (Signed)
Patient is resting comfortably. 

## 2011-04-30 NOTE — Consult Note (Signed)
[facility] Consult: 8:58 AM 04/30/2011   Referring Provider: Toniann Fail  Primary Care Physician: Leilani Able  Primary Gastroenterologist:  Dr. Rosina Lowenstein.   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  Coumadin D/Cd in May 2012. . Around 4 PM patient felt something warm and wet on his thigh. He went to the bathroom he had frank rectal bleed.  There was no associated abdominal pain or n/v.  He felt dizzy but did not lose consciousness he was brought to the ER where he  was tachycardic and hemoglobin was 8.8. In October 2012 Hgb was 8,.0 and MCV was 70.  Glucose was 55.   He took one Aleve on 12/10 and says he uses same single dose maybe 3 to 5 times in a month. Takes Prevaci 30mg  daily which controls his reflux symptoms completely. He does not have constipation and normally has daily BMs.  This past weekend he had mucoid greenish and then yellowish stools but no blood. Denies ever having used Iron.  Patient was given one unit of packed red cells and admitted for further workup. Patient  had EGD and colonoscopy 12/2008 for work up of iron deficiency anemia, results listed below.  He says he had capsule endo then but so far I am unable to locate a report.  Prior episodes of heme positive anemia have been attributed to SB AVMs.   Patient denies any nausea vomiting abdominal pain or diarrhea. He was in the ER yesterday for increasing swelling of his lower extremities, with tense swelling into thighs and penis and was given Lasix and advised to follow up with his internist.  He did not have a chest xray. He reports increased abdominal girth and today his urine is darker in color.  Note that he has elevated Alk Phos of 296, Bili of 2.1 and increased  coags: 18.6 and 1.5, though he does not take Coumadin.  Of note in reviewing [facility] office records:  Pt has not been to GI office for several years and last cards visit was more than a year ago.    Past Medical History  Diagnosis Date  . Venous thromboembolism     IVC filter 2005  . 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 Chronic venous insufficiency   . DM2 (diabetes mellitus, type 2)   . GERD (gastroesophageal reflux disease)   . BPH (benign prostatic hypertrophy)   . Low back pain   . HTN (hypertension)   . HLD (hyperlipidemia)   . NICM (nonischemic cardiomyopathy) 1. 9/11  2. 2/12    1. Echo septal and apical akinesis, dilated LV, EF 45%, RV normal size and systolic function, EF 43% on Myoview  2. Admitted with decompensated CHF, echo EF 30-35% with diffuse hypokinesis but akinesis of mid to apical anteroseptal wall and apex, moderate LVH, mild MR, grade I diastolic dysfunction, SPEP/UP negative and HIV negative  . Smoking   . CAD (coronary artery disease) 1. 2007  2. 10/11    1. Left heart cath with 40-50% stenosis in small RCA EF 50%  2. Lexiscan myoview EF 43%, global hypokinesis, possible small area of apical ischemia; LHC no angiographic CAD, no LV-gram done due to CKD    . CKD (chronic kidney disease) stage 2, GFR 60-89 ml/min as  of 02/2011    Past Surgical History  Procedure Date  . Cardiac catheterization   . Appendectomy   . Cholecystectomy     Prior to Admission medications   Medication Sig Start Date End Date Taking? Authorizing Provider  atorvastatin (LIPITOR) 20 MG tablet Take 20 mg by mouth daily.     Yes Historical Provider, MD  furosemide (LASIX) 40 MG tablet Take 80 mg by mouth daily.    Yes Historical Provider, MD  glipiZIDE (GLUCOTROL XL) 5 MG 24 hr tablet Take 5 mg by mouth daily.     Yes Historical Provider, MD  insulin glargine (LANTUS) 100 UNIT/ML injection Inject 20 Units into the skin at bedtime.     Yes Historical Provider,  MD  lansoprazole (PREVACID) 30 MG capsule Take 30 mg by mouth daily.     Yes Historical Provider, MD  losartan (COZAAR) 100 MG tablet Take 1 tablet (100 mg total) by mouth daily. 09/24/10 09/24/11 Yes Marca Ancona, MD  metoprolol (TOPROL-XL) 100 MG 24 hr tablet Take 100 mg by mouth daily.    Yes Historical Provider, MD  oxyCODONE-acetaminophen (PERCOCET) 10-325 MG per tablet Take 1-2 tablets by mouth every 4 (four) hours as needed. For pain    Yes Historical Provider, MD    Scheduled Meds:    . dextrose  50 mL Intravenous STAT  . insulin aspart  0-9 Units Subcutaneous TID WC  . pantoprazole (PROTONIX) IV  40 mg Intravenous Once  . sodium chloride  1,000 mL Intravenous Once   Infusions:    . dextrose 5 % and 0.45 % NaCl with KCl 20 mEq/L     PRN Meds:    Allergies as of 04/29/2011  . (No Known Allergies)    Family History  Problem Relation Age of Onset  . Coronary artery disease Neg Hx     Premature    History   Social History  . Marital Status: Divorced    Spouse Name: N/A    Number of Children: 1  . Years of Education: N/A   Occupational History  . security guard     Part time   Social History Main Topics  . Smoking status: Current Everyday Smoker -- 1.0 packs/day  . Smokeless tobacco: Not on file  . Alcohol Use: No  . Drug Use: Not on file  . Sexually Active: Not on file   Other Topics Concern  . Not on file   Social History Narrative   Has 1 son.    REVIEW OF SYSTEMS: Constitutional:          Fluctuating weight ENT:  No epistaxis since October 2012 Pulm:  No cough or SOB CV:  No chest  Pain or palps GU:  No hematuria.  + frequency after Lasix yesterday RENAL:  Pt states he was never told he had kidney disease. GI:  No dysphagia, n/v, anorexia Heme:  Prior transfusions and just got one unit of blood.   No oral bleeding or "free bleeding" Transfusions:  Yes Neuro:  No headache or seizures.  No numbness or tingling. Derm:  Recent derm office visit  with Bx of lesions that are pustule like and erupt all over his body.  Endocrine:  Sugars at home are below 120 for the most part  Immunization: Flu shot up to date. Travel:  none   PHYSICAL EXAM: Vital signs in last 24 hours: Temp:  [97.3 F (36.3 C)-99.3 F (37.4 C)] 98 F (36.7 C) (12/12 0015) Pulse Rate:  [  111-122] 119  (12/12 0630) Resp:  [16-31] 19  (12/12 0630) BP: (115-134)/(74-103) 123/103 mmHg (12/12 0630) SpO2:  [93 %-100 %] 99 % (12/12 0630)  General: does not look acutely ill Head:  Atraumatic, normocephllic  Eyes:  No icterus or conj pallor Ears:  Slightly HOH  Nose:  No discharge Mouth:  Clear, pink , moist MM.  Native dentition fair repair Neck:  No JVD or masses.  No thyromegaly Lungs:  Clear B.  Not SOB. No cough Heart: Tachy, 2 to 3/6 systolic murmer. Abdomen:  Soft, bulging flanks, nontender, active BS.  No succusion splash.  No organomegaly or masses  Rectal: soft brown stool in vault, sample is FOB negative   Musc/Skeltl: no swollen joints Extremities:  Anasarca with 2 to 3 plus pitting in both legs/thighs worse on right.  Neurologic:  A & O x 3.  Alert, cooperative,  No tremor or gross deficits. Good historian. Skin:  Multiple nodules on trunk, arms that are in various stages of healing, none are pustular Tattoos:  None. Nodes:  None.   Psych:  Cooperative, not depressed or agitated.  Intake/Output from previous day: 12/11 0701 - 12/12 0700 In: 162.5 [Blood:162.5] Out: -   LAB RESULTS:  Basename 04/29/11 1856 04/28/11 1504  WBC 3.9* 4.1  HGB 8.8* 8.9*  HCT 31.7* 32.3*  PLT 237 241   BMET Lab Results  Component Value Date   NA 138 04/29/2011   NA 139 04/28/2011   NA 140 03/15/2011   K 3.7 04/29/2011   K 3.4* 04/28/2011   K 3.2* 03/15/2011   CL 104 04/29/2011   CL 106 04/28/2011   CL 101 03/15/2011   CO2 23 04/29/2011   CO2 25 04/28/2011   CO2 22 02/20/2011   GLUCOSE 55* 04/29/2011   GLUCOSE 49* 04/28/2011   GLUCOSE 117* 03/15/2011    BUN 14 04/29/2011   BUN 11 04/28/2011   BUN 12 03/15/2011   CREATININE 1.32 04/29/2011   CREATININE 1.22 04/28/2011   CREATININE 1.50* 03/15/2011   CALCIUM 8.9 04/29/2011   CALCIUM 8.9 04/28/2011   CALCIUM 9.0 02/20/2011   LFT Lab Results  Component Value Date   PROT 7.5 04/28/2011   PROT 7.4 02/20/2011   PROT 7.3 09/28/2010   ALBUMIN 2.6* 04/28/2011   ALBUMIN 3.3* 02/20/2011   ALBUMIN 3.7 09/28/2010   AST 28 04/28/2011   AST 39* 02/20/2011   AST 26 09/28/2010   ALT 20 04/28/2011   ALT 37 02/20/2011   ALT 30 09/28/2010   ALKPHOS 296* 04/28/2011   ALKPHOS 186* 02/20/2011   ALKPHOS 99 09/28/2010   BILITOT 2.1* 04/28/2011   BILITOT 2.5* 02/20/2011   BILITOT 0.4 09/28/2010   BILIDIR 0.5* 02/20/2011   BILIDIR 0.2 07/23/2010   IBILI 2.0* 02/20/2011   PT/INR Lab Results  Component Value Date   INR 1.52* 04/28/2011   INR 1.32 03/15/2011   INR 1.40 02/20/2011    Drugs of Abuse     Component Value Date/Time   LABOPIA NONE DETECTED 09/28/2010 1818   COCAINSCRNUR NONE DETECTED 09/28/2010 1818   LABBENZ NONE DETECTED 09/28/2010 1818   AMPHETMU NONE DETECTED 09/28/2010 1818   THCU NONE DETECTED 09/28/2010 1818   LABBARB  Value: NONE DETECTED        DRUG SCREEN FOR MEDICAL PURPOSES ONLY.  IF CONFIRMATION IS NEEDED FOR ANY PURPOSE, NOTIFY LAB WITHIN 5 DAYS.        LOWEST DETECTABLE LIMITS FOR URINE DRUG SCREEN Drug Class  Cutoff (ng/mL) Amphetamine      1000 Barbiturate      200 Benzodiazepine   200 Tricyclics       300 Opiates          300 Cocaine          300 THC              50 09/28/2010 1818     RADIOLOGY STUDIES: No results found.  ENDOSCOPIC STUDIES:  EGD,   Walker Kehr MD   01/15/2009 DESCRIPTION OF PROCEDURE: After the risks benefits and alternatives of the procedure were thoroughly explained, informed consent was obtained. The [id number] ([id number]) endoscope was introduced through the mouth and advanced to the third portion of the duodenum, without limitations. The instrument was  slowly withdrawn as the mucosa was fully examined.  AVM in the second portion of the duodenum (see image001). Single 2mm AVM briefly seen hemorrhagic mucosa in the cardia (see image003). Erosive gastritis with small amount of blood in moderate sized hiatal hernia Esophagitis was found (see image005). Erosive esophagitis in distal esophagus Retroflexed views revealed no abnormalities. The scope was then withdrawn from the patient and the procedure terminated.  COMPLICATIONS: None  IMPRESSION:  1) AVM in the second portion duodenum  2) Hemorrhagic mucosa in the cardia  3) Esophagitis  RECOMMENDATIONS:  1) capsule endoscopy  2) protonix bid   EGD  Rob Bunting  10/2005 Hiatal hernia, but no obvious explanation for his melena and anemia.  Comments:  clinically he shows no signs of ongoing bleeding (last BM over 24  hours ago and good bump in Hb with transfusion).  Colonoscopy    01/15/2009 FINDINGS: A normal appearing cecum, ileocecal valve, and appendiceal orifice were identified. The ascending, hepatic flexure, transverse, splenic flexure, descending, sigmoid colon, and rectum appeared unremarkable. Retroflexed views in the rectum revealed no abnormalities. The scope was then withdrawn from the patient and the procedure completed.  COMPLICATIONS: None  ENDOSCOPIC IMPRESSION:  1) Normal colon  Colonoscopy   Rob Bunting   10/2005 Findings  POLYP: Sigmoid Colon, Maximum size: 4 mm. sessile polyp. Procedure:  snare without cautery, removed, retrieved, Polyp sent to pathology.  Path # 1. ICD9: Colon Polyps,  otherwise normal  examination.    Pathology:  COLON, SIGMOID POLYP: HYPERPLASTIC POLYP. NO ADENOMATOUS CHANGE   OR MALIGNANCY IDENTIFIED.  Unable to locate Capsule Endoscopy which pt says was done the day after the colon/egd in late August.  ECHOCARDIOGRAM    07 Jul 2010: Study Conclusions  - Left ventricle: The cavity size was normal. Wall thickness was     increased in a pattern  of moderate LVH. Systolic function was     moderately to severely reduced. The estimated ejection fraction     was in the range of 30% to 35%. Diffuse hypokinesis. Akinesis of     the mid-distal anteroseptal and apical myocardium. Doppler     parameters are consistent with abnormal left ventricular     relaxation (grade 1 diastolic dysfunction).   - Mitral valve: Mild regurgitation.   Impressions:  - Compared to the previous study, LV systolic function is worse..   Transthoracic echocardiography. M-mode, complete 2D, spectral   Doppler, and color Doppler. Height: Height: 180.3cm. Height: 71in.   Weight: Weight: 84.8kg. Weight: 186.6lb.  IMPRESSION: 1. Painless Hematochezia, single episode and now FOB negative.  Sounds diverticular but on 12/2010 colonoscopy no mention of diveriticulosis. Hemodynamically BPs are actually abit high and he is  2. Microcytic anemia.  S/p one unit  PRBC.   3.  Anasarca, low albumin, elevated Alk Phos/TBili/PT and INR.  I hear a harsh cardiac murmer, wonder if he has worsening heart failure or progressive valvular regurge? 4.  DM2 Insulin requiring, note low glucose.   5.  Chronic Kidney Dz, type 2.  Current BUN/CREAT are wnl.  PLAN: 1.  Ordered a CMET, proBNP, PT/INR, Acute hepatitis panel, prealbumin. 2.  Since I can not locate a capsule endoscopy, we may need to do this.  ? D we need to repeat EGD or colonoscopy? 3.  Will let pt eat if we do not pursue endoscopic studies today. I went ahead and ordered carb mod diet as he just had a soft, formed light brown stool that I observed in commode. 4.  May need to involve cardiology especially in BNP is elevated. 5.  May need ultrasound of abdomen   LOS: 1 day   Jennye Moccasin  04/30/2011, 8:58 AM Pager: [phone]

## 2011-04-30 NOTE — Progress Notes (Signed)
Subjective: Patient examined in the emergency department. Since admission his rectal bleeding has stopped. He denies any abdominal pain, shortness of breath, chest pain, nausea or vomiting. Endorses hunger.  Objective: Vital signs in last 24 hours: Temp:  [97.3 F (36.3 C)-99.3 F (37.4 C)] 97.7 F (36.5 C) (12/12 1337) Pulse Rate:  [111-127] 127  (12/12 1156) Resp:  [16-31] 24  (12/12 1337) BP: (112-138)/(74-104) 133/103 mmHg (12/12 1337) SpO2:  [93 %-100 %] 100 % (12/12 1337) Weight change:     Intake/Output from previous day: 12/11 0701 - 12/12 0700 In: 162.5 [Blood:162.5] Out: -  Intake/Output this shift:   General appearance: alert, cooperative, appears stated age and no distress Resp: clear to auscultation bilaterally, on room air Cardio: regular rate and rhythm, S1, S2 normal, no murmur, click, rub or gallop, IV fluids infusing at 150 cc an hour; repeat blood pressure confirmed as hypertensive with a diastolic of 113. GI: soft, non-tender; bowel sounds normal; no masses,  no organomegaly Extremities: extremities normal, atraumatic, no cyanosis or edema  Lab Results:  Basename 04/29/11 1856 04/28/11 1504  WBC 3.9* 4.1  HGB 8.8* 8.9*  HCT 31.7* 32.3*  PLT 237 241   BMET  Basename 04/30/11 1105 04/29/11 1856  NA 137 138  K 3.8 3.7  CL 103 104  CO2 23 23  GLUCOSE 126* 55*  BUN 14 14  CREATININE 1.33 1.32  CALCIUM 8.9 8.9    Studies/Results: No results found.  Medications:  I have reviewed the patient's current medications. Scheduled:   . dextrose  50 mL Intravenous STAT  . insulin aspart  0-9 Units Subcutaneous TID WC  . losartan  50 mg Oral Daily  . metoprolol tartrate  25 mg Oral BID  . pantoprazole (PROTONIX) IV  40 mg Intravenous Once  . sodium chloride  1,000 mL Intravenous Once    Assessment/Plan:  Principal Problem:  *Rectal bleeding Appreciate gastroenterology consultation and input. Further evaluation as per GI.  Active Problems:  Essential hypertension, benign Patient now has moderately uncontrolled hypertension. He has not received his usual blood pressure medications and has actually been receiving IV fluids due to presentation with rectal bleeding and relative hypotension. We will KVO IV fluids and resume his Lopressor at a lower dose of 25 mg twice a day. We will also resume his Cozaar at a lower dose of 50 mg by mouth twice a day.  Chronic anemia Patient's hemoglobin was last recorded in Epic on 03/15/2011 and was 10.9 after blood transfusion.  He presented with a hemoglobin of 8.9 last night. Since he is hemodynamically stable and has had no further bleeding doubt he has had significant drop in hemoglobin at this time. We'll go ahead and repeat CBC now and in the morning.   CORONARY ATHEROSCLEROSIS NATIVE CORONARY ARTERY Currently asymptomatic without chest pain. We'll follow.   Diastolic heart failure, NYHA class 1/ Non-ischemic cardiomyopathy/EF 45% on recent echo Currently compensated clinically-has mildly elevated pro BNP. Goal is to get blood pressure under control and avoid overhydration which we are doing.   DVT (deep venous thrombosis) Documented history of recurrent  DVT status post IVC filter placement. He has been off Coumadin for 2 months because of recent gastric AVM bleeding.   Hypotension He presented to the ER with rectal bleeding and a relative hypotension with systolic blood pressure readings 118 and mild tachycardia with heart rates between 111 and 120. The admitting physician gave the patient a 1 L normal saline bolus and subsequent IV  fluids had previously been infusing in the 150 cc an hour. These fluids have subsequently been slowed to keep open rate due to hypertension. Suspect tachycardia could have been multifactorial due to anxiety as well as possible beta blocker withdrawal.   Gastric AVM No signs of upper GI bleeding. Apparently location of prior AVMs was in the duodenum. No indication at  this time for Protonix drip. Continue home PPI.   Diabetes mellitus CBG 's were somewhat low this morning with nadir 49 and patient was symptomatic therefore he was given a one-time dose of dextrose 50%. In addition IV fluids with dextrose were initiated.   Chronic kidney disease This is listed as a chronic problem and was the rationale for not obtaining LV gram on last cardiac catheterization. I do not have a height or weight at this time so I cannot calculate creatinine clearance therefore cannot characterize the degree of chronic kidney disease at this time.  Disposition At the present time the patient is stable and no longer requires step-down level care therefore he will transfer to the telemetry unit.   LOS: 1 day   Junious Silk, ANP pager [phone] 04/30/2011, 3:51 PM  I have personally examined this patient and reviewed the entire database. I have reviewed the above note, made any necessary editorial changes, and agree with its content.  Lonia Blood, MD [facility]

## 2011-04-30 NOTE — ED Notes (Signed)
Medications requested from pharmacy.

## 2011-05-01 ENCOUNTER — Encounter (HOSPITAL_COMMUNITY)
Admission: EM | Disposition: A | Discharge status: Home / Self Care | Payer: Self-pay | Source: Ambulatory Visit | Attending: Internal Medicine

## 2011-05-01 ENCOUNTER — Encounter (HOSPITAL_COMMUNITY): Payer: Self-pay | Admitting: *Deleted

## 2011-05-01 DIAGNOSIS — D509 Iron deficiency anemia, unspecified: Secondary | ICD-10-CM

## 2011-05-01 DIAGNOSIS — K921 Melena: Secondary | ICD-10-CM

## 2011-05-01 HISTORY — PX: COLONOSCOPY: SHX5424

## 2011-05-01 LAB — GLUCOSE, CAPILLARY
Glucose-Capillary: 82 mg/dL (ref 70–99)
Glucose-Capillary: 120 mg/dL — ABNORMAL HIGH (ref 70–99)

## 2011-05-01 LAB — COMPREHENSIVE METABOLIC PANEL
BUN: 14 mg/dL (ref 6–23)
CO2: 22 mEq/L (ref 19–32)
Chloride: 101 mEq/L (ref 96–112)
Creatinine, Ser: 1.27 mg/dL (ref 0.50–1.35)
GFR calc non Af Amer: 58 mL/min — ABNORMAL LOW (ref >90)
Total Bilirubin: 2.9 mg/dL — ABNORMAL HIGH (ref 0.3–1.2)

## 2011-05-01 LAB — CBC
HCT: 36.4 % — ABNORMAL LOW (ref 39.0–52.0)
Hemoglobin: 10.2 g/dL — ABNORMAL LOW (ref 13.0–17.0)
MCH: 19.1 pg — ABNORMAL LOW (ref 26.0–34.0)
Platelets: 258 10*3/uL (ref 150–400)
RBC: 5.35 MIL/uL (ref 4.22–5.81)
RBC: 5.14 MIL/uL (ref 4.22–5.81)
WBC: 4.3 10*3/uL (ref 4.0–10.5)

## 2011-05-01 SURGERY — SIGMOIDOSCOPY, FLEXIBLE
Anesthesia: Moderate Sedation

## 2011-05-01 SURGERY — COLONOSCOPY
Anesthesia: Moderate Sedation

## 2011-05-01 MED ORDER — FENTANYL CITRATE 0.05 MG/ML IJ SOLN
INTRAMUSCULAR | Status: DC | PRN
  Administered 2011-05-01 (×4): 25 ug via INTRAVENOUS

## 2011-05-01 MED ORDER — MIDAZOLAM HCL 10 MG/2ML IJ SOLN
INTRAMUSCULAR | Status: AC
  Filled 2011-05-01: qty 2

## 2011-05-01 MED ORDER — GLUCOSE 40 % PO GEL
1.0000 | ORAL | Status: DC | PRN
  Filled 2011-05-01: qty 1

## 2011-05-01 MED ORDER — FENTANYL CITRATE 0.05 MG/ML IJ SOLN
INTRAMUSCULAR | Status: AC
  Filled 2011-05-01: qty 2

## 2011-05-01 MED ORDER — SODIUM CHLORIDE 0.9 % IV SOLN
INTRAVENOUS | Status: DC
  Administered 2011-05-01: 500 mL via INTRAVENOUS

## 2011-05-01 MED ORDER — MIDAZOLAM HCL 10 MG/2ML IJ SOLN
INTRAMUSCULAR | Status: DC | PRN
  Administered 2011-05-01 (×2): 2 mg via INTRAVENOUS
  Administered 2011-05-01: 1 mg via INTRAVENOUS
  Administered 2011-05-01: 2 mg via INTRAVENOUS

## 2011-05-01 MED ORDER — GLUCOSE 40 % PO GEL
ORAL | Status: AC
  Administered 2011-05-01: 37.5 g
  Filled 2011-05-01: qty 1

## 2011-05-01 MED ORDER — DEXTROSE 50 % IV SOLN
25.0000 mL | Freq: Once | INTRAVENOUS | Status: AC | PRN
  Administered 2011-05-01: 25 mL via INTRAVENOUS

## 2011-05-01 MED ORDER — DEXTROSE 50 % IV SOLN
INTRAVENOUS | Status: AC
  Filled 2011-05-01: qty 50

## 2011-05-01 NOTE — Progress Notes (Signed)
CBG:70  Treatment: D50 IV 25 mL  Symptoms: None  Follow-up CBG: Time:1245 CBG Result:120  Possible Reasons for Event: Inadequate meal intake  Comments/MD notified:Hypoglycemic event protocol    Marissa Nestle

## 2011-05-01 NOTE — Progress Notes (Signed)
Utilization Review Completed.Jonica Bickhart T12/13/2012   

## 2011-05-01 NOTE — Progress Notes (Signed)
CT was reviewed and demonstrates a cyst with portal hypertension and ascites. Suspect etiology is related to chronic passive congestion and cardiac disease.  Recommendations #1 sigmoidoscopy #2 diagnostic paracentesis

## 2011-05-01 NOTE — Progress Notes (Signed)
Gi Daily Rounding Note 05/01/2011, 10:30 AM  SUBJECTIVE: Did not see pt today.  He is for Flex Sig today  OBJECTIVE:Vital signs in last 24 hours: Temp:  [97.2 F (36.2 C)-98.1 F (36.7 C)] 97.4 F (36.3 C) (12/13 0500) Pulse Rate:  [100-127] 118  (12/13 0500) Resp:  [20-25] 20  (12/13 0500) BP: (116-146)/(88-105) 121/88 mmHg (12/13 0500) SpO2:  [99 %-100 %] 100 % (12/13 0500) Weight:  [92.262 kg (203 lb 6.4 oz)-98 kg (216 lb 0.8 oz)] 203 lb 6.4 oz (92.262 kg) (12/13 0500) Last BM Date: 04/30/11  Intake/Output from previous day: 12/12 0701 - 12/13 0700 In: 585.2 [I.V.:585.2] Out: 301 [Urine:300; Stool:1]  Intake/Output this shift:    Lab Results:  Basename 05/01/11 0405 04/30/11 1622 04/29/11 1856  WBC 4.7 4.3 3.9*  HGB 10.2* 10.1* 8.8*  HCT 36.4* 35.2* 31.7*  PLT 247 234 237   BMET  Basename 05/01/11 0405 04/30/11 1105 04/29/11 1856  NA 134* 137 138  K 3.8 3.8 3.7  CL 101 103 104  CO2 22 23 23   GLUCOSE 87 126* 55*  BUN 14 14 14   CREATININE 1.27 1.33 1.32  CALCIUM 9.1 8.9 8.9   LFT  Basename 05/01/11 0405  PROT 7.9  ALBUMIN 2.8*  AST 38*  ALT 32  ALKPHOS 308*  BILITOT 2.9*  BILIDIR --  IBILI --   PT/INR  Basename 04/30/11 1919 04/30/11 1105  LABPROT 19.2* 18.6*  INR 1.58* 1.52*   Hepatitis Panel Acute hepatitis serologies pending  Studies/Results: Ct Abdomen Pelvis W Contrast  04/30/2011  **ADDENDUM** CREATED: 04/30/2011 18:02:34  Compression fracture of L1, unchanged from radiograph of 01/30/2010.  **END ADDENDUM** SIGNED BY: Dineen Kid. Chestine Spore, M.D.   04/30/2011  *RADIOLOGY REPORT*  Clinical Data: Bleeding, abdominal pain  CT ABDOMEN AND PELVIS WITH CONTRAST  Technique:  Multidetector CT imaging of the abdomen and pelvis was performed following the standard protocol during bolus administration of intravenous contrast.  Contrast: 80mL OMNIPAQUE IOHEXOL 300 MG/ML IV SOLN  Comparison: CT 01/15/2009  Findings: Moderately large pleural  effusions bilaterally with mild bibasilar atelectasis.  Relatively small liver with mild irregularity of the liver contour suggestive of cirrhosis.  There is moderate ascites.  No focal liver mass is seen. Negative for gastric varices.  Clips are present in the gallbladder fossa.  The spleen is not enlarged. Pancreas and kidneys are normal.  Mild hypertrophy of the left adrenal gland is unchanged.  IVC filter is again noted and unchanged in position.  Subcutaneous edema is present diffusely.  Subcutaneous venous collaterals are present in the anterior abdominal wall extending down to the pubic area.  This may be due to portal hypertension or could be related to chronic DVT.  There is no venous contrast on the study to evaluate for acute DVT.  Scattered 10 mm external iliac nodes are present bilaterally of questionable significance.  These may be reactive.  Small retroperitoneal nodes are present to the left of the aorta measuring less than 1 cm.  Some these were present previously.  Negative for bowel obstruction.  No bowel mass or acute bowel edema is present.  IMPRESSION: Findings suggestive of cirrhosis with a small liver and moderate ascites.  Bilateral pleural effusions are present.  Subcutaneous venous collaterals present in the anterior abdominal wall .  There is anasarca.  This may be related to chronic DVT versus liver failure.  Original Report Authenticated By: Camelia Phenes, M.D.    ASSESMENT: 1.  Cirrhosis.  Etiology not alcoholic ( never was a heavy drinker and no etoh for many years).  Suspect cardiogenic. Somewhat decompensated with coagulopathy, ascites. ALP is elevated 2.  CHF.  BNP elevated.  LE edema present.  Wt down 6 Kg since measured yesterday. 3.  Single episode rectal bleeding, subsequent brown BMs, tested heme - in ED. 4.  IddM type 2. 5.  Heart murmer.   PLAN: Flex sig today.  Await serologies. Would call cardiologist Corinda Gubler).   LOS: 2 days   [name]  05/01/2011,  10:30 AM Pager: [phone]

## 2011-05-01 NOTE — Progress Notes (Signed)
Sigmoidoscopy was switched to a colonoscopy since the patient was perfectly well prepped. There was no old or new blood. Source for GI bleeding was not determined.

## 2011-05-01 NOTE — Op Note (Signed)
[facility] [address] [place], [place]  [address]  COLONOSCOPY PROCEDURE REPORT  PATIENT:  [name], [name]  MR#:  [id number] BIRTHDATE:  11/17/1945, 65 yrs. old  GENDER:  male ENDOSCOPIST:  Barbette Hair. Arlyce Dice, MD REF. BY:  Leilani Able, M.D. PROCEDURE DATE:  05/01/2011 PROCEDURE:  Diagnostic Colonoscopy ASA CLASS:  Class II INDICATIONS:  rectal bleeding MEDICATIONS:   These medications were titrated to patient response per physician's verbal order, Fentanyl 100 mcg IV, Versed 6 mg IV  DESCRIPTION OF PROCEDURE:   After the risks benefits and alternatives of the procedure were thoroughly explained, informed consent was obtained.  Digital rectal exam was performed and revealed no abnormalities.   The colon/adult [id number] and [id number] endoscope was introduced through the anus and advanced to the cecum, which was identified by both the appendix and ileocecal valve, without limitations.  The quality of the prep was excellent, using Fleets Enemas.  The instrument was then slowly withdrawn as the colon was fully examined. <<PROCEDUREIMAGES>>  FINDINGS:  normal colon otherwise. the mucosa was bruised, probably related to mild coagulopathy. there was no fresh or old blood (see image001, image003, image005, image006, and image007).   Retroflexed views in the rectum revealed no abnormalities.    The time to cecum =  minutes. The scope was then withdrawn in  minutes from the cecum and the procedure completed. COMPLICATIONS:  None ENDOSCOPIC IMPRESSION: 1) Normal RECOMMENDATIONS: No further GI workup her bleeding unless patient begins to actively rebleed.  REPEAT EXAM:  No  ______________________________ Barbette Hair. Arlyce Dice, MD  CC:  n. eSIGNED:   Barbette Hair. Kaplan at 05/01/2011 03:36 PM  Erline Levine, [phone]

## 2011-05-01 NOTE — Progress Notes (Signed)
Patient ID: [name], male   DOB: Oct 01, 1945, 65 y.o.   MRN: [id number] Subjective: No events overnight. Patient denies chest pain, shortness of breath, abdominal pain. Had bowel movement and reports ambulating.  Objective:  Vital signs in last 24 hours:  Filed Vitals:   05/01/11 1537 05/01/11 1538 05/01/11 1553 05/01/11 1658  BP: 135/78  132/93 149/98  Pulse:    117  Temp:    97.3 F (36.3 C)  TempSrc:    Oral  Resp: 21  19 18   Height:      Weight:      SpO2: 92% 95% 95% 99%    Intake/Output from previous day:   Intake/Output Summary (Last 24 hours) at 05/01/11 1718 Last data filed at 05/01/11 1553  Gross per 24 hour  Intake 910.16 ml  Output    301 ml  Net 609.16 ml    Physical Exam: General: Alert, awake, oriented x3, in no acute distress. HEENT: No bruits, no goiter. Moist mucous membranes, no scleral icterus, no conjunctival pallor. Heart: Regular rate and rhythm, without murmurs, rubs, gallops. Lungs: Clear to auscultation bilaterally. No wheezing, no rhonchi, no rales.  Abdomen: Soft, nontender, nondistended, positive bowel sounds. Extremities: No clubbing cyanosis or edema,  positive pedal pulses. Neuro: Grossly intact, nonfocal.    Lab Results:  Basic Metabolic Panel:    Component Value Date/Time   NA 134* 05/01/2011 0405   K 3.8 05/01/2011 0405   CL 101 05/01/2011 0405   CO2 22 05/01/2011 0405   BUN 14 05/01/2011 0405   CREATININE 1.27 05/01/2011 0405   GLUCOSE 87 05/01/2011 0405   CALCIUM 9.1 05/01/2011 0405   CBC:    Component Value Date/Time   WBC 4.7 05/01/2011 0405   HGB 10.2* 05/01/2011 0405   HCT 36.4* 05/01/2011 0405   PLT 247 05/01/2011 0405   MCV 68.0* 05/01/2011 0405   NEUTROABS 2.0 04/29/2011 1856   LYMPHSABS 1.0 04/29/2011 1856   MONOABS 0.8 04/29/2011 1856   EOSABS 0.1 04/29/2011 1856   BASOSABS 0.0 04/29/2011 1856      Lab 05/01/11 0405 04/30/11 1622 04/29/11 1856 04/28/11 1504  WBC 4.7 4.3 3.9* 4.1  HGB 10.2*  10.1* 8.8* 8.9*  HCT 36.4* 35.2* 31.7* 32.3*  PLT 247 234 237 241  MCV 68.0* 67.3* 66.0* 66.2*  MCH 19.1* 19.3* 18.3* 18.2*  MCHC 28.0* 28.7* 27.8* 27.6*  RDW 23.6* 23.2* 22.9* 22.8*  LYMPHSABS -- -- 1.0 --  MONOABS -- -- 0.8 --  EOSABS -- -- 0.1 --  BASOSABS -- -- 0.0 --  BANDABS -- -- -- --    Lab 05/01/11 0405 04/30/11 1105 04/29/11 1856 04/28/11 1504  NA 134* 137 138 139  K 3.8 3.8 3.7 3.4*  CL 101 103 104 106  CO2 22 23 23 25   GLUCOSE 87 126* 55* 49*  BUN 14 14 14 11   CREATININE 1.27 1.33 1.32 1.22  CALCIUM 9.1 8.9 8.9 8.9  MG -- -- -- --    Lab 04/30/11 1919 04/30/11 1105 04/28/11 1504  INR 1.58* 1.52* 1.52*  PROTIME -- -- --   Cardiac markers: No results found for this basename: CK:3,CKMB:3,TROPONINI:3,MYOGLOBIN:3 in the last 168 hours No components found with this basename: POCBNP:3 Recent Results (from the past 240 hour(s))  MRSA PCR SCREENING     Status: Normal   Collection Time   04/30/11  8:11 PM      Component Value Range Status Comment   MRSA by PCR NEGATIVE  NEGATIVE  Final     Studies/Results: Ct Abdomen Pelvis W Contrast  04/30/2011 Compression fracture of L1, unchanged from radiograph of 01/30/2010.   04/30/2011 IMPRESSION: Findings suggestive of cirrhosis with a small liver and moderate ascites.  Bilateral pleural effusions are present.  Subcutaneous venous collaterals present in the anterior abdominal wall .  There is anasarca.  This may be related to chronic DVT versus liver failure.     Medications: Scheduled Meds:   . dextrose      . insulin aspart  0-9 Units Subcutaneous TID WC  . losartan  50 mg Oral Daily  . metoprolol tartrate  25 mg Oral BID  . sodium chloride  3 mL Intravenous Q12H  . sodium phosphate  2 enema Rectal 120 min pre-op  . DISCONTD: HYDROmorphone       Continuous Infusions:   . sodium chloride 500 mL (05/01/11 1500)  . dextrose 5 % and 0.45 % NaCl with KCl 20 mEq/L 10 mL/hr at 05/01/11 0600  . DISCONTD:  pantoprozole (PROTONIX) infusion     PRN Meds:.acetaminophen, acetaminophen, dextrose, dextrose, iohexol, labetalol, ondansetron (ZOFRAN) IV, ondansetron, DISCONTD: fentaNYL, DISCONTD: midazolam  Assessment/Plan:  Principal Problem:  *Rectal bleeding - patient is status post colonoscopy 05/01/2011 with no evidence of gross blood; GI is following  Active Problems:  Systolic and diastolic CHF, acute on chronic - start lasix 20 mg IV BID, strict intake and output and daily weight; recheck pro-BNP   Diabetes mellitus - CBG monitoring and sliding scale insulin   Chronic kidney disease - creatinine stable at present; continue to monitor   Essential hypertension, benign - not at goal; we will increase metoprolol to 50 mg BID if HR tolerates it   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

## 2011-05-01 NOTE — Progress Notes (Signed)
HypoglycemicCBG: 65  Treatment: 15 GM gel  Symptoms: Nervous/irritable  Follow-up CBG: Time:1215 CBG Result:70  Possible Reasons for Event: Inadequate meal intake  Comments/MD notified: hypoglycemic protocol    Marissa Nestle

## 2011-05-02 ENCOUNTER — Encounter (HOSPITAL_COMMUNITY): Payer: Self-pay | Admitting: Gastroenterology

## 2011-05-02 LAB — TYPE AND SCREEN
Antibody Screen: NEGATIVE
Unit division: 0
Unit division: 0

## 2011-05-02 LAB — GLUCOSE, CAPILLARY: Glucose-Capillary: 183 mg/dL — ABNORMAL HIGH (ref 70–99)

## 2011-05-02 LAB — HEPATITIS PANEL, ACUTE
HCV Ab: NEGATIVE
Hep A IgM: NEGATIVE
Hep B C IgM: NEGATIVE
Hepatitis B Surface Ag: NEGATIVE

## 2011-05-02 LAB — CBC
HCT: 34.8 % — ABNORMAL LOW (ref 39.0–52.0)
MCV: 68.8 fL — ABNORMAL LOW (ref 78.0–100.0)
Platelets: 262 10*3/uL (ref 150–400)
RBC: 5.06 MIL/uL (ref 4.22–5.81)
WBC: 4 10*3/uL (ref 4.0–10.5)

## 2011-05-02 NOTE — Progress Notes (Signed)
05/02/11 0915 Nursing Note: Pt discharged per MD's order. Pt stable with no complaints at this time. IV and cardiac monitor discontinued prior to pt discharge. Pt Sinus Tach on monitor, pt's baseline. MD aware. Pt will be escorted to car safely by volunteer. [name] Scientist, clinical (histocompatibility and immunogenetics).

## 2011-05-02 NOTE — Discharge Summary (Addendum)
Patient ID: [name] MRN: [id number] DOB/AGE: 63-Mar-1947 63 y.o.  Admit date: 04/29/2011 Discharge date: 05/02/2011  Primary Care Physician:  Karie Chimera, MD  Discharge Diagnoses:    Present on Admission:  .Rectal bleeding .Systolic and diastolic CHF, acute on chronic .DVT (deep venous thrombosis) .Essential hypertension, benign .Hypotension .Diabetes mellitus .Chronic kidney disease  Principal Problem:  *Rectal bleeding Active Problems:  Systolic and diastolic CHF, acute on chronic  Diabetes mellitus  Chronic kidney disease  Essential hypertension, benign  DVT (deep venous thrombosis)   Current Discharge Medication List    CONTINUE these medications which have NOT CHANGED   Details  atorvastatin (LIPITOR) 20 MG tablet Take 20 mg by mouth daily.      furosemide (LASIX) 40 MG tablet Take 80 mg by mouth daily.     glipiZIDE (GLUCOTROL XL) 5 MG 24 hr tablet Take 5 mg by mouth daily.      insulin glargine (LANTUS) 100 UNIT/ML injection Inject 20 Units into the skin at bedtime.      lansoprazole (PREVACID) 30 MG capsule Take 30 mg by mouth daily.      losartan (COZAAR) 100 MG tablet Take 1 tablet (100 mg total) by mouth daily. Qty: 30 tablet, Refills: 11    metoprolol (TOPROL-XL) 100 MG 24 hr tablet Take 100 mg by mouth daily.     oxyCODONE-acetaminophen (PERCOCET) 10-325 MG per tablet Take 1-2 tablets by mouth every 4 (four) hours as needed. For pain         Disposition and Follow-up: follow up with PCP Monday 05/05/11  Consults:  GI  Significant Diagnostic Studies:  No results found.  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 Around  4 PM patient was eating and felt something warm down his legs and he went to the bathroom and notice blood is coming out. He felt dizzy but did not lose consciousness he was brought to the ER. In the ER patient was found tachycardic and also his hemoglobin was around 8. Patient is already started on blood transfusion one unit. Patient will be admitted for further workup. Patient states he has had EGD and colonoscopy 2 months ago results of which he is not sure of. Patient denies any nausea vomiting abdominal pain or diarrhea. He was in the ER yesterday for increasing swelling of his lower extremity.   Physical Exam on Discharge:  Filed Vitals:   05/01/11 1658 05/01/11 2100 05/01/11 2130 05/02/11 0654  BP: 149/98 147/104 122/80 122/90  Pulse: 117 123 122 119  Temp: 97.3 F (36.3 C) 98.3 F (36.8 C)  98.1 F (36.7 C)  TempSrc: Oral     Resp: 18 20  22   Height:      Weight:    93.3 kg (205 lb 11 oz)  SpO2: 99% 99%  100%     Intake/Output Summary (Last 24 hours) at 05/02/11 0912 Last data filed at 05/01/11 2200  Gross per 24 hour  Intake    805 ml  Output    153 ml  Net    652 ml    General: Alert, awake, oriented x3, in no acute distress. HEENT: No bruits, no goiter. Heart: Regular rate and rhythm,  without murmurs, rubs, gallops. Lungs: Clear to auscultation bilaterally. Abdomen: Soft, nontender, nondistended, positive bowel sounds. Extremities: Right lower extremity slightly more swollen than the left but as per patient chronic in nature  Neuro: Grossly intact, nonfocal.  CBC:    Component Value Date/Time   WBC 4.0 05/02/2011 0620   HGB 9.7* 05/02/2011 0620   HCT 34.8* 05/02/2011 0620   PLT 262 05/02/2011 0620   MCV 68.8* 05/02/2011 0620   NEUTROABS 2.0 04/29/2011 1856   LYMPHSABS 1.0 04/29/2011 1856   MONOABS 0.8 04/29/2011 1856   EOSABS 0.1 04/29/2011 1856   BASOSABS 0.0 04/29/2011 1856    Basic Metabolic Panel:    Component Value Date/Time   NA 134* 05/01/2011  0405   K 3.8 05/01/2011 0405   CL 101 05/01/2011 0405   CO2 22 05/01/2011 0405   BUN 14 05/01/2011 0405   CREATININE 1.27 05/01/2011 0405   GLUCOSE 87 05/01/2011 0405   CALCIUM 9.1 05/01/2011 0405    Hospital Course:  Principal Problem:  *Rectal bleeding - patient is status post colonoscopy done 05/01/2011; there was no evidence of old or new blood on colonoscopy. Patient is status post 1 unit of red blood cell transfusion with hemoglobin around baseline level of 9.7 ; Patient will followup with GI should there be any other episodes of bloody stool  Active Problems:  Systolic and diastolic CHF, acute on chronic - there was a slight elevation of BNP on discharge, patient can resume taking same home medications (lasix 80 mg daily); I spoke with patient's PCP and was told that the patient is very noncompliant with his medications as well as follow up appointments. I have resumed all patient medications and have discussed with him about the importance of taking them. He verbalized understanding and said that he will follow up with her primary care physician Monday, 05/05/2011   Diabetes mellitus - continue glipizide 5 mg daily   Chronic kidney disease - creatinine stable   Essential hypertension, benign - BP 122/90, at goal; continue home medications   DVT (deep venous thrombosis) - status post IVC filter placement, patient is not on anticoagulation secondary to gastric AVMs  Education  - patient is aware of plan of care and treatment     Time spent on Discharge: Greater than 30 minutes  Signed: Maris Abascal 05/02/2011, 9:12 AM

## 2011-05-12 ENCOUNTER — Emergency Department (HOSPITAL_COMMUNITY)
Admission: EM | Admit: 2011-05-12 | Discharge: 2011-05-12 | Disposition: A | Discharge status: Home / Self Care | Payer: PRIVATE HEALTH INSURANCE | Attending: Emergency Medicine | Admitting: Emergency Medicine

## 2011-05-12 ENCOUNTER — Encounter (HOSPITAL_COMMUNITY): Payer: Self-pay | Admitting: *Deleted

## 2011-05-12 ENCOUNTER — Other Ambulatory Visit: Payer: Self-pay

## 2011-05-12 ENCOUNTER — Emergency Department (HOSPITAL_COMMUNITY): Payer: PRIVATE HEALTH INSURANCE

## 2011-05-12 DIAGNOSIS — E785 Hyperlipidemia, unspecified: Secondary | ICD-10-CM | POA: Insufficient documentation

## 2011-05-12 DIAGNOSIS — M7989 Other specified soft tissue disorders: Secondary | ICD-10-CM | POA: Insufficient documentation

## 2011-05-12 DIAGNOSIS — R609 Edema, unspecified: Secondary | ICD-10-CM | POA: Insufficient documentation

## 2011-05-12 DIAGNOSIS — K219 Gastro-esophageal reflux disease without esophagitis: Secondary | ICD-10-CM | POA: Insufficient documentation

## 2011-05-12 DIAGNOSIS — R0602 Shortness of breath: Secondary | ICD-10-CM | POA: Insufficient documentation

## 2011-05-12 DIAGNOSIS — I2510 Atherosclerotic heart disease of native coronary artery without angina pectoris: Secondary | ICD-10-CM | POA: Insufficient documentation

## 2011-05-12 DIAGNOSIS — Z794 Long term (current) use of insulin: Secondary | ICD-10-CM | POA: Insufficient documentation

## 2011-05-12 DIAGNOSIS — E119 Type 2 diabetes mellitus without complications: Secondary | ICD-10-CM | POA: Insufficient documentation

## 2011-05-12 DIAGNOSIS — R000 Tachycardia, unspecified: Secondary | ICD-10-CM | POA: Insufficient documentation

## 2011-05-12 DIAGNOSIS — N182 Chronic kidney disease, stage 2 (mild): Secondary | ICD-10-CM | POA: Insufficient documentation

## 2011-05-12 DIAGNOSIS — I129 Hypertensive chronic kidney disease with stage 1 through stage 4 chronic kidney disease, or unspecified chronic kidney disease: Secondary | ICD-10-CM | POA: Insufficient documentation

## 2011-05-12 DIAGNOSIS — Z79899 Other long term (current) drug therapy: Secondary | ICD-10-CM | POA: Insufficient documentation

## 2011-05-12 DIAGNOSIS — I509 Heart failure, unspecified: Secondary | ICD-10-CM | POA: Insufficient documentation

## 2011-05-12 LAB — POCT I-STAT TROPONIN I: Troponin i, poc: 0.03 ng/mL (ref 0.00–0.08)

## 2011-05-12 LAB — DIFFERENTIAL
Basophils Relative: 1 % (ref 0–1)
Eosinophils Relative: 3 % (ref 0–5)
Lymphocytes Relative: 36 % (ref 12–46)
Monocytes Absolute: 0.7 10*3/uL (ref 0.1–1.0)
Neutrophils Relative %: 42 % — ABNORMAL LOW (ref 43–77)

## 2011-05-12 LAB — CBC
Hemoglobin: 10 g/dL — ABNORMAL LOW (ref 13.0–17.0)
MCH: 18.9 pg — ABNORMAL LOW (ref 26.0–34.0)
Platelets: 204 10*3/uL (ref 150–400)
RBC: 5.3 MIL/uL (ref 4.22–5.81)
WBC: 3.7 10*3/uL — ABNORMAL LOW (ref 4.0–10.5)

## 2011-05-12 LAB — PROTIME-INR: Prothrombin Time: 18.2 seconds — ABNORMAL HIGH (ref 11.6–15.2)

## 2011-05-12 LAB — PRO B NATRIURETIC PEPTIDE: Pro B Natriuretic peptide (BNP): 2830 pg/mL — ABNORMAL HIGH (ref 0–125)

## 2011-05-12 LAB — BASIC METABOLIC PANEL
Chloride: 105 mEq/L (ref 96–112)
GFR calc Af Amer: 69 mL/min — ABNORMAL LOW (ref >90)
GFR calc non Af Amer: 60 mL/min — ABNORMAL LOW (ref >90)
Potassium: 4.4 mEq/L (ref 3.5–5.1)
Sodium: 137 mEq/L (ref 135–145)

## 2011-05-12 MED ORDER — FUROSEMIDE 10 MG/ML IJ SOLN
80.0000 mg | Freq: Once | INTRAMUSCULAR | Status: AC
  Administered 2011-05-12: 80 mg via INTRAVENOUS

## 2011-05-12 MED ORDER — FUROSEMIDE 40 MG PO TABS: 40.0000 mg | ORAL_TABLET | Freq: Every day | ORAL | Status: DC

## 2011-05-12 MED ORDER — FUROSEMIDE 10 MG/ML IJ SOLN
INTRAMUSCULAR | Status: AC
  Filled 2011-05-12: qty 4

## 2011-05-12 MED ORDER — FUROSEMIDE 10 MG/ML IJ SOLN
INTRAMUSCULAR | Status: AC
  Filled 2011-05-12: qty 8

## 2011-05-12 MED ORDER — FUROSEMIDE 10 MG/ML IJ SOLN
40.0000 mg | Freq: Once | INTRAMUSCULAR | Status: AC
  Administered 2011-05-12: 40 mg via INTRAVENOUS

## 2011-05-12 NOTE — ED Provider Notes (Signed)
History     CSN: [id number]  Arrival date & time 05/12/11  1518   First MD Initiated Contact with Patient 05/12/11 1522      Chief Complaint  Patient presents with  . Shortness of Breath    (Consider location/radiation/quality/duration/timing/severity/associated sxs/prior treatment) HPI This 65 year old male has a few weeks gradual onset shortness of breath worse over the last 2 weeks just prior to and after his blood transfusion for a lower GI bleed, he's been short of breath persistently since he was discharged just over a week ago he has no fever cough chest pain and no change in edema or orthopnea he has no weight gain just exertional shortness of breath with 10-20 feet of walking which is unchanged in the last one to 2 weeks now.  Past Medical History  Diagnosis Date  . Venous thromboembolism     IVC filter 2005  . 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 Chronic venous insufficiency   . DM2 (diabetes mellitus, type 2)   . GERD (gastroesophageal reflux disease)   . BPH (benign prostatic hypertrophy)   . Low back pain   . HTN (hypertension)   . HLD (hyperlipidemia)   . NICM (nonischemic cardiomyopathy) 1. 9/11  2. 2/12    1. Echo septal and apical akinesis, dilated LV, EF 45%, RV normal size and systolic function, EF 43% on Myoview  2. Admitted with decompensated CHF, echo EF 30-35% with diffuse hypokinesis but akinesis of mid to apical anteroseptal wall and apex, moderate LVH, mild MR, grade I diastolic dysfunction, SPEP/UP negative and HIV negative  . Smoking   . CAD (coronary artery disease) 1. 2007  2. 10/11    1. Left heart cath with 40-50% stenosis in small RCA EF 50%  2. Lexiscan myoview EF 43%, global hypokinesis, possible small area of apical ischemia; LHC no angiographic CAD, no LV-gram done due to CKD    . CKD (chronic kidney disease) stage 2, GFR 60-89 ml/min as of 02/2011  . Headache   . Diabetes mellitus   . Blood transfusion   . Anemia      Past Surgical History  Procedure Date  . Cardiac catheterization   . Appendectomy   . Cholecystectomy   . Colonoscopy 05/01/2011    Procedure: COLONOSCOPY;  Surgeon: Louis Meckel, MD;  [facility] ENDOSCOPY;  Service: Endoscopy;  Laterality: N/A;    Family History  Problem Relation Age of Onset  . Coronary artery disease Neg Hx     Premature    History  Substance Use Topics  . Smoking status: Current Everyday Smoker -- 1.0 packs/day for 40 years    Types: Cigarettes  . Smokeless tobacco: Never Used  . Alcohol Use: No      Review of Systems  Constitutional: Negative for fever.       10 Systems reviewed and are negative for acute change except as noted in the HPI.  HENT: Negative for congestion.   Eyes: Negative for discharge and redness.  Respiratory: Positive for shortness of breath. Negative for cough.   Cardiovascular: Positive for leg swelling. Negative for chest pain.  Gastrointestinal: Negative for vomiting and abdominal pain.  Musculoskeletal: Negative for back pain.  Skin: Negative for rash.  Neurological: Negative for syncope, numbness and headaches.  Psychiatric/Behavioral:       No behavior change.    Allergies  Review of patient's allergies indicates no known allergies.  Home Medications   Current  Outpatient Rx  Name Route Sig Dispense Refill  . ATORVASTATIN CALCIUM 20 MG PO TABS Oral Take 20 mg by mouth daily.      Marland Kitchen CIPROFLOXACIN HCL 250 MG PO TABS Oral Take 250 mg by mouth daily.      Marland Kitchen FLUCONAZOLE 100 MG PO TABS Oral Take 100 mg by mouth daily.      Marland Kitchen GLIPIZIDE ER 5 MG PO TB24 Oral Take 5 mg by mouth daily.      . IBUPROFEN 200 MG PO TABS Oral Take 400 mg by mouth every 8 (eight) hours as needed. For pain     . INSULIN GLARGINE 100 UNIT/ML Laurium SOLN Subcutaneous Inject 20 Units into the skin at bedtime.     Marland Kitchen LANSOPRAZOLE 30 MG PO CPDR Oral Take 30 mg by mouth daily.      Marland Kitchen LOSARTAN POTASSIUM 100 MG PO TABS Oral Take 1 tablet (100 mg  total) by mouth daily. 30 tablet 11  . METOPROLOL SUCCINATE ER 100 MG PO TB24 Oral Take 100 mg by mouth daily.     Marland Kitchen NAPHAZOLINE HCL 0.012 % OP SOLN Both Eyes Place 2 drops into both eyes at bedtime as needed. For dry/itchy eyes     . FUROSEMIDE 40 MG PO TABS Oral Take 1 tablet (40 mg total) by mouth daily. Take two tabs by mouth once daily next three days for a total dose of 80mg  each day, then go back to 40mg  once daily 30 tablet 0    BP 132/89  Pulse 106  Temp(Src) 97.7 F (36.5 C) (Oral)  Resp 20  SpO2 95%  Physical Exam  Nursing note and vitals reviewed. Constitutional:       Awake, alert, nontoxic appearance.  HENT:  Head: Atraumatic.  Eyes: Right eye exhibits no discharge. Left eye exhibits no discharge.  Neck: Neck supple.  Cardiovascular: Regular rhythm.   No murmur heard.      Tachycardic  Pulmonary/Chest: Effort normal. He has no wheezes. He has rales. He exhibits no tenderness.       Bibasilar crackles  Abdominal: Soft. Bowel sounds are normal. There is no tenderness. There is no rebound.  Musculoskeletal: He exhibits edema. He exhibits no tenderness.       Baseline ROM, no obvious new focal weakness. The patient states his mild to moderate edema of his lower legs is baseline for him with no evidence of acute cellulitis  Neurological: He is alert.       Mental status and motor strength appears baseline for patient and situation.  Skin: No rash noted.  Psychiatric: He has a normal mood and affect.    ED Course  Procedures (including critical care time) This 65 year old male has a few weeks gradual onset shortness of breath worse over the last 2 weeks just prior to and after his blood transfusion for a lower GI bleed, he's been short of breath persistently since he was discharged just over a week ago he has no fever cough chest pain and no change in edema or orthopnea he has no weight gain just exertional shortness of breath with 10-20 feet of walking which is unchanged  in the last one to 2 weeks now. His examination shows stable lower extremity mild edema and bibasilar crackles with her her pulse oximetry unknown since he was brought by EMS.  ECG: Sinus tachycardia, ventricular rate 112, normal axis, nonspecific T wave changes, no comparison ECG available at this time  Patient urinated at least in  the ED not including one spell of urine when he missed the urinal accidentally, d/w Cards for close f/u and Pt agrees. Labs Reviewed  BASIC METABOLIC PANEL - Abnormal; Notable for the following:    Glucose, Bld 137 (*)    GFR calc non Af Amer 60 (*)    GFR calc Af Amer 69 (*)    All other components within normal limits  PRO B NATRIURETIC PEPTIDE - Abnormal; Notable for the following:    Pro B Natriuretic peptide (BNP) 2830.0 (*)    All other components within normal limits  CBC - Abnormal; Notable for the following:    WBC 3.7 (*)    Hemoglobin 10.0 (*)    HCT 36.0 (*)    MCV 67.9 (*)    MCH 18.9 (*)    MCHC 27.8 (*)    RDW 23.8 (*)    All other components within normal limits  DIFFERENTIAL - Abnormal; Notable for the following:    Neutrophils Relative 42 (*)    Monocytes Relative 18 (*)    Neutro Abs 1.6 (*)    All other components within normal limits  PROTIME-INR - Abnormal; Notable for the following:    Prothrombin Time 18.2 (*)    All other components within normal limits  POCT I-STAT TROPONIN I   Dg Chest 2 View  05/12/2011  *RADIOLOGY REPORT*  Clinical Data: Very short of breath  CHEST - 2 VIEW  Comparison: Chest x-ray of 03/15/2011  Findings: There is moderate cardiomegaly present with mild pulmonary vascular congestion and effusions, most consistent with congestive heart failure.  Pneumonia at the lung bases would be difficult to exclude.  No bony abnormality is seen.  Surgical clips are present in the right upper quadrant from prior cholecystectomy.  IMPRESSION: Cardiomegaly, effusions, and mild congestion most consistent with  congestive heart failure.  Original Report Authenticated By: Juline Patch, M.D.     1. Heart failure       MDM  I doubt any other EMC precluding discharge at this time including, but not necessarily limited to the following:ACS.        Hurman Horn, MD 05/13/11 Lyda Jester

## 2011-05-12 NOTE — ED Notes (Signed)
Pt has history of dvts to right leg and had to stop coumadin after being hospitalized last week for a gi bleed.  PT got up to bathroom today and became short of breath.  Pt appears tachypneic  In the 20s and is talking in complete sentences.    Pt has swelling to BLE and the right is greater than the left. Pulses palpable.  Pt has swelling to abd.  Pt has expiratory wheezes and diminished lung sounds bilaterally

## 2011-05-14 ENCOUNTER — Telehealth: Payer: Self-pay

## 2011-05-14 DIAGNOSIS — R0602 Shortness of breath: Secondary | ICD-10-CM

## 2011-05-14 NOTE — Telephone Encounter (Signed)
Addended by: Meda Klinefelter D on: 05/14/2011 01:45 PM   Modules accepted: Orders

## 2011-05-14 NOTE — Telephone Encounter (Signed)
Patient was called and told our office will be calling him to schedule a echo.

## 2011-05-14 NOTE — Telephone Encounter (Signed)
Would go ahead and repeat echocardiogram prior to seeing Scott.

## 2011-05-14 NOTE — Telephone Encounter (Signed)
Patient walked in office stated he just saw PCP Dr.Betty Pecola Leisure.Stated she wants him to have U/S of heart scheduled.Stated he has swelling in his legs and feet,and is sob.Stated he was discharged from hospital 05/02/11.Appointment scheduled with Tereso Newcomer PA 05/28/11 at 2:30 pm.

## 2011-05-15 ENCOUNTER — Telehealth: Payer: Self-pay | Admitting: Cardiology

## 2011-05-15 NOTE — Telephone Encounter (Signed)
Follow-up:    Patient came into the office yesterday to see Dr. Shirlee Latch or his nurse about the fluid building up around his lungs, but they were unavailable.  He spoke to one of the nurses on staff instead and was scheduled to have an ECHO on 12/31 and to see Scott on 05/28/11.  He still has some questions about the fluid around his lungs because he is having a hard time breathing.

## 2011-05-15 NOTE — Telephone Encounter (Signed)
Discussed pt issues with dr Elease Hashimoto (dod), pt instructed to take the doubled dose of lasix as instructed for the next three days and he should get some relief. He was instructed to watch his salt intake and fluids. He will have his echo on the 31st as scheduled. He will call with cont problems

## 2011-05-15 NOTE — Telephone Encounter (Signed)
Spoke with pt, he is c/o SOB with little exertion. He is lying flat talking with me on the phone and states it is alittle harder to breath. He was seen in the ER and CXR confirmed CHF. nHe reports his weight is stable, he does have edema in his legs and abd that has not changed but he thinks his abdomen is tighter. His primary care doubled his lasix and he took the first dosage of that today. Will discuss with DOD dr Elease Hashimoto.

## 2011-05-16 ENCOUNTER — Telehealth: Payer: Self-pay | Admitting: Cardiology

## 2011-05-16 NOTE — Telephone Encounter (Signed)
New Msg: Pt calling wanting to speak to nurse regarding pt continuous dry mouth. Please return pt call to discuss further.

## 2011-05-16 NOTE — Telephone Encounter (Signed)
I spoke with the patient about his mouth being dry. This has just occurred today, but is very bothersome to him. I explained that with being on more than one diuretic, that he may be a little bit dry, and this may be contributing to his symptoms. He is also diabetic, but states his sugars have been under control. He does not weigh daily at home. I have dicussed the importance of daily weights at home for him. He is very upset that he fluid can not get under control. He states he has had no change in his diet. He is already scheduled for an echo on Monday 05/19/11. He is demanding to be seen after the echo to discuss results and what can be done for his fluid status. I have explained to him that we will put him on the PA schedule after his echo is done. I have also advised that he check his weight at home and if his weight is up 3 lbs or more in 24 hours, or if he feels worse, he should go the the ER this weekend. I have spoken to Lela in scheduling and she will make the patient's appointment  for after his echo on Monday.

## 2011-05-19 ENCOUNTER — Inpatient Hospital Stay (HOSPITAL_COMMUNITY)
Admission: AD | Admit: 2011-05-19 | Discharge: 2011-05-23 | DRG: 292 | Disposition: A | Discharge status: Home / Self Care | Payer: PRIVATE HEALTH INSURANCE | Source: Ambulatory Visit | Attending: Internal Medicine | Admitting: Internal Medicine

## 2011-05-19 ENCOUNTER — Encounter: Payer: Self-pay | Admitting: Physician Assistant

## 2011-05-19 ENCOUNTER — Encounter (HOSPITAL_COMMUNITY): Payer: Self-pay | Admitting: *Deleted

## 2011-05-19 ENCOUNTER — Ambulatory Visit (INDEPENDENT_AMBULATORY_CARE_PROVIDER_SITE_OTHER): Payer: PRIVATE HEALTH INSURANCE | Admitting: Physician Assistant

## 2011-05-19 ENCOUNTER — Ambulatory Visit (HOSPITAL_BASED_OUTPATIENT_CLINIC_OR_DEPARTMENT_OTHER): Payer: PRIVATE HEALTH INSURANCE | Admitting: Radiology

## 2011-05-19 VITALS — BP 122/94 | HR 114 | Resp 20 | Ht 71.0 in | Wt 126.1 lb

## 2011-05-19 DIAGNOSIS — I10 Essential (primary) hypertension: Secondary | ICD-10-CM | POA: Insufficient documentation

## 2011-05-19 DIAGNOSIS — E119 Type 2 diabetes mellitus without complications: Secondary | ICD-10-CM

## 2011-05-19 DIAGNOSIS — I82409 Acute embolism and thrombosis of unspecified deep veins of unspecified lower extremity: Secondary | ICD-10-CM | POA: Diagnosis present

## 2011-05-19 DIAGNOSIS — N400 Enlarged prostate without lower urinary tract symptoms: Secondary | ICD-10-CM | POA: Diagnosis present

## 2011-05-19 DIAGNOSIS — F17200 Nicotine dependence, unspecified, uncomplicated: Secondary | ICD-10-CM | POA: Diagnosis present

## 2011-05-19 DIAGNOSIS — I5022 Chronic systolic (congestive) heart failure: Secondary | ICD-10-CM

## 2011-05-19 DIAGNOSIS — I129 Hypertensive chronic kidney disease with stage 1 through stage 4 chronic kidney disease, or unspecified chronic kidney disease: Secondary | ICD-10-CM | POA: Diagnosis present

## 2011-05-19 DIAGNOSIS — R609 Edema, unspecified: Secondary | ICD-10-CM

## 2011-05-19 DIAGNOSIS — R0602 Shortness of breath: Secondary | ICD-10-CM

## 2011-05-19 DIAGNOSIS — E876 Hypokalemia: Secondary | ICD-10-CM | POA: Diagnosis not present

## 2011-05-19 DIAGNOSIS — I509 Heart failure, unspecified: Secondary | ICD-10-CM | POA: Diagnosis present

## 2011-05-19 DIAGNOSIS — I5023 Acute on chronic systolic (congestive) heart failure: Principal | ICD-10-CM | POA: Diagnosis present

## 2011-05-19 DIAGNOSIS — I428 Other cardiomyopathies: Secondary | ICD-10-CM | POA: Diagnosis present

## 2011-05-19 DIAGNOSIS — Z79899 Other long term (current) drug therapy: Secondary | ICD-10-CM

## 2011-05-19 DIAGNOSIS — E785 Hyperlipidemia, unspecified: Secondary | ICD-10-CM | POA: Diagnosis present

## 2011-05-19 DIAGNOSIS — I2510 Atherosclerotic heart disease of native coronary artery without angina pectoris: Secondary | ICD-10-CM | POA: Diagnosis present

## 2011-05-19 DIAGNOSIS — K7460 Unspecified cirrhosis of liver: Secondary | ICD-10-CM

## 2011-05-19 DIAGNOSIS — K31819 Angiodysplasia of stomach and duodenum without bleeding: Secondary | ICD-10-CM

## 2011-05-19 DIAGNOSIS — N189 Chronic kidney disease, unspecified: Secondary | ICD-10-CM

## 2011-05-19 DIAGNOSIS — Q2733 Arteriovenous malformation of digestive system vessel: Secondary | ICD-10-CM

## 2011-05-19 DIAGNOSIS — K219 Gastro-esophageal reflux disease without esophagitis: Secondary | ICD-10-CM | POA: Diagnosis present

## 2011-05-19 DIAGNOSIS — I5043 Acute on chronic combined systolic (congestive) and diastolic (congestive) heart failure: Secondary | ICD-10-CM

## 2011-05-19 DIAGNOSIS — M545 Low back pain, unspecified: Secondary | ICD-10-CM | POA: Diagnosis present

## 2011-05-19 DIAGNOSIS — D631 Anemia in chronic kidney disease: Secondary | ICD-10-CM | POA: Diagnosis present

## 2011-05-19 DIAGNOSIS — Z794 Long term (current) use of insulin: Secondary | ICD-10-CM

## 2011-05-19 DIAGNOSIS — Z86718 Personal history of other venous thrombosis and embolism: Secondary | ICD-10-CM

## 2011-05-19 LAB — CBC
HCT: 36.3 % — ABNORMAL LOW (ref 39.0–52.0)
Hemoglobin: 10.4 g/dL — ABNORMAL LOW (ref 13.0–17.0)
MCH: 19.3 pg — ABNORMAL LOW (ref 26.0–34.0)
MCV: 67.3 fL — ABNORMAL LOW (ref 78.0–100.0)
Platelets: 198 10*3/uL (ref 150–400)
RBC: 5.39 MIL/uL (ref 4.22–5.81)

## 2011-05-19 LAB — PROTIME-INR
INR: 1.6 — ABNORMAL HIGH (ref 0.00–1.49)
Prothrombin Time: 19.3 seconds — ABNORMAL HIGH (ref 11.6–15.2)

## 2011-05-19 LAB — COMPREHENSIVE METABOLIC PANEL
Alkaline Phosphatase: 441 U/L — ABNORMAL HIGH (ref 39–117)
BUN: 15 mg/dL (ref 6–23)
CO2: 19 mEq/L (ref 19–32)
Chloride: 103 mEq/L (ref 96–112)
Creatinine, Ser: 1.24 mg/dL (ref 0.50–1.35)
GFR calc non Af Amer: 60 mL/min — ABNORMAL LOW (ref >90)
Total Bilirubin: 2.9 mg/dL — ABNORMAL HIGH (ref 0.3–1.2)

## 2011-05-19 LAB — DIFFERENTIAL
Basophils Relative: 2 % — ABNORMAL HIGH (ref 0–1)
Eosinophils Relative: 4 % (ref 0–5)
Lymphs Abs: 1 10*3/uL (ref 0.7–4.0)
Monocytes Absolute: 0.6 10*3/uL (ref 0.1–1.0)
Neutro Abs: 1.4 10*3/uL — ABNORMAL LOW (ref 1.7–7.7)

## 2011-05-19 LAB — GLUCOSE, CAPILLARY: Glucose-Capillary: 54 mg/dL — ABNORMAL LOW (ref 70–99)

## 2011-05-19 MED ORDER — PANTOPRAZOLE SODIUM 40 MG PO TBEC
40.0000 mg | DELAYED_RELEASE_TABLET | Freq: Every day | ORAL | Status: DC
Start: 2011-05-19 — End: 2011-05-23
  Administered 2011-05-20 – 2011-05-23 (×4): 40 mg via ORAL
  Filled 2011-05-19 (×5): qty 1

## 2011-05-19 MED ORDER — METOPROLOL SUCCINATE ER 100 MG PO TB24
100.0000 mg | ORAL_TABLET | Freq: Every day | ORAL | Status: DC
  Administered 2011-05-20 – 2011-05-23 (×4): 100 mg via ORAL
  Filled 2011-05-19 (×5): qty 1

## 2011-05-19 MED ORDER — LOSARTAN POTASSIUM 50 MG PO TABS: 100.0000 mg | ORAL_TABLET | Freq: Every day | ORAL | Status: DC

## 2011-05-19 MED ORDER — NAPHAZOLINE-GLYCERIN 0.012-0.2 % OP SOLN
2.0000 [drp] | Freq: Every evening | OPHTHALMIC | Status: DC | PRN
  Filled 2011-05-19: qty 15

## 2011-05-19 MED ORDER — POTASSIUM CHLORIDE CRYS ER 20 MEQ PO TBCR
20.0000 meq | EXTENDED_RELEASE_TABLET | Freq: Two times a day (BID) | ORAL | Status: DC
  Administered 2011-05-19 – 2011-05-23 (×8): 20 meq via ORAL
  Filled 2011-05-19 (×9): qty 1

## 2011-05-19 MED ORDER — SODIUM CHLORIDE 0.9 % IJ SOLN: 3.0000 mL | INTRAMUSCULAR | Status: DC | PRN

## 2011-05-19 MED ORDER — FUROSEMIDE 80 MG PO TABS
80.0000 mg | ORAL_TABLET | Freq: Once | ORAL | Status: AC
  Administered 2011-05-19: 80 mg via ORAL
  Filled 2011-05-19 (×2): qty 1

## 2011-05-19 MED ORDER — SODIUM CHLORIDE 0.9 % IV SOLN
250.0000 mL | INTRAVENOUS | Status: DC | PRN
  Administered 2011-05-21: 250 mL via INTRAVENOUS

## 2011-05-19 MED ORDER — SODIUM CHLORIDE 0.9 % IJ SOLN
3.0000 mL | Freq: Two times a day (BID) | INTRAMUSCULAR | Status: DC
  Administered 2011-05-20 – 2011-05-23 (×5): 3 mL via INTRAVENOUS

## 2011-05-19 MED ORDER — FUROSEMIDE 10 MG/ML IJ SOLN
80.0000 mg | Freq: Two times a day (BID) | INTRAMUSCULAR | Status: DC
  Filled 2011-05-19 (×4): qty 8

## 2011-05-19 MED ORDER — ONDANSETRON HCL 4 MG/2ML IJ SOLN: 4.0000 mg | Freq: Four times a day (QID) | INTRAMUSCULAR | Status: DC | PRN

## 2011-05-19 MED ORDER — ACETAMINOPHEN 325 MG PO TABS
650.0000 mg | ORAL_TABLET | ORAL | Status: DC | PRN
  Administered 2011-05-20 – 2011-05-23 (×4): 650 mg via ORAL
  Filled 2011-05-19 (×4): qty 2

## 2011-05-19 MED ORDER — GLIPIZIDE ER 5 MG PO TB24
5.0000 mg | ORAL_TABLET | Freq: Every day | ORAL | Status: DC
  Administered 2011-05-20 – 2011-05-23 (×4): 5 mg via ORAL
  Filled 2011-05-19 (×6): qty 1

## 2011-05-19 MED ORDER — ENOXAPARIN SODIUM 40 MG/0.4ML ~~LOC~~ SOLN
40.0000 mg | SUBCUTANEOUS | Status: DC
  Filled 2011-05-19 (×5): qty 0.4

## 2011-05-19 MED ORDER — INSULIN GLARGINE 100 UNIT/ML ~~LOC~~ SOLN
20.0000 [IU] | Freq: Every day | SUBCUTANEOUS | Status: DC
  Administered 2011-05-19 – 2011-05-22 (×4): 20 [IU] via SUBCUTANEOUS
  Filled 2011-05-19: qty 3

## 2011-05-19 MED ORDER — NAPHAZOLINE HCL 0.012 % OP SOLN: 2.0000 [drp] | Freq: Every evening | OPHTHALMIC | Status: DC | PRN

## 2011-05-19 MED ORDER — LOSARTAN POTASSIUM 50 MG PO TABS
100.0000 mg | ORAL_TABLET | Freq: Every day | ORAL | Status: DC
  Administered 2011-05-20 – 2011-05-23 (×4): 100 mg via ORAL
  Filled 2011-05-19 (×5): qty 2

## 2011-05-19 MED ORDER — ROSUVASTATIN CALCIUM 20 MG PO TABS
20.0000 mg | ORAL_TABLET | Freq: Every day | ORAL | Status: DC
  Administered 2011-05-20 – 2011-05-22 (×3): 20 mg via ORAL
  Filled 2011-05-19 (×5): qty 1

## 2011-05-19 NOTE — Assessment & Plan Note (Signed)
As noted, he is currently off of his Coumadin due to recent bleeding.

## 2011-05-19 NOTE — Assessment & Plan Note (Signed)
This was felt to be passive congestion and likely related to chronic heart failure previously.  Depending upon his progress, he may need gastroenterology to see him again while hospitalized.

## 2011-05-19 NOTE — Assessment & Plan Note (Signed)
Continue current therapy and cover with sliding scale insulin while admitted.

## 2011-05-19 NOTE — Assessment & Plan Note (Signed)
He is currently off of Coumadin secondary to recent GI bleed.  He will be placed on DVT dose Lovenox.

## 2011-05-19 NOTE — H&P (Addendum)
Admission History and Physical  Date: 05/19/2011   Name: [name]  DOB: 07-23-1945  MRN: [id number]   PCP: Dr. Pecola Leisure  Primary Cardiologist: Dr. Marca Ancona  Primary Electrophysiologist: None   History of Present Illness:  [name] is a 65 y.o. male who presents for a complaint of dyspnea and swelling. He has a history of non obstructive CAD, NICM, systolic CHF, duodenal AVM with prior UGI bleed, multiple DVTs, labile HTN, and diabetes. He has a long history of DVTs, most recently in 8/10, with resulting chronic venous insufficiency. He has an IVC filter. He had a workup this past fall for exertional dypsnea showing mild nonischemic cardiomyopathy with no coronary disease on cath and EF 45% on echo. Patient was then admitted on 07/05/10 with about 10 days of steadily worsening exertional dyspnea. No definite trigger. He reported medication compliance. He was noted to be very volume overloaded and was admitted and diuresed. Echo showed worse LV systolic function compared to the prior study last fall. EF was 30-35% with moderate LVH. Ultrasound in the hospital showed no recurrent lower extremity DVT.   He was last seen by Dr. Marca Ancona in 5/12 with plans to follow up in 4 mos. Since then, he was admitted 12/11-12/14 with rectal bleeding. Colo failed to demonstrate new bleeding. He did require txn with PRBCs. He was seen by GI. CT of abdomen demonstrated findings of cirrhosis with mod ascites and anasarca. Dr. Arlyce Dice noted that he felt this was likely related to chronic passive congestion and cardiac disease. He was noted to have edema on his CXR. The notes indicate a h/o non-compliance with meds. He apparently had his diuretics restarted. He then presented to the ED 12/24 with CHF. BNP 2830, K 4.4, creatinine 1.25, INR 1.48, troponin negative, Hgb 10. He was given Lasix 40 mg IV then 80 mg IV and given a Rx to take home with him. He called in with concerns about edema and that his PCP  wanted an echo. This was arranged for today and he is added on to my schedule.   He notes problems with increased swelling and shortness of breath over the last 2 months. He describes NYHA class IIIB-4 symptoms. He notes orthopnea. He denies PND. He notes massive swelling in his extremities up to his thighs as well as his abdomen and scrotum. Echo done earlier today demonstrates EF 10-15%. Of note, he states coumadin was stopped during his last admission and his PCP is managing this.   Past Medical History:  1. 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 IVC filter placed in 2005. Most recent DVT was in right leg in 8/11. Now back on coumadin.  2. Duodenal AVM with GI bleed  3. 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 Diabetes mellitus type II  5. Smoking  6. GERD  7. BPH  8. Low back pain  9. HTN  10. CAD: Left heart cath 2007 with 40-50% stenosis in a small RCA, EF 50%. Echo (9/11): Septal and apical akinesis, dilated LV, EF 45%, RV normal size and systolic function. Lexiscan myoview (10/11): EF 43%, global hypokinesis, possible small area of apical ischemia. LHC (10/11): No angiographic CAD. No LV-gram done due to CKD.  11. Hyperlipidemia  12. Nonischemic CMP: Echo (9/11) with septal and apical akinesis, dilated LV, EF 45%, RV normal size and systolic function. EF 43% on myoview. Patient was admitted with decompensated CHF in 2/12. Echo (2/12) showed EF 30-35% with diffuse  hypokinesis but akinesis of the mid to apical anteroseptal wall and apex, moderate LVH, mild MR, grade I diastolic dysfunction. SPEP/UPEP negative and HIV negative in 2/12.  13. CKD   Current Outpatient Prescriptions   Medication  Sig  Dispense  Refill   .  atorvastatin (LIPITOR) 20 MG tablet  Take 20 mg by mouth daily.     .  furosemide (LASIX) 40 MG tablet  Take 1 tablet (40 mg total) by mouth daily. Take two tabs by mouth once daily next three days for a total dose of 80mg  each day, then go back to  40mg  once daily  30 tablet  0   .  glipiZIDE (GLUCOTROL XL) 5 MG 24 hr tablet  Take 5 mg by mouth daily.     Marland Kitchen  ibuprofen (ADVIL,MOTRIN) 200 MG tablet  Take 400 mg by mouth every 8 (eight) hours as needed. For pain     .  insulin glargine (LANTUS) 100 UNIT/ML injection  Inject 20 Units into the skin at bedtime.     .  lansoprazole (PREVACID) 30 MG capsule  Take 30 mg by mouth daily.     Marland Kitchen  losartan (COZAAR) 100 MG tablet  Take 1 tablet (100 mg total) by mouth daily.  30 tablet  11   .  metoprolol (TOPROL-XL) 100 MG 24 hr tablet  Take 100 mg by mouth daily.     .  naphazoline (CLEAR EYES) 0.012 % ophthalmic solution  Place 2 drops into both eyes at bedtime as needed. For dry/itchy eyes      Allergies:  No Known Allergies  History   Substance Use Topics   .  Smoking status:  Current Everyday Smoker -- 1.0 packs/day for 40 years     Types:  Cigarettes   .  Smokeless tobacco:  Never Used   .  Alcohol Use:  No    ROS: Please see the history of present illness. All other systems reviewed and negative.   PHYSICAL EXAM:  VS: BP 122/94  Pulse 114  Resp 20  Ht 5\' 11"  (1.803 m)  Wt 126 lb 1.9 oz (57.208 kg)  BMI 17.59 kg/m2  Well nourished, well developed, in no acute distress  HEENT: normal  Neck: + JVD to jaw at 90 degrees  Cardiac: normal S1, S2; RRR; 2/6 systolic murmur along LLSB  Lungs: Decreased breath sounds bilaterally, no wheezing, rhonchi or rales, + egophony at the bases  Abd: hard, distended, diffusely tender  Ext: tight 2-3+ bilateral edema to upper thighs  Back: + presacral edema  Skin: warm and dry  Neuro: CNs 2-12 intact, no focal abnormalities noted  Psych: normal affect   EKG: Sinus tachycardia, HR 109, normal axis, PRWP, NSSTTW changes   Echocardiogram 05/19/11:  Study Conclusions  - Left ventricle: The cavity size was normal. There was moderate asymmetric hypertrophy of the septum. The estimated ejection fraction was in the range of 10% to 15%. Diffuse  hypokinesis. Although no diagnostic regional wall motion abnormality was identified, this possibility cannot be completely excluded on the basis of this study. Features are consistent with a pseudonormal left ventricular filling pattern, with concomitant abnormal relaxation and increased filling pressure (grade 2 diastolic dysfunction). E/E'consistent withincreased left atrial pressure. - Ventricular septum: Septal motion showed abnormal function and dyssynergy. The contour showed diastolic flattening and systolic flattening. - Aortic valve: Trileaflet; mildly thickened leaflets. Trivial regurgitation. - Mitral valve: Mild to moderate regurgitation. - Left atrium: The atrium was mildly dilated. -  Right ventricle: The cavity size was mildly dilated. Systolic function was severely reduced. - Tricuspid valve: Moderate regurgitation. - Pulmonary arteries: Systolic pressure was mildly to moderately increased. PA peak pressure: 47mm Hg (S). - Inferior vena cava: The vessel was dilated; the respirophasic diameter changes were blunted (< 50%); findings are consistent with elevated central venous pressure. - Pericardium, extracardiac: A small pericardial effusion was identified posterior to the heart. There was a left pleural effusion.   Impressions:  - Further reduction in LVEF compared to prior study, now 10-15% with relatively diffuse hypokinesis suggestive of nonischemic cardiomyopathy. Moderate diastolic dysfunction with increased left atrial pressure. Mild to moderate mitral regurgitation and moderate tricuspid regurgitation. Moderate pulmonary hypertension. Small pericardial effusion noted. Also left pleural effusion.  Results communicated with Mr. Alben Spittle (assessing patient clinically in office) at 3:45 PM.   ASSESSMENT AND PLAN:   Systolic and diastolic CHF, acute on chronic - Tereso Newcomer, PA 05/19/2011 4:12 PM Addendum  He has massive volume overload. His ejection fraction today is  much worse than it has been previously. He also has significant RV dysfunction from pulmonary hypertension. His picture is likely that of biventricular failure. He needs admission for diuresis and adjustment of his CHF therapy. I discussed his case with Dr. Hillis Range who also saw the patient.  1. Admit to telemetry. 2. Start with Lasix 80 mg IV b.i.d. 3. Eventually, as his renal function allows, add spironolactone back to his medical regimen. 4. Consider adding isosorbide and hydralazine to his medical regimen as he improves and his blood pressure allows. 5. Consider changing Toprol to carvedilol. Toprol will be continued initially as he is already on this medication. 6. Consider abdominal ultrasound Wednesday 05/21/11. He may need paracentesis if he does not have effective diuresis. 7. He may need be advanced heart failure clinic to consult while he is an inpatient.  Previous Version  DVT (deep venous thrombosis) - Tereso Newcomer, PA 05/19/2011 4:12 PM Signed  He is currently off of Coumadin secondary to recent GI bleed. He will be placed on DVT dose Lovenox.  Gastric AVM - Tereso Newcomer, [place] 05/19/2011 4:13 PM Signed  As noted, he is currently off of his Coumadin due to recent bleeding.  Chronic kidney disease - Tereso Newcomer, [place] 05/19/2011 4:13 PM Signed  Monitor renal function and potassium closely with diuresis.  Diabetes mellitus - Tereso Newcomer, [place] 05/19/2011 4:13 PM Signed  Continue current therapy and cover with sliding scale insulin while admitted.  Cirrhosis - Tereso Newcomer, [place] 05/19/2011 4:14 PM Signed  This was felt to be passive congestion and likely related to chronic heart failure previously. Depending upon his progress, he may need gastroenterology to see him again while hospitalized.  Signed, Tereso Newcomer, PA-C  4:18 PM 05/19/2011    I have seen, examined the patient, and reviewed the above assessment and plan. Pt presents with worsening abdominal distension and lower  extremity edema.  + SOB.   Previously admitted with similar complaints. On exam, he is markedly volume overloaded. We will admit to [facility] for IV diuresis.  This will require several days of inpatient care.  We will follow his renal function closely and continue to optimize his medicine regimen as outlined above.  Co Sign: Hillis Range, MD 05/19/2011 9:10 PM

## 2011-05-19 NOTE — Assessment & Plan Note (Signed)
Monitor renal function and potassium closely with diuresis.

## 2011-05-19 NOTE — Assessment & Plan Note (Addendum)
He has massive volume overload.  His ejection fraction today is much worse than it has been previously.  He also has significant RV dysfunction from pulmonary hypertension.  His picture is likely that of biventricular failure.  He needs admission for diuresis and adjustment of his CHF therapy.  I discussed his case with Dr. Hillis Range who also saw the patient. 1. Admit to telemetry. 2. Start with Lasix 80 mg IV b.i.d. 3. Eventually, as his renal function allows, add spironolactone back to his medical regimen. 4. Consider adding isosorbide and hydralazine to his medical regimen as he improves and his blood pressure allows. 5. Consider changing Toprol to carvedilol.  Toprol will be continued initially as he is already on this medication. 6. Consider abdominal ultrasound Wednesday 05/21/11.  He may need paracentesis if he does not have effective diuresis. 7. He may need be advanced heart failure clinic to consult while he is an inpatient.

## 2011-05-19 NOTE — Plan of Care (Signed)
Problem: Phase I Progression Outcomes Goal: EF % per last Echo/documented,Core Reminder form on chart 10-15%

## 2011-05-19 NOTE — Progress Notes (Signed)
[address] [place], [place]  [address] Phone: [phone] Fax:  [phone]  Date:  05/19/2011   Name:  [name]       DOB:  10-27-1945 MRN:  [id number]  PCP:  Dr. Pecola Leisure Primary Cardiologist:  Dr. Marca Ancona  Primary Electrophysiologist:  None    History of Present Illness: [name] is a 65 y.o. male who presents for a complaint of dyspnea and swelling.  He has a history of non obstructive CAD, NICM, systolic CHF, duodenal AVM with prior UGI bleed, multiple DVTs, labile HTN, and diabetes. He has a long history of DVTs, most recently in 8/10, with resulting chronic venous insufficiency. He has an IVC filter. He had a workup this past fall for exertional dypsnea showing mild nonischemic cardiomyopathy with no coronary disease on cath and EF 45% on echo.  Patient was then admitted on 07/05/10 with about 10 days of steadily worsening exertional dyspnea. No definite trigger. He reported medication compliance. He was noted to be very volume overloaded and was admitted and diuresed. Echo showed worse LV systolic function compared to the prior study last fall. EF was 30-35% with moderate LVH. Ultrasound in the hospital showed no recurrent lower extremity DVT.    He was last seen by Dr. Marca Ancona in 5/12 with plans to follow up in 4 mos.  Since then, he was admitted 12/11-12/14 with rectal bleeding.  Colo failed to demonstrate new bleeding.  He did require txn with PRBCs.  He was seen by GI.  CT of abdomen demonstrated findings of cirrhosis with mod ascites and anasarca.  Dr. Arlyce Dice noted that he felt this was likely related to chronic passive congestion and cardiac disease.   He was noted to have edema on his CXR.  The notes indicate a h/o non-compliance with meds.  He apparently had his diuretics restarted.  He then presented to the ED 12/24 with CHF.  BNP 2830, K 4.4, creatinine 1.25, INR 1.48, troponin negative, Hgb 10.  He was given Lasix 40 mg IV then  80 mg IV and given a Rx to take home with him.  He called in with concerns about edema and that his PCP wanted an echo.  This was arranged for today and he is added on to my schedule.  He notes problems with increased swelling and shortness of breath over the last 2 months.  He describes NYHA class IIIB-4 symptoms.  He notes orthopnea.  He denies PND.  He notes massive swelling in his extremities up to his thighs as well as his abdomen and scrotum.  Echo done earlier today demonstrates EF 10-15%.  Of note, he states coumadin was stopped during his last admission and his PCP is managing this.  Past Medical History:  1. 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 IVC filter placed in 2005. Most recent DVT was in right leg in 8/11. Now back on coumadin.  2. Duodenal AVM with GI bleed  3. 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 Diabetes mellitus type II  5. Smoking  6. GERD  7. BPH  8. Low back pain  9. HTN  10. CAD: Left heart cath 2007 with 40-50% stenosis in a small RCA, EF 50%. Echo (9/11): Septal and apical akinesis, dilated LV, EF 45%, RV normal size and systolic function. Lexiscan myoview (10/11): EF 43%, global hypokinesis, possible small area of apical ischemia. LHC (10/11): No angiographic CAD. No LV-gram done due to  CKD.  11. Hyperlipidemia  12. Nonischemic CMP: Echo (9/11) with septal and apical akinesis, dilated LV, EF 45%, RV normal size and systolic function. EF 43% on myoview. Patient was admitted with decompensated CHF in 2/12. Echo (2/12) showed EF 30-35% with diffuse hypokinesis but akinesis of the mid to apical anteroseptal wall and apex, moderate LVH, mild MR, grade I diastolic dysfunction. SPEP/UPEP negative and HIV negative in 2/12.  13.  CKD  Current Outpatient Prescriptions  Medication Sig Dispense Refill  . atorvastatin (LIPITOR) 20 MG tablet Take 20 mg by mouth daily.        . furosemide (LASIX) 40 MG tablet Take 1 tablet (40 mg total) by mouth daily.  Take two tabs by mouth once daily next three days for a total dose of 80mg  each day, then go back to 40mg  once daily  30 tablet  0  . glipiZIDE (GLUCOTROL XL) 5 MG 24 hr tablet Take 5 mg by mouth daily.        Marland Kitchen ibuprofen (ADVIL,MOTRIN) 200 MG tablet Take 400 mg by mouth every 8 (eight) hours as needed. For pain       . insulin glargine (LANTUS) 100 UNIT/ML injection Inject 20 Units into the skin at bedtime.       . lansoprazole (PREVACID) 30 MG capsule Take 30 mg by mouth daily.        Marland Kitchen losartan (COZAAR) 100 MG tablet Take 1 tablet (100 mg total) by mouth daily.  30 tablet  11  . metoprolol (TOPROL-XL) 100 MG 24 hr tablet Take 100 mg by mouth daily.       . naphazoline (CLEAR EYES) 0.012 % ophthalmic solution Place 2 drops into both eyes at bedtime as needed. For dry/itchy eyes         Allergies: No Known Allergies  History  Substance Use Topics  . Smoking status: Current Everyday Smoker -- 1.0 packs/day for 40 years    Types: Cigarettes  . Smokeless tobacco: Never Used  . Alcohol Use: No     ROS:  Please see the history of present illness.   All other systems reviewed and negative.   PHYSICAL EXAM: VS:  BP 122/94  Pulse 114  Resp 20  Ht 5\' 11"  (1.803 m)  Wt 126 lb 1.9 oz (57.208 kg)  BMI 17.59 kg/m2 Well nourished, well developed, in no acute distress HEENT: normal Neck: + JVD to jaw at 90 degrees Cardiac:  normal S1, S2; RRR; 2/6 systolic murmur along LLSB Lungs:  Decreased breath sounds bilaterally, no wheezing, rhonchi or rales, + egophony at the bases Abd: hard, distended, diffusely tender Ext: tight 2-3+ bilateral edema to upper thighs Back: + presacral edema Skin: warm and dry Neuro:  CNs 2-12 intact, no focal abnormalities noted Psych: normal affect  EKG:   Sinus tachycardia, HR 109, normal axis, PRWP, NSSTTW changes  Echocardiogram 05/19/11: Study Conclusions  - Left ventricle: The cavity size was normal. There was moderate asymmetric hypertrophy of the  septum. The estimated ejection fraction was in the range of 10% to 15%. Diffuse hypokinesis. Although no diagnostic regional wall motion abnormality was identified, this possibility cannot be completely excluded on the basis of this study. Features are consistent with a pseudonormal left ventricular filling pattern, with concomitant abnormal relaxation and increased filling pressure (grade 2 diastolic dysfunction). E/E'consistent withincreased left atrial pressure. - Ventricular septum: Septal motion showed abnormal function and dyssynergy. The contour showed diastolic flattening and systolic flattening. - Aortic valve: Trileaflet; mildly thickened  leaflets. Trivial regurgitation. - Mitral valve: Mild to moderate regurgitation. - Left atrium: The atrium was mildly dilated. - Right ventricle: The cavity size was mildly dilated. Systolic function was severely reduced. - Tricuspid valve: Moderate regurgitation. - Pulmonary arteries: Systolic pressure was mildly to moderately increased. PA peak pressure: 47mm Hg (S). - Inferior vena cava: The vessel was dilated; the respirophasic diameter changes were blunted (< 50%); findings are consistent with elevated central venous pressure. - Pericardium, extracardiac: A small pericardial effusion was identified posterior to the heart. There was a left pleural effusion.  Impressions:  - Further reduction in LVEF compared to prior study, now 10-15% with relatively diffuse hypokinesis suggestive of nonischemic cardiomyopathy. Moderate diastolic dysfunction with increased left atrial pressure. Mild to moderate mitral regurgitation and moderate tricuspid regurgitation. Moderate pulmonary hypertension. Small pericardial effusion noted. Also left pleural effusion.   Results communicated with Mr. Alben Spittle (assessing patient clinically in office) at 3:45 PM.  ASSESSMENT AND PLAN:

## 2011-05-20 DIAGNOSIS — R609 Edema, unspecified: Secondary | ICD-10-CM

## 2011-05-20 DIAGNOSIS — I5022 Chronic systolic (congestive) heart failure: Secondary | ICD-10-CM

## 2011-05-20 LAB — GLUCOSE, CAPILLARY
Glucose-Capillary: 204 mg/dL — ABNORMAL HIGH (ref 70–99)
Glucose-Capillary: 158 mg/dL — ABNORMAL HIGH (ref 70–99)
Glucose-Capillary: 175 mg/dL — ABNORMAL HIGH (ref 70–99)

## 2011-05-20 LAB — BASIC METABOLIC PANEL
CO2: 22 mEq/L (ref 19–32)
Calcium: 8.8 mg/dL (ref 8.4–10.5)
Glucose, Bld: 218 mg/dL — ABNORMAL HIGH (ref 70–99)
Sodium: 135 mEq/L (ref 135–145)

## 2011-05-20 LAB — CARDIAC PANEL(CRET KIN+CKTOT+MB+TROPI)
Total CK: 106 U/L (ref 7–232)
Troponin I: <0.3 ng/mL (ref <0.30)
Troponin I: <0.3 ng/mL (ref <0.30)

## 2011-05-20 MED ORDER — DEXTROSE 50 % IV SOLN
INTRAVENOUS | Status: AC
  Filled 2011-05-20: qty 50

## 2011-05-20 MED ORDER — SODIUM CHLORIDE 0.9 % IJ SOLN
10.0000 mL | INTRAMUSCULAR | Status: DC | PRN
  Administered 2011-05-23: 10 mL

## 2011-05-20 MED ORDER — FUROSEMIDE 10 MG/ML IJ SOLN
80.0000 mg | Freq: Three times a day (TID) | INTRAMUSCULAR | Status: DC
  Administered 2011-05-20 – 2011-05-23 (×9): 80 mg via INTRAVENOUS
  Filled 2011-05-20 (×11): qty 8

## 2011-05-20 MED ORDER — SPIRONOLACTONE 25 MG PO TABS
25.0000 mg | ORAL_TABLET | Freq: Every day | ORAL | Status: DC
  Administered 2011-05-20 – 2011-05-23 (×4): 25 mg via ORAL
  Filled 2011-05-20 (×4): qty 1

## 2011-05-20 MED ORDER — FUROSEMIDE 10 MG/ML IJ SOLN
80.0000 mg | Freq: Once | INTRAMUSCULAR | Status: AC
  Administered 2011-05-20: 80 mg via INTRAVENOUS
  Filled 2011-05-20: qty 8

## 2011-05-20 NOTE — Progress Notes (Signed)
SUBJECTIVE:  Still significant edema. No IV access. No scp. Orthopnea nad PND. No palpitations. Difficulty walking secondary to edema.   Active Problems:  HYPERTENSION, UNSPECIFIED  Systolic and diastolic CHF, acute on chronic  DVT (deep venous thrombosis)  Non-ischemic cardiomyopathy  Diabetes mellitus  Chronic kidney disease  Cirrhosis  Current Facility-Administered Medications  Medication Dose Route Frequency Provider Last Rate Last Dose  . 0.9 %  sodium chloride infusion  250 mL Intravenous PRN Beatrice Lecher, PA      . acetaminophen (TYLENOL) tablet 650 mg  650 mg Oral Q4H PRN Beatrice Lecher, PA   650 mg at 05/20/11 0051  . enoxaparin (LOVENOX) injection 40 mg  40 mg Subcutaneous Q24H Beatrice Lecher, PA      . furosemide (LASIX) injection 80 mg  80 mg Intravenous BID Beatrice Lecher, PA      . furosemide (LASIX) tablet 80 mg  80 mg Oral Once Gardiner Rhyme, MD   80 mg at 05/19/11 2309  . furosemide (LASIX) tablet 80 mg  80 mg Oral Once Gardiner Rhyme, MD   80 mg at 05/19/11 2130  . glipiZIDE (GLUCOTROL XL) 24 hr tablet 5 mg  5 mg Oral QAC breakfast Beatrice Lecher, PA   5 mg at 05/20/11 0610  . insulin glargine (LANTUS) injection 20 Units  20 Units Subcutaneous QHS Beatrice Lecher, PA   20 Units at 05/19/11 2310  . losartan (COZAAR) tablet 100 mg  100 mg Oral Daily Juliette Christine [place], [place]      . metoprolol (TOPROL-XL) 24 hr tablet 100 mg  100 mg Oral Daily Beatrice Lecher, [place]      . naphazoline-glycerin (CLEAR EYES) ophth solution 2 drop  2 drop Both Eyes QHS PRN Juliette Merrily Brittle, PHARMD      . ondansetron [facility]) injection 4 mg  4 mg Intravenous Q6H PRN Beatrice Lecher, PA      . pantoprazole (PROTONIX) EC tablet 40 mg  40 mg Oral Daily Beatrice Lecher, [place]      . potassium chloride SA (K-DUR,KLOR-CON) CR tablet 20 mEq  20 mEq Oral BID Beatrice Lecher, PA   20 mEq at 05/19/11 2310  . rosuvastatin (CRESTOR) tablet 20 mg  20 mg Oral q1800 Beatrice Lecher, PA       . sodium chloride 0.9 % injection 3 mL  3 mL Intravenous Q12H Beatrice Lecher, PA      . sodium chloride 0.9 % injection 3 mL  3 mL Intravenous PRN Beatrice Lecher, PA      . DISCONTD: losartan (COZAAR) tablet 100 mg  100 mg Oral Daily Beatrice Lecher, PA      . DISCONTD: naphazoline (CLEAR EYES) 0.012 % ophthalmic solution 2 drop  2 drop Both Eyes QHS PRN Beatrice Lecher, PA        LABS: Basic Metabolic Panel:  Basename 05/20/11 0056 05/19/11 1827  NA 135 135  K 4.8 4.4  CL 104 103  CO2 22 19  GLUCOSE 218* 91  BUN 16 15  CREATININE 1.26 1.24  CALCIUM 8.8 9.0  MG -- --  PHOS -- --   Liver Function Tests:  [facility] 05/19/11 1827  AST 52*  ALT 42  ALKPHOS 441*  BILITOT 2.9*  PROT 8.1  ALBUMIN 2.7*   No results found for this basename: LIPASE:2,AMYLASE:2 in the last 72 hours CBC:  Basename 05/19/11 1827  WBC 3.2*  NEUTROABS 1.4*  HGB 10.4*  HCT 36.3*  MCV 67.3*  PLT 198   Cardiac Enzymes:  Basename 05/20/11 0056  CKTOTAL 122  CKMB 4.2*  CKMBINDEX --  TROPONINI <0.30   BNP: No components found with this basename: POCBNP:3 D-Dimer: No results found for this basename: DDIMER:2 in the last 72 hours Hemoglobin A1C: No results found for this basename: HGBA1C in the last 72 hours Fasting Lipid Panel: No results found for this basename: CHOL,HDL,LDLCALC,TRIG,CHOLHDL,LDLDIRECT in the last 72 hours Thyroid Function Tests:  Basename 05/19/11 1827  TSH 2.586  T4TOTAL --  T3FREE --  THYROIDAB --    RADIOLOGY: No results found.  PHYSICAL EXAM BP 121/91  Pulse 112  Temp(Src) 97.2 F (36.2 C) (Oral)  Resp 20  Ht 5\' 11"  (1.803 m)  Wt 210 lb 12.2 oz (95.6 kg)  BMI 29.40 kg/m2  SpO2 98% General: well- nourished. No distress HEENT: normal carotid upstroke.JVP 10 cm + AJR No thyromegaly Cardiac: RRR, NL S1/S2.II/VI murmur. Apex. Lungs:Crackles bilaterallyAbdomen, soft, non- tender Extremities.3+ edema. Normal distal pulses.  Skin: warm and dry Psychologic:  normal affect.    ASSESSMENT AND PLAN: . 1. 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  2. Chronic kidney disease   3. Coronary atherosclerosis of native coronary artery ( non-ischemic CM)  4. Essential hypertension, benign   5. Shortness of breath   6. Systolic and diastolic CHF, acute on chronic EF=15%- biventricular failure  7. S/p GI bleeding - off Coumadin 8.s/p DVT s/p IVC filter.  9. Neutropenia  Increase Lasix to 80mg  IV TID and add spironolactone 25 mg PO qdaily. Insert PICC line today. Patient did not receive IV Lasix yet.  BMET in AM- FU K and creatinine.  F/U CBC in AM for neutropenia.   Alvin Critchley [facility]  05/20/2011, 7:56 AM

## 2011-05-20 NOTE — Progress Notes (Signed)
IV team tried to put IV in pt and were unsuccessful. MD notified, order for PICC placed.

## 2011-05-20 NOTE — Progress Notes (Signed)
PICC can not be placed tonight. MD made aware. IV lasix changed to PO for tonight. Will continue to monitor

## 2011-05-20 NOTE — Progress Notes (Signed)
Patient ID: [name], male   DOB: 09-20-1945, 66 y.o.   MRN: [id number]

## 2011-05-21 DIAGNOSIS — K7460 Unspecified cirrhosis of liver: Secondary | ICD-10-CM

## 2011-05-21 LAB — DIFFERENTIAL
Basophils Relative: 1 % (ref 0–1)
Eosinophils Absolute: 0.1 10*3/uL (ref 0.0–0.7)
Eosinophils Relative: 4 % (ref 0–5)
Lymphocytes Relative: 25 % (ref 12–46)
Monocytes Relative: 20 % — ABNORMAL HIGH (ref 3–12)
Neutrophils Relative %: 50 % (ref 43–77)

## 2011-05-21 LAB — BASIC METABOLIC PANEL
Chloride: 104 mEq/L (ref 96–112)
GFR calc Af Amer: 60 mL/min — ABNORMAL LOW (ref >90)
GFR calc non Af Amer: 52 mL/min — ABNORMAL LOW (ref >90)
Potassium: 3.7 mEq/L (ref 3.5–5.1)
Sodium: 138 mEq/L (ref 135–145)

## 2011-05-21 LAB — CBC
Hemoglobin: 8.7 g/dL — ABNORMAL LOW (ref 13.0–17.0)
RBC: 4.67 MIL/uL (ref 4.22–5.81)

## 2011-05-21 LAB — GLUCOSE, CAPILLARY
Glucose-Capillary: 191 mg/dL — ABNORMAL HIGH (ref 70–99)
Glucose-Capillary: 169 mg/dL — ABNORMAL HIGH (ref 70–99)

## 2011-05-21 NOTE — Progress Notes (Signed)
Pt smokes 1 ppd and says he's quit 5 x before in the past and always on his own. He voices understanding of risk factors and plans to quit on his own. Encouraged pt to quit and if unable to then contact myself or his MD.  Referred to 1-800 quit now for f/u and support. Discussed oral fixation substitutes, second hand smoke and in home smoking policy. Reviewed and gave pt Written education/contact information.

## 2011-05-21 NOTE — Progress Notes (Signed)
Consult received for diet education.  Attempted to provide education, however pt declines at this time. Notified RN. Will attempt education tomorrow.  Kendell Bane, RD, LDN [phone]

## 2011-05-21 NOTE — Progress Notes (Signed)
   Subjective:  Still very edematous.  Patient note scrotal and  Penile edema. Weight down 5 lb overnight.  Objective:  Vital Signs in the last 24 hours: Temp:  [97.4 F (36.3 C)-97.5 F (36.4 C)] 97.5 F (36.4 C) (01/02 0531) Pulse Rate:  [104-114] 108  (01/02 0531) Resp:  [18-20] 18  (01/02 0531) BP: (105-119)/(75-90) 110/79 mmHg (01/02 0531) SpO2:  [97 %-99 %] 99 % (01/02 0531) Weight:  [205 lb 7.5 oz (93.2 kg)] 205 lb 7.5 oz (93.2 kg) (01/02 0511)  Intake/Output from previous day: 01/01 0701 - 01/02 0700 In: 1364 [P.O.:1020; I.V.:320; IV Piggyback:24] Out: 4175 [Urine:4175] Intake/Output from this shift: Total I/O In: 240 [P.O.:240] Out: 300 [Urine:300]     . enoxaparin  40 mg Subcutaneous Q24H  . furosemide  80 mg Intravenous TID  . furosemide  80 mg Intravenous Once  . glipiZIDE  5 mg Oral QAC breakfast  . insulin glargine  20 Units Subcutaneous QHS  . losartan  100 mg Oral Daily  . metoprolol  100 mg Oral Daily  . pantoprazole  40 mg Oral Daily  . potassium chloride  20 mEq Oral BID  . rosuvastatin  20 mg Oral q1800  . sodium chloride  3 mL Intravenous Q12H  . spironolactone  25 mg Oral Daily      Physical Exam: The patient appears to be in no distress.  Head and neck exam reveals that the pupils are equal and reactive.  The extraocular movements are full.  There is no scleral icterus.  Mouth and pharynx are benign.  No lymphadenopathy.  No carotid bruits.  The jugular venous pressure is normal.  Thyroid is not enlarged or tender.  Chest is clear to percussion and auscultation.  No rales or rhonchi.  Expansion of the chest is symmetrical.  Heart reveals no abnormal lift or heave.  First and second heart sounds are normal.  There is no murmur gallop rub or click.  The abdomen is soft and nontender.  Bowel sounds are normoactive.  There is no hepatosplenomegaly or mass.  There are no abdominal bruits.  Extremities reveal significant pedal and presacral  edema.  Neurologic exam is normal strength and no lateralizing weakness.  No sensory deficits.  Integument reveals no rash  Lab Results:  Basename 05/21/11 0549 05/19/11 1827  WBC 3.7* 3.2*  HGB 8.7* 10.4*  PLT 147* 198    Basename 05/21/11 0549 05/20/11 0056  NA 138 135  K 3.7 4.8  CL 104 104  CO2 26 22  GLUCOSE 170* 218*  BUN 20 16  CREATININE 1.41* 1.26    Basename 05/20/11 1620 05/20/11 0056  TROPONINI <0.30 <0.30   Hepatic Function Panel  Basename 05/19/11 1827  PROT 8.1  ALBUMIN 2.7*  AST 52*  ALT 42  ALKPHOS 441*  BILITOT 2.9*  BILIDIR --  IBILI --   No results found for this basename: CHOL in the last 72 hours No results found for this basename: PROTIME in the last 72 hours  Imaging: Imaging results have been reviewed  Cardiac Studies:  Assessment/Plan:  Patient Active Hospital Problem List:      Acute on chronic cobined systolic and diastolic CHF  Plan:  Now has PICC line and is able to receive IV Lasix. Continue current Rx  LOS: 2 days    Cassell Clement 05/21/2011, 9:02 AM

## 2011-05-21 NOTE — Progress Notes (Signed)
UR Completed.  [name] 05/21/2011 [phone]  

## 2011-05-21 NOTE — Progress Notes (Signed)
Inpatient Diabetes Program Recommendations  AACE/ADA: New Consensus Statement on Inpatient Glycemic Control (2009)  Target Ranges:  Prepandial:   less than 140 mg/dL      Peak postprandial:   less than 180 mg/dL (1-2 hours)      Critically ill patients:  140 - 180 mg/dL   CBGs 16/10: 960/ 454/ 158/ 175 mg/dl  Inpatient Diabetes Program Recommendations Correction (SSI): Please start Novolog Sensitive correction (SSI) tid ac + HS.

## 2011-05-22 DIAGNOSIS — I428 Other cardiomyopathies: Secondary | ICD-10-CM

## 2011-05-22 LAB — GLUCOSE, CAPILLARY
Glucose-Capillary: 141 mg/dL — ABNORMAL HIGH (ref 70–99)
Glucose-Capillary: 183 mg/dL — ABNORMAL HIGH (ref 70–99)

## 2011-05-22 LAB — BASIC METABOLIC PANEL
BUN: 21 mg/dL (ref 6–23)
GFR calc non Af Amer: 56 mL/min — ABNORMAL LOW (ref >90)
Glucose, Bld: 156 mg/dL — ABNORMAL HIGH (ref 70–99)
Potassium: 3.9 mEq/L (ref 3.5–5.1)

## 2011-05-22 NOTE — Plan of Care (Signed)
Problem: Food- and Nutrition-Related Knowledge Deficit (NB-1.1) Goal: Nutrition education Formal process to instruct or train a patient/client in a skill or to impart knowledge to help patients/clients voluntarily manage or modify food choices and eating behavior to maintain or improve health.  Outcome: Completed/Met Date Met:  05/22/11 Completed low sodium diet education with pt. Reviewed information in heart failure booklet. Focus on label reading to stay within his sodium limits. Also discussed how to modify eating out to limit sodium consumption. Recommend outpatient education consult to the Nutrition and Diabetes Management center for further reinforcement of low sodium diet.

## 2011-05-22 NOTE — Progress Notes (Signed)
   CARE MANAGEMENT NOTE HEART FAILURE  05/22/2011   Patient:  [name], [name]   Account Number:  [id number]    Date Initiated:  05/21/2011  Documentation initiated by:  Shannan Harper  Subjective/Objective Assessment:   Patient admitted with worsening SOB and lower extremity edema.  Seen by MD for echo today and due to worsening symptoms admitted for further management.  Hx of CHF, new echo shows EF 10-15%.   Action/Plan:   Discharge back to home alone as per PTA.  Will attempt to have a HHRN for CHF management at discharge.   Anticipated DC Date:    Anticipated DC Plan:  HOME W HOME HEALTH SERVICES  DC Planning Services:  CM consult    [facility] Choice:  HOME HEALTH   Choice offered to / List presented to:  C-1 Patient    HH arranged:  HH-1 RN  HH-10 DISEASE MANAGEMENT  HH-4 NURSE'S AIDE     HH agency:  [facility]    Status of service:  In process, will continue to follow  Medicare Important Message Given:   (If response is "NO", the following Medicare IM given date fields will be blank) Date Medicare IM Given:   Date Additional Medicare IM Given:    Discharge Disposition:  HOME W HOME HEALTH SERVICES  Per UR Regulation:  Reviewed for med. necessity/level of care/duration of stay  Comments:   sSpoke with patient discussed heart failure education. Referral placed with [facility] for [facility] and aide as per above. Will continue to assist. 05/22/11 1555 Shannan Harper, RN, BSN   UR Completed. 05/21/11 0540 Shannan Harper, RN, BSN   Initial CM contact:  05/22/2011 12:00 M  By:  Shannan Harper Initial CSW contact:     By:      Is this an INP Readmission < 30 days:  Y (If "YES" please see readmission information at the bottom of note)  Patient living status prior to this admission:  ALONE  Patient setting prior to this admission:  HOME  Comorbid conditions being treated that contributed to this admission:  HIGH-READMIT, CHF, CKD, CAD, DM  CHF Readmission Risk:  high  Type  of patient education provided  HF Patient Education Assessment / Teach Back  HF Zone Tool / Magnet  Limit salt intake  Weigh daily     Patient education provided by  [facility]    Was referral made to [facility]:  N  Is the patient's PCP the same as attending:   PCP:  REESE,BETTI D  Readmission < 30 Days If pt has HH, did they contact the agency before going to the ED:   Name of [facility] agency:    Was the follow-up physician visit scheduled prior to discharge:  Y  Did the patient follow-up with the physician prior to this readmission:  Y  Was there HF Clinic visits prior to readmission:  N  Were there ED visits between admissions:  N  Readmit type:  Unscheduled/Unrelated  If unscheduled and related indicate reason for readmit:

## 2011-05-22 NOTE — Progress Notes (Signed)
  Subjective:  Still very edematous but feels "much better".  Denies CP or SOB  Objective:  Vital Signs in the last 24 hours: Temp:  [97.4 F (36.3 C)-98.6 F (37 C)] 97.6 F (36.4 C) (01/03 1315) Pulse Rate:  [109-123] 115  (01/03 1315) Resp:  [18-22] 22  (01/03 1315) BP: (114-127)/(81-89) 116/86 mmHg (01/03 1315) SpO2:  [98 %-100 %] 100 % (01/03 1315) Weight:  [191 lb 4.8 oz (86.773 kg)] 191 lb 4.8 oz (86.773 kg) (01/03 0526)  Intake/Output from previous day: 01/02 0701 - 01/03 0700 In: 1274 [P.O.:960; I.V.:290; IV Piggyback:24] Out: 6750 [Urine:6750] Intake/Output from this shift: Total I/O In: 1389.3 [P.O.:1260; I.V.:129.3] Out: 2076 [Urine:2075; Stool:1]     . enoxaparin  40 mg Subcutaneous Q24H  . furosemide  80 mg Intravenous TID  . glipiZIDE  5 mg Oral QAC breakfast  . insulin glargine  20 Units Subcutaneous QHS  . losartan  100 mg Oral Daily  . metoprolol  100 mg Oral Daily  . pantoprazole  40 mg Oral Daily  . potassium chloride  20 mEq Oral BID  . rosuvastatin  20 mg Oral q1800  . sodium chloride  3 mL Intravenous Q12H  . spironolactone  25 mg Oral Daily      Physical Exam: The patient appears to be in no distress. Tele- sinus tach  Head and neck exam reveals that the pupils are equal and reactive.  The extraocular movements are full.  There is no scleral icterus.  Mouth and pharynx are benign.  No lymphadenopathy.  No carotid bruits.  The jugular venous pressure is elevated.  Thyroid is not enlarged or tender.   Decreased BS at bases.  No rales or rhonchi.  Expansion of the chest is symmetrical.  Heart reveals no abnormal lift or heave.  First and second heart sounds are normal.  There is no murmur gallop rub or click.  The abdomen is soft and nontender.  Bowel sounds are normoactive.  There is no hepatosplenomegaly or mass.  There are no abdominal bruits.  Extremities reveal significant pedal and presacral edema.  Neurologic exam is normal strength  and no lateralizing weakness.  No sensory deficits.  Integument reveals no rash  Lab Results:  Basename 05/21/11 0549 05/19/11 1827  WBC 3.7* 3.2*  HGB 8.7* 10.4*  PLT 147* 198    Basename 05/22/11 0520 05/21/11 0549  NA 141 138  K 3.9 3.7  CL 102 104  CO2 28 26  GLUCOSE 156* 170*  BUN 21 20  CREATININE 1.31 1.41*    Basename 05/20/11 1620 05/20/11 0056  TROPONINI <0.30 <0.30   Hepatic Function Panel  Basename 05/19/11 1827  PROT 8.1  ALBUMIN 2.7*  AST 52*  ALT 42  ALKPHOS 441*  BILITOT 2.9*  BILIDIR --  IBILI --   No results found for this basename: CHOL in the last 72 hours No results found for this basename: PROTIME in the last 72 hours  Imaging: Imaging results have been reviewed  Cardiac Studies:  Assessment/Plan:  Patient Active Hospital Problem List:      Acute on chronic cobined systolic and diastolic CHF  Plan:  Continue IV diuresis Consider switching to PO lasix in 1-2 days  Continue current Rx  LOS: 3 days    Hillis Range 05/22/2011, 2:45 PM

## 2011-05-23 ENCOUNTER — Encounter (HOSPITAL_COMMUNITY): Payer: Self-pay | Admitting: Physician Assistant

## 2011-05-23 DIAGNOSIS — I5023 Acute on chronic systolic (congestive) heart failure: Principal | ICD-10-CM

## 2011-05-23 LAB — BASIC METABOLIC PANEL
BUN: 19 mg/dL (ref 6–23)
Calcium: 9.3 mg/dL (ref 8.4–10.5)
Creatinine, Ser: 1.36 mg/dL — ABNORMAL HIGH (ref 0.50–1.35)
GFR calc Af Amer: 62 mL/min — ABNORMAL LOW (ref >90)
GFR calc non Af Amer: 54 mL/min — ABNORMAL LOW (ref >90)

## 2011-05-23 LAB — CBC
HCT: 34.7 % — ABNORMAL LOW (ref 39.0–52.0)
MCHC: 27.4 g/dL — ABNORMAL LOW (ref 30.0–36.0)
Platelets: 170 10*3/uL (ref 150–400)
RDW: 23.5 % — ABNORMAL HIGH (ref 11.5–15.5)

## 2011-05-23 LAB — GLUCOSE, CAPILLARY: Glucose-Capillary: 75 mg/dL (ref 70–99)

## 2011-05-23 MED ORDER — FUROSEMIDE 40 MG PO TABS: 80.0000 mg | ORAL_TABLET | Freq: Two times a day (BID) | ORAL | Status: DC

## 2011-05-23 MED ORDER — POTASSIUM CHLORIDE CRYS ER 20 MEQ PO TBCR: 40.0000 meq | EXTENDED_RELEASE_TABLET | Freq: Two times a day (BID) | ORAL | Status: DC

## 2011-05-23 MED ORDER — SPIRONOLACTONE 25 MG PO TABS: 25.0000 mg | ORAL_TABLET | Freq: Every day | ORAL | Status: DC

## 2011-05-23 MED ORDER — POTASSIUM CHLORIDE CRYS ER 20 MEQ PO TBCR
40.0000 meq | EXTENDED_RELEASE_TABLET | Freq: Once | ORAL | Status: AC
  Administered 2011-05-23: 40 meq via ORAL
  Filled 2011-05-23: qty 2

## 2011-05-23 NOTE — Discharge Summary (Signed)
Discharge Summary   Patient ID: CASPIAN DELEONARDIS MRN: [id number], DOB/AGE: 07-27-1945 66 y.o. Admit date: 05/19/2011 D/C date:     05/23/2011   Primary Discharge Diagnoses:  1. Acute on chronic systolic CHF secondary to NICM - 2D echo EF down to 10-15% by echo 04/2011 (previously 30-35% 06/2010) - no evidence of CAD by cath 02/2010 - SPEP/UPEP and HIV negative in 06/2010 2. HTN 3. Anemia 4. Chronic renal insufficiency - d/c Cr 1.31  Secondary Discharge Diagnoses:  1. Prior UGI bleed - hx of duodenal AVMs - rectal bleeding 04/2011 requiring blood transfusion with CT at that time showing cirrhosis with mod ascites and anasarca felt by GI related to chronic passive congestion and cardiac disease - Coumadin on hold because of GIB, being managed by primary doctor 2. Diabetes mellitus 3. Prior hx of DVT - DVTs in 2004, 2005, 2007, 8/10, 8/11 - IVC filter placed 2005 4. GERD 5. BPH 6. Low back pain  Hospital Course: 66 y/o with complex PMH presented to the office with progressive dyspnea, LEE, scrotal and abdominal edema. He underwent echo showing decreased EF of 10-15% and was felt to have marked volume overload with acute on chronic systolic CHF. He was placed on IV diuresis with fairly good result. Weight was 203 on admit, down to 182 today. However he is still quite edematous and requires further hospitalization, but the patient is frustrated and wants to go home. He has had some loose stools and is frustrated with that. He was very clear to Dr. Johney Frame that he was going home at this time. He refuses to cooperate with further inpatient care. Therefore Dr. Johney Frame recommends that he f/u with CHF clinic, continue beta blocker, ARB, and spironolactone, convert lasix to 80mg  BID, continue KCL BID with follow-up BMET on Monday - he has seen and examined the patient today and feels although suboptimal, he is stable for discharge. Dr. Johney Frame notes he may benefit from addition of hydralazine and  isosorbide but feels this can be initiated as an outpatient in the heart failure clinic. His follow-up is scheduled with BMET on 05/26/11. Of note, his glucose was low this AM on BMET but CBG since have come up.  Discharge Vitals: Blood pressure 111/82, pulse 114, temperature 97.5 F (36.4 C), temperature source Oral, resp. rate 20, height 5\' 11"  (1.803 m), weight 182 lb 8.7 oz (82.8 kg), SpO2 100.00%.  Labs: Lab Results  Component Value Date   WBC 4.5 05/23/2011   HGB 9.5* 05/23/2011   HCT 34.7* 05/23/2011   MCV 67.5* 05/23/2011   PLT 170 05/23/2011     Lab 05/23/11 0601 05/19/11 1827  NA 138 --  K 3.4* --  CL 98 --  CO2 34* --  BUN 19 --  CREATININE 1.36* --  CALCIUM 9.3 --  PROT -- 8.1  BILITOT -- 2.9*  ALKPHOS -- 441*  ALT -- 42  AST -- 52*  GLUCOSE 46* --    Basename 05/20/11 1620  CKTOTAL 106  CKMB 3.9  TROPONINI <0.30   Lab Results  Component Value Date   CHOL 143 07/23/2010   HDL 34.80* 07/23/2010   LDLCALC 86 07/23/2010   TRIG 109.0 07/23/2010    Diagnostic Studies/Procedures   1. Chest 2 View 05/12/2011  *RADIOLOGY REPORT*  Clinical Data: Very short of breath  CHEST - 2 VIEW  Comparison: Chest x-ray of 03/15/2011  Findings: There is moderate cardiomegaly present with mild pulmonary vascular congestion and effusions, most consistent with  congestive heart failure.  Pneumonia at the lung bases would be difficult to exclude.  No bony abnormality is seen.  Surgical clips are present in the right upper quadrant from prior cholecystectomy.  IMPRESSION: Cardiomegaly, effusions, and mild congestion most consistent with congestive heart failure.  Original Report Authenticated By: Juline Patch, M.D.    Discharge Medications   Current Discharge Medication List    START taking these medications   Details  potassium chloride SA (K-DUR,KLOR-CON) 20 MEQ tablet Take 2 tablets (40 mEq total) by mouth 2 (two) times daily. Qty: 120 tablet, Refills: 1    spironolactone (ALDACTONE) 25 MG  tablet Take 1 tablet (25 mg total) by mouth daily. Qty: 30 tablet, Refills: 2      CONTINUE these medications which have CHANGED   Details  furosemide (LASIX) 40 MG tablet Take 2 tablets (80 mg total) by mouth 2 (two) times daily.      CONTINUE these medications which have NOT CHANGED   Details  atorvastatin (LIPITOR) 20 MG tablet Take 20 mg by mouth daily.      glipiZIDE (GLUCOTROL XL) 5 MG 24 hr tablet Take 5 mg by mouth daily.      insulin glargine (LANTUS) 100 UNIT/ML injection Inject 20 Units into the skin at bedtime.     lansoprazole (PREVACID) 30 MG capsule Take 30 mg by mouth daily.      naphazoline (CLEAR EYES) 0.012 % ophthalmic solution Place 2 drops into both eyes at bedtime as needed. For dry/itchy eyes     losartan (COZAAR) 100 MG tablet Take 100 mg by mouth daily.      metoprolol (TOPROL-XL) 100 MG 24 hr tablet Take 100 mg by mouth daily.        STOP taking these medications     ibuprofen (ADVIL,MOTRIN) 200 MG tablet         Disposition   The patient will be discharged in stable condition to home. Discharge Orders    Future Appointments: Provider: Department: Dept Phone: Center:   05/26/2011 12:45 PM [facility] [phone] None     Future Orders Please Complete By Expires   Diet - low sodium heart healthy      Increase activity slowly      Comments:   Please see below about WEIGHING YOURSELF DAILY.   Discharge instructions      Comments:   Please be checking your blood sugars at home since you had an episode of low blood sugar this morning. If your sugars become too low, you need to call your primary doctor to help adjust your insulin.     Follow-up Information    Follow up with [facility]. (You will have an appointment Monday 05/26/11 at 12:45pm with labwork that day at the Heart Failure Clinic on the 1st floor of [facility])    Contact information:   [facility] [phone]       Follow up with Primary Care Doctor. (For your anemia, diabetes, and other chronic issues including when to resume Coumadin)            Duration of Discharge Encounter: Greater than 30 minutes including physician and PA time.  Signed, Dayna Dunn PA-C 05/23/2011, 3:53 PM  I have seen, examined the patient, and reviewed the above discharge assessment and plan.   Co Sign: Hillis Range, MD 05/23/2011 5:36 PM

## 2011-05-23 NOTE — Progress Notes (Signed)
Patient is very agitated and states that he wants the doctor called and wants to go home. Patient stood up and made bowel movement in the floor of his room.  MD has been paged and nurse is awaiting call.____________D. Manson Passey RN

## 2011-05-23 NOTE — Progress Notes (Signed)
Pt's tele d/c, pt's IV d/c. Patient verbalizes understanding of discharge information and medications_________________________________________________________________________________________________D. Devarius Nelles RN 

## 2011-05-23 NOTE — Progress Notes (Signed)
Pt was agitated that he was given a bus pass to return home. Pt wanted to contact [facility] to take him home. CSW provided patient with a taxi voucher which patient was agreeable to.

## 2011-05-23 NOTE — Progress Notes (Signed)
Pt is irritable and states that he is ready to go home and did not get his am medications from previous shift, previous shift nurse stated that diabetic medication had not come up from the pharmacy. Once medication was sent up from pharmacy it was given to patient._______________________________________________________D. Manson Passey RN

## 2011-05-23 NOTE — Progress Notes (Signed)
Subjective:  Still very edematous but feels "much better".  Denies CP or SOB.  He has had lose stools this am.  He is frustrated with lose stools and "wants to be at home".  He is very clear that he is leaving the hospital at this time.  Objective:  Vital Signs in the last 24 hours: Temp:  [97.6 F (36.4 C)-98.6 F (37 C)] 98.6 F (37 C) (01/04 1016) Pulse Rate:  [115-122] 115  (01/04 1016) Resp:  [18-22] 18  (01/04 1016) BP: (116-133)/(78-88) 121/78 mmHg (01/04 1016) SpO2:  [100 %] 100 % (01/04 1016) Weight:  [182 lb 8.7 oz (82.8 kg)] 182 lb 8.7 oz (82.8 kg) (01/04 0438)  Intake/Output from previous day: 01/03 0701 - 01/04 0700 In: 2492 [P.O.:2100; I.V.:368; IV Piggyback:24] Out: 9528 [UXLKG:4010; Stool:8] Intake/Output from this shift: Total I/O In: 243 [P.O.:240; I.V.:3] Out: 415 [Urine:415]     . enoxaparin  40 mg Subcutaneous Q24H  . furosemide  80 mg Intravenous TID  . glipiZIDE  5 mg Oral QAC breakfast  . insulin glargine  20 Units Subcutaneous QHS  . losartan  100 mg Oral Daily  . metoprolol  100 mg Oral Daily  . pantoprazole  40 mg Oral Daily  . potassium chloride  20 mEq Oral BID  . rosuvastatin  20 mg Oral q1800  . sodium chloride  3 mL Intravenous Q12H  . spironolactone  25 mg Oral Daily     Physical Exam: Filed Vitals:   05/22/11 1705 05/22/11 2208 05/23/11 0438 05/23/11 1016  BP: 122/88 123/88 133/86 121/78  Pulse: 115 117 122 115  Temp: 97.9 F (36.6 C) 97.8 F (36.6 C) 97.9 F (36.6 C) 98.6 F (37 C)  TempSrc: Oral Oral Oral Oral  Resp: 20 20 20 18   Height:      Weight:   182 lb 8.7 oz (82.8 kg)   SpO2: 100% 100% 100% 100%    GEN- The patient is chronically ill appearing, alert and oriented x 3 today.  But aggitated Head- normocephalic, atraumatic Eyes-  Sclera clear, conjunctiva pink Ears- hearing intact Oropharynx- clear Neck- supple, JVP remains elevated 11cm Lymph- no cervical lymphadenopathy Lungs- Clear to ausculation bilaterally,  normal work of breathing Heart- tachycardic regular rhythm, laterally displaced PMI GI- soft, NT, ND, + BS Extremities- no clubbing, cyanosis, 2+ chronic edema which is improving MS- no significant deformity or atrophy Skin- no rash or lesion Psych- euthymic mood, full affect Neuro- strength and sensation are intact  Lab Results:  Basename 05/23/11 0601 05/21/11 0549  WBC 4.5 3.7*  HGB 9.5* 8.7*  PLT 170 147*    Basename 05/23/11 0601 05/22/11 0520  NA 138 141  K 3.4* 3.9  CL 98 102  CO2 34* 28  GLUCOSE 46* 156*  BUN 19 21  CREATININE 1.36* 1.31    Basename 05/20/11 1620  TROPONINI <0.30   Hepatic Function Panel No results found for this basename: PROT,ALBUMIN,AST,ALT,ALKPHOS,BILITOT,BILIDIR,IBILI in the last 72 hours No results found for this basename: CHOL in the last 72 hours No results found for this basename: PROTIME in the last 72 hours   Assessment/Plan:   [name] is a 66 yo AAM with h/o cirrhosis and CHF now admitted with acute decompensated CHF.  Remains volume overloaded. 1. Acute on chronic systolic dysfunction Remains volume overloaded, unfortunately, he is clear that he will not remain in the hospital any longer.  I have discussed his recent Echo revealing EF 10-15% with patient and the poor  prognostic implications of this.  I have strongly advised that he remain hospitalized for ongoing inpatient medical management.  Unfortunately, he is not willing to cooperate and refuses further inpatient care. I will therefore have our PA make arrangements for follow-up on Monday in the advanced heart failure clinic.  He will need very close medical management. Continue beta blocker, ARB, and spironolactone.  Convert lasix to 80mg  BID BID Continue KCL BID, with follow-up BMET on Monday He may benefit from addition of hydralazine and isosorbide.  This can be initiated as an outpatient in the heart failure clinic. Follow-up with Dr Shirlee Latch at next available time in  addition to CHF clinic.  2. HTN- stable  3. CRI- stable  4. Hypokalemia- replete   LOS: 4 days    Hillis Range 05/23/2011, 1:12 PM

## 2011-05-26 ENCOUNTER — Encounter (HOSPITAL_COMMUNITY): Payer: Self-pay

## 2011-05-26 ENCOUNTER — Ambulatory Visit (HOSPITAL_COMMUNITY)
Admission: RE | Admit: 2011-05-26 | Discharge: 2011-05-26 | Disposition: A | Discharge status: Home / Self Care | Payer: PRIVATE HEALTH INSURANCE | Source: Ambulatory Visit | Attending: Internal Medicine | Admitting: Internal Medicine

## 2011-05-26 VITALS — BP 106/77 | HR 115 | Wt 193.0 lb

## 2011-05-26 DIAGNOSIS — I5022 Chronic systolic (congestive) heart failure: Secondary | ICD-10-CM

## 2011-05-26 LAB — BASIC METABOLIC PANEL
BUN: 21 mg/dL (ref 6–23)
Calcium: 8.8 mg/dL (ref 8.4–10.5)
Creatinine, Ser: 1.25 mg/dL (ref 0.50–1.35)
GFR calc non Af Amer: 60 mL/min — ABNORMAL LOW (ref >90)
Glucose, Bld: 106 mg/dL — ABNORMAL HIGH (ref 70–99)
Potassium: 4 mEq/L (ref 3.5–5.1)
Sodium: 138 mEq/L (ref 135–145)

## 2011-05-26 MED ORDER — METOPROLOL SUCCINATE ER 100 MG PO TB24: 50.0000 mg | ORAL_TABLET | Freq: Every day | ORAL | Status: DC

## 2011-05-26 MED ORDER — DIGOXIN 125 MCG PO TABS: 125.0000 ug | ORAL_TABLET | Freq: Every day | ORAL | Status: DC

## 2011-05-26 MED ORDER — METOLAZONE 5 MG PO TABS: 5.0000 mg | ORAL_TABLET | Freq: Every day | ORAL | Status: DC

## 2011-05-26 NOTE — Assessment & Plan Note (Addendum)
NYHA III-IIIb. Volume status remains elevated. It should be noted that he insisted on hospital discharge 1/3 even though he was remained volume. Weight has increased 11 pounds since discharge.  He has not filled new medications from hospital discharge which includedLosartan, Spironolactone, and potassium. Will decrease Toprol XL to 50 mg daily. Stop Losartan.  Start digoxin 0.25 mg daily. Give 5 mg Metolazone daily. Will aggressively try and diurese however he understands he may require hospital admit if we cant remove fluid.   Discussed purpose of  Heart Failure Clinic. Re-educated about medications, daily weights, and nutrition. He will follow up on Friday.   Patient seen and examined with [name], NP. We discussed all aspects of the encounter. I agree with the assessment and plan as stated above. Mr. Tarver remains markedly volume overloaded despite recent admission with evidence of low-output. He has been noncompliant with many of his meds. Will cut down b-blocker at this point and add digoxin and metolazone to try and facilitate aggressive diuresis over the next few days.Will see him back at the end of the week to re-assess. If no better will need to be readmitted. Hold off on starting ARB for now as not to confuse him with too many new meds at one time. Long talk with him and his nephew about high-risk of adverse events with > 50% mortality over the next year. Stressed need for compliance. Hopefully start ARB on Friday if renal function stable.   Total MD time spent = 45 mins with over 70% of that time dedicated to counseling and discussions described above.

## 2011-05-26 NOTE — Patient Instructions (Addendum)
  Take Toprol XL 50 mg daily  Start Digoxin 0.125 mg daily  Take Metolazone 5mg  daily  Follow up on Friday 05/27/2010   Do the following things EVERYDAY: 1) Weigh yourself in the morning before breakfast. Write it down and keep it in a log. 2) Take your medicines as prescribed 3) Eat low salt foods-Limit salt (sodium) to 2000mg  per day.  4) Stay as active as you can everyday

## 2011-05-26 NOTE — Progress Notes (Signed)
Patient ID: [name], male   DOB: 07-Nov-1945, 66 y.o.   MRN: [id number] HPI:   [name] is a 66 year old male with chronic systolic CHF secondary to NICM echo EF 10-15% by echo 04/2011 (previously 30-35% 06/2010), HTN, anemia, chronic renal insufficiency Cr 1.31,  UGI bleed,  duodenal AVMs,  rectal bleeding 04/2011,  DVTs (2004, 2005, 2007, 2010, 2011) however coumadin on hold due to GI bleed, IVC filter 2005, GERD, BPD, and chronic low back .   Followed by Dr. Shirlee [name] though cared interrupted at times by non-compliance. He is here to establish in HF as part ofpost-hospitalization f/u. Discharged from [facility] 1/3 after admit for ADHF. Discharge weight 182 pounds. Discharged on Lasix 80 mg bid , Losartan 100 mg daily, Toprol XL 100 mg daily, and Spironolactone 25 mg daily. Says he demanded to leave because care on floor was bad.   Overall,  he feels ok.Though doesn't get around much. Has not been able to get all his meds after d/c yet. Weight at home 187 pounds. Sleeps on two pillows. Denies SOB/PND. Still with significant edema.Marland Kitchen He is trying to follow low salt diet. Lives alone. Pending help from [facility] RN, Nurse Aide and PT. Denies SOB on exertion but does complain of fatigue in his legs. Can only walk a few yards before having to stop. Uses a walker when he goes out.  Smokes a pack of cigarettes every other day. He is adamant he will no longer take Coumadin due GI bleeding.  He is adamant that he will not return to the hospital. .   ROS: All systems negative except as listed in HPI, PMH and Problem List.  Past Medical History  Diagnosis Date  . Venous thromboembolism     DVTs in 2004, 2005, 2007, 8/10, 8/11. IVC filter 2005. Off coumadin 04/2011 secondary to rectal bleeding  . 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 Chronic venous insufficiency   . DM2 (diabetes mellitus, type 2)   . GERD (gastroesophageal reflux disease)   . BPH (benign prostatic hypertrophy)   . Low  back pain   . HTN (hypertension)   . HLD (hyperlipidemia)   . NICM (nonischemic cardiomyopathy) 1. 9/11  2. 2/12    1. Echo septal and apical akinesis, dilated LV, EF 45%, RV normal size and systolic function, EF 43% on Myoview  2. Admitted with decompensated CHF, echo EF 30-35% with diffuse hypokinesis but akinesis of mid to apical anteroseptal wall and apex, moderate LVH, mild MR, grade I diastolic dysfunction, SPEP/UP negative and HIV negative. Most recent EF 10-15% 04/2011  . Smoking   . CAD (coronary artery disease) 1. 2007  2. 10/11    1. Left heart cath with 40-50% stenosis in small RCA EF 50%  2. Lexiscan myoview EF 43%, global hypokinesis, possible small area of apical ischemia; LHC no angiographic CAD, no LV-gram done due to CKD   2. No angiographic CAD by cath 02/2010  . CKD (chronic kidney disease) stage 2, GFR 60-89 ml/min as of 02/2011  . Headache   . Diabetes mellitus   . Blood transfusion   . Anemia     Current Outpatient Prescriptions  Medication Sig Dispense Refill  . atorvastatin (LIPITOR) 20 MG tablet Take 20 mg by mouth daily.        . furosemide (LASIX) 40 MG tablet Take 2 tablets (80 mg total) by mouth 2 (two) times daily.      Marland Kitchen  glipiZIDE (GLUCOTROL XL) 5 MG 24 hr tablet Take 5 mg by mouth daily.        . insulin glargine (LANTUS) 100 UNIT/ML injection Inject 20 Units into the skin at bedtime.       . lansoprazole (PREVACID) 30 MG capsule Take 30 mg by mouth daily.        Marland Kitchen losartan (COZAAR) 100 MG tablet Take 100 mg by mouth daily.        . metoprolol (TOPROL-XL) 100 MG 24 hr tablet Take 100 mg by mouth daily.        . naphazoline (CLEAR EYES) 0.012 % ophthalmic solution Place 2 drops into both eyes at bedtime as needed. For dry/itchy eyes       . potassium chloride SA (K-DUR,KLOR-CON) 20 MEQ tablet Take 2 tablets (40 mEq total) by mouth 2 (two) times daily.  120 tablet  1  . spironolactone (ALDACTONE) 25 MG tablet Take 1 tablet (25 mg total) by mouth daily.  30  tablet  2     PHYSICAL EXAM: Filed Vitals:   05/26/11 1237  BP: 106/77  Pulse: 115   Weight change:  193 pounds  (discharge weight 183) General:  Appears older than stated age. Fatigued. No resp difficulty (nephew present) HEENT: normal except for poor dentition Neck: supple. JVP to jaw. Carotids 2+ bilaterally; no bruits. No lymphadenopathy or thryomegaly appreciated. Cor: PMI displaced laterally. Tachycardic  rate & rhythm. Prominent s3 Lungs: clear Abdomen: soft, nontender, + distended. No hepatosplenomegaly. No bruits or masses. Good bowel sounds. Extremities: no cyanosis, clubbing, rash, 3+ lower extremity edema into thighs Neuro: alert & orientedx3, cranial nerves grossly intact. Moves all 4 extremities w/o difficulty. Affect pleasant.    ASSESSMENT & PLAN:

## 2011-05-27 ENCOUNTER — Telehealth (HOSPITAL_COMMUNITY): Payer: Self-pay | Admitting: *Deleted

## 2011-05-27 NOTE — Telephone Encounter (Signed)
Left message to call back  

## 2011-05-27 NOTE — Progress Notes (Signed)
I will need to see him in about 2 wks here.

## 2011-05-27 NOTE — Telephone Encounter (Signed)
Amy from [facility] called today regarding [name] recent visit.  She says that she believes there is some mix up with his medication list from yeterday's appt and his last d/c from the hospital and would like to speak with someone about it.  Thanks.

## 2011-05-28 ENCOUNTER — Ambulatory Visit: Payer: Medicare Other | Admitting: Physician Assistant

## 2011-05-29 ENCOUNTER — Telehealth: Payer: Self-pay | Admitting: Internal Medicine

## 2011-05-29 NOTE — Telephone Encounter (Addendum)
[name] with [facility] calling to obtain order for a tranfer bench and a bedside toliet, pls call [name] [phone]

## 2011-05-30 ENCOUNTER — Encounter (HOSPITAL_COMMUNITY): Payer: Self-pay

## 2011-05-30 ENCOUNTER — Ambulatory Visit (HOSPITAL_COMMUNITY)
Admission: RE | Admit: 2011-05-30 | Discharge: 2011-05-30 | Disposition: A | Discharge status: Home / Self Care | Payer: PRIVATE HEALTH INSURANCE | Source: Ambulatory Visit | Attending: Internal Medicine | Admitting: Internal Medicine

## 2011-05-30 VITALS — BP 122/64 | HR 120 | Wt 186.0 lb

## 2011-05-30 DIAGNOSIS — I5022 Chronic systolic (congestive) heart failure: Secondary | ICD-10-CM

## 2011-05-30 LAB — BASIC METABOLIC PANEL
BUN: 18 mg/dL (ref 6–23)
Chloride: 100 mEq/L (ref 96–112)
Creatinine, Ser: 1.37 mg/dL — ABNORMAL HIGH (ref 0.50–1.35)
GFR calc Af Amer: 62 mL/min — ABNORMAL LOW (ref >90)

## 2011-05-30 MED ORDER — HYDRALAZINE HCL 25 MG PO TABS: 12.5000 mg | ORAL_TABLET | Freq: Three times a day (TID) | ORAL | Status: DC

## 2011-05-30 MED ORDER — ISOSORBIDE MONONITRATE ER 30 MG PO TB24: 30.0000 mg | ORAL_TABLET | Freq: Every day | ORAL | Status: DC

## 2011-05-30 MED ORDER — HYDRALAZINE HCL 10 MG PO TABS: 12.5000 mg | ORAL_TABLET | Freq: Three times a day (TID) | ORAL | Status: DC

## 2011-05-30 NOTE — Assessment & Plan Note (Addendum)
Feeling better but volume status remains elevated. Weight down 7 pounds but still with significant volume overload. Will not start ARB at this time due to renal insufficiency and need for further diuresis. Add hydralazine 12.5 mg tid and IMDUR 30 mg daily. Continue push diuresis. He will continue Metolazone 5 mg daily. Re-check bmet today and follow up next week.   Attending: Patient seen and examined with [name], NP. We discussed all aspects of the encounter. I agree with the assessment and plan as stated above. Volume status improving slowly. Will continue to push diuresis. Follow renal function closely. Discussed salt restriction again.

## 2011-05-30 NOTE — Progress Notes (Signed)
Patient ID: [name], male   DOB: 12-25-1945, 66 y.o.   MRN: [id number] Patient ID: [name], male   DOB: 01-07-1946, 66 y.o.   MRN: [id number] HPI:   [name] is a 66 year old male with chronic systolic CHF secondary to NICM echo EF 10-15% by echo 04/2011 (previously 30-35% 06/2010), HTN, anemia, chronic renal insufficiency Cr 1.31,  UGI bleed,  duodenal AVMs,  rectal bleeding 04/2011,  DVTs (2004, 2005, 2007, 2010, 2011) however coumadin on hold due to GI bleed, IVC filter 2005, GERD, BPD, and chronic low back .   Followed by Dr. Shirlee Latch though cared interrupted at times by non-compliance. He is here to establish in HF as part ofpost-hospitalization f/u. Discharged from [facility] 1/3 after admit for ADHF. Discharge weight 182 pounds. Discharged on Lasix 80 mg bid , Losartan 100 mg daily, Toprol XL 100 mg daily, and Spironolactone 25 mg daily. Says he demanded to leave because care on floor was bad.    05/26/11 Potassium 4.o Creatinine 1.25  He is here for follow up. Monday medications adjusted and we decreased Toprol XL to 50 mg daily,  Losartan not started,  digoxin 0.125 mg daily initiated. Metolazone 5 mg given daily for 5 days.  Will aggressively try and diurese however he understands he may require hospital admit if we cant remove fluid. Weight at home 182-190. Denies SOB/PND/orthopnea.  Lives alone. He has family bringing different food every day. [facility] RN, Nurse Aide and PT started 05/26/10. Lower extremity edema persists.  Smokes a pack of cigarettes every other day. He is adamant he will no longer take Coumadin due GI bleeding.  He is adamant that he will not return to the hospital.    ROS: All systems negative except as listed in HPI, PMH and Problem List.  Past Medical History  Diagnosis Date  . Venous thromboembolism     DVTs in 2004, 2005, 2007, 8/10, 8/11. IVC filter 2005. Off coumadin 04/2011 secondary to rectal bleeding  . AVM (arteriovenous malformation)     duodenal -- w/ GI bleed   . Chronic venous insufficiency   . DM2 (diabetes mellitus, type 2)   . GERD (gastroesophageal reflux disease)   . BPH (benign prostatic hypertrophy)   . Low back pain   . HTN (hypertension)   . HLD (hyperlipidemia)   . NICM (nonischemic cardiomyopathy) 1. 9/11  2. 2/12    1. Echo septal and apical akinesis, dilated LV, EF 45%, RV normal size and systolic function, EF 43% on Myoview  2. Admitted with decompensated CHF, echo EF 30-35% with diffuse hypokinesis but akinesis of mid to apical anteroseptal wall and apex, moderate LVH, mild MR, grade I diastolic dysfunction, SPEP/UP negative and HIV negative. Most recent EF 10-15% 04/2011  . Smoking   . CAD (coronary artery disease) 1. 2007  2. 10/11    1. Left heart cath with 40-50% stenosis in small RCA EF 50%  2. Lexiscan myoview EF 43%, global hypokinesis, possible small area of apical ischemia; LHC no angiographic CAD, no LV-gram done due to CKD   2. No angiographic CAD by cath 02/2010  . CKD (chronic kidney disease) stage 2, GFR 60-89 ml/min as of 02/2011  . Headache   . Diabetes mellitus   . Blood transfusion   . Anemia     Current Outpatient Prescriptions  Medication Sig Dispense Refill  . atorvastatin (LIPITOR) 20 MG tablet Take 20 mg by mouth daily.        Marland Kitchen  digoxin (LANOXIN) 0.125 MG tablet Take 1 tablet (125 mcg total) by mouth daily.  30 tablet  6  . furosemide (LASIX) 40 MG tablet Take 2 tablets (80 mg total) by mouth 2 (two) times daily.      Marland Kitchen glipiZIDE (GLUCOTROL XL) 5 MG 24 hr tablet Take 5 mg by mouth daily.        . insulin glargine (LANTUS) 100 UNIT/ML injection Inject 20 Units into the skin at bedtime.       . lansoprazole (PREVACID) 30 MG capsule Take 30 mg by mouth daily.        . metolazone (ZAROXOLYN) 5 MG tablet Take 1 tablet (5 mg total) by mouth daily.  10 tablet  2  . metoprolol (TOPROL-XL) 100 MG 24 hr tablet Take 0.5 tablets (50 mg total) by mouth daily.  30 tablet  6  . naphazoline (CLEAR EYES) 0.012 %  ophthalmic solution Place 2 drops into both eyes at bedtime as needed. For dry/itchy eyes       . potassium chloride SA (K-DUR,KLOR-CON) 20 MEQ tablet Take 2 tablets (40 mEq total) by mouth 2 (two) times daily.  120 tablet  1  . spironolactone (ALDACTONE) 25 MG tablet Take 1 tablet (25 mg total) by mouth daily.  30 tablet  2     PHYSICAL EXAM: Filed Vitals:   05/30/11 1017  BP: 122/64  Pulse: 120   Weight change:  186 (193 pounds) General:  Appears older than stated age. Fatigued. No resp difficulty  HEENT: normal except for poor dentition Neck: supple. JVP to jaw. Carotids 2+ bilaterally; no bruits. No lymphadenopathy or thryomegaly appreciated. Cor: PMI displaced laterally. Tachycardic  rate & rhythm. Prominent s3 Lungs: clear Abdomen: soft, nontender, + distended. No hepatosplenomegaly. No bruits or masses. Good bowel sounds. Extremities: no cyanosis, clubbing, rash, 3+ lower extremity edema into thighs Neuro: alert & orientedx3, cranial nerves grossly intact. Moves all 4 extremities w/o difficulty. Affect pleasant.    ASSESSMENT & PLAN:

## 2011-05-30 NOTE — Patient Instructions (Signed)
Take Hydralazine 12.5 mg three times a day  Take IMDUR 30 mg daily  Follow up next week  Do the following things EVERYDAY: 1) Weigh yourself in the morning before breakfast. Write it down and keep it in a log. 2) Take your medicines as prescribed 3) Eat low salt foods-Limit salt (sodium) to 2000mg  per day.  4) Stay as active as you can everyday  Limit your fluid intake . No more than 2 liters per day

## 2011-06-02 ENCOUNTER — Telehealth (HOSPITAL_COMMUNITY): Payer: Self-pay | Admitting: *Deleted

## 2011-06-02 MED ORDER — POTASSIUM CHLORIDE ER 10 MEQ PO TBCR: 40.0000 meq | EXTENDED_RELEASE_TABLET | Freq: Two times a day (BID) | ORAL | Status: DC

## 2011-06-02 NOTE — Telephone Encounter (Signed)
Spoke w/[name] on 1/14 see phone mess

## 2011-06-02 NOTE — Telephone Encounter (Signed)
Pateint having trouble taking potassium pill (too large)- Amy w/[facility] has seen patients taking capsule, can patient get capsule or smaller tablet, patient can't swallow pill even if she breaks pill in half.  Please call Amy.  Cam/06/02/11.

## 2011-06-02 NOTE — Telephone Encounter (Signed)
Looks like this is a [facility] pt. I will forward.

## 2011-06-02 NOTE — Telephone Encounter (Signed)
Spoke w/Amy new rx for kcl 10

## 2011-06-04 NOTE — Telephone Encounter (Signed)
I spoke w/pt's RN Amy 1/14

## 2011-06-05 ENCOUNTER — Telehealth: Payer: Self-pay | Admitting: Internal Medicine

## 2011-06-05 ENCOUNTER — Telehealth (HOSPITAL_COMMUNITY): Payer: Self-pay | Admitting: *Deleted

## 2011-06-05 NOTE — Telephone Encounter (Signed)
Spoke with [facility] and let them know Dr Johney Frame will not write these orders  I can forward to his PCP or Dr Shirlee Latch.  Not sure if Dr Shirlee Latch will do it either

## 2011-06-05 NOTE — Telephone Encounter (Signed)
He is being followed in the [facility] at [facility]. I will route this to [employer].

## 2011-06-05 NOTE — Telephone Encounter (Signed)
New msg [facility] wants verbal order for transfer bench and bedside commode

## 2011-06-05 NOTE — Telephone Encounter (Signed)
Amy called today regarding Mr Mancinas.  She would like orders for continuation of care and Mr Butterfield is concerned about the swelling in his legs.  Amy told him that is was due in part to his CHF, but he wanted her to let you know that he is concerned.  Amy stated that Mr Lynk is non-compliant with his medications at times.  She would like a call back at your earliest convenience.  Thanks!

## 2011-06-05 NOTE — Telephone Encounter (Signed)
Pt refuses to take evening Lasix, he is scheduled to see [place] tomorrow, have left a mess for Amy to call us back

## 2011-06-06 ENCOUNTER — Ambulatory Visit (HOSPITAL_COMMUNITY): Payer: PRIVATE HEALTH INSURANCE

## 2011-06-06 ENCOUNTER — Ambulatory Visit: Payer: Self-pay | Admitting: *Deleted

## 2011-06-06 DIAGNOSIS — I82409 Acute embolism and thrombosis of unspecified deep veins of unspecified lower extremity: Secondary | ICD-10-CM

## 2011-06-09 ENCOUNTER — Telehealth: Payer: Self-pay | Admitting: Cardiology

## 2011-06-09 NOTE — Telephone Encounter (Signed)
Pt is a Heart Failure Clinic pt.  Note will be routed to them.

## 2011-06-09 NOTE — Telephone Encounter (Signed)
New Problem:     Calling in to receive a verbal conformation for a transfer shower bench for the patient form Dr. Shirlee Latch. Please feel free to fax a response to [phone].

## 2011-06-10 ENCOUNTER — Encounter: Payer: Self-pay | Admitting: Internal Medicine

## 2011-06-10 NOTE — Telephone Encounter (Signed)
Spoke w/pt's nurse Amy, she will contact pt's pcp for order

## 2011-06-10 NOTE — Telephone Encounter (Signed)
See phone note dated 12/1

## 2011-06-10 NOTE — Telephone Encounter (Signed)
Spoke w/Amy she states pt's wt is up a lb he is scheduled to see [place] on Thur and is still refusing to take evening lasix, pt told her he doesn't seem to be going to the bathroom that much anymore she is going to go ahead and draw bmet and bnp

## 2011-06-12 ENCOUNTER — Ambulatory Visit (HOSPITAL_COMMUNITY)
Admission: RE | Admit: 2011-06-12 | Discharge: 2011-06-12 | Disposition: A | Discharge status: Home / Self Care | Payer: PRIVATE HEALTH INSURANCE | Source: Ambulatory Visit | Attending: Internal Medicine | Admitting: Internal Medicine

## 2011-06-12 ENCOUNTER — Encounter (HOSPITAL_COMMUNITY): Payer: Self-pay

## 2011-06-12 VITALS — BP 122/84 | HR 117 | Wt 194.0 lb

## 2011-06-12 DIAGNOSIS — I5022 Chronic systolic (congestive) heart failure: Secondary | ICD-10-CM | POA: Insufficient documentation

## 2011-06-12 DIAGNOSIS — N400 Enlarged prostate without lower urinary tract symptoms: Secondary | ICD-10-CM | POA: Insufficient documentation

## 2011-06-12 MED ORDER — TAMSULOSIN HCL 0.4 MG PO CAPS: 0.4000 mg | ORAL_CAPSULE | Freq: Every day | ORAL | Status: DC

## 2011-06-12 NOTE — Assessment & Plan Note (Signed)
Urinary hesitancy. Re-started Flomax 0.4 mg at bed time.

## 2011-06-12 NOTE — Progress Notes (Signed)
Patient ID: [name], male   DOB: Dec 22, 1945, 66 y.o.   MRN: [id number] Patient ID: [name], male   DOB: [birth year], 66 y.o.   MRN: [id number] Patient ID: [name], male   DOB: 04/15/1946, 66 y.o.   MRN: [id number] HPI:   [name] is a 66 year old male with chronic systolic CHF secondary to NICM echo EF 10-15% by echo 04/2011 (previously 30-35% 06/2010), HTN, anemia, chronic renal insufficiency Cr 1.31,  UGI bleed,  duodenal AVMs,  rectal bleeding 04/2011,  DVTs (2004, 2005, 2007, 2010, 2011) however coumadin on hold due to GI bleed, IVC filter 2005, GERD, BPD, and chronic low back .   Followed by Dr. Shirlee Latch though cared interrupted at times by non-compliance. He is here to establish in HF as part ofpost-hospitalization f/u. Discharged from [facility] 1/3 after admit for ADHF. Discharge weight 182 pounds. Discharged on Lasix 80 mg bid , Losartan 100 mg daily, Toprol XL 100 mg daily, and Spironolactone 25 mg daily. Says he demanded to leave because care on floor was bad.   05/26/11 Potassium 4.o Creatinine 1.25 06/10/11 Potassium 4.8 Creatinine 1.21  ProBNP 1311  He is here for follow up. He feels like breathing is better. Complains of lower extremity and groin pain. He also complains of urinary hesitancy which he had been on Flomax for but he stopped taking it. He has seen Dr Retta Diones in the past for BPH.  Last visit Hydralazine 12. 5 mg tid and IMDUR 30 mg daily initiated and he denies headaches. . He refuses to take evening dose of Lasix . He is taking Lasix 80 mg in am and Metolazone 5 mg in am.  Weight at home 188-193 pounds. Abdominal bloating.  Denies SOB/ Orthopnea/PND/dizziness.  Lives alone. He has family bringing different food every day. [facility] RN, Nurse Aide and PT started 05/26/10. He continues to use walker.  Lower extremity edema persists.  Stopped smoking about a week ago.  He is adamant he will no longer take Coumadin due GI bleeding.  He is adamant that he will not return to the  hospital.    ROS: All systems negative except as listed in HPI, PMH and Problem List.  Past Medical History  Diagnosis Date  . Venous thromboembolism     DVTs in 2004, 2005, 2007, 8/10, 8/11. IVC filter 2005. Off coumadin 04/2011 secondary to rectal bleeding  . 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 Chronic venous insufficiency   . DM2 (diabetes mellitus, type 2)   . GERD (gastroesophageal reflux disease)   . BPH (benign prostatic hypertrophy)   . Low back pain   . HTN (hypertension)   . HLD (hyperlipidemia)   . NICM (nonischemic cardiomyopathy) 1. 9/11  2. 2/12    1. Echo septal and apical akinesis, dilated LV, EF 45%, RV normal size and systolic function, EF 43% on Myoview  2. Admitted with decompensated CHF, echo EF 30-35% with diffuse hypokinesis but akinesis of mid to apical anteroseptal wall and apex, moderate LVH, mild MR, grade I diastolic dysfunction, SPEP/UP negative and HIV negative. Most recent EF 10-15% 04/2011  . Smoking   . CAD (coronary artery disease) 1. 2007  2. 10/11    1. Left heart cath with 40-50% stenosis in small RCA EF 50%  2. Lexiscan myoview EF 43%, global hypokinesis, possible small area of apical ischemia; LHC no angiographic CAD, no LV-gram done due to CKD   2.  No angiographic CAD by cath 02/2010  . CKD (chronic kidney disease) stage 2, GFR 60-89 ml/min as of 02/2011  . Headache   . Diabetes mellitus   . Blood transfusion   . Anemia     Current Outpatient Prescriptions  Medication Sig Dispense Refill  . atorvastatin (LIPITOR) 20 MG tablet Take 20 mg by mouth daily.        . digoxin (LANOXIN) 0.125 MG tablet Take 1 tablet (125 mcg total) by mouth daily.  30 tablet  6  . furosemide (LASIX) 40 MG tablet Take 2 tablets (80 mg total) by mouth 2 (two) times daily.      Marland Kitchen glipiZIDE (GLUCOTROL XL) 5 MG 24 hr tablet Take 5 mg by mouth daily.        . hydrALAZINE (APRESOLINE) 25 MG tablet Take 0.5 tablets (12.5 mg total) by mouth 3  (three) times daily.  45 tablet  6  . insulin glargine (LANTUS) 100 UNIT/ML injection Inject 20 Units into the skin at bedtime.       . isosorbide mononitrate (IMDUR) 30 MG 24 hr tablet Take 1 tablet (30 mg total) by mouth daily.  30 tablet  6  . lansoprazole (PREVACID) 30 MG capsule Take 30 mg by mouth daily.        . metolazone (ZAROXOLYN) 5 MG tablet Take 1 tablet (5 mg total) by mouth daily.  10 tablet  2  . metoprolol (TOPROL-XL) 100 MG 24 hr tablet Take 0.5 tablets (50 mg total) by mouth daily.  30 tablet  6  . naphazoline (CLEAR EYES) 0.012 % ophthalmic solution Place 2 drops into both eyes at bedtime as needed. For dry/itchy eyes       . potassium chloride (K-DUR) 10 MEQ tablet Take 4 tablets (40 mEq total) by mouth 2 (two) times daily.  240 tablet  6  . spironolactone (ALDACTONE) 25 MG tablet Take 1 tablet (25 mg total) by mouth daily.  30 tablet  2     PHYSICAL EXAM: Filed Vitals:   06/12/11 1156  BP: 122/84  Pulse: 117   Weight change: 194  (186) General:  Appears older than stated age. Fatigued. No resp difficulty  HEENT: normal except for poor dentition Neck: supple. JVP to jaw. Carotids 2+ bilaterally; no bruits. No lymphadenopathy or thryomegaly appreciated. Cor: PMI displaced laterally. Tachycardic  rate & rhythm. Prominent s3 Lungs: clear Abdomen: soft, nontender, + distended. No hepatosplenomegaly. No bruits or masses. Good bowel sounds. Extremities: no cyanosis, clubbing, rash, 3+ lower extremity edema into thighs Neuro: alert & orientedx3, cranial nerves grossly intact. Moves all 4 extremities w/o difficulty. Affect pleasant.    ASSESSMENT & PLAN:

## 2011-06-12 NOTE — Assessment & Plan Note (Signed)
Complaining of groin and leg pain. History of DVTs. He refuses coumadin. Will order lower extremity dopplers.

## 2011-06-12 NOTE — Patient Instructions (Addendum)
Take Lasix 80- mg  Twice a day  Take Flomax 0.4 mg daily at supper    Do the following things EVERYDAY: 1) Weigh yourself in the morning before breakfast. Write it down and keep it in a log. 2) Take your medicines as prescribed 3) Eat low salt foods-Limit salt (sodium) to 2000mg  per day.  4) Stay as active as you can everyday  Please schedule lower extremity dopplers today to rule out DVT  Follow up next Friday

## 2011-06-12 NOTE — Assessment & Plan Note (Addendum)
NYHA III. Volume status remains markedly elelvated. He likely has 20+ pounds of volume. He is not taking Lasix as ordered at this last visit.  Lengthy discussion regarding heart failure and medication compliance. Instructed to take 80 mg Lasix twice a day. He will continue Home Health.  He is agreeable to hospital admit next Friday if needed.   Patient seen and examined with [name], NP. We discussed all aspects of the encounter. I agree with the assessment and plan as stated above.  He has marked volume overload and really needs a inpatient tune-up possibly with inotropic support. After an extensive discussion he is finally willing to consider this for next week. We will resume his oral diuretics now and plan admit next week.

## 2011-06-18 ENCOUNTER — Encounter (HOSPITAL_COMMUNITY): Payer: Self-pay | Admitting: Adult Health

## 2011-06-18 ENCOUNTER — Ambulatory Visit (HOSPITAL_COMMUNITY)
Admission: RE | Admit: 2011-06-18 | Discharge: 2011-06-18 | Disposition: A | Discharge status: Home / Self Care | Payer: PRIVATE HEALTH INSURANCE | Source: Ambulatory Visit | Attending: Adult Health | Admitting: Adult Health

## 2011-06-18 DIAGNOSIS — N400 Enlarged prostate without lower urinary tract symptoms: Secondary | ICD-10-CM

## 2011-06-18 DIAGNOSIS — I82409 Acute embolism and thrombosis of unspecified deep veins of unspecified lower extremity: Secondary | ICD-10-CM

## 2011-06-18 DIAGNOSIS — M79609 Pain in unspecified limb: Secondary | ICD-10-CM | POA: Insufficient documentation

## 2011-06-18 DIAGNOSIS — Z86718 Personal history of other venous thrombosis and embolism: Secondary | ICD-10-CM | POA: Insufficient documentation

## 2011-06-18 DIAGNOSIS — M7989 Other specified soft tissue disorders: Secondary | ICD-10-CM

## 2011-06-18 NOTE — Progress Notes (Signed)
PRELIMINARY   PRELIMINARY  PRELIMINARY  Bilateral lower extremity venous duplex performed.  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  The right femoral vein was incompressible.  Unable to determine if this was from thrombus or from extremely elevated venous pressure.  All veins were very difficult to compress.  The left femoral vein was compressible, but again difficult to coapt.  Both popliteal veins were compressible and the calf vein were difficult to visualize.  Venous Doppler signals were extremely pulsatile.  Florestine Avers, RVT Chief Vascular Sonographer

## 2011-06-18 NOTE — Progress Notes (Unsigned)
Discussed ABI preliminary results with Mr Wert. Vascular sonograhper was unable to compress R thigh / femoral vein and this could represent fluid versus clot. Mr Leopard refused hospital admit and refused to wait and discuss with Dr Gala Romney. Mr Caldera complains of R thigh pain. Mr Schinke is scheduled for hospital admit Friday to diurese.

## 2011-06-20 ENCOUNTER — Encounter (HOSPITAL_COMMUNITY): Payer: Self-pay

## 2011-06-20 ENCOUNTER — Inpatient Hospital Stay (HOSPITAL_COMMUNITY)
Admission: RE | Admit: 2011-06-20 | Discharge: 2011-06-25 | DRG: 287 | Disposition: A | Discharge status: Home / Self Care | Payer: PRIVATE HEALTH INSURANCE | Source: Ambulatory Visit | Attending: Internal Medicine | Admitting: Internal Medicine

## 2011-06-20 VITALS — BP 114/76 | HR 109 | Temp 97.9°F | Resp 14 | Ht 72.0 in | Wt 159.2 lb

## 2011-06-20 DIAGNOSIS — I428 Other cardiomyopathies: Secondary | ICD-10-CM | POA: Diagnosis present

## 2011-06-20 DIAGNOSIS — K219 Gastro-esophageal reflux disease without esophagitis: Secondary | ICD-10-CM | POA: Diagnosis present

## 2011-06-20 DIAGNOSIS — R197 Diarrhea, unspecified: Secondary | ICD-10-CM

## 2011-06-20 DIAGNOSIS — Z86711 Personal history of pulmonary embolism: Secondary | ICD-10-CM

## 2011-06-20 DIAGNOSIS — I5023 Acute on chronic systolic (congestive) heart failure: Principal | ICD-10-CM | POA: Diagnosis present

## 2011-06-20 DIAGNOSIS — I82409 Acute embolism and thrombosis of unspecified deep veins of unspecified lower extremity: Secondary | ICD-10-CM

## 2011-06-20 DIAGNOSIS — Z794 Long term (current) use of insulin: Secondary | ICD-10-CM

## 2011-06-20 DIAGNOSIS — I509 Heart failure, unspecified: Secondary | ICD-10-CM | POA: Diagnosis present

## 2011-06-20 DIAGNOSIS — I5033 Acute on chronic diastolic (congestive) heart failure: Secondary | ICD-10-CM

## 2011-06-20 DIAGNOSIS — E785 Hyperlipidemia, unspecified: Secondary | ICD-10-CM | POA: Diagnosis present

## 2011-06-20 DIAGNOSIS — R0602 Shortness of breath: Secondary | ICD-10-CM

## 2011-06-20 DIAGNOSIS — F17200 Nicotine dependence, unspecified, uncomplicated: Secondary | ICD-10-CM | POA: Diagnosis present

## 2011-06-20 DIAGNOSIS — Z79899 Other long term (current) drug therapy: Secondary | ICD-10-CM

## 2011-06-20 DIAGNOSIS — I2789 Other specified pulmonary heart diseases: Secondary | ICD-10-CM | POA: Diagnosis present

## 2011-06-20 DIAGNOSIS — N179 Acute kidney failure, unspecified: Secondary | ICD-10-CM | POA: Diagnosis not present

## 2011-06-20 DIAGNOSIS — R000 Tachycardia, unspecified: Secondary | ICD-10-CM

## 2011-06-20 DIAGNOSIS — R609 Edema, unspecified: Secondary | ICD-10-CM

## 2011-06-20 DIAGNOSIS — I824Y9 Acute embolism and thrombosis of unspecified deep veins of unspecified proximal lower extremity: Secondary | ICD-10-CM | POA: Diagnosis present

## 2011-06-20 DIAGNOSIS — I5043 Acute on chronic combined systolic (congestive) and diastolic (congestive) heart failure: Secondary | ICD-10-CM

## 2011-06-20 DIAGNOSIS — E119 Type 2 diabetes mellitus without complications: Secondary | ICD-10-CM | POA: Diagnosis present

## 2011-06-20 DIAGNOSIS — N182 Chronic kidney disease, stage 2 (mild): Secondary | ICD-10-CM | POA: Diagnosis present

## 2011-06-20 DIAGNOSIS — I129 Hypertensive chronic kidney disease with stage 1 through stage 4 chronic kidney disease, or unspecified chronic kidney disease: Secondary | ICD-10-CM | POA: Diagnosis present

## 2011-06-20 DIAGNOSIS — Z86718 Personal history of other venous thrombosis and embolism: Secondary | ICD-10-CM

## 2011-06-20 LAB — PRO B NATRIURETIC PEPTIDE: Pro B Natriuretic peptide (BNP): 3661 pg/mL — ABNORMAL HIGH (ref 0–125)

## 2011-06-20 LAB — MAGNESIUM: Magnesium: 2 mg/dL (ref 1.5–2.5)

## 2011-06-20 LAB — COMPREHENSIVE METABOLIC PANEL
CO2: 25 mEq/L (ref 19–32)
Calcium: 8.9 mg/dL (ref 8.4–10.5)
Creatinine, Ser: 1.19 mg/dL (ref 0.50–1.35)
GFR calc Af Amer: 73 mL/min — ABNORMAL LOW (ref >90)
GFR calc non Af Amer: 63 mL/min — ABNORMAL LOW (ref >90)
Glucose, Bld: 61 mg/dL — ABNORMAL LOW (ref 70–99)

## 2011-06-20 LAB — CBC
Platelets: 219 10*3/uL (ref 150–400)
RDW: 23.7 % — ABNORMAL HIGH (ref 11.5–15.5)
WBC: 4.2 10*3/uL (ref 4.0–10.5)

## 2011-06-20 MED ORDER — INSULIN ASPART 100 UNIT/ML ~~LOC~~ SOLN
0.0000 [IU] | Freq: Three times a day (TID) | SUBCUTANEOUS | Status: DC
  Administered 2011-06-22: 1 [IU] via SUBCUTANEOUS
  Administered 2011-06-23: 2 [IU] via SUBCUTANEOUS
  Administered 2011-06-23 – 2011-06-24 (×2): 1 [IU] via SUBCUTANEOUS
  Filled 2011-06-20 (×2): qty 3

## 2011-06-20 MED ORDER — PANTOPRAZOLE SODIUM 40 MG PO TBEC
40.0000 mg | DELAYED_RELEASE_TABLET | Freq: Every day | ORAL | Status: DC
  Administered 2011-06-20 – 2011-06-25 (×6): 40 mg via ORAL
  Filled 2011-06-20 (×6): qty 1

## 2011-06-20 MED ORDER — POTASSIUM CHLORIDE CRYS ER 20 MEQ PO TBCR
40.0000 meq | EXTENDED_RELEASE_TABLET | Freq: Two times a day (BID) | ORAL | Status: DC
  Administered 2011-06-20 – 2011-06-24 (×9): 40 meq via ORAL
  Filled 2011-06-20 (×11): qty 2

## 2011-06-20 MED ORDER — ENOXAPARIN SODIUM 30 MG/0.3ML ~~LOC~~ SOLN
30.0000 mg | SUBCUTANEOUS | Status: DC
  Administered 2011-06-20 – 2011-06-22 (×3): 30 mg via SUBCUTANEOUS
  Filled 2011-06-20 (×4): qty 0.3

## 2011-06-20 MED ORDER — FUROSEMIDE 10 MG/ML IJ SOLN
80.0000 mg | Freq: Three times a day (TID) | INTRAMUSCULAR | Status: DC
  Administered 2011-06-20 – 2011-06-22 (×6): 80 mg via INTRAVENOUS
  Filled 2011-06-20 (×9): qty 8

## 2011-06-20 MED ORDER — METOPROLOL SUCCINATE ER 50 MG PO TB24
50.0000 mg | ORAL_TABLET | Freq: Every day | ORAL | Status: DC
Start: 2011-06-20 — End: 2011-06-25
  Administered 2011-06-20 – 2011-06-25 (×6): 50 mg via ORAL
  Filled 2011-06-20 (×6): qty 1

## 2011-06-20 MED ORDER — ONDANSETRON HCL 4 MG/2ML IJ SOLN
4.0000 mg | Freq: Four times a day (QID) | INTRAMUSCULAR | Status: DC | PRN
  Administered 2011-06-20: 4 mg via INTRAVENOUS
  Filled 2011-06-20: qty 2

## 2011-06-20 MED ORDER — DIGOXIN 125 MCG PO TABS
0.1250 mg | ORAL_TABLET | Freq: Every day | ORAL | Status: DC
  Administered 2011-06-20 – 2011-06-25 (×6): 0.125 mg via ORAL
  Filled 2011-06-20 (×6): qty 1

## 2011-06-20 MED ORDER — ALUM & MAG HYDROXIDE-SIMETH 200-200-20 MG/5ML PO SUSP
15.0000 mL | ORAL | Status: DC | PRN
  Administered 2011-06-20 – 2011-06-22 (×4): 30 mL via ORAL
  Filled 2011-06-20 (×5): qty 30

## 2011-06-20 MED ORDER — HYDRALAZINE HCL 25 MG PO TABS
12.5000 mg | ORAL_TABLET | Freq: Three times a day (TID) | ORAL | Status: DC
  Administered 2011-06-20: 12.5 mg via ORAL
  Administered 2011-06-20: 18:00:00 via ORAL
  Administered 2011-06-21 – 2011-06-22 (×6): 12.5 mg via ORAL
  Administered 2011-06-23 (×2): via ORAL
  Administered 2011-06-23: 12.5 mg via ORAL
  Administered 2011-06-24: 05:00:00 via ORAL
  Filled 2011-06-20 (×16): qty 0.5

## 2011-06-20 MED ORDER — SODIUM CHLORIDE 0.9 % IJ SOLN
3.0000 mL | Freq: Two times a day (BID) | INTRAMUSCULAR | Status: DC
  Administered 2011-06-21 – 2011-06-24 (×6): 3 mL via INTRAVENOUS

## 2011-06-20 MED ORDER — ROSUVASTATIN CALCIUM 20 MG PO TABS
20.0000 mg | ORAL_TABLET | Freq: Every day | ORAL | Status: DC
  Administered 2011-06-20 – 2011-06-24 (×5): 20 mg via ORAL
  Filled 2011-06-20 (×6): qty 1

## 2011-06-20 MED ORDER — INSULIN GLARGINE 100 UNIT/ML ~~LOC~~ SOLN
20.0000 [IU] | Freq: Every day | SUBCUTANEOUS | Status: DC
  Administered 2011-06-21 – 2011-06-24 (×4): 20 [IU] via SUBCUTANEOUS
  Filled 2011-06-20: qty 3

## 2011-06-20 MED ORDER — SPIRONOLACTONE 25 MG PO TABS
25.0000 mg | ORAL_TABLET | Freq: Every day | ORAL | Status: DC
  Administered 2011-06-20 – 2011-06-25 (×6): 25 mg via ORAL
  Filled 2011-06-20 (×6): qty 1

## 2011-06-20 MED ORDER — SODIUM CHLORIDE 0.9 % IV SOLN: 250.0000 mL | INTRAVENOUS | Status: DC | PRN

## 2011-06-20 MED ORDER — ISOSORBIDE MONONITRATE ER 30 MG PO TB24
30.0000 mg | ORAL_TABLET | Freq: Every day | ORAL | Status: DC
  Administered 2011-06-20 – 2011-06-25 (×6): 30 mg via ORAL
  Filled 2011-06-20 (×7): qty 1

## 2011-06-20 MED ORDER — GLIPIZIDE ER 5 MG PO TB24
5.0000 mg | ORAL_TABLET | Freq: Every day | ORAL | Status: DC
  Administered 2011-06-21 – 2011-06-24 (×3): 5 mg via ORAL
  Filled 2011-06-20 (×6): qty 1

## 2011-06-20 MED ORDER — SODIUM CHLORIDE 0.9 % IJ SOLN: 10.0000 mL | INTRAMUSCULAR | Status: DC | PRN

## 2011-06-20 MED ORDER — ACETAMINOPHEN 325 MG PO TABS: 650.0000 mg | ORAL_TABLET | ORAL | Status: DC | PRN

## 2011-06-20 MED ORDER — SODIUM CHLORIDE 0.9 % IJ SOLN: 3.0000 mL | INTRAMUSCULAR | Status: DC | PRN

## 2011-06-20 MED ORDER — TAMSULOSIN HCL 0.4 MG PO CAPS
0.4000 mg | ORAL_CAPSULE | Freq: Every day | ORAL | Status: DC
  Administered 2011-06-20 – 2011-06-24 (×5): 0.4 mg via ORAL
  Filled 2011-06-20 (×6): qty 1

## 2011-06-20 MED ORDER — ALUM & MAG HYDROXIDE-SIMETH 200-200-20 MG/5ML PO SUSP: 15.0000 mL | ORAL | Status: DC | PRN

## 2011-06-20 MED ORDER — SODIUM CHLORIDE 0.9 % IJ SOLN
10.0000 mL | Freq: Two times a day (BID) | INTRAMUSCULAR | Status: DC
  Administered 2011-06-20 – 2011-06-21 (×2): 10 mL
  Administered 2011-06-21: 40 mL
  Administered 2011-06-22 (×2): 10 mL
  Administered 2011-06-23: 40 mL
  Administered 2011-06-24 – 2011-06-25 (×3): 10 mL

## 2011-06-20 NOTE — H&P (Signed)
[facility]   HPI: [name] is a 66 year old male with chronic systolic CHF secondary to NICM echo EF 10-15% by echo 04/2011 (previously 30-35% 06/2010), HTN, anemia, chronic renal insufficiency Cr 1.31, UGI bleed, duodenal AVMs, rectal bleeding 04/2011, DVTs (2004, 2005, 2007, 2010, 2011) however coumadin on hold due to GI bleed, IVC filter 2005, GERD, BPD, and chronic low back .  He refuses Coumadin due GI bleeds in the past.   Followed by Dr. Shirlee Latch in the past , though cared interrupted at times by non-compliance. He was asked to follow up [facility] post-hospitalization.Marland Kitchen Discharged from [facility] 1/3 after admit for ADHF. Discharge weight 182 pounds. Discharged on Lasix 80 mg bid , Losartan 100 mg daily, Toprol XL 100 mg daily, and Spironolactone 25 mg daily. Says he demanded to leave because care on floor was bad. Followed closely in [facility] but continued to decline   06/18/11 ABI Unable to compress R femoral vein cannot exclude deep vein thrombosis involving the right femoral vein. Incidental findings are consistent with:enlarged lymph node on the right and enlarged lymph node on the left.- No obvious evidence of deep vein thrombosis involving the left lower extremity.    He is here for follow up and remains significantly volume overloaded. Weight continues to trend up. Weight is up 13 pounds from most recent discharge. Failed outpatient management with titration of Lasix and Metolazone. Continues to complain of R and LLE extremity edema.  Also complains of R thigh and buttock pain when ambulating. He will be admitted for IV diuretics but may require inotropes.  He is followed by [facility]     Review of Systems:     Cardiac Review of Systems: {Y] = yes [ ]  = no  Chest Pain [    ]  Resting SOB [   ] Exertional SOB  [ Y ]  Orthopnea [  ]   Pedal Edema [Y   ]    Palpitations [  ] Syncope  [  ]   Presyncope [   ]  General Review of Systems: [Y] = yes [   ]=no Constitional: recent weight change [ Y ]; anorexia [  ]; fatigue [Y  ]; nausea [  ]; night sweats [  ]; fever [  ]; or chills [  ];                                                                                                                                          Dental: poor dentition[  ]; Last Dentist visit:   Eye : blurred vision [  ]; diplopia [   ]; vision changes [  ];  Amaurosis fugax[  ]; Resp: cough [  ];  wheezing[  ];  hemoptysis[  ]; shortness of breath[  ]; paroxysmal nocturnal dyspnea[  ]; dyspnea on exertion[  ];  or orthopnea[  ];  GI:  gallstones[  ], vomiting[  ];  dysphagia[  ]; melena[  ];  hematochezia [  ]; heartburn[  ];   Hx of  Colonoscopy[  ]; GU: kidney stones [  ]; hematuria[  ];   dysuria [  ];  nocturia[  ];  history of     obstruction [  ];                 Skin: rash, swelling[  ];, hair loss[  ];  peripheral edema[  ];  or itching[  ]; Musculosketetal: myalgias[  ];  joint swelling[ Y ];  joint erythema[  ];  joint pain[  ];  back pain[  ];  Heme/Lymph: bruising[  ];  bleeding[  ];  anemia[  ];  Neuro: TIA[  ];  headaches[  ];  stroke[  ];  vertigo[  ];  seizures[  ];   paresthesias[  ];  difficulty walking[  ];  Psych:depression[  ]; anxiety[  ];  Endocrine: diabetes[  ];  thyroid dysfunction[  ];  Immunizations: Flu [  ]; Pneumococcal[  ];  Other:  Past Medical History  Diagnosis Date  . Venous thromboembolism     DVTs in 2004, 2005, 2007, 8/10, 8/11. IVC filter 2005. Off coumadin 04/2011 secondary to rectal bleeding  . 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 Chronic venous insufficiency   . DM2 (diabetes mellitus, type 2)   . GERD (gastroesophageal reflux disease)   . BPH (benign prostatic hypertrophy)   . Low back pain   . HTN (hypertension)   . HLD (hyperlipidemia)   . NICM (nonischemic cardiomyopathy) 1. 9/11  2. 2/12    1. Echo septal and apical akinesis, dilated LV, EF 45%, RV normal size and systolic function, EF  43% on Myoview  2. Admitted with decompensated CHF, echo EF 30-35% with diffuse hypokinesis but akinesis of mid to apical anteroseptal wall and apex, moderate LVH, mild MR, grade I diastolic dysfunction, SPEP/UP negative and HIV negative. Most recent EF 10-15% 04/2011  . Smoking   . CAD (coronary artery disease) 1. 2007  2. 10/11    1. Left heart cath with 40-50% stenosis in small RCA EF 50%  2. Lexiscan myoview EF 43%, global hypokinesis, possible small area of apical ischemia; LHC no angiographic CAD, no LV-gram done due to CKD   2. No angiographic CAD by cath 02/2010  . CKD (chronic kidney disease) stage 2, GFR 60-89 ml/min as of 02/2011  . Headache   . Diabetes mellitus   . Blood transfusion   . Anemia     No current facility-administered medications on file as of .   Medications Prior to Admission  Medication Sig Dispense Refill  . atorvastatin (LIPITOR) 20 MG tablet Take 20 mg by mouth daily.        . digoxin (LANOXIN) 0.125 MG tablet Take 1 tablet (125 mcg total) by mouth daily.  30 tablet  6  . furosemide (LASIX) 40 MG tablet Take 2 tablets (80 mg total) by mouth 2 (two) times daily.      Marland Kitchen glipiZIDE (GLUCOTROL XL) 5 MG 24 hr tablet Take 5 mg by mouth daily.        . hydrALAZINE (APRESOLINE) 25 MG tablet Take 0.5 tablets (12.5 mg total) by mouth 3 (three) times daily.  45 tablet  6  . insulin glargine (LANTUS) 100 UNIT/ML injection Inject 20 Units into  the skin at bedtime.       . isosorbide mononitrate (IMDUR) 30 MG 24 hr tablet Take 1 tablet (30 mg total) by mouth daily.  30 tablet  6  . lansoprazole (PREVACID) 30 MG capsule Take 30 mg by mouth daily.        . metolazone (ZAROXOLYN) 5 MG tablet Take 1 tablet (5 mg total) by mouth daily.  10 tablet  2  . metoprolol (TOPROL-XL) 100 MG 24 hr tablet Take 0.5 tablets (50 mg total) by mouth daily.  30 tablet  6  . naphazoline (CLEAR EYES) 0.012 % ophthalmic solution Place 2 drops into both eyes at bedtime as needed. For dry/itchy eyes        . potassium chloride (K-DUR) 10 MEQ tablet Take 4 tablets (40 mEq total) by mouth 2 (two) times daily.  240 tablet  6  . spironolactone (ALDACTONE) 25 MG tablet Take 1 tablet (25 mg total) by mouth daily.  30 tablet  2  . Tamsulosin HCl (FLOMAX) 0.4 MG CAPS Take 1 capsule (0.4 mg total) by mouth at bedtime.  30 capsule  6     No Known Allergies . History   Social History  . Marital Status: Divorced    Spouse Name: N/A    Number of Children: 1  . Years of Education: N/A   Occupational History  . security guard     Part time   Social History Main Topics  . Smoking status: Current Everyday Smoker -- 1.0 packs/day for 40 years    Types: Cigarettes  . Smokeless tobacco: Never Used  . Alcohol Use: No  . Drug Use: No  . Sexually Active: Not on file   Other Topics Concern  . Not on file   Social History Narrative   Has 1 son.    Family History  Problem Relation Age of Onset  . Coronary artery disease Neg Hx     Premature    PHYSICAL EXAM: Filed Vitals:   06/20/11 1217  BP: 116/82  Pulse: 117   General: Chronically ill  appearing. No respiratory difficulty HEENT: normal Neck: supple.JVD to ear. Carotids 2+ bilat; no bruits. No lymphadenopathy or thryomegaly appreciated. Cor: PMI nondisplaced. Tachycardic rate & rhythm. No rubs,S3 or murmurs. Lungs: clear Abdomen: soft, nontender, distended. No hepatosplenomegaly. No bruits or masses. Good bowel sounds. Extremities: no cyanosis, clubbing, rash, R and LLE edema 3+ Neuro: alert & oriented x 3, cranial nerves grossly intact. moves all 4 extremities w/o difficulty. Affect pleasant.  ECG:  No results found for this or any previous visit (from the past 24 hour(s)). No results found.   ASSESSMENT: 1. Acute/ Chronic systolic/diastolic heart failure 2. DM 3. Acute/ Chronic Renal Failure (creatinine 1.3) 4. DVT/PE  Filter 2005 (refuses coumadin due to GI bleed) 5. HTN 6. BPH 7.  Hyperlipidemia   PLAN/DISCUSSION: He is markedly volume overloaded and not responding to outpatient therapy. Will admit to step down for inotropic support and diuresis. We discussed possibility of UF but he would like to see if the IV medicines work first. Recent lower extremity ultrasound. L compressible but R non compressible. Will diurese and repeat ultrasound.   Colene Mines 06/20/2011 12:54

## 2011-06-20 NOTE — Assessment & Plan Note (Signed)
He is markedly volume overloaded and not responding to outpatient therapy. Will admit for inotropic support and diuresis. We discussed possibility of UF but he would like to see if the IV medicines work first.

## 2011-06-20 NOTE — Progress Notes (Signed)
Patient ID: [name], male   DOB: 1945/07/31, 66 y.o.   MRN: [id number] HPI: Mr. Feuerborn is a 66 year old male with chronic systolic CHF secondary to NICM echo EF 10-15% by echo 04/2011 (previously 30-35% 06/2010), HTN, anemia, chronic renal insufficiency Cr 1.31, UGI bleed, duodenal AVMs, rectal bleeding 04/2011, DVTs (2004, 2005, 2007, 2010, 2011) however coumadin on hold due to GI bleed, IVC filter 2005, GERD, BPD, and chronic low back .   Followed by Dr. Shirlee Latch, though cared interrupted at times by non-compliance. He is here to establish in HF as part o fpost-hospitalization f/u. Discharged from [facility] 1/3 after admit for ADHF. Discharge weight 182 pounds. Discharged on Lasix 80 mg bid , Losartan 100 mg daily, Toprol XL 100 mg daily, and Spironolactone 25 mg daily. Says he demanded to leave because care on floor was bad.    06/18/11 ABI Unable to compress R femoral vein cannot exclude deep vein thrombosis involving the right femoral vein. Incidental findings are consistent with:enlarged lymph node on the right and enlarged lymph node on the left.- No obvious evidence of deep vein thrombosis involving the left lower extremity.    He is here for follow up and remains significantly volume overloaded. Weight continues to trend up. Weight is up 13 pounds from most recent discharge. Failed outpatient management with titration of Lasix and Metolazone. Continues to complain of R and LLE extremity edema. Followed by [facility] . He will be admitted for IV diuretics but may require inotropes.    ROS: All systems negative except as listed in HPI, PMH and Problem List.  Past Medical History  Diagnosis Date  . Venous thromboembolism     DVTs in 2004, 2005, 2007, 8/10, 8/11. IVC filter 2005. Off coumadin 04/2011 secondary to rectal bleeding  . 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 Chronic venous insufficiency   . DM2 (diabetes mellitus, type 2)   . GERD (gastroesophageal  reflux disease)   . BPH (benign prostatic hypertrophy)   . Low back pain   . HTN (hypertension)   . HLD (hyperlipidemia)   . NICM (nonischemic cardiomyopathy) 1. 9/11  2. 2/12    1. Echo septal and apical akinesis, dilated LV, EF 45%, RV normal size and systolic function, EF 43% on Myoview  2. Admitted with decompensated CHF, echo EF 30-35% with diffuse hypokinesis but akinesis of mid to apical anteroseptal wall and apex, moderate LVH, mild MR, grade I diastolic dysfunction, SPEP/UP negative and HIV negative. Most recent EF 10-15% 04/2011  . Smoking   . CAD (coronary artery disease) 1. 2007  2. 10/11    1. Left heart cath with 40-50% stenosis in small RCA EF 50%  2. Lexiscan myoview EF 43%, global hypokinesis, possible small area of apical ischemia; LHC no angiographic CAD, no LV-gram done due to CKD   2. No angiographic CAD by cath 02/2010  . CKD (chronic kidney disease) stage 2, GFR 60-89 ml/min as of 02/2011  . Headache   . Diabetes mellitus   . Blood transfusion   . Anemia    History   Social History  . Marital Status: Divorced    Spouse Name: N/A    Number of Children: 1  . Years of Education: N/A   Occupational History  . security guard     Part time   Social History Main Topics  . Smoking status: Current Everyday Smoker -- 1.0 packs/day for 40 years  Types: Cigarettes  . Smokeless tobacco: Never Used  . Alcohol Use: No  . Drug Use: No  . Sexually Active: Not on file   Other Topics Concern  . Not on file   Social History Narrative   Has 1 son.   Family History  Problem Relation Age of Onset  . Coronary artery disease Neg Hx     Premature     Current Outpatient Prescriptions  Medication Sig Dispense Refill  . atorvastatin (LIPITOR) 20 MG tablet Take 20 mg by mouth daily.        . digoxin (LANOXIN) 0.125 MG tablet Take 1 tablet (125 mcg total) by mouth daily.  30 tablet  6  . furosemide (LASIX) 40 MG tablet Take 2 tablets (80 mg total) by mouth 2 (two) times  daily.      Marland Kitchen glipiZIDE (GLUCOTROL XL) 5 MG 24 hr tablet Take 5 mg by mouth daily.        . hydrALAZINE (APRESOLINE) 25 MG tablet Take 0.5 tablets (12.5 mg total) by mouth 3 (three) times daily.  45 tablet  6  . insulin glargine (LANTUS) 100 UNIT/ML injection Inject 20 Units into the skin at bedtime.       . isosorbide mononitrate (IMDUR) 30 MG 24 hr tablet Take 1 tablet (30 mg total) by mouth daily.  30 tablet  6  . lansoprazole (PREVACID) 30 MG capsule Take 30 mg by mouth daily.        . metolazone (ZAROXOLYN) 5 MG tablet Take 1 tablet (5 mg total) by mouth daily.  10 tablet  2  . metoprolol (TOPROL-XL) 100 MG 24 hr tablet Take 0.5 tablets (50 mg total) by mouth daily.  30 tablet  6  . naphazoline (CLEAR EYES) 0.012 % ophthalmic solution Place 2 drops into both eyes at bedtime as needed. For dry/itchy eyes       . potassium chloride (K-DUR) 10 MEQ tablet Take 4 tablets (40 mEq total) by mouth 2 (two) times daily.  240 tablet  6  . spironolactone (ALDACTONE) 25 MG tablet Take 1 tablet (25 mg total) by mouth daily.  30 tablet  2  . Tamsulosin HCl (FLOMAX) 0.4 MG CAPS Take 1 capsule (0.4 mg total) by mouth at bedtime.  30 capsule  6     PHYSICAL EXAM: Filed Vitals:   06/20/11 1217  BP: 116/82  Pulse: 117   Weight change:   Weight 295 pounds ( 294) General:  Chronically ill appearing. No resp difficulty HEENT: normal Neck: supple. JVP to ear. Carotids 2+ bilaterally; no bruits. No lymphadenopathy or thryomegaly appreciated. Cor: PMI laterally displace. Tachy regular +s3  Lungs: clear Abdomen: soft, nontender, + distended. No hepatosplenomegaly. No bruits or masses. Good bowel sounds. Extremities: no cyanosis, clubbing, rash, R and L thigh 4+edema Neuro: alert & orientedx3, cranial nerves grossly intact. Moves all 4 extremities w/o difficulty. Affect pleasant.   ASSESSMENT & PLAN:

## 2011-06-20 NOTE — Assessment & Plan Note (Signed)
Has h/o multiple DVTs and PEs. Refuses coumadin due to GIB. Recent u/s LE vessels compressible on L but non-compressible on R. Given amount of fluid in his legs will diureses and then plan on repeating u/s.

## 2011-06-20 NOTE — Progress Notes (Signed)
Encounter addended by: [name], NP on: 06/20/2011  1:35 PM<BR>     Documentation filed: Charting, Inpatient Notes, Orders, PRL Based Order Sets

## 2011-06-21 ENCOUNTER — Inpatient Hospital Stay (HOSPITAL_COMMUNITY): Payer: PRIVATE HEALTH INSURANCE

## 2011-06-21 DIAGNOSIS — R197 Diarrhea, unspecified: Secondary | ICD-10-CM

## 2011-06-21 DIAGNOSIS — I5043 Acute on chronic combined systolic (congestive) and diastolic (congestive) heart failure: Secondary | ICD-10-CM

## 2011-06-21 DIAGNOSIS — I82409 Acute embolism and thrombosis of unspecified deep veins of unspecified lower extremity: Secondary | ICD-10-CM

## 2011-06-21 LAB — CARBOXYHEMOGLOBIN
Methemoglobin: 1.4 % (ref 0.0–1.5)
Total hemoglobin: 10 g/dL — ABNORMAL LOW (ref 13.5–18.0)
Total hemoglobin: 9.7 g/dL — ABNORMAL LOW (ref 13.5–18.0)

## 2011-06-21 LAB — TSH
TSH: 1.121 u[IU]/mL (ref 0.350–4.500)
TSH: 0.922 u[IU]/mL (ref 0.350–4.500)

## 2011-06-21 LAB — CLOSTRIDIUM DIFFICILE BY PCR: Toxigenic C. Difficile by PCR: NEGATIVE

## 2011-06-21 LAB — BASIC METABOLIC PANEL
BUN: 15 mg/dL (ref 6–23)
Calcium: 8.9 mg/dL (ref 8.4–10.5)
Chloride: 103 mEq/L (ref 96–112)
Creatinine, Ser: 1.28 mg/dL (ref 0.50–1.35)
GFR calc Af Amer: 67 mL/min — ABNORMAL LOW (ref >90)

## 2011-06-21 LAB — HEMOGLOBIN A1C
Hgb A1c MFr Bld: 6.2 % — ABNORMAL HIGH (ref <5.7)
Mean Plasma Glucose: 131 mg/dL — ABNORMAL HIGH (ref <117)

## 2011-06-21 LAB — GLUCOSE, CAPILLARY
Glucose-Capillary: 73 mg/dL (ref 70–99)
Glucose-Capillary: 84 mg/dL (ref 70–99)
Glucose-Capillary: 75 mg/dL (ref 70–99)
Glucose-Capillary: 136 mg/dL — ABNORMAL HIGH (ref 70–99)

## 2011-06-21 LAB — T4, FREE: Free T4: 1.25 ng/dL (ref 0.80–1.80)

## 2011-06-21 MED ORDER — DIGOXIN 125 MCG PO TABS: 0.1250 mg | ORAL_TABLET | Freq: Every day | ORAL | Status: DC

## 2011-06-21 MED ORDER — LOPERAMIDE HCL 1 MG/5ML PO LIQD
2.0000 mg | ORAL | Status: DC | PRN
  Administered 2011-06-21: 2 mg via ORAL
  Filled 2011-06-21 (×2): qty 10

## 2011-06-21 NOTE — Progress Notes (Signed)
Patient seen and examined with Amy Clegg, NP. We discussed all aspects of the encounter. I agree with the assessment and plan as stated above.   

## 2011-06-21 NOTE — Progress Notes (Addendum)
Patient ID: [name], male   DOB: 10-08-1945, 66 y.o.   MRN: [id number]  HPI: [name] is a 66 year old male with chronic systolic CHF secondary to NICM echo EF 10-15% by echo 04/2011 (previously 30-35% 06/2010), HTN, anemia, chronic renal insufficiency Cr 1.31, UGI bleed, duodenal AVMs, rectal bleeding 04/2011, DVTs (2004, 2005, 2007, 2010, 2011) however coumadin on hold due to GI bleed, IVC filter 2005, GERD, BPD, and chronic low back pain .    06/18/11 ABI Unable to compress R femoral vein cannot exclude deep vein thrombosis involving the right femoral vein. Incidental findings are consistent with:enlarged lymph node on the right and enlarged lymph node on the left.- No obvious evidence of deep vein thrombosis involving the left lower extremity.   Admitted yesterday for diuresis due to massive volume overload. PICC line placed. Now down about 3L. Having diarrhea - c.diff negative. Breathing better. Still with massive edema. Remains tachy. HR 120 range. Weight down 6 pounds. Co-ox 65,   ROS: All systems negative except as listed in HPI, PMH and Problem List.  Past Medical History  Diagnosis Date  . Venous thromboembolism     DVTs in 2004, 2005, 2007, 8/10, 8/11. IVC filter 2005. Off coumadin 04/2011 secondary to rectal bleeding  . 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 Chronic venous insufficiency   . DM2 (diabetes mellitus, type 2)   . GERD (gastroesophageal reflux disease)   . BPH (benign prostatic hypertrophy)   . Low back pain   . HTN (hypertension)   . HLD (hyperlipidemia)   . NICM (nonischemic cardiomyopathy) 1. 9/11  2. 2/12    1. Echo septal and apical akinesis, dilated LV, EF 45%, RV normal size and systolic function, EF 43% on Myoview  2. Admitted with decompensated CHF, echo EF 30-35% with diffuse hypokinesis but akinesis of mid to apical anteroseptal wall and apex, moderate LVH, mild MR, grade I diastolic dysfunction, SPEP/UP negative and HIV  negative. Most recent EF 10-15% 04/2011  . Smoking   . CAD (coronary artery disease) 1. 2007  2. 10/11    1. Left heart cath with 40-50% stenosis in small RCA EF 50%  2. Lexiscan myoview EF 43%, global hypokinesis, possible small area of apical ischemia; LHC no angiographic CAD, no LV-gram done due to CKD   2. No angiographic CAD by cath 02/2010  . CKD (chronic kidney disease) stage 2, GFR 60-89 ml/min as of 02/2011  . Headache   . Diabetes mellitus   . Blood transfusion   . Anemia    History   Social History  . Marital Status: Divorced    Spouse Name: N/A    Number of Children: 1  . Years of Education: N/A   Occupational History  . security guard     Part time   Social History Main Topics  . Smoking status: Current Everyday Smoker -- 1.0 packs/day for 40 years    Types: Cigarettes  . Smokeless tobacco: Never Used  . Alcohol Use: No  . Drug Use: No  . Sexually Active: Not on file   Other Topics Concern  . Not on file   Social History Narrative   Has 1 son.   Family History  Problem Relation Age of Onset  . Coronary artery disease Neg Hx     Premature     Current Facility-Administered Medications  Medication Dose Route Frequency Provider Last Rate Last Dose  . 0.9 %  sodium chloride infusion  250 mL Intravenous PRN Amy Clegg, NP      . acetaminophen (TYLENOL) tablet 650 mg  650 mg Oral Q4H PRN Amy Clegg, NP      . alum & mag hydroxide-simeth (MAALOX/MYLANTA) 200-200-20 MG/5ML suspension 15-30 mL  15-30 mL Oral Q2H PRN Dolores Patty, MD   30 mL at 06/21/11 0158  . digoxin (LANOXIN) tablet 0.125 mg  0.125 mg Oral Daily Amy Clegg, NP   0.125 mg at 06/21/11 0905  . enoxaparin (LOVENOX) injection 30 mg  30 mg Subcutaneous Q24H Amy Clegg, NP   30 mg at 06/20/11 2146  . furosemide (LASIX) injection 80 mg  80 mg Intravenous Q8H Amy Clegg, NP   80 mg at 06/21/11 0548  . glipiZIDE (GLUCOTROL XL) 24 hr tablet 5 mg  5 mg Oral Q breakfast Amy Clegg, NP   5 mg at 06/21/11  0831  . hydrALAZINE (APRESOLINE) tablet 12.5 mg  12.5 mg Oral Q8H Amy Clegg, NP   12.5 mg at 06/21/11 0631  . insulin aspart (novoLOG) injection 0-9 Units  0-9 Units Subcutaneous TID WC Amy Clegg, NP      . insulin glargine (LANTUS) injection 20 Units  20 Units Subcutaneous QHS Amy Clegg, NP      . isosorbide mononitrate (IMDUR) 24 hr tablet 30 mg  30 mg Oral Daily Amy Clegg, NP   30 mg at 06/21/11 0905  . metoprolol succinate (TOPROL-XL) 24 hr tablet 50 mg  50 mg Oral Daily Amy Clegg, NP   50 mg at 06/21/11 0905  . ondansetron (ZOFRAN) injection 4 mg  4 mg Intravenous Q6H PRN [name], NP   4 mg at 06/20/11 2150  . pantoprazole (PROTONIX) EC tablet 40 mg  40 mg Oral Q1200 Amy Clegg, NP   40 mg at 06/21/11 0830  . potassium chloride SA (K-DUR,KLOR-CON) CR tablet 40 mEq  40 mEq Oral BID [name], NP   40 mEq at 06/21/11 0905  . rosuvastatin (CRESTOR) tablet 20 mg  20 mg Oral q1800 Amy Clegg, NP   20 mg at 06/20/11 1708  . sodium chloride 0.9 % injection 10-40 mL  10-40 mL Intracatheter Q12H Dolores Patty, MD   10 mL at 06/20/11 2155  . sodium chloride 0.9 % injection 10-40 mL  10-40 mL Intracatheter PRN Dolores Patty, MD      . sodium chloride 0.9 % injection 3 mL  3 mL Intravenous Q12H Amy Clegg, NP   3 mL at 06/21/11 0906  . sodium chloride 0.9 % injection 3 mL  3 mL Intravenous PRN Amy Clegg, NP      . spironolactone (ALDACTONE) tablet 25 mg  25 mg Oral Daily Amy Clegg, NP   25 mg at 06/21/11 0906  . Tamsulosin HCl (FLOMAX) capsule 0.4 mg  0.4 mg Oral QHS Amy Clegg, NP   0.4 mg at 06/20/11 2129  . DISCONTD: alum & mag hydroxide-simeth (MAALOX/MYLANTA) 200-200-20 MG/5ML suspension 15-30 mL  15-30 mL Oral Q2H PRN Dolores Patty, MD         PHYSICAL EXAM: Filed Vitals:   06/21/11 1203  BP: 127/92  Pulse:   Temp:   Resp:     General:  Chronically ill appearing. No resp difficulty HEENT: normal Neck: supple. JVP to ear. Carotids 2+ bilaterally; no bruits. No  lymphadenopathy or thryomegaly appreciated. Cor: PMI laterally displacde. Tachy regular +s3  Lungs: clear Abdomen: soft, nontender, + distended. No hepatosplenomegaly. No bruits or  masses. Good bowel sounds. Extremities: no cyanosis, clubbing, rash, R and L thigh 2+edema Neuro: alert & orientedx3, cranial nerves grossly intact. Moves all 4 extremities w/o difficulty. Affect pleasant.  Results for orders placed during the hospital encounter of 06/20/11 (from the past 48 hour(s))  COMPREHENSIVE METABOLIC PANEL     Status: Abnormal   Collection Time   06/20/11  3:30 PM      Component Value Range Comment   Sodium 136  135 - 145 (mEq/L)    Potassium 4.0  3.5 - 5.1 (mEq/L)    Chloride 105  96 - 112 (mEq/L)    CO2 25  19 - 32 (mEq/L)    Glucose, Bld 61 (*) 70 - 99 (mg/dL)    BUN 15  6 - 23 (mg/dL)    Creatinine, Ser 1.61  0.50 - 1.35 (mg/dL)    Calcium 8.9  8.4 - 10.5 (mg/dL)    Total Protein 7.9  6.0 - 8.3 (g/dL)    Albumin 2.5 (*) 3.5 - 5.2 (g/dL)    AST 21  0 - 37 (U/L)    ALT 17  0 - 53 (U/L)    Alkaline Phosphatase 230 (*) 39 - 117 (U/L)    Total Bilirubin 1.8 (*) 0.3 - 1.2 (mg/dL)    GFR calc non Af Amer 63 (*) >90 (mL/min)    GFR calc Af Amer 73 (*) >90 (mL/min)   MAGNESIUM     Status: Normal   Collection Time   06/20/11  3:30 PM      Component Value Range Comment   Magnesium 2.0  1.5 - 2.5 (mg/dL)   TSH     Status: Normal   Collection Time   06/20/11  3:30 PM      Component Value Range Comment   TSH 1.121  0.350 - 4.500 (uIU/mL)   CBC     Status: Abnormal   Collection Time   06/20/11  3:30 PM      Component Value Range Comment   WBC 4.2  4.0 - 10.5 (K/uL)    RBC 5.06  4.22 - 5.81 (MIL/uL)    Hemoglobin 9.6 (*) 13.0 - 17.0 (g/dL)    HCT 09.6 (*) 04.5 - 52.0 (%)    MCV 68.4 (*) 78.0 - 100.0 (fL)    MCH 19.0 (*) 26.0 - 34.0 (pg)    MCHC 27.7 (*) 30.0 - 36.0 (g/dL)    RDW 40.9 (*) 81.1 - 15.5 (%)    Platelets 219  150 - 400 (K/uL)   HEMOGLOBIN A1C     Status: Abnormal    Collection Time   06/20/11  3:30 PM      Component Value Range Comment   Hemoglobin A1C 6.2 (*) <5.7 (%)    Mean Plasma Glucose 131 (*) <117 (mg/dL)   PRO B NATRIURETIC PEPTIDE     Status: Abnormal   Collection Time   06/20/11  3:30 PM      Component Value Range Comment   Pro B Natriuretic peptide (BNP) 3661.0 (*) 0 - 125 (pg/mL)   GLUCOSE, CAPILLARY     Status: Normal   Collection Time   06/20/11  4:14 PM      Component Value Range Comment   Glucose-Capillary 76  70 - 99 (mg/dL)   GLUCOSE, CAPILLARY     Status: Abnormal   Collection Time   06/20/11  9:51 PM      Component Value Range Comment   Glucose-Capillary 67 (*) 70 - 99 (  mg/dL)    Comment 1 Notify RN     GLUCOSE, CAPILLARY     Status: Normal   Collection Time   06/20/11 10:12 PM      Component Value Range Comment   Glucose-Capillary 71  70 - 99 (mg/dL)   GLUCOSE, CAPILLARY     Status: Normal   Collection Time   06/20/11 11:23 PM      Component Value Range Comment   Glucose-Capillary 73  70 - 99 (mg/dL)   CARBOXYHEMOGLOBIN     Status: Abnormal   Collection Time   06/21/11 12:20 AM      Component Value Range Comment   Total hemoglobin 10.0 (*) 13.5 - 18.0 (g/dL)    O2 Saturation 16.1      Carboxyhemoglobin 2.3 (*) 0.5 - 1.5 (%)    Methemoglobin 0.5  0.0 - 1.5 (%)   MRSA PCR SCREENING     Status: Normal   Collection Time   06/21/11 12:39 AM      Component Value Range Comment   MRSA by PCR NEGATIVE  NEGATIVE    CLOSTRIDIUM DIFFICILE BY PCR     Status: Normal   Collection Time   06/21/11  2:55 AM      Component Value Range Comment   C difficile by pcr NEGATIVE  NEGATIVE    BASIC METABOLIC PANEL     Status: Abnormal   Collection Time   06/21/11  4:10 AM      Component Value Range Comment   Sodium 135  135 - 145 (mEq/L)    Potassium 4.1  3.5 - 5.1 (mEq/L)    Chloride 103  96 - 112 (mEq/L)    CO2 25  19 - 32 (mEq/L)    Glucose, Bld 95  70 - 99 (mg/dL)    BUN 15  6 - 23 (mg/dL)    Creatinine, Ser 0.96  0.50 - 1.35 (mg/dL)     Calcium 8.9  8.4 - 10.5 (mg/dL)    GFR calc non Af Amer 58 (*) >90 (mL/min)    GFR calc Af Amer 67 (*) >90 (mL/min)   CARBOXYHEMOGLOBIN     Status: Abnormal   Collection Time   06/21/11  4:10 AM      Component Value Range Comment   Total hemoglobin 9.7 (*) 13.5 - 18.0 (g/dL)    O2 Saturation 04.5      Carboxyhemoglobin 2.7 (*) 0.5 - 1.5 (%)    Methemoglobin 1.4  0.0 - 1.5 (%)   GLUCOSE, CAPILLARY     Status: Normal   Collection Time   06/21/11  7:49 AM      Component Value Range Comment   Glucose-Capillary 84  70 - 99 (mg/dL)   GLUCOSE, CAPILLARY     Status: Normal   Collection Time   06/21/11 12:03 PM      Component Value Range Comment   Glucose-Capillary 79  70 - 99 (mg/dL)     ASSESSMENT:  1. A/C systolic HF with massive volume overload 2. NICM EF 15% 3. Multiple PEs/DVTs s/p IVC filter          --refuses coumadin due to h/o GIB (AVMs0 4. DM2 5. Diarrhea  PLAN/DISCUSSION:  Diuresing well on IV lasix but still with significant volume overload. HR remains elevated. Will add digoxin. May need milrinone though co-ox currently OK.   C. Diff negative. Will check O&P and stool culture. Start immodium. Check TSH.   Refuses anti-coagulation for DVT/PE. Will need repeat u/s  LE once diuresed.   Time spent 40 mins   Maelani Yarbro,MD 1:12 PM

## 2011-06-22 LAB — GLUCOSE, CAPILLARY
Glucose-Capillary: 52 mg/dL — ABNORMAL LOW (ref 70–99)
Glucose-Capillary: 78 mg/dL (ref 70–99)
Glucose-Capillary: 105 mg/dL — ABNORMAL HIGH (ref 70–99)
Glucose-Capillary: 142 mg/dL — ABNORMAL HIGH (ref 70–99)

## 2011-06-22 LAB — BASIC METABOLIC PANEL
BUN: 16 mg/dL (ref 6–23)
CO2: 28 mEq/L (ref 19–32)
Calcium: 9.1 mg/dL (ref 8.4–10.5)
Creatinine, Ser: 1.51 mg/dL — ABNORMAL HIGH (ref 0.50–1.35)

## 2011-06-22 LAB — CARBOXYHEMOGLOBIN: Total hemoglobin: 9.2 g/dL — ABNORMAL LOW (ref 13.5–18.0)

## 2011-06-22 MED ORDER — FUROSEMIDE 10 MG/ML IJ SOLN
80.0000 mg | Freq: Two times a day (BID) | INTRAMUSCULAR | Status: DC
  Administered 2011-06-23: 80 mg via INTRAVENOUS
  Filled 2011-06-22 (×3): qty 8

## 2011-06-22 MED ORDER — MILRINONE IN DEXTROSE 200-5 MCG/ML-% IV SOLN
0.2500 ug/kg/min | INTRAVENOUS | Status: DC
  Administered 2011-06-22: 0.25 ug/kg/min via INTRAVENOUS
  Filled 2011-06-22 (×2): qty 100

## 2011-06-22 NOTE — Progress Notes (Signed)
Patient ID: [name], male   DOB: [birth year], 66 y.o.   MRN: [id number]  HPI: [name] is a 66 year old male with chronic systolic CHF secondary to NICM echo EF 10-15% by echo 04/2011 (previously 30-35% 06/2010), HTN, anemia, chronic renal insufficiency Cr 1.31, UGI bleed, duodenal AVMs, rectal bleeding 04/2011, DVTs (2004, 2005, 2007, 2010, 2011) however coumadin on hold due to GI bleed, IVC filter 2005, GERD, BPD, and chronic low back pain .    06/18/11 ABI Unable to compress R femoral vein cannot exclude deep vein thrombosis involving the right femoral vein. Incidental findings are consistent with:enlarged lymph node on the right and enlarged lymph node on the left.- No obvious evidence of deep vein thrombosis involving the left lower extremity.   Admitted 2/1 for diuresis due to massive volume overload. PICC line placed. Now down about 10L. Breathing much better. Edema resolving. No orthopnea/pnd. CVP in 12-13 range. Co-ox borderline at 55%. Cr up slightly.   ROS: All systems negative except as listed in HPI, PMH and Problem List.  Past Medical History  Diagnosis Date  . Venous thromboembolism     DVTs in 2004, 2005, 2007, 8/10, 8/11. IVC filter 2005. Off coumadin 04/2011 secondary to rectal bleeding  . 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 Chronic venous insufficiency   . DM2 (diabetes mellitus, type 2)   . GERD (gastroesophageal reflux disease)   . BPH (benign prostatic hypertrophy)   . Low back pain   . HTN (hypertension)   . HLD (hyperlipidemia)   . NICM (nonischemic cardiomyopathy) 1. 9/11  2. 2/12    1. Echo septal and apical akinesis, dilated LV, EF 45%, RV normal size and systolic function, EF 43% on Myoview  2. Admitted with decompensated CHF, echo EF 30-35% with diffuse hypokinesis but akinesis of mid to apical anteroseptal wall and apex, moderate LVH, mild MR, grade I diastolic dysfunction, SPEP/UP negative and HIV negative. Most recent EF 10-15%  04/2011  . Smoking   . CAD (coronary artery disease) 1. 2007  2. 10/11    1. Left heart cath with 40-50% stenosis in small RCA EF 50%  2. Lexiscan myoview EF 43%, global hypokinesis, possible small area of apical ischemia; LHC no angiographic CAD, no LV-gram done due to CKD   2. No angiographic CAD by cath 02/2010  . CKD (chronic kidney disease) stage 2, GFR 60-89 ml/min as of 02/2011  . Headache   . Diabetes mellitus   . Blood transfusion   . Anemia    History   Social History  . Marital Status: Divorced    Spouse Name: N/A    Number of Children: 1  . Years of Education: N/A   Occupational History  . security guard     Part time   Social History Main Topics  . Smoking status: Current Everyday Smoker -- 1.0 packs/day for 40 years    Types: Cigarettes  . Smokeless tobacco: Never Used  . Alcohol Use: No  . Drug Use: No  . Sexually Active: Not on file   Other Topics Concern  . Not on file   Social History Narrative   Has 1 son.   Family History  Problem Relation Age of Onset  . Coronary artery disease Neg Hx     Premature     Current Facility-Administered Medications  Medication Dose Route Frequency Provider Last Rate Last Dose  . 0.9 %  sodium chloride infusion  250 mL Intravenous PRN [name], NP      . acetaminophen (TYLENOL) tablet 650 mg  650 mg Oral Q4H PRN Amy Clegg, NP      . alum & mag hydroxide-simeth (MAALOX/MYLANTA) 200-200-20 MG/5ML suspension 15-30 mL  15-30 mL Oral Q2H PRN Dolores Patty, MD   30 mL at 06/22/11 1321  . digoxin (LANOXIN) tablet 0.125 mg  0.125 mg Oral Daily Amy Clegg, NP   0.125 mg at 06/22/11 0941  . enoxaparin (LOVENOX) injection 30 mg  30 mg Subcutaneous Q24H Amy Clegg, NP   30 mg at 06/21/11 2239  . furosemide (LASIX) injection 80 mg  80 mg Intravenous Q8H Amy Clegg, NP   80 mg at 06/22/11 1346  . glipiZIDE (GLUCOTROL XL) 24 hr tablet 5 mg  5 mg Oral Q breakfast Amy Clegg, NP   5 mg at 06/21/11 0831  . hydrALAZINE (APRESOLINE)  tablet 12.5 mg  12.5 mg Oral Q8H Amy Clegg, NP   12.5 mg at 06/22/11 1347  . insulin aspart (novoLOG) injection 0-9 Units  0-9 Units Subcutaneous TID WC Amy Clegg, NP      . insulin glargine (LANTUS) injection 20 Units  20 Units Subcutaneous QHS Amy Clegg, NP   20 Units at 06/21/11 2240  . isosorbide mononitrate (IMDUR) 24 hr tablet 30 mg  30 mg Oral Daily Amy Clegg, NP   30 mg at 06/22/11 0940  . loperamide (IMODIUM) 1 MG/5ML solution 2 mg  2 mg Oral PRN Dolores Patty, MD   2 mg at 06/21/11 1338  . metoprolol succinate (TOPROL-XL) 24 hr tablet 50 mg  50 mg Oral Daily Amy Clegg, NP   50 mg at 06/22/11 0941  . ondansetron (ZOFRAN) injection 4 mg  4 mg Intravenous Q6H PRN [name], NP   4 mg at 06/20/11 2150  . pantoprazole (PROTONIX) EC tablet 40 mg  40 mg Oral Q1200 Amy Clegg, NP   40 mg at 06/22/11 1301  . potassium chloride SA (K-DUR,KLOR-CON) CR tablet 40 mEq  40 mEq Oral BID [name], NP   40 mEq at 06/22/11 0940  . rosuvastatin (CRESTOR) tablet 20 mg  20 mg Oral q1800 Amy Clegg, NP   20 mg at 06/21/11 1825  . sodium chloride 0.9 % injection 10-40 mL  10-40 mL Intracatheter Q12H Dolores Patty, MD   10 mL at 06/22/11 1000  . sodium chloride 0.9 % injection 10-40 mL  10-40 mL Intracatheter PRN Dolores Patty, MD      . sodium chloride 0.9 % injection 3 mL  3 mL Intravenous Q12H Amy Clegg, NP   3 mL at 06/22/11 1000  . sodium chloride 0.9 % injection 3 mL  3 mL Intravenous PRN Amy Clegg, NP      . spironolactone (ALDACTONE) tablet 25 mg  25 mg Oral Daily Amy Clegg, NP   25 mg at 06/22/11 0941  . Tamsulosin HCl (FLOMAX) capsule 0.4 mg  0.4 mg Oral QHS Amy Clegg, NP   0.4 mg at 06/21/11 2238     PHYSICAL EXAM: Filed Vitals:   06/22/11 1201  BP:   Pulse:   Temp: 98.5 F (36.9 C)  Resp:     General:  Chronically ill appearing. No resp difficulty HEENT: normal Neck: supple. JVP to ear. Carotids 2+ bilaterally; no bruits. No lymphadenopathy or thryomegaly appreciated. Cor:  PMI laterally displacde. Tachy regular +s3  Lungs: clear Abdomen: soft, nontender, + mildly distended. No hepatosplenomegaly. No bruits  or masses. Good bowel sounds. Extremities: no cyanosis, clubbing, rash, R and L thigh 1+edema Neuro: alert & orientedx3, cranial nerves grossly intact. Moves all 4 extremities w/o difficulty. Affect pleasant.  Results for orders placed during the hospital encounter of 06/20/11 (from the past 48 hour(s))  COMPREHENSIVE METABOLIC PANEL     Status: Abnormal   Collection Time   06/20/11  3:30 PM      Component Value Range Comment   Sodium 136  135 - 145 (mEq/L)    Potassium 4.0  3.5 - 5.1 (mEq/L)    Chloride 105  96 - 112 (mEq/L)    CO2 25  19 - 32 (mEq/L)    Glucose, Bld 61 (*) 70 - 99 (mg/dL)    BUN 15  6 - 23 (mg/dL)    Creatinine, Ser 4.09  0.50 - 1.35 (mg/dL)    Calcium 8.9  8.4 - 10.5 (mg/dL)    Total Protein 7.9  6.0 - 8.3 (g/dL)    Albumin 2.5 (*) 3.5 - 5.2 (g/dL)    AST 21  0 - 37 (U/L)    ALT 17  0 - 53 (U/L)    Alkaline Phosphatase 230 (*) 39 - 117 (U/L)    Total Bilirubin 1.8 (*) 0.3 - 1.2 (mg/dL)    GFR calc non Af Amer 63 (*) >90 (mL/min)    GFR calc Af Amer 73 (*) >90 (mL/min)   MAGNESIUM     Status: Normal   Collection Time   06/20/11  3:30 PM      Component Value Range Comment   Magnesium 2.0  1.5 - 2.5 (mg/dL)   TSH     Status: Normal   Collection Time   06/20/11  3:30 PM      Component Value Range Comment   TSH 1.121  0.350 - 4.500 (uIU/mL)   CBC     Status: Abnormal   Collection Time   06/20/11  3:30 PM      Component Value Range Comment   WBC 4.2  4.0 - 10.5 (K/uL)    RBC 5.06  4.22 - 5.81 (MIL/uL)    Hemoglobin 9.6 (*) 13.0 - 17.0 (g/dL)    HCT 81.1 (*) 91.4 - 52.0 (%)    MCV 68.4 (*) 78.0 - 100.0 (fL)    MCH 19.0 (*) 26.0 - 34.0 (pg)    MCHC 27.7 (*) 30.0 - 36.0 (g/dL)    RDW 78.2 (*) 95.6 - 15.5 (%)    Platelets 219  150 - 400 (K/uL)   HEMOGLOBIN A1C     Status: Abnormal   Collection Time   06/20/11  3:30 PM       Component Value Range Comment   Hemoglobin A1C 6.2 (*) <5.7 (%)    Mean Plasma Glucose 131 (*) <117 (mg/dL)   PRO B NATRIURETIC PEPTIDE     Status: Abnormal   Collection Time   06/20/11  3:30 PM      Component Value Range Comment   Pro B Natriuretic peptide (BNP) 3661.0 (*) 0 - 125 (pg/mL)   GLUCOSE, CAPILLARY     Status: Normal   Collection Time   06/20/11  4:14 PM      Component Value Range Comment   Glucose-Capillary 76  70 - 99 (mg/dL)   GLUCOSE, CAPILLARY     Status: Abnormal   Collection Time   06/20/11  9:51 PM      Component Value Range Comment   Glucose-Capillary 67 (*) 70 -  99 (mg/dL)    Comment 1 Notify RN     GLUCOSE, CAPILLARY     Status: Normal   Collection Time   06/20/11 10:12 PM      Component Value Range Comment   Glucose-Capillary 71  70 - 99 (mg/dL)   GLUCOSE, CAPILLARY     Status: Normal   Collection Time   06/20/11 11:23 PM      Component Value Range Comment   Glucose-Capillary 73  70 - 99 (mg/dL)   CARBOXYHEMOGLOBIN     Status: Abnormal   Collection Time   06/21/11 12:20 AM      Component Value Range Comment   Total hemoglobin 10.0 (*) 13.5 - 18.0 (g/dL)    O2 Saturation 24.4      Carboxyhemoglobin 2.3 (*) 0.5 - 1.5 (%)    Methemoglobin 0.5  0.0 - 1.5 (%)   MRSA PCR SCREENING     Status: Normal   Collection Time   06/21/11 12:39 AM      Component Value Range Comment   MRSA by PCR NEGATIVE  NEGATIVE    CLOSTRIDIUM DIFFICILE BY PCR     Status: Normal   Collection Time   06/21/11  2:55 AM      Component Value Range Comment   C difficile by pcr NEGATIVE  NEGATIVE    BASIC METABOLIC PANEL     Status: Abnormal   Collection Time   06/21/11  4:10 AM      Component Value Range Comment   Sodium 135  135 - 145 (mEq/L)    Potassium 4.1  3.5 - 5.1 (mEq/L)    Chloride 103  96 - 112 (mEq/L)    CO2 25  19 - 32 (mEq/L)    Glucose, Bld 95  70 - 99 (mg/dL)    BUN 15  6 - 23 (mg/dL)    Creatinine, Ser 0.10  0.50 - 1.35 (mg/dL)    Calcium 8.9  8.4 - 10.5 (mg/dL)    GFR calc  non Af Amer 58 (*) >90 (mL/min)    GFR calc Af Amer 67 (*) >90 (mL/min)   CARBOXYHEMOGLOBIN     Status: Abnormal   Collection Time   06/21/11  4:10 AM      Component Value Range Comment   Total hemoglobin 9.7 (*) 13.5 - 18.0 (g/dL)    O2 Saturation 27.2      Carboxyhemoglobin 2.7 (*) 0.5 - 1.5 (%)    Methemoglobin 1.4  0.0 - 1.5 (%)   GLUCOSE, CAPILLARY     Status: Normal   Collection Time   06/21/11  7:49 AM      Component Value Range Comment   Glucose-Capillary 84  70 - 99 (mg/dL)   GLUCOSE, CAPILLARY     Status: Normal   Collection Time   06/21/11 12:03 PM      Component Value Range Comment   Glucose-Capillary 79  70 - 99 (mg/dL)   TSH     Status: Normal   Collection Time   06/21/11  1:30 PM      Component Value Range Comment   TSH 0.922  0.350 - 4.500 (uIU/mL)   T4, FREE     Status: Normal   Collection Time   06/21/11  1:30 PM      Component Value Range Comment   Free T4 1.25  0.80 - 1.80 (ng/dL)   GLUCOSE, CAPILLARY     Status: Normal   Collection Time   06/21/11  4:27  PM      Component Value Range Comment   Glucose-Capillary 75  70 - 99 (mg/dL)   GLUCOSE, CAPILLARY     Status: Abnormal   Collection Time   06/21/11 10:12 PM      Component Value Range Comment   Glucose-Capillary 136 (*) 70 - 99 (mg/dL)   BASIC METABOLIC PANEL     Status: Abnormal   Collection Time   06/22/11  4:20 AM      Component Value Range Comment   Sodium 136  135 - 145 (mEq/L)    Potassium 4.2  3.5 - 5.1 (mEq/L)    Chloride 101  96 - 112 (mEq/L)    CO2 28  19 - 32 (mEq/L)    Glucose, Bld 51 (*) 70 - 99 (mg/dL)    BUN 16  6 - 23 (mg/dL)    Creatinine, Ser 2.95 (*) 0.50 - 1.35 (mg/dL)    Calcium 9.1  8.4 - 10.5 (mg/dL)    GFR calc non Af Amer 47 (*) >90 (mL/min)    GFR calc Af Amer 55 (*) >90 (mL/min)   CARBOXYHEMOGLOBIN     Status: Abnormal   Collection Time   06/22/11  6:10 AM      Component Value Range Comment   Total hemoglobin 9.2 (*) 13.5 - 18.0 (g/dL)    O2 Saturation 62.1       Carboxyhemoglobin 2.2 (*) 0.5 - 1.5 (%)    Methemoglobin 0.5  0.0 - 1.5 (%)   GLUCOSE, CAPILLARY     Status: Abnormal   Collection Time   06/22/11  7:47 AM      Component Value Range Comment   Glucose-Capillary 52 (*) 70 - 99 (mg/dL)    Comment 1 Notify RN     GLUCOSE, CAPILLARY     Status: Normal   Collection Time   06/22/11  8:35 AM      Component Value Range Comment   Glucose-Capillary 78  70 - 99 (mg/dL)   GLUCOSE, CAPILLARY     Status: Abnormal   Collection Time   06/22/11 12:00 PM      Component Value Range Comment   Glucose-Capillary 105 (*) 70 - 99 (mg/dL)    Comment 1 Notify RN       ASSESSMENT:  1. A/C systolic HF with massive volume overload 2. NICM EF 15% 3. Multiple PEs/DVTs s/p IVC filter          --refuses coumadin due to h/o GIB (AVMs) 4. DM2 5. Diarrhea -resolved 6. Acute renal insufficiency  PLAN/DISCUSSION:  Diuresing very well on IV lasix but still with some volume overload. HR remains elevated. Renal function slightly worse. Will decrease lasix a bit and add milrinone for hemodynamic support.  Refuses anti-coagulation for DVT/PE. Will need repeat u/s LE once diuresed.    Pheobe Sandiford,MD 2:56 PM

## 2011-06-23 ENCOUNTER — Inpatient Hospital Stay (HOSPITAL_COMMUNITY): Payer: PRIVATE HEALTH INSURANCE

## 2011-06-23 DIAGNOSIS — R000 Tachycardia, unspecified: Secondary | ICD-10-CM

## 2011-06-23 DIAGNOSIS — I871 Compression of vein: Secondary | ICD-10-CM

## 2011-06-23 DIAGNOSIS — R609 Edema, unspecified: Secondary | ICD-10-CM

## 2011-06-23 LAB — BASIC METABOLIC PANEL
BUN: 16 mg/dL (ref 6–23)
Calcium: 8.7 mg/dL (ref 8.4–10.5)
Creatinine, Ser: 1.51 mg/dL — ABNORMAL HIGH (ref 0.50–1.35)
GFR calc Af Amer: 55 mL/min — ABNORMAL LOW (ref >90)
GFR calc non Af Amer: 47 mL/min — ABNORMAL LOW (ref >90)
Glucose, Bld: 126 mg/dL — ABNORMAL HIGH (ref 70–99)
Potassium: 4 mEq/L (ref 3.5–5.1)

## 2011-06-23 LAB — GIARDIA/CRYPTOSPORIDIUM SCREEN(EIA): Giardia Screen - EIA: NEGATIVE

## 2011-06-23 LAB — GLUCOSE, CAPILLARY
Glucose-Capillary: 151 mg/dL — ABNORMAL HIGH (ref 70–99)
Glucose-Capillary: 121 mg/dL — ABNORMAL HIGH (ref 70–99)

## 2011-06-23 MED ORDER — MILRINONE LACTATE 1 MG/ML IV SOLN
0.2500 ug/kg/min | INTRAVENOUS | Status: DC
  Administered 2011-06-23: 0.25 ug/kg/min via INTRAVENOUS
  Filled 2011-06-23: qty 20

## 2011-06-23 MED ORDER — TECHNETIUM TO 99M ALBUMIN AGGREGATED
6.0000 | Freq: Once | INTRAVENOUS | Status: AC | PRN
  Administered 2011-06-23: 6 via INTRAVENOUS

## 2011-06-23 MED ORDER — XENON XE 133 GAS
10.0000 | GAS_FOR_INHALATION | Freq: Once | RESPIRATORY_TRACT | Status: AC | PRN
  Administered 2011-06-23: 10 via RESPIRATORY_TRACT

## 2011-06-23 MED ORDER — MILRINONE IN DEXTROSE 200-5 MCG/ML-% IV SOLN: 0.2500 ug/kg/min | INTRAVENOUS | Status: DC

## 2011-06-23 MED ORDER — MILRINONE IN DEXTROSE 200-5 MCG/ML-% IV SOLN
0.1250 ug/kg/min | INTRAVENOUS | Status: DC
  Administered 2011-06-23: 0.125 ug/kg/min via INTRAVENOUS
  Filled 2011-06-23: qty 100

## 2011-06-23 MED ORDER — DEXTROSE 5 % IV SOLN
0.2500 ug/kg/min | INTRAVENOUS | Status: DC
  Filled 2011-06-23: qty 20

## 2011-06-23 MED ORDER — ENOXAPARIN SODIUM 40 MG/0.4ML ~~LOC~~ SOLN
40.0000 mg | SUBCUTANEOUS | Status: DC
  Administered 2011-06-23 – 2011-06-24 (×2): 40 mg via SUBCUTANEOUS
  Filled 2011-06-23 (×3): qty 0.4

## 2011-06-23 MED ORDER — FUROSEMIDE 80 MG PO TABS
80.0000 mg | ORAL_TABLET | Freq: Two times a day (BID) | ORAL | Status: DC
  Administered 2011-06-24 (×2): 80 mg via ORAL
  Filled 2011-06-23 (×5): qty 1

## 2011-06-23 NOTE — Clinical Documentation Improvement (Signed)
RENAL FAILURE DOCUMENTATION CLARIFICATION QUERY  THIS DOCUMENT IS NOT A PERMANENT PART OF THE MEDICAL RECORD  TO RESPOND TO THE THIS QUERY, FOLLOW THE INSTRUCTIONS BELOW:  1. If needed, update documentation for the patient's encounter via the notes activity.  2. Access this query again and click edit on the In [employer].  3. After updating, or not, click F2 to complete all highlighted (required) fields concerning your review. Select "additional documentation in the medical record" OR "no additional documentation provided".  4. Click Sign note button.  5. The deficiency will fall out of your In Basket *Please let us know if you are not able to complete this workflow by phone or e-mail (listed below).  Please update your documentation within the medical record to reflect your response to this query.                                                                                    06/23/11  Dear Dr.Bensimhon  In a better effort to capture your patient's severity of illness, reflect appropriate length of stay and utilization of resources, a review of the patient medical record has revealed the following indicators. Based on your clinical judgment, please clarify and document in a progress note and/or discharge summary the clinical condition associated with the following supporting information:In responding to this query please exercise your independent judgment.  The fact that a query is asked, does not imply that any particular answer is desired or expected.  Possible Clinical Conditions?  _______Acute Renal Failure/Acute Kidney Injury  _______Acute on Chronic Renal Failure  _______Other Condition_____________  _______Cannot Clinically Determine     Supporting Information:  Risk Factors: pmh: renal insufficiency; CKD (chronic kidney disease) stage 2, GFR 60-89 ml/min as of 02/2011  Signs and Symptoms:"Cr up slightly";   Diagnostics:  Lab:  Baseline Cr Level : 1.19 (2/1)  1.28 (2/2) 1.51 (2/3) 1.51 (2.4)     Current BUN Level: 15 (2/1) 15 (2/2) 16 (2/3) 16 (2/4)  Electrolytes: Na+:133 GFR: 55  Treatments: "decrease lasix a bit and add milrinone for hemodynamic support"  You may use possible, probable, or suspect with inpatient documentation. possible, probable, suspected diagnoses MUST be documented at the time of discharge  Reviewed: additional documentation in the medical record [facility]   Thank You,  Amada Kingfisher RN BSN CCM Clinical Documentation Specialist: [phone] [email or web address] [email or web address]  Health Information Management [place]

## 2011-06-23 NOTE — Progress Notes (Addendum)
Subjective:  [name] is a 66 year old male with chronic systolic CHF secondary to NICM echo EF 10-15% by echo 04/2011 (previously 30-35% 06/2010), HTN, anemia, chronic renal insufficiency Cr 1.31, UGI bleed, duodenal AVMs, rectal bleeding 04/2011, DVTs (2004, 2005, 2007, 2010, 2011) however coumadin on hold due to GI bleed, IVC filter 2005, GERD, BPD, and chronic low back pain .  06/18/11 ABI Unable to compress R femoral vein cannot exclude deep vein thrombosis involving the right femoral vein. Incidental findings are consistent with:enlarged lymph node on the right and enlarged lymph node on the left.- No obvious evidence of deep vein thrombosis involving the left lower extremity.   Admitted 2/1 for diuresis due to massive volume overload. PICC line placed  Weight down 32 pounds since admit. CO-OX 51.1 CVP 4. He continue on Milrinone 0.25 mcg. Lasix decreased 80 mg BID.      Intake/Output Summary (Last 24 hours) at 06/23/11 0836 Last data filed at 06/23/11 0700  Gross per 24 hour  Intake   1092 ml  Output   4925 ml  Net  -3833 ml    Current meds:    . digoxin  0.125 mg Oral Daily  . enoxaparin  30 mg Subcutaneous Q24H  . furosemide  80 mg Intravenous BID  . glipiZIDE  5 mg Oral Q breakfast  . hydrALAZINE  12.5 mg Oral Q8H  . insulin aspart  0-9 Units Subcutaneous TID WC  . insulin glargine  20 Units Subcutaneous QHS  . isosorbide mononitrate  30 mg Oral Daily  . metoprolol succinate  50 mg Oral Daily  . pantoprazole  40 mg Oral Q1200  . potassium chloride  40 mEq Oral BID  . rosuvastatin  20 mg Oral q1800  . sodium chloride  10-40 mL Intracatheter Q12H  . sodium chloride  3 mL Intravenous Q12H  . spironolactone  25 mg Oral Daily  . Tamsulosin HCl  0.4 mg Oral QHS  . DISCONTD: furosemide  80 mg Intravenous Q8H   Infusions:    . milrinone    . DISCONTD: milronone [facility]) infusion 0.25 mcg/kg/min (06/23/11 4132)  . DISCONTD: milronone (PRIMACOR) infusion    .  DISCONTD: milrinone 0.25 mcg/kg/min (06/22/11 1603)  . DISCONTD: milrinone       Objective:  Blood pressure 119/72, pulse 118, temperature 98.5 F (36.9 C), temperature source Oral, resp. rate 16, height 6' (1.829 m), weight 74 kg (163 lb 2.3 oz), SpO2 95.00%. Weight change: -3.338 kg (-7 lb 5.8 oz)  Physical Exam: CVP 4 General:  Well appearing. No resp difficulty HEENT: normal Neck: supple. JVP 5 . Carotids 2+ bilat; no bruits. No lymphadenopathy or thryomegaly appreciated. Cor: PMI nondisplaced. Tachycardic Regular rate & rhythm. No rubs, S3 gallops or murmurs. Lungs: clear Abdomen: soft, nontender, nondistended. No hepatosplenomegaly. No bruits or masses. Good bowel sounds. Extremities: no cyanosis, clubbing, rash, edema Neuro: alert & orientedx3, cranial nerves grossly intact. moves all 4 extremities w/o difficulty. Affect pleasant  Telemetry: Sinus Tach 120-130  Lab Results: Basic Metabolic Panel:  Lab 06/23/11 4401 06/22/11 0420 06/21/11 0410 06/20/11 1530  NA 133* 136 135 136  K 4.0 4.2 -- --  CL 98 101 103 105  CO2 32 28 25 25   GLUCOSE 126* 51* 95 61*  BUN 16 16 15 15   CREATININE 1.51* 1.51* 1.28 1.19  CALCIUM 8.7 9.1 8.9 8.9  MG -- -- -- 2.0  PHOS -- -- -- --   Liver Function Tests:  Lab 06/20/11  1530  AST 21  ALT 17  ALKPHOS 230*  BILITOT 1.8*  PROT 7.9  ALBUMIN 2.5*   No results found for this basename: LIPASE:5,AMYLASE:5 in the last 168 hours No results found for this basename: AMMONIA:5 in the last 168 hours CBC:  Lab 06/20/11 1530  WBC 4.2  NEUTROABS --  HGB 9.6*  HCT 34.6*  MCV 68.4*  PLT 219   Cardiac Enzymes: No results found for this basename: CKTOTAL:5,CKMB:5,CKMBINDEX:5,TROPONINI:5 in the last 168 hours BNP: No components found with this basename: POCBNP:5 CBG:  Lab 06/23/11 0820 06/22/11 2121 06/22/11 1712 06/22/11 1200 06/22/11 0835  GLUCAP 153* 161* 142* 105* 78   Microbiology: Lab Results  Component Value Date   CULT  Culture reincubated for better growth 06/21/2011   CULT NO GROWTH 5 DAYS 01/12/2009   CULT NO GROWTH 5 DAYS 01/12/2009   CULT NO GROWTH 10/12/2007   CULT NO GROWTH 5 DAYS 10/12/2007    Lab 06/21/11 1344  CULT Culture reincubated for better growth  SDES STOOL    Imaging: No results found.   ASSESSMENT:  1. A/C systolic HF with massive volume overload  2. NICM EF 15%  3. Multiple PEs/DVTs s/p IVC filter  --refuses coumadin due to h/o GIB (AVMs)  4. DM2  5. Diarrhea -resolved  6. Acute renal failure   PLAN/DISCUSSION: Volume status significantly decreased. CVP now 4. Will decrease Milrinone 012.5 mcg/kg/min.Stop IV lasix . Start home regimen Lasix in am. Will repeat ultrasound of lower extremity today. Refuses anti-coagulation for DVT/PE.    LOS: 3 days    CLEGG,AMY, NP 06/23/2011, 8:36 AM  Patient seen and examined with [name], NP. We discussed all aspects of the encounter. I agree with the assessment and plan as stated above. Volume status much improved. Agree with switching IV lasix to po. The trick will be to find a home diuretic regiment that works for him. He remains quite tachycardic. Will need VQ and f/u LE u/s. May likely require RHC prior to d/c.   Shailah Gibbins,MD 8:56 AM

## 2011-06-23 NOTE — Progress Notes (Signed)
VASCULAR LAB PRELIMINARY  PRELIMINARY  PRELIMINARY  PRELIMINARY  Bilateral lower extremity venous duplex  completed.    Preliminary report:  Bilateral:  No evidence of acute DVT, superficial thrombosis, or Baker's Cyst. 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  Veins compressed easily bilaterally.    Terance Hart, RVT 06/23/2011, 4:35 PM

## 2011-06-24 LAB — CARBOXYHEMOGLOBIN
Carboxyhemoglobin: 2 % — ABNORMAL HIGH (ref 0.5–1.5)
Total hemoglobin: 9.5 g/dL — ABNORMAL LOW (ref 13.5–18.0)

## 2011-06-24 LAB — GLUCOSE, CAPILLARY: Glucose-Capillary: 119 mg/dL — ABNORMAL HIGH (ref 70–99)

## 2011-06-24 MED ORDER — SODIUM CHLORIDE 0.9 % IJ SOLN: 3.0000 mL | INTRAMUSCULAR | Status: DC | PRN

## 2011-06-24 MED ORDER — SODIUM CHLORIDE 0.9 % IJ SOLN
3.0000 mL | Freq: Two times a day (BID) | INTRAMUSCULAR | Status: DC
  Administered 2011-06-24: 3 mL via INTRAVENOUS

## 2011-06-24 MED ORDER — HYDRALAZINE HCL 25 MG PO TABS
25.0000 mg | ORAL_TABLET | Freq: Three times a day (TID) | ORAL | Status: DC
  Administered 2011-06-24 – 2011-06-25 (×4): 25 mg via ORAL
  Filled 2011-06-24 (×6): qty 1

## 2011-06-24 MED ORDER — SODIUM CHLORIDE 0.9 % IV SOLN: 250.0000 mL | INTRAVENOUS | Status: DC | PRN

## 2011-06-24 MED ORDER — ASPIRIN 81 MG PO CHEW
324.0000 mg | CHEWABLE_TABLET | ORAL | Status: AC
  Administered 2011-06-25: 324 mg via ORAL
  Filled 2011-06-24: qty 4

## 2011-06-24 NOTE — Discharge Summary (Signed)
Patient ID: [name] MRN: [id number] DOB/AGE: [birth year] 66 y.o.  Admit date: 06/20/2011 Discharge date: 06/25/2011  Primary Discharge Diagnosis 1. A/C systolic HF with massive volume overload  2. NICM EF 15%  3. Multiple PEs/DVTs s/p IVC filter  --refuses coumadin due to h/o GIB (AVMs)  -- VQ and LE dopplers negative this admit  4. DM2  5. Diarrhea 6. Acute/chronic renal failure  7. Mild PAH   Significant Diagnostic Studies: RHC 06/25/11 RA = 4  RV = 32/6 (7)  PA = 45/15 (24)  PCW = 7  Fick cardiac output/index = 5.2/2.7  Thermo = 4.0/2.1  PVR = 3.4 [place]  FA sat = 94%  PA sat = 56%, 57%  Noninvasive BP : 125/92 (72)  Calculated SVR = 1040    Hospital Course:  [name] is a 66 year old male with chronic systolic CHF secondary to NICM echo EF 10-15% by echo 04/2011 (previously 30-35% 06/2010), HTN, anemia, chronic renal insufficiency Cr 1.31, UGI bleed, duodenal AVMs, rectal bleeding 04/2011, DVTs (2004, 2005, 2007, 2010, 2011) however coumadin on hold due to GI bleed, IVC filter 2005, GERD, BPD, and chronic low back pain . He refuses chronic coumadin due to past GI bleed.   06/18/11 ABI Unable to compress R femoral vein cannot exclude deep vein thrombosis involving the right femoral vein. Incidental findings are consistent with:enlarged lymph node on the right and enlarged lymph node on the left.- No obvious evidence of deep vein thrombosis involving the left lower extremity.   Admitted from HF clinic 2/1 for diuresis due to massive volume overload. PICC placed on admission for Milrinone and CVP monitoring. Admission weight 195 pounds. Despite history of DVTs he refused coumadin due to history of GI bleed. He was aggressively diuresed with IV Lasix with good result. When diuresis slowed down milrinone was added for 2 days. CO-OX on Milrinone was 61.6. Weight continue to trend down throughout admission. Repeat lower extremity doppler completed after he was diuresed which was  negative for DVT. VQ scan obtained due to history of  DVTs and persistent  tachycardia. VQ revealed low probability for PE.   Milrinone discontinued as volume status improved CVP=4. Co-OX off Milrinone  63.5.   Remained tachycardic with RH in low 100s. RHC completed 2-6 to assess hemodynamics results as noted above. Ambulating in room independently. Social work followed closely to provide education and medication compliance.  Discharge weight 159 pounds.  PICC line removed the day of discharge. At the time of discharge he is medically stable.  He will [facility] to be [facility] for congestive heart failure care by the RN. He will be followed closely in the heart failure clinic.    Discharge Info: Blood pressure 114/76, pulse 109, temperature 97.9 F (36.6 C), temperature source Oral, resp. rate 14, height 6' (1.829 m), weight 72.2 kg (159 lb 2.8 oz), SpO2 97.00%.   Weight change:    Physical Exam:   General: Well appearing. No resp difficulty  HEENT: normal  Neck: supple. JVP 5 . Carotids 2+ bilat; no bruits. No lymphadenopathy or thryomegaly appreciated.  Cor: PMI nondisplaced. Tachycardic Regular rate & rhythm. No rubs, S3 gallops or murmurs.  Lungs: clear  Abdomen: soft, nontender, nondistended. No hepatosplenomegaly. No bruits or masses. Good bowel sounds.  Extremities: no cyanosis, clubbing, rash. No  edema  Neuro: alert & orientedx3, cranial nerves grossly intact. moves all 4 extremities w/o difficulty. Affect pleasant   Telemetry: Sinus Tach 115  Results for orders placed during the hospital encounter of 06/20/11 (from the past 24 hour(s))  GLUCOSE, CAPILLARY     Status: Abnormal   Collection Time   06/24/11  4:44 PM      Component Value Range   Glucose-Capillary 119 (*) 70 - 99 (mg/dL)  GLUCOSE, CAPILLARY     Status: Abnormal   Collection Time   06/24/11  9:57 PM      Component Value Range   Glucose-Capillary 142 (*) 70 - 99 (mg/dL)   Comment 1 Notify RN     Comment 2  Documented in Chart    BASIC METABOLIC PANEL     Status: Abnormal   Collection Time   06/25/11  4:55 AM      Component Value Range   Sodium 137  135 - 145 (mEq/L)   Potassium 5.1  3.5 - 5.1 (mEq/L)   Chloride 101  96 - 112 (mEq/L)   CO2 30  19 - 32 (mEq/L)   Glucose, Bld 105 (*) 70 - 99 (mg/dL)   BUN 16  6 - 23 (mg/dL)   Creatinine, Ser 6.29 (*) 0.50 - 1.35 (mg/dL)   Calcium 9.1  8.4 - 52.8 (mg/dL)   GFR calc non Af Amer 52 (*) >90 (mL/min)   GFR calc Af Amer 60 (*) >90 (mL/min)  PROTIME-INR     Status: Abnormal   Collection Time   06/25/11  4:55 AM      Component Value Range   Prothrombin Time 16.3 (*) 11.6 - 15.2 (seconds)   INR 1.29  0.00 - 1.49   CARBOXYHEMOGLOBIN     Status: Abnormal   Collection Time   06/25/11  5:15 AM      Component Value Range   Total hemoglobin 9.7 (*) 13.5 - 18.0 (g/dL)   O2 Saturation 41.3     Carboxyhemoglobin 1.7 (*) 0.5 - 1.5 (%)   Methemoglobin 0.6  0.0 - 1.5 (%)  GLUCOSE, CAPILLARY     Status: Abnormal   Collection Time   06/25/11  8:09 AM      Component Value Range   Glucose-Capillary 65 (*) 70 - 99 (mg/dL)  POCT I-STAT 3, BLOOD GAS (G3P V)     Status: Abnormal   Collection Time   06/25/11  9:18 AM      Component Value Range   pH, Ven 7.329 (*) 7.250 - 7.300    pCO2, Ven 49.9  45.0 - 50.0 (mmHg)   pO2, Ven 32.0  30.0 - 45.0 (mmHg)   Bicarbonate 26.2 (*) 20.0 - 24.0 (mEq/L)   TCO2 28  0 - 100 (mmol/L)   O2 Saturation 56.0     Sample type VENOUS     Comment NOTIFIED PHYSICIAN    GLUCOSE, CAPILLARY     Status: Abnormal   Collection Time   06/25/11  9:41 AM      Component Value Range   Glucose-Capillary 65 (*) 70 - 99 (mg/dL)  GLUCOSE, CAPILLARY     Status: Abnormal   Collection Time   06/25/11 10:00 AM      Component Value Range   Glucose-Capillary 69 (*) 70 - 99 (mg/dL)  GLUCOSE, CAPILLARY     Status: Normal   Collection Time   06/25/11 10:13 AM      Component Value Range   Glucose-Capillary 73  70 - 99 (mg/dL)   Comment 1 Documented in  Chart     Comment 2 Notify RN    GLUCOSE, CAPILLARY  Status: Abnormal   Collection Time   06/25/11 12:28 PM      Component Value Range   Glucose-Capillary 205 (*) 70 - 99 (mg/dL)   Nm Pulmonary Per & Vent  06/23/2011 *RADIOLOGY REPORT* Clinical Data: *RADIOLOGY REPORT* Clinical Data: Shortness of breath and chest pain. NUCLEAR MEDICINE VENTILATION - PERFUSION LUNG SCAN Technique: Wash-in, equilibrium, and wash-out phase ventilation images were obtained using Xe-133 gas. Perfusion images were obtained in multiple projections after intravenous injection of Tc- 79m MAA. Radiopharmaceuticals: 10 mCi Xe-133 gas and six mCi Tc-64m MAA. Comparison: Plain film of the chest 06/23/2011 at 1326 hours. Findings: A nonsegmental ill-defined defect is seen in the posterior aspect of the right lower lobe on the right lateral view of the perfusion sequences only. No segmental or subsegmental defect is identified. Ventilation imaging demonstrates some mild air trapping in the left mid and upper lung zones. IMPRESSION: 1. Low probability for pulmonary embolus. 2. Mild air trapping left mid and lower lung zones. Question mucus plugging. Original Report Authenticated By: Bernadene Bell. Maricela Curet, M.D.     Discharge Medications: Medication List  As of 06/25/2011  2:12 PM   STOP taking these medications         metolazone 5 MG tablet      potassium chloride 10 MEQ tablet         TAKE these medications         atorvastatin 20 MG tablet   Commonly known as: LIPITOR   Take 20 mg by mouth daily.      digoxin 0.125 MG tablet   Commonly known as: LANOXIN   Take 1 tablet (125 mcg total) by mouth daily.      furosemide 40 MG tablet   Commonly known as: LASIX   Take 1 tablet (40 mg total) by mouth 2 (two) times daily.      glipiZIDE 5 MG 24 hr tablet   Commonly known as: GLUCOTROL XL   Take 5 mg by mouth daily.      hydrALAZINE 25 MG tablet   Commonly known as: APRESOLINE   Take 1 tablet (25 mg total) by mouth 3  (three) times daily.      insulin glargine 100 UNIT/ML injection   Commonly known as: LANTUS   Inject 20 Units into the skin at bedtime.      isosorbide mononitrate 30 MG 24 hr tablet   Commonly known as: IMDUR   Take 1 tablet (30 mg total) by mouth daily.      lansoprazole 30 MG capsule   Commonly known as: PREVACID   Take 30 mg by mouth daily.      metoprolol succinate 50 MG 24 hr tablet   Commonly known as: TOPROL-XL   Take 1 tablet (50 mg total) by mouth daily. Take with or immediately following a meal.      naphazoline 0.012 % ophthalmic solution   Commonly known as: CLEAR EYES   Place 2 drops into both eyes at bedtime as needed. For dry/itchy eyes      spironolactone 25 MG tablet   Commonly known as: ALDACTONE   Take 1 tablet (25 mg total) by mouth daily.      Tamsulosin HCl 0.4 MG Caps   Commonly known as: FLOMAX   Take 1 capsule (0.4 mg total) by mouth at bedtime.            Follow-up Plans & Instructions: Discharge Orders    Future Appointments: Provider: Department: Dept Phone: Center:  07/03/2011 12:00 PM [facility] [phone] None     Follow-up   Return in about 1 month (around 07/18/2011).     Follow-up Information    Follow up with Arvilla Meres, MD on 07/03/2011. (12:00 Garage Code 0600)    Contact information:   [facility] [address] [place] [address] [phone]           BRING ALL MEDICATIONS WITH YOU TO FOLLOW UP APPOINTMENTS  Time spent with patient to include physician time:40 minutes Signed:  CLEGG,AMY, NP 06/25/2011, 1:43 PM  Patient seen and examined with [name], NP. We discussed all aspects of the encounter. I agree with the assessment and plan as stated above. Patient did well with diuresis. Extensive HF education done throughout the hospitalization. Will follow him closely in the HF clinic.  Sherlonda Flater,MD 2:09 PM

## 2011-06-24 NOTE — Progress Notes (Signed)
Subjective:  [name] is a 66 year old male with chronic systolic CHF secondary to NICM echo EF 10-15% by echo 04/2011 (previously 30-35% 06/2010), HTN, anemia, chronic renal insufficiency Cr 1.31, UGI bleed, duodenal AVMs, rectal bleeding 04/2011, DVTs (2004, 2005, 2007, 2010, 2011) however coumadin on hold due to GI bleed, IVC filter 2005, GERD, BPD, and chronic low back pain .  06/18/11 ABI Unable to compress R femoral vein cannot exclude deep vein thrombosis involving the right femoral vein. Incidental findings are consistent with:enlarged lymph node on the right and enlarged lymph node on the left.- No obvious evidence of deep vein thrombosis involving the left lower extremity.   Admitted 2/1 for diuresis due to massive volume overload. PICC line placed  Weight down 34 pounds since admit. Continues on milrinone. Labs still pending. Breathing much better.No orthopnea or PND. Eating a lot of saltines.  CVP = 4  LE Dopplers no clot bilaterally. VQ low prob. Remains tachycardic but improved.    Intake/Output Summary (Last 24 hours) at 06/24/11 0653 Last data filed at 06/24/11 0600  Gross per 24 hour  Intake 2529.6 ml  Output   4801 ml  Net -2271.4 ml    Current meds:    . digoxin  0.125 mg Oral Daily  . enoxaparin (LOVENOX) injection  40 mg Subcutaneous Q24H  . furosemide  80 mg Oral BID  . glipiZIDE  5 mg Oral Q breakfast  . hydrALAZINE  12.5 mg Oral Q8H  . insulin aspart  0-9 Units Subcutaneous TID WC  . insulin glargine  20 Units Subcutaneous QHS  . isosorbide mononitrate  30 mg Oral Daily  . metoprolol succinate  50 mg Oral Daily  . pantoprazole  40 mg Oral Q1200  . potassium chloride  40 mEq Oral BID  . rosuvastatin  20 mg Oral q1800  . sodium chloride  10-40 mL Intracatheter Q12H  . sodium chloride  3 mL Intravenous Q12H  . spironolactone  25 mg Oral Daily  . Tamsulosin HCl  0.4 mg Oral QHS  . DISCONTD: enoxaparin  30 mg Subcutaneous Q24H  . DISCONTD: furosemide  80  mg Intravenous BID   Infusions:    . milrinone 0.125 mcg/kg/min (06/23/11 0900)     Objective:  Blood pressure 123/82, pulse 115, temperature 98.2 F (36.8 C), temperature source Oral, resp. rate 16, height 6' (1.829 m), weight 73.2 kg (161 lb 6 oz), SpO2 92.00%. Weight change: -0.8 kg (-1 lb 12.2 oz)  Physical Exam: CVP 4 General:  Well appearing. No resp difficulty HEENT: normal Neck: supple. JVP 5 . Carotids 2+ bilat; no bruits. No lymphadenopathy or thryomegaly appreciated. Cor: PMI nondisplaced. Tachycardic Regular rate & rhythm. No rubs, S3 gallops or murmurs. Lungs: clear Abdomen: soft, nontender, nondistended. No hepatosplenomegaly. No bruits or masses. Good bowel sounds. Extremities: no cyanosis, clubbing, rash. Tr edema Neuro: alert & orientedx3, cranial nerves grossly intact. moves all 4 extremities w/o difficulty. Affect pleasant  Telemetry: Sinus Tach 120-130  Lab Results: Basic Metabolic Panel:  Lab 06/23/11 1610 06/22/11 0420 06/21/11 0410 06/20/11 1530  NA 133* 136 135 136  K 4.0 4.2 -- --  CL 98 101 103 105  CO2 32 28 25 25   GLUCOSE 126* 51* 95 61*  BUN 16 16 15 15   CREATININE 1.51* 1.51* 1.28 1.19  CALCIUM 8.7 9.1 8.9 8.9  MG -- -- -- 2.0  PHOS -- -- -- --   Liver Function Tests:  Lab 06/20/11 1530  AST 21  ALT 17  ALKPHOS 230*  BILITOT 1.8*  PROT 7.9  ALBUMIN 2.5*   No results found for this basename: LIPASE:5,AMYLASE:5 in the last 168 hours No results found for this basename: AMMONIA:5 in the last 168 hours CBC:  Lab 06/20/11 1530  WBC 4.2  NEUTROABS --  HGB 9.6*  HCT 34.6*  MCV 68.4*  PLT 219   Cardiac Enzymes: No results found for this basename: CKTOTAL:5,CKMB:5,CKMBINDEX:5,TROPONINI:5 in the last 168 hours BNP: No components found with this basename: POCBNP:5 CBG:  Lab 06/23/11 2122 06/23/11 1633 06/23/11 1223 06/23/11 0820 06/22/11 2121  GLUCAP 160* 121* 151* 153* 161*   Microbiology: Lab Results  Component Value Date     CULT NO SUSPICIOUS COLONIES, CONTINUING TO HOLD 06/21/2011   CULT NO GROWTH 5 DAYS 01/12/2009   CULT NO GROWTH 5 DAYS 01/12/2009   CULT NO GROWTH 10/12/2007   CULT NO GROWTH 5 DAYS 10/12/2007    Lab 06/21/11 1344  CULT NO SUSPICIOUS COLONIES, CONTINUING TO HOLD  SDES STOOL    Imaging: Dg Chest 2 View  06/23/2011  *RADIOLOGY REPORT*  Clinical Data: CHF  CHEST - 2 VIEW  Comparison: 06/21/2011  Findings: Heart is top normal in size.  No frank interstitial edema.  No pneumothorax.  Small left pleural effusion.  Associated left lower lobe opacity, likely atelectasis.  Stable right PICC.  Mild degenerative changes of the visualized thoracolumbar spine.  Cholecystectomy clips.  IMPRESSION: No frank interstitial edema.  Small left pleural effusion with associated left lower lobe opacity, likely atelectasis.  Original Report Authenticated By: Charline Bills, M.D.   Nm Pulmonary Per & Vent  06/23/2011  *RADIOLOGY REPORT*  Clinical Data: *RADIOLOGY REPORT*  Clinical Data:  Shortness of breath and chest pain.  NUCLEAR MEDICINE VENTILATION - PERFUSION LUNG SCAN  Technique:  Wash-in, equilibrium, and wash-out phase ventilation images were obtained using Xe-133 gas.  Perfusion images were obtained in multiple projections after intravenous injection of Tc- 47m MAA.  Radiopharmaceuticals:  10 mCi Xe-133 gas and six mCi Tc-27m MAA.  Comparison:  Plain film of the chest 06/23/2011 at 1326 hours.  Findings: A nonsegmental ill-defined defect is seen in the posterior aspect of the right lower lobe on the right lateral view of the perfusion sequences only.  No segmental or subsegmental defect is identified.  Ventilation imaging demonstrates some mild air trapping in the left mid and upper lung zones.  IMPRESSION:  1.  Low probability for pulmonary embolus. 2.  Mild air trapping left mid and lower lung zones.  Question mucus plugging.  Original Report Authenticated By: Bernadene Bell. D'ALESSIO, M.D.     ASSESSMENT:  1. A/C  systolic HF with massive volume overload  2. NICM EF 15%  3. Multiple PEs/DVTs s/p IVC filter  --refuses coumadin due to h/o GIB (AVMs)  -- VQ and LE dopplers negative this admit 4. DM2  5. Diarrhea -resolved  6. Acute renal failure   PLAN/DISCUSSION:  Doing well. Will stop milrinone. Titrate hydralazine to 25 tid. Ambulate. Plan RHC tomorrow to assess hemodynamics and optimize home regimen (will do thru IJ). VQ and LE Dopplers are negative. Possibly home tomorrow. Reinforced need for salt restriction. Check labs this am.   Reuel Boom Bensimhon,MD 7:08 AM

## 2011-06-24 NOTE — Progress Notes (Signed)
06/24/11   1500  UR Completed. Tera Mater, RN, BSN

## 2011-06-24 NOTE — Progress Notes (Signed)
CSW met with patient at bedside to provide support and also discuss CHF management and home health needs. Pt. Reported that he has several books on CHF management and knows all of the management guidelines. CSW stressed the importance of following all guidelines(weighing daily, eating a low sodium diet and adhering to all medications prescribed). Patient reported that he weighs daily, eats low NA and also adheres to all medications prescribed. Pt currently is set up with the [facility] program which will be helpful in managing his chronic CHF. Patient  has medications delivered to him so he does not have to go to [place] to get them. Patient also currently has home health with [facility] and would like to continue receiving services from a RN. CSW contacted [facility] to make them aware that he would like to continue services. Clinical Social Worker will sign off for now as social work intervention is no longer needed. Please consult [place] again if new need arises.

## 2011-06-25 ENCOUNTER — Encounter (HOSPITAL_COMMUNITY): Payer: Self-pay

## 2011-06-25 ENCOUNTER — Encounter (HOSPITAL_COMMUNITY)
Admission: RE | Disposition: A | Discharge status: Home / Self Care | Payer: Self-pay | Source: Ambulatory Visit | Attending: Internal Medicine

## 2011-06-25 DIAGNOSIS — I509 Heart failure, unspecified: Secondary | ICD-10-CM

## 2011-06-25 HISTORY — PX: RIGHT HEART CATHETERIZATION: SHX5447

## 2011-06-25 LAB — POCT I-STAT 3, VENOUS BLOOD GAS (G3P V)
Acid-base deficit: 2 mmol/L (ref 0.0–2.0)
Bicarbonate: 26.2 meq/L — ABNORMAL HIGH (ref 20.0–24.0)
O2 Saturation: 56 %
O2 Saturation: 57 %
TCO2: 28 mmol/L (ref 0–100)
TCO2: 26 mmol/L (ref 0–100)
pCO2, Ven: 49.9 mmHg (ref 45.0–50.0)
pCO2, Ven: 49.1 mmHg (ref 45.0–50.0)
pH, Ven: 7.329 — ABNORMAL HIGH (ref 7.250–7.300)
pO2, Ven: 32 mmHg (ref 30.0–45.0)
pO2, Ven: 33 mmHg (ref 30.0–45.0)

## 2011-06-25 LAB — GLUCOSE, CAPILLARY
Glucose-Capillary: 65 mg/dL — ABNORMAL LOW (ref 70–99)
Glucose-Capillary: 65 mg/dL — ABNORMAL LOW (ref 70–99)
Glucose-Capillary: 69 mg/dL — ABNORMAL LOW (ref 70–99)
Glucose-Capillary: 73 mg/dL (ref 70–99)

## 2011-06-25 LAB — STOOL CULTURE

## 2011-06-25 LAB — BASIC METABOLIC PANEL
BUN: 16 mg/dL (ref 6–23)
Calcium: 9.1 mg/dL (ref 8.4–10.5)
Creatinine, Ser: 1.41 mg/dL — ABNORMAL HIGH (ref 0.50–1.35)
GFR calc non Af Amer: 52 mL/min — ABNORMAL LOW (ref >90)
Glucose, Bld: 105 mg/dL — ABNORMAL HIGH (ref 70–99)
Sodium: 137 mEq/L (ref 135–145)

## 2011-06-25 LAB — PROTIME-INR: INR: 1.29 (ref 0.00–1.49)

## 2011-06-25 LAB — CARBOXYHEMOGLOBIN
O2 Saturation: 58.6 %
Total hemoglobin: 9.7 g/dL — ABNORMAL LOW (ref 13.5–18.0)

## 2011-06-25 SURGERY — RIGHT HEART CATH
Anesthesia: LOCAL

## 2011-06-25 MED ORDER — FUROSEMIDE 40 MG PO TABS: 40.0000 mg | ORAL_TABLET | Freq: Two times a day (BID) | ORAL | Status: DC

## 2011-06-25 MED ORDER — SODIUM CHLORIDE 0.9 % IJ SOLN: 3.0000 mL | INTRAMUSCULAR | Status: DC | PRN

## 2011-06-25 MED ORDER — METOPROLOL SUCCINATE ER 50 MG PO TB24: 50.0000 mg | ORAL_TABLET | Freq: Every day | ORAL | Status: DC

## 2011-06-25 MED ORDER — ACETAMINOPHEN 325 MG PO TABS: 650.0000 mg | ORAL_TABLET | ORAL | Status: DC | PRN

## 2011-06-25 MED ORDER — ONDANSETRON HCL 4 MG/2ML IJ SOLN: 4.0000 mg | Freq: Four times a day (QID) | INTRAMUSCULAR | Status: DC | PRN

## 2011-06-25 MED ORDER — SODIUM CHLORIDE 0.9 % IJ SOLN: 3.0000 mL | Freq: Two times a day (BID) | INTRAMUSCULAR | Status: DC

## 2011-06-25 MED ORDER — FENTANYL CITRATE 0.05 MG/ML IJ SOLN
INTRAMUSCULAR | Status: AC
  Filled 2011-06-25: qty 2

## 2011-06-25 MED ORDER — HYDRALAZINE HCL 25 MG PO TABS: 25.0000 mg | ORAL_TABLET | Freq: Three times a day (TID) | ORAL | Status: DC

## 2011-06-25 MED ORDER — MIDAZOLAM HCL 2 MG/2ML IJ SOLN
INTRAMUSCULAR | Status: AC
  Filled 2011-06-25: qty 2

## 2011-06-25 MED ORDER — DEXTROSE 50 % IV SOLN
25.0000 mL | Freq: Once | INTRAVENOUS | Status: AC
  Administered 2011-06-25: 25 mL via INTRAVENOUS
  Filled 2011-06-25: qty 50

## 2011-06-25 NOTE — Progress Notes (Signed)
Pt discharged home.  RN reviewed discharge meds and instructions.  Pt verbalized understanding.

## 2011-06-25 NOTE — Op Note (Signed)
Cardiac Cath Procedure Note:  Indication:  Heart failure  Procedures performed:  1) Right heart catheterization  Description of procedure:   The risks and indication of the procedure were explained. Consent was signed and placed on the chart. An appropriate timeout was taken prior to the procedure. The right neck was prepped and draped in the routine sterile fashion and anesthetized with 1% local lidocaine.   A 7 FR venous sheath was placed in the right internal jugular vein using a modified Seldinger technique. A standard Swan-Ganz catheter was used for the procedure.   Complications: None apparent.  Findings:  RA = 4 RV = 32/6 (7) PA =  45/15 (24) PCW = 7 Fick cardiac output/index = 5.2/2.7 Thermo = 4.0/2.1 PVR = 3.4 Woods FA sat =   94% PA sat = 56%, 57%  Noninvasive BP : 125/92 (72) Calculated SVR  = 1040  Assessment:  1) Well compensated HF after diuresis 2) Mild PAH  Plan/Discussion:  Ok for d/c home later today if access site stable.   [name] 9:29 AM

## 2011-06-25 NOTE — Progress Notes (Signed)
CBG: 65  Treatment: D50 IV 25 mL  Symptoms: None  Follow-up CBG: Time: CBG Result:  Possible Reasons for Event: Inadequate meal intake  Comments/MD notified: Theodore Demark, PA notified of event. Pt left for cath lab at 0815.  RN reported to cath lab staff regarding hypoglycemia and treatment and verbalized their need to follow up on CBG in 15 minutes.    Holley Raring Hilda Lias

## 2011-06-25 NOTE — Progress Notes (Signed)
Pt. Refused daily wt. This a. m.

## 2011-06-25 NOTE — Progress Notes (Signed)
Pt CBG 65 this am, since he is NPO and has CHF, will give 1/2 amp D50. PO diabetes Rx already held.

## 2011-06-25 NOTE — H&P (View-Only) (Signed)
 Subjective:  [name] is a 65 year old male with chronic systolic CHF secondary to NICM echo EF 10-15% by echo 04/2011 (previously 30-35% 06/2010), HTN, anemia, chronic renal insufficiency Cr 1.31, UGI bleed, duodenal AVMs, rectal bleeding 04/2011, DVTs (2004, 2005, 2007, 2010, 2011) however coumadin on hold due to GI bleed, IVC filter 2005, GERD, BPD, and chronic low back pain .  06/18/11 ABI Unable to compress R femoral vein cannot exclude deep vein thrombosis involving the right femoral vein. Incidental findings are consistent with:enlarged lymph node on the right and enlarged lymph node on the left.- No obvious evidence of deep vein thrombosis involving the left lower extremity.   Admitted 2/1 for diuresis due to massive volume overload. PICC line placed  Weight down 34 pounds since admit. Continues on milrinone. Labs still pending. Breathing much better.No orthopnea or PND. Eating a lot of saltines.  CVP = 4  LE Dopplers no clot bilaterally. VQ low prob. Remains tachycardic but improved.    Intake/Output Summary (Last 24 hours) at 06/24/11 0653 Last data filed at 06/24/11 0600  Gross per 24 hour  Intake 2529.6 ml  Output   4801 ml  Net -2271.4 ml    Current meds:    . digoxin  0.125 mg Oral Daily  . enoxaparin (LOVENOX) injection  40 mg Subcutaneous Q24H  . furosemide  80 mg Oral BID  . glipiZIDE  5 mg Oral Q breakfast  . hydrALAZINE  12.5 mg Oral Q8H  . insulin aspart  0-9 Units Subcutaneous TID WC  . insulin glargine  20 Units Subcutaneous QHS  . isosorbide mononitrate  30 mg Oral Daily  . metoprolol succinate  50 mg Oral Daily  . pantoprazole  40 mg Oral Q1200  . potassium chloride  40 mEq Oral BID  . rosuvastatin  20 mg Oral q1800  . sodium chloride  10-40 mL Intracatheter Q12H  . sodium chloride  3 mL Intravenous Q12H  . spironolactone  25 mg Oral Daily  . Tamsulosin HCl  0.4 mg Oral QHS  . DISCONTD: enoxaparin  30 mg Subcutaneous Q24H  . DISCONTD: furosemide  80  mg Intravenous BID   Infusions:    . milrinone 0.125 mcg/kg/min (06/23/11 0900)     Objective:  Blood pressure 123/82, pulse 115, temperature 98.2 F (36.8 C), temperature source Oral, resp. rate 16, height 6' (1.829 m), weight 73.2 kg (161 lb 6 oz), SpO2 92.00%. Weight change: -0.8 kg (-1 lb 12.2 oz)  Physical Exam: CVP 4 General:  Well appearing. No resp difficulty HEENT: normal Neck: supple. JVP 5 . Carotids 2+ bilat; no bruits. No lymphadenopathy or thryomegaly appreciated. Cor: PMI nondisplaced. Tachycardic Regular rate & rhythm. No rubs, S3 gallops or murmurs. Lungs: clear Abdomen: soft, nontender, nondistended. No hepatosplenomegaly. No bruits or masses. Good bowel sounds. Extremities: no cyanosis, clubbing, rash. Tr edema Neuro: alert & orientedx3, cranial nerves grossly intact. moves all 4 extremities w/o difficulty. Affect pleasant  Telemetry: Sinus Tach 120-130  Lab Results: Basic Metabolic Panel:  Lab 06/23/11 0500 06/22/11 0420 06/21/11 0410 06/20/11 1530  NA 133* 136 135 136  K 4.0 4.2 -- --  CL 98 101 103 105  CO2 32 28 25 25  GLUCOSE 126* 51* 95 61*  BUN 16 16 15 15  CREATININE 1.51* 1.51* 1.28 1.19  CALCIUM 8.7 9.1 8.9 8.9  MG -- -- -- 2.0  PHOS -- -- -- --   Liver Function Tests:  Lab 06/20/11 1530  AST 21    ALT 17  ALKPHOS 230*  BILITOT 1.8*  PROT 7.9  ALBUMIN 2.5*   No results found for this basename: LIPASE:5,AMYLASE:5 in the last 168 hours No results found for this basename: AMMONIA:5 in the last 168 hours CBC:  Lab 06/20/11 1530  WBC 4.2  NEUTROABS --  HGB 9.6*  HCT 34.6*  MCV 68.4*  PLT 219   Cardiac Enzymes: No results found for this basename: CKTOTAL:5,CKMB:5,CKMBINDEX:5,TROPONINI:5 in the last 168 hours BNP: No components found with this basename: POCBNP:5 CBG:  Lab 06/23/11 2122 06/23/11 1633 06/23/11 1223 06/23/11 0820 06/22/11 2121  GLUCAP 160* 121* 151* 153* 161*   Microbiology: Lab Results  Component Value Date     CULT NO SUSPICIOUS COLONIES, CONTINUING TO HOLD 06/21/2011   CULT NO GROWTH 5 DAYS 01/12/2009   CULT NO GROWTH 5 DAYS 01/12/2009   CULT NO GROWTH 10/12/2007   CULT NO GROWTH 5 DAYS 10/12/2007    Lab 06/21/11 1344  CULT NO SUSPICIOUS COLONIES, CONTINUING TO HOLD  SDES STOOL    Imaging: Dg Chest 2 View  06/23/2011  *RADIOLOGY REPORT*  Clinical Data: CHF  CHEST - 2 VIEW  Comparison: 06/21/2011  Findings: Heart is top normal in size.  No frank interstitial edema.  No pneumothorax.  Small left pleural effusion.  Associated left lower lobe opacity, likely atelectasis.  Stable right PICC.  Mild degenerative changes of the visualized thoracolumbar spine.  Cholecystectomy clips.  IMPRESSION: No frank interstitial edema.  Small left pleural effusion with associated left lower lobe opacity, likely atelectasis.  Original Report Authenticated By: SRIYESH KRISHNAN, M.D.   Nm Pulmonary Per & Vent  06/23/2011  *RADIOLOGY REPORT*  Clinical Data: *RADIOLOGY REPORT*  Clinical Data:  Shortness of breath and chest pain.  NUCLEAR MEDICINE VENTILATION - PERFUSION LUNG SCAN  Technique:  Wash-in, equilibrium, and wash-out phase ventilation images were obtained using Xe-133 gas.  Perfusion images were obtained in multiple projections after intravenous injection of Tc- 99m MAA.  Radiopharmaceuticals:  10 mCi Xe-133 gas and six mCi Tc-99m MAA.  Comparison:  Plain film of the chest 06/23/2011 at 1326 hours.  Findings: A nonsegmental ill-defined defect is seen in the posterior aspect of the right lower lobe on the right lateral view of the perfusion sequences only.  No segmental or subsegmental defect is identified.  Ventilation imaging demonstrates some mild air trapping in the left mid and upper lung zones.  IMPRESSION:  1.  Low probability for pulmonary embolus. 2.  Mild air trapping left mid and lower lung zones.  Question mucus plugging.  Original Report Authenticated By: THOMAS L. D'ALESSIO, M.D.     ASSESSMENT:  1. A/C  systolic HF with massive volume overload  2. NICM EF 15%  3. Multiple PEs/DVTs s/p IVC filter  --refuses coumadin due to h/o GIB (AVMs)  -- VQ and LE dopplers negative this admit 4. DM2  5. Diarrhea -resolved  6. Acute renal failure   PLAN/DISCUSSION:  Doing well. Will stop milrinone. Titrate hydralazine to 25 tid. Ambulate. Plan RHC tomorrow to assess hemodynamics and optimize home regimen (will do thru IJ). VQ and LE Dopplers are negative. Possibly home tomorrow. Reinforced need for salt restriction. Check labs this am.   Daniel Bensimhon,MD 7:08 AM         

## 2011-06-25 NOTE — Interval H&P Note (Signed)
History and Physical Interval Note:  06/25/2011 8:50 AM  [name]  has presented today for surgery, with the diagnosis of HF The various methods of treatment have been discussed with the patient and family. After consideration of risks, benefits and other options for treatment, the patient has consented to  Procedure(s): RIGHT HEART CATH as a surgical intervention .  The patients' history has been reviewed, patient examined, no change in status, stable for surgery.  I have reviewed the patients' chart and labs.  Questions were answered to the patient's satisfaction.     Daniel Bensimhon

## 2011-06-30 NOTE — H&P (Signed)
Patient seen and examined with Amy Clegg, NP. We discussed all aspects of the encounter. I agree with the assessment and plan as stated above.   

## 2011-07-03 ENCOUNTER — Ambulatory Visit (HOSPITAL_COMMUNITY)
Admission: RE | Admit: 2011-07-03 | Discharge: 2011-07-03 | Disposition: A | Discharge status: Home / Self Care | Payer: PRIVATE HEALTH INSURANCE | Source: Ambulatory Visit | Attending: Internal Medicine | Admitting: Internal Medicine

## 2011-07-03 VITALS — BP 102/64 | HR 126 | Wt 169.0 lb

## 2011-07-03 DIAGNOSIS — I5022 Chronic systolic (congestive) heart failure: Secondary | ICD-10-CM

## 2011-07-03 DIAGNOSIS — R000 Tachycardia, unspecified: Secondary | ICD-10-CM

## 2011-07-03 MED ORDER — METOLAZONE 2.5 MG PO TABS: 2.5000 mg | ORAL_TABLET | ORAL | Status: DC

## 2011-07-03 MED ORDER — HYDRALAZINE HCL 25 MG PO TABS: 37.5000 mg | ORAL_TABLET | Freq: Three times a day (TID) | ORAL | Status: DC

## 2011-07-03 NOTE — Patient Instructions (Signed)
Increase Hydralazine to 1 & 1/2 tabs Three times a day   Start Metolazone 2.5 mg every Monday and Friday  Labs next week with [facility]  Your physician recommends that you schedule a follow-up appointment in: 2 weeks

## 2011-07-03 NOTE — Progress Notes (Signed)
Encounter addended by: Noralee Space, RN on: 07/03/2011 12:58 PM<BR>     Documentation filed: Patient Instructions Section, Orders

## 2011-07-03 NOTE — Progress Notes (Signed)
Patient ID: [name], male   DOB: 09-22-1945, 66 y.o.   MRN: [id number]  HPI:   [name] is a 66 year old male with chronic systolic CHF secondary to NICM echo EF 10-15% by echo 04/2011 (previously 30-35% 06/2010), HTN, anemia, chronic renal insufficiency Cr 1.31,  UGI bleed,  duodenal AVMs,  rectal bleeding 04/2011,  DVTs (2004, 2005, 2007, 2010, 2011) however coumadin on hold due to GI bleed, IVC filter 2005, GERD, BPD, and chronic low back .   Followed by Dr. Shirlee Latch though cared interrupted at times by non-compliance. He is here to establish in HF as part ofpost-hospitalization f/u. Discharged from [facility] 1/3 after admit for ADHF. Discharge weight 182 pounds. Discharged on Lasix 80 mg bid , Losartan 100 mg daily, Toprol XL 100 mg daily, and Spironolactone 25 mg daily. Says he demanded to leave because care on floor was bad.   05/26/11 Potassium 4.0 Creatinine 1.25 06/10/11 Potassium 4.8 Creatinine 1.21  ProBNP 1311  Admitted last week for volume overload. Diuresed from 194 -> 159 on discharge on 06/25/11. RHC at that time  RA = 4  RV = 32/6 (7)  PA = 45/15 (24)  PCW = 7  Fick cardiac output/index = 5.2/2.7  Thermo = 4.0/2.1  PVR = 3.4 Woods  FA sat = 94%  PA sat = 56%, 57%  Noninvasive BP : 125/92 (72)  Calculated SVR = 1040  While in the hospital had repeat u/s of both legs with no DVT. Feels good. When he got home weight at home was 160. Now up to 164. No swelling in legs but mild bloating in belly. Compliant with all medicines. Denies DOE/orthopnea or PND. Taking lasix 40 daily     ROS: All systems negative except as listed in HPI, PMH and Problem List.  Past Medical History  Diagnosis Date  . Venous thromboembolism     DVTs in 2004, 2005, 2007, 8/10, 8/11. IVC filter 2005. Off coumadin 04/2011 secondary to rectal bleeding  . 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 Chronic venous insufficiency   . DM2 (diabetes mellitus, type 2)   . GERD  (gastroesophageal reflux disease)   . BPH (benign prostatic hypertrophy)   . Low back pain   . HTN (hypertension)   . HLD (hyperlipidemia)   . NICM (nonischemic cardiomyopathy) 1. 9/11  2. 2/12    1. Echo septal and apical akinesis, dilated LV, EF 45%, RV normal size and systolic function, EF 43% on Myoview  2. Admitted with decompensated CHF, echo EF 30-35% with diffuse hypokinesis but akinesis of mid to apical anteroseptal wall and apex, moderate LVH, mild MR, grade I diastolic dysfunction, SPEP/UP negative and HIV negative. Most recent EF 10-15% 04/2011  . Smoking   . CAD (coronary artery disease) 1. 2007  2. 10/11    1. Left heart cath with 40-50% stenosis in small RCA EF 50%  2. Lexiscan myoview EF 43%, global hypokinesis, possible small area of apical ischemia; LHC no angiographic CAD, no LV-gram done due to CKD   2. No angiographic CAD by cath 02/2010  . CKD (chronic kidney disease) stage 2, GFR 60-89 ml/min as of 02/2011  . Headache   . Diabetes mellitus   . Blood transfusion   . Anemia     Current Outpatient Prescriptions  Medication Sig Dispense Refill  . atorvastatin (LIPITOR) 20 MG tablet Take 20 mg by mouth daily.        Marland Kitchen  digoxin (LANOXIN) 0.125 MG tablet Take 1 tablet (125 mcg total) by mouth daily.  30 tablet  6  . furosemide (LASIX) 40 MG tablet Take 1 tablet (40 mg total) by mouth 2 (two) times daily.  30 tablet  6  . glipiZIDE (GLUCOTROL XL) 5 MG 24 hr tablet Take 5 mg by mouth daily.        . hydrALAZINE (APRESOLINE) 25 MG tablet Take 1 tablet (25 mg total) by mouth 3 (three) times daily.  90 tablet  6  . insulin glargine (LANTUS) 100 UNIT/ML injection Inject 20 Units into the skin at bedtime.       . isosorbide mononitrate (IMDUR) 30 MG 24 hr tablet Take 1 tablet (30 mg total) by mouth daily.  30 tablet  6  . lansoprazole (PREVACID) 30 MG capsule Take 30 mg by mouth daily.        . metoprolol succinate (TOPROL-XL) 50 MG 24 hr tablet Take 1 tablet (50 mg total) by mouth  daily. Take with or immediately following a meal.  30 tablet  6  . naphazoline (CLEAR EYES) 0.012 % ophthalmic solution Place 2 drops into both eyes at bedtime as needed. For dry/itchy eyes       . spironolactone (ALDACTONE) 25 MG tablet Take 1 tablet (25 mg total) by mouth daily.  30 tablet  2  . Tamsulosin HCl (FLOMAX) 0.4 MG CAPS Take 1 capsule (0.4 mg total) by mouth at bedtime.  30 capsule  6     PHYSICAL EXAM: Filed Vitals:   07/03/11 1158  BP: 102/64  Pulse: 126   Weight change:  169 (159 in hospital) General:  Looks good No resp difficulty  HEENT: normal except for poor dentition Neck: supple. JVP 7-8. Carotids 2+ bilaterally; no bruits. No lymphadenopathy or thryomegaly appreciated. Cor: PMI displaced laterally. Tachycardic  rate & rhythm. soft s3 Lungs: clear Abdomen: soft, nontender, + mildly distended. No hepatosplenomegaly. No bruits or masses. Good bowel sounds. Extremities: no cyanosis, clubbing, rash, 1+ lower extremity edema into thighs (beginning to get tight again) Neuro: alert & orientedx3, cranial nerves grossly intact. Moves all 4 extremities w/o difficulty. Affect pleasant.    ASSESSMENT & PLAN:

## 2011-07-03 NOTE — Assessment & Plan Note (Signed)
Overall doing better. However fluid coming back slowly despite good compliance with medicine. Discussed increasing lasix versus using metolazone. Have decided to use metolazone 2.5 mg on Mon and Friday in order to try to keep weight around 160. Will check labs next week. If weight going up he will call us sooner to come in. HR still fast. Will increase hydralazine to 37.5 tid. See back in 2 weeks.

## 2011-07-04 NOTE — Progress Notes (Signed)
Encounter addended by: Almedia Balls on: 07/04/2011  7:20 AM<BR>     Documentation filed: Charges VN

## 2011-07-10 ENCOUNTER — Telehealth (HOSPITAL_COMMUNITY): Payer: Self-pay | Admitting: *Deleted

## 2011-07-10 DIAGNOSIS — I5022 Chronic systolic (congestive) heart failure: Secondary | ICD-10-CM

## 2011-07-10 NOTE — Telephone Encounter (Signed)
Pt was suppose to have labs w/[facility] today however they are unable to get enough blood, he will go to [facility] in the AM for labs

## 2011-07-11 ENCOUNTER — Other Ambulatory Visit: Payer: PRIVATE HEALTH INSURANCE

## 2011-07-21 ENCOUNTER — Encounter (HOSPITAL_COMMUNITY): Payer: Self-pay

## 2011-07-21 ENCOUNTER — Ambulatory Visit (HOSPITAL_COMMUNITY)
Admission: RE | Admit: 2011-07-21 | Discharge: 2011-07-21 | Disposition: A | Discharge status: Home / Self Care | Payer: PRIVATE HEALTH INSURANCE | Source: Ambulatory Visit | Attending: Internal Medicine | Admitting: Internal Medicine

## 2011-07-21 VITALS — BP 106/58 | HR 110 | Wt 171.0 lb

## 2011-07-21 DIAGNOSIS — I428 Other cardiomyopathies: Secondary | ICD-10-CM | POA: Insufficient documentation

## 2011-07-21 DIAGNOSIS — I509 Heart failure, unspecified: Secondary | ICD-10-CM | POA: Insufficient documentation

## 2011-07-21 DIAGNOSIS — I2510 Atherosclerotic heart disease of native coronary artery without angina pectoris: Secondary | ICD-10-CM | POA: Insufficient documentation

## 2011-07-21 DIAGNOSIS — N182 Chronic kidney disease, stage 2 (mild): Secondary | ICD-10-CM | POA: Insufficient documentation

## 2011-07-21 DIAGNOSIS — K219 Gastro-esophageal reflux disease without esophagitis: Secondary | ICD-10-CM | POA: Insufficient documentation

## 2011-07-21 DIAGNOSIS — D649 Anemia, unspecified: Secondary | ICD-10-CM | POA: Insufficient documentation

## 2011-07-21 DIAGNOSIS — I5022 Chronic systolic (congestive) heart failure: Secondary | ICD-10-CM

## 2011-07-21 DIAGNOSIS — M545 Low back pain, unspecified: Secondary | ICD-10-CM | POA: Insufficient documentation

## 2011-07-21 DIAGNOSIS — F17200 Nicotine dependence, unspecified, uncomplicated: Secondary | ICD-10-CM | POA: Insufficient documentation

## 2011-07-21 DIAGNOSIS — I129 Hypertensive chronic kidney disease with stage 1 through stage 4 chronic kidney disease, or unspecified chronic kidney disease: Secondary | ICD-10-CM | POA: Insufficient documentation

## 2011-07-21 DIAGNOSIS — N400 Enlarged prostate without lower urinary tract symptoms: Secondary | ICD-10-CM | POA: Insufficient documentation

## 2011-07-21 DIAGNOSIS — E785 Hyperlipidemia, unspecified: Secondary | ICD-10-CM | POA: Insufficient documentation

## 2011-07-21 DIAGNOSIS — Z794 Long term (current) use of insulin: Secondary | ICD-10-CM | POA: Insufficient documentation

## 2011-07-21 DIAGNOSIS — Z86718 Personal history of other venous thrombosis and embolism: Secondary | ICD-10-CM | POA: Insufficient documentation

## 2011-07-21 MED ORDER — EPLERENONE 25 MG PO TABS: 25.0000 mg | ORAL_TABLET | Freq: Every day | ORAL | Status: DC

## 2011-07-21 MED ORDER — TORSEMIDE 20 MG PO TABS: 20.0000 mg | ORAL_TABLET | Freq: Every day | ORAL | Status: DC

## 2011-07-21 NOTE — Assessment & Plan Note (Addendum)
Patient seen and examined with [name], NP. We discussed all aspects of the encounter. I agree with the assessment and plan as stated above.    Volume status elevated on exam. Weight up 11 pounds since discharge. Will stop lasix and start Demadex 20 mg twice a day - may need to titrate even higher. Follow weights closely. Reinforced need for daily weights and reviewed use of sliding scale diuretics. Due to painful gynecomastia will stop Spironolactone and start Inspra 25 mg daily. Follow up next week with [facility].

## 2011-07-21 NOTE — Progress Notes (Signed)
Patient ID: [name], male   DOB: 12-26-1945, 66 y.o.   MRN: [id number]  HPI:   [name] is a 66 year old male with chronic systolic CHF secondary to NICM echo EF 10-15% by echo 04/2011 (previously 30-35% 06/2010), HTN, anemia, chronic renal insufficiency Cr 1.31,  UGI bleed,  duodenal AVMs,  rectal bleeding 04/2011,  DVTs (2004, 2005, 2007, 2010, 2011) however coumadin on hold due to GI bleed, IVC filter 2005, GERD, BPD, and chronic low back .   Followed by [name] though cared interrupted at times by non-compliance. He is here to establish in HF as part ofpost-hospitalization f/u. Discharged from [name] 1/3 after admit for ADHF. Discharge weight 182 pounds. Discharged on Lasix 80 mg bid , Losartan 100 mg daily, Toprol XL 100 mg daily, and Spironolactone 25 mg daily. Says he demanded to leave because care on floor was bad.   05/26/11 Potassium 4.0 Creatinine 1.25 06/10/11 Potassium 4.8 Creatinine 1.21  ProBNP 1311  Admitted last week for volume overload. Diuresed from 194 -> 159 on discharge on 06/25/11. RHC at that time  RA = 4  RV = 32/6 (7)  PA = 45/15 (24)  PCW = 7  Fick cardiac output/index = 5.2/2.7  Thermo = 4.0/2.1  PVR = 3.4 Woods  FA sat = 94%  PA sat = 56%, 57%  Noninvasive BP : 125/92 (72)  Calculated SVR = 1040  Discharged weight 160 pounds  He is here for follow up. Weight at home 162-167. Followed by [name] will have telemedicine for weights and blood pressure.  Denies SOB/PND/CP/Orthopnea. Continues to take metolazone 2.5 mg Monday and Friday. He does not feel like he is voiding much with Lasix. Complaining of bilateral breast pain.    ROS: All systems negative except as listed in HPI, PMH and Problem List.  Past Medical History  Diagnosis Date  . Venous thromboembolism     DVTs in 2004, 2005, 2007, 8/10, 8/11. IVC filter 2005. Off coumadin 04/2011 secondary to rectal bleeding  . 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 Chronic venous  insufficiency   . DM2 (diabetes mellitus, type 2)   . GERD (gastroesophageal reflux disease)   . BPH (benign prostatic hypertrophy)   . Low back pain   . HTN (hypertension)   . HLD (hyperlipidemia)   . NICM (nonischemic cardiomyopathy) 1. 9/11  2. 2/12    1. Echo septal and apical akinesis, dilated LV, EF 45%, RV normal size and systolic function, EF 43% on Myoview  2. Admitted with decompensated CHF, echo EF 30-35% with diffuse hypokinesis but akinesis of mid to apical anteroseptal wall and apex, moderate LVH, mild MR, grade I diastolic dysfunction, SPEP/UP negative and HIV negative. Most recent EF 10-15% 04/2011  . Smoking   . CAD (coronary artery disease) 1. 2007  2. 10/11    1. Left heart cath with 40-50% stenosis in small RCA EF 50%  2. Lexiscan myoview EF 43%, global hypokinesis, possible small area of apical ischemia; LHC no angiographic CAD, no LV-gram done due to CKD   2. No angiographic CAD by cath 02/2010  . CKD (chronic kidney disease) stage 2, GFR 60-89 ml/min as of 02/2011  . Headache   . Diabetes mellitus   . Blood transfusion   . Anemia     Current Outpatient Prescriptions  Medication Sig Dispense Refill  . atorvastatin (LIPITOR) 20 MG tablet Take 20 mg by mouth daily.        Marland Kitchen  digoxin (LANOXIN) 0.125 MG tablet Take 1 tablet (125 mcg total) by mouth daily.  30 tablet  6  . furosemide (LASIX) 40 MG tablet Take 1 tablet (40 mg total) by mouth 2 (two) times daily.  30 tablet  6  . glipiZIDE (GLUCOTROL XL) 5 MG 24 hr tablet Take 5 mg by mouth daily.        . hydrALAZINE (APRESOLINE) 25 MG tablet Take 1.5 tablets (37.5 mg total) by mouth 3 (three) times daily.  135 tablet  3  . insulin glargine (LANTUS) 100 UNIT/ML injection Inject 20 Units into the skin at bedtime.       . isosorbide mononitrate (IMDUR) 30 MG 24 hr tablet Take 1 tablet (30 mg total) by mouth daily.  30 tablet  6  . lansoprazole (PREVACID) 30 MG capsule Take 30 mg by mouth daily.        . metolazone  (ZAROXOLYN) 2.5 MG tablet Take 1 tablet (2.5 mg total) by mouth 2 (two) times a week. Every Monday and Friday  10 tablet  3  . metoprolol succinate (TOPROL-XL) 50 MG 24 hr tablet Take 1 tablet (50 mg total) by mouth daily. Take with or immediately following a meal.  30 tablet  6  . naphazoline (CLEAR EYES) 0.012 % ophthalmic solution Place 2 drops into both eyes at bedtime as needed. For dry/itchy eyes       . spironolactone (ALDACTONE) 25 MG tablet Take 1 tablet (25 mg total) by mouth daily.  30 tablet  2  . Tamsulosin HCl (FLOMAX) 0.4 MG CAPS Take 1 capsule (0.4 mg total) by mouth at bedtime.  30 capsule  6     PHYSICAL EXAM: Filed Vitals:   07/21/11 1153  BP: 106/58  Pulse: 110   Weight change:  171 (169) General:  Looks good No resp difficulty  HEENT: normal except for poor dentition Neck: supple. JVP 10-11 7-8. Carotids 2+ bilaterally; no bruits. No lymphadenopathy or thryomegaly appreciated. Cor: PMI displaced laterally. Tachycardic  rate & rhythm. soft s3 Lungs: clear Abdomen: soft, nontender, + mildly distended. No hepatosplenomegaly. No bruits or masses. Good bowel sounds. Extremities: no cyanosis, clubbing, rash,  LLE 1+ RLE 2+ Neuro: alert & orientedx3, cranial nerves grossly intact. Moves all 4 extremities w/o difficulty. Affect pleasant.    ASSESSMENT & PLAN:

## 2011-07-21 NOTE — Patient Instructions (Signed)
Stop Lasix  Stop Spironolactone  Take Demadex 20 mg twice a day  Take Inspra 25 mg daily  Follow up in 1 week  Do the following things EVERYDAY: 1) Weigh yourself in the morning before breakfast. Write it down and keep it in a log. 2) Take your medicines as prescribed 3) Eat low salt foods--Limit salt (sodium) to 2000mg  per day.  4) Stay as active as you can everyday

## 2011-07-23 ENCOUNTER — Telehealth (HOSPITAL_COMMUNITY): Payer: Self-pay | Admitting: Vascular Surgery

## 2011-07-23 NOTE — Telephone Encounter (Signed)
Spoke w/Melody she states pt needs refills for torsemide, inspra and hydralazine sent to [facility], there phone # is [phone], called in pt is aware

## 2011-07-23 NOTE — Telephone Encounter (Signed)
Herbert Seta,          [facility]urse called from [facility] she needs to talk to you about Dorathy Kinsman. Melodey Wall [phone]

## 2011-07-25 ENCOUNTER — Telehealth (HOSPITAL_COMMUNITY): Payer: Self-pay | Admitting: *Deleted

## 2011-07-25 NOTE — Telephone Encounter (Signed)
[name] called today.  He said that he feels like he has the chills.  He did not take his temp and his home health nurse will be there at 2.  I just want to give you a heads up.  Thanks.

## 2011-07-28 ENCOUNTER — Encounter (HOSPITAL_COMMUNITY): Payer: PRIVATE HEALTH INSURANCE

## 2011-07-29 ENCOUNTER — Ambulatory Visit (HOSPITAL_COMMUNITY)
Admission: RE | Admit: 2011-07-29 | Discharge: 2011-07-29 | Disposition: A | Discharge status: Home / Self Care | Payer: PRIVATE HEALTH INSURANCE | Source: Ambulatory Visit | Attending: Internal Medicine | Admitting: Internal Medicine

## 2011-07-29 VITALS — BP 80/50 | HR 120 | Wt 164.5 lb

## 2011-07-29 DIAGNOSIS — K219 Gastro-esophageal reflux disease without esophagitis: Secondary | ICD-10-CM | POA: Insufficient documentation

## 2011-07-29 DIAGNOSIS — I2510 Atherosclerotic heart disease of native coronary artery without angina pectoris: Secondary | ICD-10-CM | POA: Insufficient documentation

## 2011-07-29 DIAGNOSIS — F17200 Nicotine dependence, unspecified, uncomplicated: Secondary | ICD-10-CM | POA: Insufficient documentation

## 2011-07-29 DIAGNOSIS — E119 Type 2 diabetes mellitus without complications: Secondary | ICD-10-CM | POA: Insufficient documentation

## 2011-07-29 DIAGNOSIS — Q2733 Arteriovenous malformation of digestive system vessel: Secondary | ICD-10-CM | POA: Insufficient documentation

## 2011-07-29 DIAGNOSIS — I5022 Chronic systolic (congestive) heart failure: Secondary | ICD-10-CM

## 2011-07-29 DIAGNOSIS — I428 Other cardiomyopathies: Secondary | ICD-10-CM | POA: Insufficient documentation

## 2011-07-29 DIAGNOSIS — Z794 Long term (current) use of insulin: Secondary | ICD-10-CM | POA: Insufficient documentation

## 2011-07-29 DIAGNOSIS — I509 Heart failure, unspecified: Secondary | ICD-10-CM | POA: Insufficient documentation

## 2011-07-29 DIAGNOSIS — D649 Anemia, unspecified: Secondary | ICD-10-CM | POA: Insufficient documentation

## 2011-07-29 DIAGNOSIS — Z86718 Personal history of other venous thrombosis and embolism: Secondary | ICD-10-CM | POA: Insufficient documentation

## 2011-07-29 DIAGNOSIS — N182 Chronic kidney disease, stage 2 (mild): Secondary | ICD-10-CM | POA: Insufficient documentation

## 2011-07-29 DIAGNOSIS — K625 Hemorrhage of anus and rectum: Secondary | ICD-10-CM | POA: Insufficient documentation

## 2011-07-29 DIAGNOSIS — Z9889 Other specified postprocedural states: Secondary | ICD-10-CM | POA: Insufficient documentation

## 2011-07-29 DIAGNOSIS — N400 Enlarged prostate without lower urinary tract symptoms: Secondary | ICD-10-CM | POA: Insufficient documentation

## 2011-07-29 DIAGNOSIS — I129 Hypertensive chronic kidney disease with stage 1 through stage 4 chronic kidney disease, or unspecified chronic kidney disease: Secondary | ICD-10-CM | POA: Insufficient documentation

## 2011-07-29 LAB — BASIC METABOLIC PANEL
CO2: 31 mEq/L (ref 19–32)
Calcium: 9.3 mg/dL (ref 8.4–10.5)
Creatinine, Ser: 2.23 mg/dL — ABNORMAL HIGH (ref 0.50–1.35)
Glucose, Bld: 226 mg/dL — ABNORMAL HIGH (ref 70–99)

## 2011-07-29 MED ORDER — METOLAZONE 2.5 MG PO TABS: 2.5000 mg | ORAL_TABLET | ORAL | Status: DC | PRN

## 2011-07-29 NOTE — Patient Instructions (Addendum)
Hold Demadex tonight   Stop Metolazne  Schedule CPX   Follow up in 2 weeks  Do the following things EVERYDAY: 1) Weigh yourself in the morning before breakfast. Write it down and keep it in a log. 2) Take your medicines as prescribed 3) Eat low salt foods--Limit salt (sodium) to 2000mg  per day.  4) Stay as active as you can everyday

## 2011-07-29 NOTE — Progress Notes (Signed)
Patient ID: [name], male   DOB: 08/21/1945, 66 y.o.   MRN: [id number]  HPI:   [name] is a 66 year old male with chronic systolic CHF secondary to NICM echo EF 10-15% by echo 04/2011 (previously 30-35% 06/2010), HTN, anemia, chronic renal insufficiency Cr 1.31,  UGI bleed,  duodenal AVMs,  rectal bleeding 04/2011,  DVTs (2004, 2005, 2007, 2010, 2011) however coumadin on hold due to GI bleed, IVC filter 2005, GERD, BPD, and chronic low back .   05/26/11 Potassium 4.0 Creatinine 1.25 06/10/11 Potassium 4.8 Creatinine 1.21  ProBNP 1311  Discharged 2/6 Weight 159 pounds.   RHC 2/13 RA = 4  RV = 32/6 (7)  PA = 45/15 (24)  PCW = 7  Fick cardiac output/index = 5.2/2.7  Thermo = 4.0/2.1  PVR = 3.4 Woods  FA sat = 94%  PA sat = 56%, 57%  Noninvasive BP : 125/92 (72)  Calculated SVR = 1040  At last visit weight up 11 pounds since discharge. Lasix switched to demadex 20 bid and spiro switched to eplerenone due to gynecomastia.  He is here for follow up. Denies SOB/Orhtopnea/PND. Complains of dizziness.  Breast tenderness resolved. Weight at home down from 168 to 162 pounds. Complaint with medications. Followed by [facility] 3x per week for heart failure.     ROS: All systems negative except as listed in HPI, PMH and Problem List.  Past Medical History  Diagnosis Date  . Venous thromboembolism     DVTs in 2004, 2005, 2007, 8/10, 8/11. IVC filter 2005. Off coumadin 04/2011 secondary to rectal bleeding  . 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 Chronic venous insufficiency   . DM2 (diabetes mellitus, type 2)   . GERD (gastroesophageal reflux disease)   . BPH (benign prostatic hypertrophy)   . Low back pain   . HTN (hypertension)   . HLD (hyperlipidemia)   . NICM (nonischemic cardiomyopathy) 1. 9/11  2. 2/12    1. Echo septal and apical akinesis, dilated LV, EF 45%, RV normal size and systolic function, EF 43% on Myoview  2. Admitted with decompensated CHF, echo EF  30-35% with diffuse hypokinesis but akinesis of mid to apical anteroseptal wall and apex, moderate LVH, mild MR, grade I diastolic dysfunction, SPEP/UP negative and HIV negative. Most recent EF 10-15% 04/2011  . Smoking   . CAD (coronary artery disease) 1. 2007  2. 10/11    1. Left heart cath with 40-50% stenosis in small RCA EF 50%  2. Lexiscan myoview EF 43%, global hypokinesis, possible small area of apical ischemia; LHC no angiographic CAD, no LV-gram done due to CKD   2. No angiographic CAD by cath 02/2010  . CKD (chronic kidney disease) stage 2, GFR 60-89 ml/min as of 02/2011  . Headache   . Diabetes mellitus   . Blood transfusion   . Anemia     Current Outpatient Prescriptions  Medication Sig Dispense Refill  . atorvastatin (LIPITOR) 20 MG tablet Take 20 mg by mouth daily.        . digoxin (LANOXIN) 0.125 MG tablet Take 1 tablet (125 mcg total) by mouth daily.  30 tablet  6  . eplerenone (INSPRA) 25 MG tablet Take 1 tablet (25 mg total) by mouth daily.  30 tablet  6  . glipiZIDE (GLUCOTROL XL) 5 MG 24 hr tablet Take 5 mg by mouth daily.        . hydrALAZINE (APRESOLINE)  25 MG tablet Take 1.5 tablets (37.5 mg total) by mouth 3 (three) times daily.  135 tablet  3  . insulin glargine (LANTUS) 100 UNIT/ML injection Inject 20 Units into the skin at bedtime.       . isosorbide mononitrate (IMDUR) 30 MG 24 hr tablet Take 1 tablet (30 mg total) by mouth daily.  30 tablet  6  . lansoprazole (PREVACID) 30 MG capsule Take 30 mg by mouth daily.        . metolazone (ZAROXOLYN) 2.5 MG tablet Take 1 tablet (2.5 mg total) by mouth 2 (two) times a week. Every Monday and Friday  10 tablet  3  . metoprolol succinate (TOPROL-XL) 50 MG 24 hr tablet Take 1 tablet (50 mg total) by mouth daily. Take with or immediately following a meal.  30 tablet  6  . naphazoline (CLEAR EYES) 0.012 % ophthalmic solution Place 2 drops into both eyes at bedtime as needed. For dry/itchy eyes       . Tamsulosin HCl (FLOMAX) 0.4  MG CAPS Take 1 capsule (0.4 mg total) by mouth at bedtime.  30 capsule  6  . torsemide (DEMADEX) 20 MG tablet Take 1 tablet (20 mg total) by mouth daily.  60 tablet  6     PHYSICAL EXAM: Filed Vitals:   07/29/11 0950  BP: 80/50  Pulse: 120   Weight change:  164 (171) General:  Looks good No resp difficulty  HEENT: normal except for poor dentition Neck: supple. JVP 5-6. Carotids 2+ bilaterally; no bruits. No lymphadenopathy or thryomegaly appreciated. Cor: PMI displaced laterally. Tachycardic  rate & rhythm. soft s3 Lungs: clear Abdomen: soft, nontender, + mildly distended. No hepatosplenomegaly. No bruits or masses. Good bowel sounds. Extremities: no cyanosis, clubbing, rash,  lower extremity edema 1+ Neuro: alert & orientedx3, cranial nerves grossly intact. Moves all 4 extremities w/o difficulty. Affect pleasant.    ASSESSMENT & PLAN:

## 2011-07-29 NOTE — Progress Notes (Signed)
Encounter addended by: Dolores Patty, MD on: 07/29/2011  2:08 PM<BR>     Documentation filed: Follow-up Section, LOS Section

## 2011-07-29 NOTE — Assessment & Plan Note (Addendum)
NYHA III. Appears dry. Weight down 7 pounds over the last week. BP soft but not orthostatic and remains tachycardic. Will hold pm dose demadex. Stop Metolazone. Check BMET and digoxin level today.  Will need repeat ECHO in April. Obtain CPX. Discussed [facility] therapies. Instructed to call if he is SOB/dizzy/edema.  Follow up in 2 weeks.   Patient seen and examined with [name], NP. We discussed all aspects of the encounter. I agree with the assessment and plan as stated above.  He remains quite tenuous with soft BP and ongoing tachycardia. Volume status better on exam. Agree with stopping metolazone for now. Will order CPX and repeat echo. May need to consider advanced therapies but not sure he has the resources to pull it off. Time spent 40 mins.

## 2011-08-08 ENCOUNTER — Telehealth (HOSPITAL_COMMUNITY): Payer: Self-pay | Admitting: *Deleted

## 2011-08-08 NOTE — Telephone Encounter (Signed)
Shawna Orleans called today to let us know that Mr Adinolfi has decided that he no longer wants services from [facility].  She is concerned that he may have a change of heart in the near future, but wants [place] to be aware.

## 2011-08-13 ENCOUNTER — Ambulatory Visit (HOSPITAL_COMMUNITY): Payer: PRIVATE HEALTH INSURANCE | Attending: Adult Health

## 2011-08-13 DIAGNOSIS — I5022 Chronic systolic (congestive) heart failure: Secondary | ICD-10-CM

## 2011-08-13 DIAGNOSIS — J988 Other specified respiratory disorders: Secondary | ICD-10-CM

## 2011-08-13 DIAGNOSIS — I509 Heart failure, unspecified: Secondary | ICD-10-CM | POA: Insufficient documentation

## 2011-08-18 ENCOUNTER — Encounter (HOSPITAL_COMMUNITY): Payer: Self-pay

## 2011-08-18 ENCOUNTER — Ambulatory Visit (HOSPITAL_COMMUNITY)
Admission: RE | Admit: 2011-08-18 | Discharge: 2011-08-18 | Disposition: A | Discharge status: Home / Self Care | Payer: PRIVATE HEALTH INSURANCE | Source: Ambulatory Visit | Attending: Internal Medicine | Admitting: Internal Medicine

## 2011-08-18 VITALS — BP 84/60 | HR 98 | Wt 171.0 lb

## 2011-08-18 DIAGNOSIS — I5022 Chronic systolic (congestive) heart failure: Secondary | ICD-10-CM | POA: Insufficient documentation

## 2011-08-18 MED ORDER — ESOMEPRAZOLE MAGNESIUM 40 MG PO CPDR: 40.0000 mg | DELAYED_RELEASE_CAPSULE | Freq: Every day | ORAL | Status: DC

## 2011-08-18 MED ORDER — METOLAZONE 2.5 MG PO TABS: ORAL_TABLET | ORAL | Status: DC

## 2011-08-18 NOTE — Patient Instructions (Addendum)
Schedule ECHO  Take Metolazone 2.5 mg for the next two days then Metolazone 2.5 mg every Monday    Do the following things EVERYDAY: 1) Weigh yourself in the morning before breakfast. Write it down and keep it in a log. 2) Take your medicines as prescribed 3) Eat low salt foods--Limit salt (sodium) to 2000mg  per day.  4) Stay as active as you can everyday  Follow up 2 weeks

## 2011-08-18 NOTE — Progress Notes (Signed)
Patient ID: [name], male   DOB: 04-10-1946, 66 y.o.   MRN: [id number]  HPI:  [name] is a 66 year old male with chronic systolic CHF secondary to NICM echo EF 10-15% by echo 04/2011 (previously 30-35% 06/2010), HTN, anemia, chronic renal insufficiency Cr 1.31,  UGI bleed,  duodenal AVMs,  rectal bleeding 04/2011,  DVTs (2004, 2005, 2007, 2010, 2011) however coumadin on hold due to GI bleed, IVC filter 2005, GERD, BPD, and chronic low back .   05/26/11 Potassium 4.0 Creatinine 1.25 06/10/11 Potassium 4.8 Creatinine 1.21  ProBNP 1311  Discharged 2/6 Weight 159 pounds.   RHC 2/13 RA = 4  RV = 32/6 (7)  PA = 45/15 (24)  PCW = 7  Fick cardiac output/index = 5.2/2.7  Thermo = 4.0/2.1  PVR = 3.4 Woods  FA sat = 94%  PA sat = 56%, 57%  Noninvasive BP : 125/92 (72)  Calculated SVR = 1040  Spironolactone switched to eplerenone due to gynecomastia.  08/13/11 CPX  RER 1.16 Peak VO2 18.6 % Predicted 61.4% VE/VCO2 slope 42.8  Last visit Metolazone stopped due to hypotension.   He is here for follow up. Denies SOB/Orhtopnea/PND. Complains of intermittent light headedness.  Weight at home 165-171. Lower extremity edema trace. Complaint with medications. HH RN stopped due to cancellations.     ROS: All systems negative except as listed in HPI, PMH and Problem List.  Past Medical History  Diagnosis Date  . Venous thromboembolism     DVTs in 2004, 2005, 2007, 8/10, 8/11. IVC filter 2005. Off coumadin 04/2011 secondary to rectal bleeding  . 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 Chronic venous insufficiency   . DM2 (diabetes mellitus, type 2)   . GERD (gastroesophageal reflux disease)   . BPH (benign prostatic hypertrophy)   . Low back pain   . HTN (hypertension)   . HLD (hyperlipidemia)   . NICM (nonischemic cardiomyopathy) 1. 9/11  2. 2/12    1. Echo septal and apical akinesis, dilated LV, EF 45%, RV normal size and systolic function, EF 43% on Myoview  2.  Admitted with decompensated CHF, echo EF 30-35% with diffuse hypokinesis but akinesis of mid to apical anteroseptal wall and apex, moderate LVH, mild MR, grade I diastolic dysfunction, SPEP/UP negative and HIV negative. Most recent EF 10-15% 04/2011  . Smoking   . CAD (coronary artery disease) 1. 2007  2. 10/11    1. Left heart cath with 40-50% stenosis in small RCA EF 50%  2. Lexiscan myoview EF 43%, global hypokinesis, possible small area of apical ischemia; LHC no angiographic CAD, no LV-gram done due to CKD   2. No angiographic CAD by cath 02/2010  . CKD (chronic kidney disease) stage 2, GFR 60-89 ml/min as of 02/2011  . Headache   . Diabetes mellitus   . Blood transfusion   . Anemia     Current Outpatient Prescriptions  Medication Sig Dispense Refill  . atorvastatin (LIPITOR) 20 MG tablet Take 20 mg by mouth daily.        . digoxin (LANOXIN) 0.125 MG tablet Take 1 tablet (125 mcg total) by mouth daily.  30 tablet  6  . eplerenone (INSPRA) 25 MG tablet Take 1 tablet (25 mg total) by mouth daily.  30 tablet  6  . glipiZIDE (GLUCOTROL XL) 5 MG 24 hr tablet Take 5 mg by mouth daily.        Marland Kitchen  hydrALAZINE (APRESOLINE) 25 MG tablet Take 1.5 tablets (37.5 mg total) by mouth 3 (three) times daily.  135 tablet  3  . insulin glargine (LANTUS) 100 UNIT/ML injection Inject 20 Units into the skin at bedtime.       . isosorbide mononitrate (IMDUR) 30 MG 24 hr tablet Take 1 tablet (30 mg total) by mouth daily.  30 tablet  6  . lansoprazole (PREVACID) 30 MG capsule Take 30 mg by mouth daily.        . metolazone (ZAROXOLYN) 2.5 MG tablet Take 1 tablet (2.5 mg total) by mouth as needed. Every Monday and Friday  10 tablet  3  . metoprolol succinate (TOPROL-XL) 50 MG 24 hr tablet Take 1 tablet (50 mg total) by mouth daily. Take with or immediately following a meal.  30 tablet  6  . naphazoline (CLEAR EYES) 0.012 % ophthalmic solution Place 2 drops into both eyes at bedtime as needed. For dry/itchy eyes         . Tamsulosin HCl (FLOMAX) 0.4 MG CAPS Take 1 capsule (0.4 mg total) by mouth at bedtime.  30 capsule  6  . torsemide (DEMADEX) 20 MG tablet Take 1 tablet (20 mg total) by mouth daily.  60 tablet  6     PHYSICAL EXAM: Filed Vitals:   08/18/11 1343  BP: 84/60  Pulse: 98   Weight change:  171 (164) General:  Looks good No resp difficulty  HEENT: normal except for poor dentition Neck: supple. JVP 9 . Carotids 2+ bilaterally; no bruits. No lymphadenopathy or thryomegaly appreciated. Cor: PMI displaced laterally. Regular  rate & rhythm. soft s3 Lungs: clear Abdomen: soft, nontender, + distended. No hepatosplenomegaly. No bruits or masses. Good bowel sounds. Extremities: no cyanosis, clubbing, rash,  lower extremity edema 2+ Neuro: alert & orientedx3, cranial nerves grossly intact. Moves all 4 extremities w/o difficulty. Affect pleasant.    ASSESSMENT & PLAN:

## 2011-08-18 NOTE — Assessment & Plan Note (Addendum)
NYHA III. Volume status is trending up. Weight up 7 pounds. SBP remains soft. Will give Metolazone 2.5 mg for two days then give every Monday. Discussed CPX results. VO2 18 Slope 42. Will need HH therefore will refer to [facility]. Follow up in week.  Patient seen and examined with [name], NP. We discussed all aspects of the encounter. I agree with the assessment and plan as stated above.  Weight contiues to trend up despite what seems to be improved compliance. Will add weekly metolazone. CPX test results reviewed. Fairly advanced HF but does not meet criteria for advanced therapies at this point. Picture also complicated by social challenges. Will see if [facility] can help as he was disatisifed with [facility].

## 2011-08-26 ENCOUNTER — Ambulatory Visit (HOSPITAL_COMMUNITY)
Admission: RE | Admit: 2011-08-26 | Discharge: 2011-08-26 | Disposition: A | Discharge status: Home / Self Care | Payer: PRIVATE HEALTH INSURANCE | Source: Ambulatory Visit | Attending: Adult Health | Admitting: Adult Health

## 2011-08-26 DIAGNOSIS — I5042 Chronic combined systolic (congestive) and diastolic (congestive) heart failure: Secondary | ICD-10-CM | POA: Insufficient documentation

## 2011-08-26 DIAGNOSIS — Z86718 Personal history of other venous thrombosis and embolism: Secondary | ICD-10-CM | POA: Insufficient documentation

## 2011-08-26 DIAGNOSIS — I509 Heart failure, unspecified: Secondary | ICD-10-CM | POA: Insufficient documentation

## 2011-08-26 DIAGNOSIS — I5022 Chronic systolic (congestive) heart failure: Secondary | ICD-10-CM

## 2011-08-26 DIAGNOSIS — N189 Chronic kidney disease, unspecified: Secondary | ICD-10-CM | POA: Insufficient documentation

## 2011-08-26 DIAGNOSIS — E119 Type 2 diabetes mellitus without complications: Secondary | ICD-10-CM | POA: Insufficient documentation

## 2011-08-26 DIAGNOSIS — I129 Hypertensive chronic kidney disease with stage 1 through stage 4 chronic kidney disease, or unspecified chronic kidney disease: Secondary | ICD-10-CM | POA: Insufficient documentation

## 2011-08-26 DIAGNOSIS — I428 Other cardiomyopathies: Secondary | ICD-10-CM | POA: Insufficient documentation

## 2011-08-26 DIAGNOSIS — I369 Nonrheumatic tricuspid valve disorder, unspecified: Secondary | ICD-10-CM

## 2011-08-26 NOTE — Progress Notes (Signed)
  Echocardiogram 2D Echocardiogram has been performed.  [name] 08/26/2011, 11:56 AM

## 2011-09-02 ENCOUNTER — Emergency Department (HOSPITAL_COMMUNITY): Payer: PRIVATE HEALTH INSURANCE

## 2011-09-02 ENCOUNTER — Encounter (HOSPITAL_COMMUNITY): Payer: Self-pay | Admitting: Emergency Medicine

## 2011-09-02 ENCOUNTER — Emergency Department (HOSPITAL_COMMUNITY)
Admission: EM | Admit: 2011-09-02 | Discharge: 2011-09-02 | Disposition: A | Discharge status: Home / Self Care | Payer: PRIVATE HEALTH INSURANCE | Attending: Emergency Medicine | Admitting: Emergency Medicine

## 2011-09-02 ENCOUNTER — Ambulatory Visit (HOSPITAL_COMMUNITY)
Admission: RE | Admit: 2011-09-02 | Discharge: 2011-09-02 | Disposition: A | Discharge status: Home / Self Care | Payer: PRIVATE HEALTH INSURANCE | Source: Ambulatory Visit | Attending: Internal Medicine | Admitting: Internal Medicine

## 2011-09-02 ENCOUNTER — Other Ambulatory Visit: Payer: Self-pay

## 2011-09-02 DIAGNOSIS — K219 Gastro-esophageal reflux disease without esophagitis: Secondary | ICD-10-CM | POA: Insufficient documentation

## 2011-09-02 DIAGNOSIS — I5022 Chronic systolic (congestive) heart failure: Secondary | ICD-10-CM

## 2011-09-02 DIAGNOSIS — R109 Unspecified abdominal pain: Secondary | ICD-10-CM | POA: Insufficient documentation

## 2011-09-02 DIAGNOSIS — Z794 Long term (current) use of insulin: Secondary | ICD-10-CM | POA: Insufficient documentation

## 2011-09-02 DIAGNOSIS — Z86718 Personal history of other venous thrombosis and embolism: Secondary | ICD-10-CM | POA: Insufficient documentation

## 2011-09-02 DIAGNOSIS — Z79899 Other long term (current) drug therapy: Secondary | ICD-10-CM | POA: Insufficient documentation

## 2011-09-02 DIAGNOSIS — E785 Hyperlipidemia, unspecified: Secondary | ICD-10-CM | POA: Insufficient documentation

## 2011-09-02 DIAGNOSIS — E119 Type 2 diabetes mellitus without complications: Secondary | ICD-10-CM | POA: Insufficient documentation

## 2011-09-02 DIAGNOSIS — I129 Hypertensive chronic kidney disease with stage 1 through stage 4 chronic kidney disease, or unspecified chronic kidney disease: Secondary | ICD-10-CM | POA: Insufficient documentation

## 2011-09-02 DIAGNOSIS — R079 Chest pain, unspecified: Secondary | ICD-10-CM | POA: Insufficient documentation

## 2011-09-02 DIAGNOSIS — N182 Chronic kidney disease, stage 2 (mild): Secondary | ICD-10-CM | POA: Insufficient documentation

## 2011-09-02 DIAGNOSIS — I2510 Atherosclerotic heart disease of native coronary artery without angina pectoris: Secondary | ICD-10-CM | POA: Insufficient documentation

## 2011-09-02 LAB — CBC
HCT: 37.2 % — ABNORMAL LOW (ref 39.0–52.0)
Hemoglobin: 11.2 g/dL — ABNORMAL LOW (ref 13.0–17.0)
MCV: 75.6 fL — ABNORMAL LOW (ref 78.0–100.0)
RBC: 4.92 MIL/uL (ref 4.22–5.81)
WBC: 6 10*3/uL (ref 4.0–10.5)

## 2011-09-02 LAB — HEPATIC FUNCTION PANEL
ALT: 17 U/L (ref 0–53)
AST: 20 U/L (ref 0–37)
Albumin: 3.6 g/dL (ref 3.5–5.2)
Bilirubin, Direct: 0.1 mg/dL (ref 0.0–0.3)
Total Bilirubin: 0.4 mg/dL (ref 0.3–1.2)

## 2011-09-02 LAB — BASIC METABOLIC PANEL
BUN: 31 mg/dL — ABNORMAL HIGH (ref 6–23)
CO2: 31 mEq/L (ref 19–32)
Chloride: 96 mEq/L (ref 96–112)
Creatinine, Ser: 1.93 mg/dL — ABNORMAL HIGH (ref 0.50–1.35)
GFR calc Af Amer: 41 mL/min — ABNORMAL LOW (ref >90)
Potassium: 3.5 mEq/L (ref 3.5–5.1)

## 2011-09-02 LAB — POCT I-STAT, CHEM 8
Chloride: 97 mEq/L (ref 96–112)
Creatinine, Ser: 2 mg/dL — ABNORMAL HIGH (ref 0.50–1.35)
Glucose, Bld: 152 mg/dL — ABNORMAL HIGH (ref 70–99)
HCT: 41 % (ref 39.0–52.0)
Hemoglobin: 13.9 g/dL (ref 13.0–17.0)
Potassium: 3.4 mEq/L — ABNORMAL LOW (ref 3.5–5.1)
Sodium: 140 mEq/L (ref 135–145)

## 2011-09-02 LAB — LIPASE, BLOOD: Lipase: 106 U/L — ABNORMAL HIGH (ref 11–59)

## 2011-09-02 LAB — POCT I-STAT TROPONIN I: Troponin i, poc: 0 ng/mL (ref 0.00–0.08)

## 2011-09-02 NOTE — ED Notes (Signed)
Cup of water given to pt per Dr. Adriana Simas

## 2011-09-02 NOTE — ED Notes (Signed)
Pt returned from xray

## 2011-09-02 NOTE — Discharge Instructions (Signed)
Tests were normal. Followup your primary care Dr. °

## 2011-09-02 NOTE — ED Notes (Signed)
MD at bedside. 

## 2011-09-02 NOTE — ED Notes (Signed)
Pt transported to xray 

## 2011-09-02 NOTE — Progress Notes (Signed)
HPI:  [name] is a 66 year old male with chronic systolic CHF secondary to NICM echo EF 10-15% by echo 04/2011 (previously 30-35% 06/2010), HTN, anemia, chronic renal insufficiency Cr 1.31,  UGI bleed,  duodenal AVMs,  rectal bleeding 04/2011,  DVTs (2004, 2005, 2007, 2010, 2011) however coumadin on hold due to GI bleed, IVC filter 2005, GERD, BPD, and chronic low back .   05/26/11 Potassium 4.0 Creatinine 1.25 06/10/11 Potassium 4.8 Creatinine 1.21  ProBNP 1311  Discharged 2/6 Weight 159 pounds.   RHC 2/13 RA = 4  RV = 32/6 (7)  PA = 45/15 (24)  PCW = 7  Fick cardiac output/index = 5.2/2.7  Thermo = 4.0/2.1  PVR = 3.4 Woods  FA sat = 94%  PA sat = 56%, 57%  Noninvasive BP : 125/92 (72)  Calculated SVR = 1040  Spironolactone switched to eplerenone due to gynecomastia.  08/13/11 CPX  RER 1.16 Peak VO2 18.6 % Predicted 61.4% VE/VCO2 slope 42.8  Here for follow up today.  Upon waiting on his appointment today, he experienced left sided chest/abdominal pain.  Pain intensified walking back to clinic, 9/10.  It is non radiating.  He had never experienced this before.  Oxygen placed.  EKG obtained, SR without acute ST-T waves changes.   He denies N/V/diaphoresis.  No orthopnea/PND.  BM yesterday.   Reported weight at home 164 pounds.      ROS: All systems negative except as listed in HPI, PMH and Problem List.  Past Medical History  Diagnosis Date  . Venous thromboembolism     DVTs in 2004, 2005, 2007, 8/10, 8/11. IVC filter 2005. Off coumadin 04/2011 secondary to rectal bleeding  . 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 Chronic venous insufficiency   . DM2 (diabetes mellitus, type 2)   . GERD (gastroesophageal reflux disease)   . BPH (benign prostatic hypertrophy)   . Low back pain   . HTN (hypertension)   . HLD (hyperlipidemia)   . NICM (nonischemic cardiomyopathy) 1. 9/11  2. 2/12    1. Echo septal and apical akinesis, dilated LV, EF 45%, RV normal size  and systolic function, EF 43% on Myoview  2. Admitted with decompensated CHF, echo EF 30-35% with diffuse hypokinesis but akinesis of mid to apical anteroseptal wall and apex, moderate LVH, mild MR, grade I diastolic dysfunction, SPEP/UP negative and HIV negative. Most recent EF 10-15% 04/2011  . Smoking   . CAD (coronary artery disease) 1. 2007  2. 10/11    1. Left heart cath with 40-50% stenosis in small RCA EF 50%  2. Lexiscan myoview EF 43%, global hypokinesis, possible small area of apical ischemia; LHC no angiographic CAD, no LV-gram done due to CKD   2. No angiographic CAD by cath 02/2010  . CKD (chronic kidney disease) stage 2, GFR 60-89 ml/min as of 02/2011  . Headache   . Diabetes mellitus   . Blood transfusion   . Anemia     Current Outpatient Prescriptions  Medication Sig Dispense Refill  . atorvastatin (LIPITOR) 20 MG tablet Take 20 mg by mouth daily.        . digoxin (LANOXIN) 0.125 MG tablet Take 1 tablet (125 mcg total) by mouth daily.  30 tablet  6  . eplerenone (INSPRA) 25 MG tablet Take 1 tablet (25 mg total) by mouth daily.  30 tablet  6  . esomeprazole (NEXIUM) 40 MG capsule Take 1 capsule (40  mg total) by mouth daily before breakfast.      . glipiZIDE (GLUCOTROL XL) 5 MG 24 hr tablet Take 5 mg by mouth daily.        . hydrALAZINE (APRESOLINE) 25 MG tablet Take 1.5 tablets (37.5 mg total) by mouth 3 (three) times daily.  135 tablet  3  . insulin glargine (LANTUS) 100 UNIT/ML injection Inject 20 Units into the skin at bedtime.       . isosorbide mononitrate (IMDUR) 30 MG 24 hr tablet Take 1 tablet (30 mg total) by mouth daily.  30 tablet  6  . lansoprazole (PREVACID) 30 MG capsule Take 30 mg by mouth daily.        . metolazone (ZAROXOLYN) 2.5 MG tablet Every Monday  15 tablet  3  . metoprolol succinate (TOPROL-XL) 50 MG 24 hr tablet Take 1 tablet (50 mg total) by mouth daily. Take with or immediately following a meal.  30 tablet  6  . naphazoline (CLEAR EYES) 0.012 %  ophthalmic solution Place 2 drops into both eyes at bedtime as needed. For dry/itchy eyes       . Tamsulosin HCl (FLOMAX) 0.4 MG CAPS Take 1 capsule (0.4 mg total) by mouth at bedtime.  30 capsule  6  . torsemide (DEMADEX) 20 MG tablet Take 1 tablet (20 mg total) by mouth daily.  60 tablet  6     PHYSICAL EXAM: Filed Vitals:   09/02/11 1233  BP: 92/64  Pulse: 111  SpO2: 100%  Weight 164 reported by patient.   General: Visibly in pain but no resp distress HEENT: normal except for poor dentition Neck: supple. JVP 6-7 . Carotids 2+ bilaterally; no bruits. No lymphadenopathy or thryomegaly appreciated. Cor: PMI displaced laterally. Tachycardic, regular rhythm. soft s3 Lungs: clear Abdomen: soft, diffusely tender L side > R, mildly distended. . Extremities: no cyanosis, clubbing, rash, trace lower extremity edema 2+ Neuro: alert & orientedx3, cranial nerves grossly intact. Moves all 4 extremities w/o difficulty. Affect pleasant.    ASSESSMENT & PLAN:

## 2011-09-02 NOTE — Assessment & Plan Note (Addendum)
Acute onset of left sided abdominal pain.  Worse with movement and tender to palpation.  EKG without acute changes.  Volume status looks good.  Will transport to the ER for further work up including lab work and CT of the abdomen. Have discussed with ER.  Patient seen and examined with [name], NP. We discussed all aspects of the encounter. I agree with the assessment and plan as stated above.  Looks good from a HF perspective however he has had the acute onset of severe ab pain while in the waiting room. Belly is tender on exam but no rebound. Will transport to ER for further eval.

## 2011-09-02 NOTE — ED Notes (Signed)
Pt was sitting in the CHF clinic waiting room. Pt began to have sharp, pounding pain to the left side of his chest with SOB. Pt had normal ekg in office and brought here for further eval.

## 2011-09-02 NOTE — ED Provider Notes (Signed)
History     CSN: [id number]  Arrival date & time 09/02/11  1222   First MD Initiated Contact with Patient 09/02/11 1233      Chief Complaint  Patient presents with  . Chest Pain    (Consider location/radiation/quality/duration/timing/severity/associated sxs/prior treatment) HPI... patient was sitting in the clinic waiting room this morning. Had left-sided pounding chest pain for a brief time.  No shortness of breath, nausea, diaphoresis. Since the ED for further evaluation. Symptoms have abated dramatically. No radiation of pain  Past Medical History  Diagnosis Date  . Venous thromboembolism     DVTs in 2004, 2005, 2007, 8/10, 8/11. IVC filter 2005. Off coumadin 04/2011 secondary to rectal bleeding  . 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 Chronic venous insufficiency   . DM2 (diabetes mellitus, type 2)   . GERD (gastroesophageal reflux disease)   . BPH (benign prostatic hypertrophy)   . Low back pain   . HTN (hypertension)   . HLD (hyperlipidemia)   . NICM (nonischemic cardiomyopathy) 1. 9/11  2. 2/12    1. Echo septal and apical akinesis, dilated LV, EF 45%, RV normal size and systolic function, EF 43% on Myoview  2. Admitted with decompensated CHF, echo EF 30-35% with diffuse hypokinesis but akinesis of mid to apical anteroseptal wall and apex, moderate LVH, mild MR, grade I diastolic dysfunction, SPEP/UP negative and HIV negative. Most recent EF 10-15% 04/2011  . Smoking   . CAD (coronary artery disease) 1. 2007  2. 10/11    1. Left heart cath with 40-50% stenosis in small RCA EF 50%  2. Lexiscan myoview EF 43%, global hypokinesis, possible small area of apical ischemia; LHC no angiographic CAD, no LV-gram done due to CKD   2. No angiographic CAD by cath 02/2010  . CKD (chronic kidney disease) stage 2, GFR 60-89 ml/min as of 02/2011  . Headache   . Diabetes mellitus   . Blood transfusion   . Anemia     Past Surgical History  Procedure Date  .  Cardiac catheterization   . Appendectomy   . Cholecystectomy   . Colonoscopy 05/01/2011    Procedure: COLONOSCOPY;  Surgeon: Louis Meckel, MD;  Location: [facility] ENDOSCOPY;  Service: Endoscopy;  Laterality: N/A;    Family History  Problem Relation Age of Onset  . Coronary artery disease Neg Hx     Premature    History  Substance Use Topics  . Smoking status: Former Smoker -- 1.0 packs/day for 40 years    Types: Cigarettes    Quit date: 06/30/2011  . Smokeless tobacco: Never Used  . Alcohol Use: No      Review of Systems  All other systems reviewed and are negative.    Allergies  Review of patient's allergies indicates no known allergies.  Home Medications   Current Outpatient Rx  Name Route Sig Dispense Refill  . ATORVASTATIN CALCIUM 20 MG PO TABS Oral Take 20 mg by mouth daily.      Marland Kitchen DIGOXIN 0.125 MG PO TABS Oral Take 125 mcg by mouth daily.    . EPLERENONE 25 MG PO TABS Oral Take 25 mg by mouth daily.    Marland Kitchen ESOMEPRAZOLE MAGNESIUM 40 MG PO CPDR Oral Take 40 mg by mouth daily before breakfast.    . GLIPIZIDE ER 5 MG PO TB24 Oral Take 5 mg by mouth daily.      Marland Kitchen HYDRALAZINE HCL 25 MG PO TABS Oral  Take 37.5 mg by mouth 3 (three) times daily.    . INSULIN GLARGINE 100 UNIT/ML [place] SOLN Subcutaneous Inject 20 Units into the skin at bedtime.     . ISOSORBIDE MONONITRATE ER 30 MG PO TB24 Oral Take 30 mg by mouth daily.    Marland Kitchen LANSOPRAZOLE 30 MG PO CPDR Oral Take 30 mg by mouth daily.      Marland Kitchen METOLAZONE 2.5 MG PO TABS  Every Monday 15 tablet 3  . METOPROLOL SUCCINATE ER 50 MG PO TB24 Oral Take 50 mg by mouth daily. Take with or immediately following a meal.    . NAPHAZOLINE HCL 0.012 % OP SOLN Both Eyes Place 2 drops into both eyes at bedtime as needed. For dry/itchy eyes    . TAMSULOSIN HCL 0.4 MG PO CAPS Oral Take 0.4 mg by mouth at bedtime.    . TORSEMIDE 20 MG PO TABS Oral Take 20 mg by mouth daily.      BP 111/76  Temp(Src) 97.8 F (36.6 C) (Oral)  Resp 20  SpO2  100%  Physical Exam  Nursing note and vitals reviewed. Constitutional: He is oriented to person, place, and time. He appears well-developed and well-nourished.  HENT:  Head: Normocephalic and atraumatic.  Eyes: Conjunctivae and EOM are normal. Pupils are equal, round, and reactive to light.  Neck: Normal range of motion. Neck supple.  Cardiovascular: Normal rate and regular rhythm.   Pulmonary/Chest: Effort normal and breath sounds normal.  Abdominal: Soft. Bowel sounds are normal.  Musculoskeletal: Normal range of motion.  Neurological: He is alert and oriented to person, place, and time.  Skin: Skin is warm and dry.  Psychiatric: He has a normal mood and affect.    ED Course  Procedures (including critical care time)  Labs Reviewed  CBC - Abnormal; Notable for the following:    Hemoglobin 11.2 (*)    HCT 37.2 (*)    MCV 75.6 (*)    MCH 22.8 (*)    RDW 18.9 (*)    All other components within normal limits  BASIC METABOLIC PANEL - Abnormal; Notable for the following:    Glucose, Bld 177 (*)    BUN 31 (*)    Creatinine, Ser 1.93 (*)    GFR calc non Af Amer 35 (*)    GFR calc Af Amer 41 (*)    All other components within normal limits  PRO B NATRIURETIC PEPTIDE - Abnormal; Notable for the following:    Pro B Natriuretic peptide (BNP) 284.2 (*)    All other components within normal limits  POCT I-STAT, CHEM 8 - Abnormal; Notable for the following:    Potassium 3.4 (*)    BUN 33 (*)    Creatinine, Ser 2.00 (*)    Glucose, Bld 152 (*)    All other components within normal limits  AMYLASE - Abnormal; Notable for the following:    Amylase 221 (*)    All other components within normal limits  LIPASE, BLOOD - Abnormal; Notable for the following:    Lipase 106 (*)    All other components within normal limits  TROPONIN I  HEPATIC FUNCTION PANEL  POCT I-STAT TROPONIN I  Dg Chest 2 View  09/02/2011  *RADIOLOGY REPORT*  Clinical Data: Chest pain  CHEST - 2 VIEW  Comparison:  06/23/2011  Findings: Cardiomediastinal silhouette is stable.  No acute infiltrate or pleural effusion.  No pulmonary edema.  There is left basilar atelectasis or scarring. No pulmonary edema.  Mild degenerative changes thoracic spine.  IMPRESSION: No active disease.  Left basilar atelectasis or scarring.  Original Report Authenticated By: Natasha Mead, M.D.   Dg Chest Port 1 View  09/02/2011  *RADIOLOGY REPORT*  Clinical Data: Chest pain  PORTABLE CHEST - 1 VIEW  Comparison: 09/01/2011  Findings: Cardiac leads overlie the chest.  Heart size is upper limits of normal.  Lung volumes are low but clear.  No pleural effusion.  No acute osseous finding.  Left lower lobe scarring is stable.  IMPRESSION: Low volumes with stable left lower lobe scarring but no new focal abnormality.  Original Report Authenticated By: Harrel Lemon, M.D.   No results found.   No diagnosis found.   Date: 09/02/2011  Rate: 99  Rhythm: normal sinus rhythm  QRS Axis: normal  Intervals: normal  ST/T Wave abnormalities: normal  Conduction Disutrbances:none  Narrative Interpretation:   Old EKG Reviewed: changes noted   MDM  Patient is pain-free in ED. Will do simple screening tests secondary risk factor profile   Recheck at 1530:  Patient feels back to normal. No chest pain or shortness of breath.     Donnetta Hutching, MD 09/02/11 [phone]

## 2011-09-02 NOTE — ED Notes (Signed)
Pt d/c home in NAD. Pt voiced understanding of d/c instructions and follow-up care. Pt stated he has transportation arranged.

## 2011-09-06 ENCOUNTER — Encounter (HOSPITAL_COMMUNITY): Payer: Self-pay | Admitting: Emergency Medicine

## 2011-09-06 ENCOUNTER — Emergency Department (HOSPITAL_COMMUNITY): Payer: PRIVATE HEALTH INSURANCE

## 2011-09-06 ENCOUNTER — Emergency Department (HOSPITAL_COMMUNITY)
Admission: EM | Admit: 2011-09-06 | Discharge: 2011-09-06 | Disposition: A | Discharge status: Home / Self Care | Payer: PRIVATE HEALTH INSURANCE | Attending: Emergency Medicine | Admitting: Emergency Medicine

## 2011-09-06 DIAGNOSIS — I428 Other cardiomyopathies: Secondary | ICD-10-CM | POA: Insufficient documentation

## 2011-09-06 DIAGNOSIS — I872 Venous insufficiency (chronic) (peripheral): Secondary | ICD-10-CM | POA: Insufficient documentation

## 2011-09-06 DIAGNOSIS — I129 Hypertensive chronic kidney disease with stage 1 through stage 4 chronic kidney disease, or unspecified chronic kidney disease: Secondary | ICD-10-CM | POA: Insufficient documentation

## 2011-09-06 DIAGNOSIS — K219 Gastro-esophageal reflux disease without esophagitis: Secondary | ICD-10-CM | POA: Insufficient documentation

## 2011-09-06 DIAGNOSIS — M25569 Pain in unspecified knee: Secondary | ICD-10-CM | POA: Insufficient documentation

## 2011-09-06 DIAGNOSIS — Z87891 Personal history of nicotine dependence: Secondary | ICD-10-CM | POA: Insufficient documentation

## 2011-09-06 DIAGNOSIS — M25469 Effusion, unspecified knee: Secondary | ICD-10-CM | POA: Insufficient documentation

## 2011-09-06 DIAGNOSIS — E119 Type 2 diabetes mellitus without complications: Secondary | ICD-10-CM | POA: Insufficient documentation

## 2011-09-06 DIAGNOSIS — M25561 Pain in right knee: Secondary | ICD-10-CM

## 2011-09-06 DIAGNOSIS — Z86718 Personal history of other venous thrombosis and embolism: Secondary | ICD-10-CM | POA: Insufficient documentation

## 2011-09-06 DIAGNOSIS — I2510 Atherosclerotic heart disease of native coronary artery without angina pectoris: Secondary | ICD-10-CM | POA: Insufficient documentation

## 2011-09-06 DIAGNOSIS — Z794 Long term (current) use of insulin: Secondary | ICD-10-CM | POA: Insufficient documentation

## 2011-09-06 DIAGNOSIS — N182 Chronic kidney disease, stage 2 (mild): Secondary | ICD-10-CM | POA: Insufficient documentation

## 2011-09-06 DIAGNOSIS — E785 Hyperlipidemia, unspecified: Secondary | ICD-10-CM | POA: Insufficient documentation

## 2011-09-06 MED ORDER — OXYCODONE-ACETAMINOPHEN 5-325 MG PO TABS
1.0000 | ORAL_TABLET | Freq: Once | ORAL | Status: AC
  Administered 2011-09-06: 1 via ORAL
  Filled 2011-09-06: qty 1

## 2011-09-06 MED ORDER — OXYCODONE-ACETAMINOPHEN 5-325 MG PO TABS: 1.0000 | ORAL_TABLET | Freq: Four times a day (QID) | ORAL | Status: AC | PRN

## 2011-09-06 NOTE — Discharge Instructions (Signed)
Knee Pain The knee is the complex joint between your thigh and your lower leg. It is made up of bones, tendons, ligaments, and cartilage. The bones that make up the knee are:  The femur in the thigh.   The tibia and fibula in the lower leg.   The patella or kneecap riding in the groove on the lower femur.  CAUSES  Knee pain is a common complaint with many causes. A few of these causes are:  Injury, such as:   A ruptured ligament or tendon injury.   Torn cartilage.   Medical conditions, such as:   Gout   Arthritis   Infections   Overuse, over training or overdoing a physical activity.  Knee pain can be minor or severe. Knee pain can accompany debilitating injury. Minor knee problems often respond well to self-care measures or get well on their own. More serious injuries may need medical intervention or even surgery. SYMPTOMS The knee is complex. Symptoms of knee problems can vary widely. Some of the problems are:  Pain with movement and weight bearing.   Swelling and tenderness.   Buckling of the knee.   Inability to straighten or extend your knee.   Your knee locks and you cannot straighten it.   Warmth and redness with pain and fever.   Deformity or dislocation of the kneecap.  DIAGNOSIS  Determining what is wrong may be very straight forward such as when there is an injury. It can also be challenging because of the complexity of the knee. Tests to make a diagnosis may include:  Your caregiver taking a history and doing a physical exam.   Routine X-rays can be used to rule out other problems. X-rays will not reveal a cartilage tear. Some injuries of the knee can be diagnosed by:   Arthroscopy a surgical technique by which a small video camera is inserted through tiny incisions on the sides of the knee. This procedure is used to examine and repair internal knee joint problems. Tiny instruments can be used during arthroscopy to repair the torn knee cartilage  (meniscus).   Arthrography is a radiology technique. A contrast liquid is directly injected into the knee joint. Internal structures of the knee joint then become visible on X-ray film.   An MRI scan is a non x-ray radiology procedure in which magnetic fields and a computer produce two- or three-dimensional images of the inside of the knee. Cartilage tears are often visible using an MRI scanner. MRI scans have largely replaced arthrography in diagnosing cartilage tears of the knee.   Blood work.   Examination of the fluid that helps to lubricate the knee joint (synovial fluid). This is done by taking a sample out using a needle and a syringe.  TREATMENT The treatment of knee problems depends on the cause. Some of these treatments are:  Depending on the injury, proper casting, splinting, surgery or physical therapy care will be needed.   Give yourself adequate recovery time. Do not overuse your joints. If you begin to get sore during workout routines, back off. Slow down or do fewer repetitions.   For repetitive activities such as cycling or running, maintain your strength and nutrition.   Alternate muscle groups. For example if you are a weight lifter, work the upper body on one day and the lower body the next.   Either tight or weak muscles do not give the proper support for your knee. Tight or weak muscles do not absorb the stress placed   on the knee joint. Keep the muscles surrounding the knee strong.   Take care of mechanical problems.   If you have flat feet, orthotics or special shoes may help. See your caregiver if you need help.   Arch supports, sometimes with wedges on the inner or outer aspect of the heel, can help. These can shift pressure away from the side of the knee most bothered by osteoarthritis.   A brace called an "unloader" brace also may be used to help ease the pressure on the most arthritic side of the knee.   If your caregiver has prescribed crutches, braces,  wraps or ice, use as directed. The acronym for this is PRICE. This means protection, rest, ice, compression and elevation.   Nonsteroidal anti-inflammatory drugs (NSAID's), can help relieve pain. But if taken immediately after an injury, they may actually increase swelling. Take NSAID's with food in your stomach. Stop them if you develop stomach problems. Do not take these if you have a history of ulcers, stomach pain or bleeding from the bowel. Do not take without your caregiver's approval if you have problems with fluid retention, heart failure, or kidney problems.   For ongoing knee problems, physical therapy may be helpful.   Glucosamine and chondroitin are over-the-counter dietary supplements. Both may help relieve the pain of osteoarthritis in the knee. These medicines are different from the usual anti-inflammatory drugs. Glucosamine may decrease the rate of cartilage destruction.   Injections of a corticosteroid drug into your knee joint may help reduce the symptoms of an arthritis flare-up. They may provide pain relief that lasts a few months. You may have to wait a few months between injections. The injections do have a small increased risk of infection, water retention and elevated blood sugar levels.   Hyaluronic acid injected into damaged joints may ease pain and provide lubrication. These injections may work by reducing inflammation. A series of shots may give relief for as long as 6 months.   Topical painkillers. Applying certain ointments to your skin may help relieve the pain and stiffness of osteoarthritis. Ask your pharmacist for suggestions. Many over the-counter products are approved for temporary relief of arthritis pain.   In some countries, doctors often prescribe topical NSAID's for relief of chronic conditions such as arthritis and tendinitis. A review of treatment with NSAID creams found that they worked as well as oral medications but without the serious side effects.    PREVENTION  Maintain a healthy weight. Extra pounds put more strain on your joints.   Get strong, stay limber. Weak muscles are a common cause of knee injuries. Stretching is important. Include flexibility exercises in your workouts.   Be smart about exercise. If you have osteoarthritis, chronic knee pain or recurring injuries, you may need to change the way you exercise. This does not mean you have to stop being active. If your knees ache after jogging or playing basketball, consider switching to swimming, water aerobics or other low-impact activities, at least for a few days a week. Sometimes limiting high-impact activities will provide relief.   Make sure your shoes fit well. Choose footwear that is right for your sport.   Protect your knees. Use the proper gear for knee-sensitive activities. Use kneepads when playing volleyball or laying carpet. Buckle your seat belt every time you drive. Most shattered kneecaps occur in car accidents.   Rest when you are tired.  SEEK MEDICAL CARE IF:  You have knee pain that is continual and does not   seem to be getting better.  SEEK IMMEDIATE MEDICAL CARE IF:  Your knee joint feels hot to the touch and you have a high fever. MAKE SURE YOU:   Understand these instructions.   Will watch your condition.   Will get help right away if you are not doing well or get worse.  Document Released: 03/02/2007 Document Revised: 04/24/2011 Document Reviewed: 03/02/2007 [employer] Patient Information 2012 [employer]. 

## 2011-09-06 NOTE — ED Notes (Signed)
Pt. Arrived by EMS with Rt. Knee pain since yesterday, it happens when it rains.

## 2011-09-06 NOTE — ED Notes (Signed)
Pt out of room for testing. 

## 2011-09-06 NOTE — Progress Notes (Signed)
Orthopedic Tech Progress Note Patient Details:  DAT DERKSEN 09/16/1945 [id number]  Other Ortho Devices Type of Ortho Device: Knee Sleeve Ortho Device Location: (R) LE Ortho Device Interventions: Application   Jennye Moccasin 09/06/2011, 4:36 PM

## 2011-09-06 NOTE — ED Provider Notes (Signed)
History     CSN: [id number]  Arrival date & time 09/06/11  1411   First MD Initiated Contact with Patient 09/06/11 1449      Chief Complaint  Patient presents with  . Knee Pain    (Consider location/radiation/quality/duration/timing/severity/associated sxs/prior treatment) Patient is a 66 y.o. male presenting with knee pain. The history is provided by the patient.  Knee Pain This is a recurrent problem. The current episode started yesterday. The problem occurs constantly (Pain is an 8/10 and worse with bending and walking). The problem has not changed since onset.Associated symptoms comments: Fever, warmth, redness. The symptoms are aggravated by bending, walking and standing. The symptoms are relieved by nothing. He has tried acetaminophen for the symptoms. The treatment provided no relief.    Past Medical History  Diagnosis Date  . Venous thromboembolism     DVTs in 2004, 2005, 2007, 8/10, 8/11. IVC filter 2005. Off coumadin 04/2011 secondary to rectal bleeding  . 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 Chronic venous insufficiency   . DM2 (diabetes mellitus, type 2)   . GERD (gastroesophageal reflux disease)   . BPH (benign prostatic hypertrophy)   . Low back pain   . HTN (hypertension)   . HLD (hyperlipidemia)   . NICM (nonischemic cardiomyopathy) 1. 9/11  2. 2/12    1. Echo septal and apical akinesis, dilated LV, EF 45%, RV normal size and systolic function, EF 43% on Myoview  2. Admitted with decompensated CHF, echo EF 30-35% with diffuse hypokinesis but akinesis of mid to apical anteroseptal wall and apex, moderate LVH, mild MR, grade I diastolic dysfunction, SPEP/UP negative and HIV negative. Most recent EF 10-15% 04/2011  . Smoking   . CAD (coronary artery disease) 1. 2007  2. 10/11    1. Left heart cath with 40-50% stenosis in small RCA EF 50%  2. Lexiscan myoview EF 43%, global hypokinesis, possible small area of apical ischemia; LHC no  angiographic CAD, no LV-gram done due to CKD   2. No angiographic CAD by cath 02/2010  . CKD (chronic kidney disease) stage 2, GFR 60-89 ml/min as of 02/2011  . Headache   . Diabetes mellitus   . Blood transfusion   . Anemia     Past Surgical History  Procedure Date  . Cardiac catheterization   . Appendectomy   . Cholecystectomy   . Colonoscopy 05/01/2011    Procedure: COLONOSCOPY;  Surgeon: Louis Meckel, MD;  Location: [facility] ENDOSCOPY;  Service: Endoscopy;  Laterality: N/A;    Family History  Problem Relation Age of Onset  . Coronary artery disease Neg Hx     Premature    History  Substance Use Topics  . Smoking status: Former Smoker -- 1.0 packs/day for 40 years    Types: Cigarettes    Quit date: 06/30/2011  . Smokeless tobacco: Never Used  . Alcohol Use: No      Review of Systems  All other systems reviewed and are negative.    Allergies  Review of patient's allergies indicates no known allergies.  Home Medications   Current Outpatient Rx  Name Route Sig Dispense Refill  . ATORVASTATIN CALCIUM 20 MG PO TABS Oral Take 20 mg by mouth daily.      Marland Kitchen DIGOXIN 0.125 MG PO TABS Oral Take 125 mcg by mouth daily.    . EPLERENONE 25 MG PO TABS Oral Take 25 mg by mouth daily.    Marland Kitchen ESOMEPRAZOLE  MAGNESIUM 40 MG PO CPDR Oral Take 40 mg by mouth daily before breakfast.    . GLIPIZIDE ER 5 MG PO TB24 Oral Take 5 mg by mouth daily.      Marland Kitchen HYDRALAZINE HCL 25 MG PO TABS Oral Take 37.5 mg by mouth 3 (three) times daily.    . INSULIN GLARGINE 100 UNIT/ML [place] SOLN Subcutaneous Inject 20 Units into the skin at bedtime.     . ISOSORBIDE MONONITRATE ER 30 MG PO TB24 Oral Take 30 mg by mouth daily.    Marland Kitchen LANSOPRAZOLE 30 MG PO CPDR Oral Take 30 mg by mouth daily.      Marland Kitchen METOLAZONE 2.5 MG PO TABS  Every Monday 15 tablet 3  . METOPROLOL SUCCINATE ER 50 MG PO TB24 Oral Take 50 mg by mouth daily. Take with or immediately following a meal.    . NAPHAZOLINE HCL 0.012 % OP SOLN Both Eyes Place  2 drops into both eyes at bedtime as needed. For dry/itchy eyes    . TAMSULOSIN HCL 0.4 MG PO CAPS Oral Take 0.4 mg by mouth at bedtime.    . TORSEMIDE 20 MG PO TABS Oral Take 20 mg by mouth daily.      BP 140/87  Pulse 91  Temp(Src) 97.7 F (36.5 C) (Oral)  Resp 22  SpO2 97%  Physical Exam  Nursing note and vitals reviewed. Constitutional: He is oriented to person, place, and time. He appears well-developed and well-nourished. No distress.  HENT:  Head: Normocephalic and atraumatic.  Mouth/Throat: Oropharynx is clear and moist.  Eyes: Conjunctivae and EOM are normal. Pupils are equal, round, and reactive to light.  Musculoskeletal: Normal range of motion. He exhibits no edema.       Right knee: He exhibits effusion. He exhibits normal range of motion, no ecchymosis, no deformity, no erythema, normal alignment and no bony tenderness. tenderness found. Medial joint line and lateral joint line tenderness noted.  Neurological: He is alert and oriented to person, place, and time.  Skin: Skin is warm and dry. No rash noted. No erythema.  Psychiatric: He has a normal mood and affect. His behavior is normal.    ED Course  Procedures (including critical care time)  Labs Reviewed - No data to display Dg Knee Complete 4 Views Right  09/06/2011  *RADIOLOGY REPORT*  Clinical Data: Knee pain and soreness.  No known injury.  RIGHT KNEE - COMPLETE 4+ VIEW  Comparison: None.  Findings: No acute bony or joint abnormality is identified.  There is no joint effusion.  Mild enthesopathic change at the quadriceps tendon insertion is noted.  IMPRESSION: Negative exam.  Original Report Authenticated By: Bernadene Bell. Maricela Curet, M.D.     No diagnosis found.    MDM  Patient with right knee pain that started yesterday after range. He states he's had this intermittent pain for months typically starts after a weather change. States it hurts worse with bending and walking. There is mild swelling on exam full  range of motion of the knee and no erythema or warmth. Low concern for septic joint or infectious etiology. No signs suggestive of gout. Appears most likely arthritis with a small effusion causing his pain. Plain film of the knee pending and patient given pain medication.  4:26 PM Pain improved after pain medication. X-ray is unrevealing. Will give patient followup.      Gwyneth Sprout, MD 09/06/11 1626

## 2011-09-08 ENCOUNTER — Encounter: Payer: Self-pay | Admitting: Internal Medicine

## 2011-09-14 NOTE — Assessment & Plan Note (Signed)
Volume status looks good. Continue current regimen.  

## 2011-09-15 ENCOUNTER — Other Ambulatory Visit (HOSPITAL_COMMUNITY): Payer: Self-pay | Admitting: *Deleted

## 2011-09-15 MED ORDER — TAMSULOSIN HCL 0.4 MG PO CAPS: 0.4000 mg | ORAL_CAPSULE | Freq: Every day | ORAL | Status: DC

## 2011-09-17 ENCOUNTER — Telehealth (HOSPITAL_COMMUNITY): Payer: Self-pay | Admitting: *Deleted

## 2011-09-17 NOTE — Telephone Encounter (Signed)
Spoke w/Teri gave VO for digoxin, she states they also have Lisinopril and Potassium on his med list he has been getting advised we did not have him on those meds, spoke w/pt he doesn't think he is on those meds, he will bring all medications with him to his f/u appt 5/16

## 2011-09-17 NOTE — Telephone Encounter (Signed)
[name] from [facility] called for clarification on [name] medications. She would like a call back. Thanks.

## 2011-09-19 ENCOUNTER — Encounter (HOSPITAL_COMMUNITY): Payer: Self-pay | Admitting: Emergency Medicine

## 2011-09-19 ENCOUNTER — Other Ambulatory Visit: Payer: Self-pay

## 2011-09-19 ENCOUNTER — Inpatient Hospital Stay (HOSPITAL_COMMUNITY)
Admission: EM | Admit: 2011-09-19 | Discharge: 2011-09-21 | DRG: 312 | Disposition: A | Discharge status: Home Health | Payer: PRIVATE HEALTH INSURANCE | Attending: Internal Medicine | Admitting: Internal Medicine

## 2011-09-19 ENCOUNTER — Emergency Department (HOSPITAL_COMMUNITY): Payer: PRIVATE HEALTH INSURANCE

## 2011-09-19 DIAGNOSIS — M545 Low back pain, unspecified: Secondary | ICD-10-CM | POA: Diagnosis present

## 2011-09-19 DIAGNOSIS — Y92009 Unspecified place in unspecified non-institutional (private) residence as the place of occurrence of the external cause: Secondary | ICD-10-CM

## 2011-09-19 DIAGNOSIS — Z79899 Other long term (current) drug therapy: Secondary | ICD-10-CM

## 2011-09-19 DIAGNOSIS — I2510 Atherosclerotic heart disease of native coronary artery without angina pectoris: Secondary | ICD-10-CM | POA: Diagnosis present

## 2011-09-19 DIAGNOSIS — S82899A Other fracture of unspecified lower leg, initial encounter for closed fracture: Secondary | ICD-10-CM | POA: Diagnosis present

## 2011-09-19 DIAGNOSIS — Z86711 Personal history of pulmonary embolism: Secondary | ICD-10-CM

## 2011-09-19 DIAGNOSIS — I10 Essential (primary) hypertension: Secondary | ICD-10-CM | POA: Diagnosis present

## 2011-09-19 DIAGNOSIS — N179 Acute kidney failure, unspecified: Secondary | ICD-10-CM

## 2011-09-19 DIAGNOSIS — I129 Hypertensive chronic kidney disease with stage 1 through stage 4 chronic kidney disease, or unspecified chronic kidney disease: Secondary | ICD-10-CM | POA: Diagnosis present

## 2011-09-19 DIAGNOSIS — Y9301 Activity, walking, marching and hiking: Secondary | ICD-10-CM

## 2011-09-19 DIAGNOSIS — S82409A Unspecified fracture of shaft of unspecified fibula, initial encounter for closed fracture: Secondary | ICD-10-CM

## 2011-09-19 DIAGNOSIS — N400 Enlarged prostate without lower urinary tract symptoms: Secondary | ICD-10-CM | POA: Diagnosis present

## 2011-09-19 DIAGNOSIS — K219 Gastro-esophageal reflux disease without esophagitis: Secondary | ICD-10-CM | POA: Diagnosis present

## 2011-09-19 DIAGNOSIS — E785 Hyperlipidemia, unspecified: Secondary | ICD-10-CM | POA: Diagnosis present

## 2011-09-19 DIAGNOSIS — W19XXXA Unspecified fall, initial encounter: Secondary | ICD-10-CM | POA: Diagnosis present

## 2011-09-19 DIAGNOSIS — S82401A Unspecified fracture of shaft of right fibula, initial encounter for closed fracture: Secondary | ICD-10-CM | POA: Insufficient documentation

## 2011-09-19 DIAGNOSIS — Z86718 Personal history of other venous thrombosis and embolism: Secondary | ICD-10-CM

## 2011-09-19 DIAGNOSIS — N182 Chronic kidney disease, stage 2 (mild): Secondary | ICD-10-CM | POA: Diagnosis present

## 2011-09-19 DIAGNOSIS — R55 Syncope and collapse: Secondary | ICD-10-CM

## 2011-09-19 DIAGNOSIS — Z794 Long term (current) use of insulin: Secondary | ICD-10-CM

## 2011-09-19 DIAGNOSIS — I509 Heart failure, unspecified: Secondary | ICD-10-CM | POA: Diagnosis present

## 2011-09-19 DIAGNOSIS — I428 Other cardiomyopathies: Secondary | ICD-10-CM | POA: Diagnosis present

## 2011-09-19 DIAGNOSIS — E119 Type 2 diabetes mellitus without complications: Secondary | ICD-10-CM | POA: Diagnosis present

## 2011-09-19 DIAGNOSIS — N189 Chronic kidney disease, unspecified: Secondary | ICD-10-CM

## 2011-09-19 DIAGNOSIS — I5022 Chronic systolic (congestive) heart failure: Secondary | ICD-10-CM | POA: Diagnosis present

## 2011-09-19 DIAGNOSIS — I872 Venous insufficiency (chronic) (peripheral): Secondary | ICD-10-CM | POA: Diagnosis present

## 2011-09-19 DIAGNOSIS — E871 Hypo-osmolality and hyponatremia: Secondary | ICD-10-CM | POA: Diagnosis present

## 2011-09-19 DIAGNOSIS — I951 Orthostatic hypotension: Principal | ICD-10-CM | POA: Diagnosis present

## 2011-09-19 DIAGNOSIS — D649 Anemia, unspecified: Secondary | ICD-10-CM | POA: Diagnosis present

## 2011-09-19 DIAGNOSIS — F17200 Nicotine dependence, unspecified, uncomplicated: Secondary | ICD-10-CM | POA: Diagnosis present

## 2011-09-19 DIAGNOSIS — K31819 Angiodysplasia of stomach and duodenum without bleeding: Secondary | ICD-10-CM

## 2011-09-19 HISTORY — DX: Syncope and collapse: R55

## 2011-09-19 HISTORY — DX: Unspecified fracture of shaft of right fibula, initial encounter for closed fracture: S82.401A

## 2011-09-19 LAB — BASIC METABOLIC PANEL
BUN: 69 mg/dL — ABNORMAL HIGH (ref 6–23)
Chloride: 89 mEq/L — ABNORMAL LOW (ref 96–112)
Creatinine, Ser: 3.47 mg/dL — ABNORMAL HIGH (ref 0.50–1.35)
GFR calc Af Amer: 20 mL/min — ABNORMAL LOW (ref >90)
GFR calc non Af Amer: 17 mL/min — ABNORMAL LOW (ref >90)
Glucose, Bld: 176 mg/dL — ABNORMAL HIGH (ref 70–99)
Potassium: 4.6 mEq/L (ref 3.5–5.1)

## 2011-09-19 LAB — CBC
HCT: 39.1 % (ref 39.0–52.0)
Hemoglobin: 12.2 g/dL — ABNORMAL LOW (ref 13.0–17.0)
MCH: 23.8 pg — ABNORMAL LOW (ref 26.0–34.0)
MCHC: 31.2 g/dL (ref 30.0–36.0)
RBC: 5.13 MIL/uL (ref 4.22–5.81)

## 2011-09-19 LAB — DIFFERENTIAL
Basophils Relative: 1 % (ref 0–1)
Eosinophils Absolute: 0.1 10*3/uL (ref 0.0–0.7)
Lymphs Abs: 0.6 10*3/uL — ABNORMAL LOW (ref 0.7–4.0)
Monocytes Absolute: 0.4 10*3/uL (ref 0.1–1.0)
Monocytes Relative: 6 % (ref 3–12)

## 2011-09-19 MED ORDER — TAMSULOSIN HCL 0.4 MG PO CAPS
0.4000 mg | ORAL_CAPSULE | Freq: Every day | ORAL | Status: DC
  Administered 2011-09-19 – 2011-09-20 (×2): 0.4 mg via ORAL
  Filled 2011-09-19 (×3): qty 1

## 2011-09-19 MED ORDER — SODIUM CHLORIDE 0.9 % IV SOLN
250.0000 mL | INTRAVENOUS | Status: DC | PRN
Start: 2011-09-19 — End: 2011-09-21

## 2011-09-19 MED ORDER — ZOLPIDEM TARTRATE 5 MG PO TABS: 5.0000 mg | ORAL_TABLET | Freq: Every evening | ORAL | Status: DC | PRN

## 2011-09-19 MED ORDER — DIGOXIN 125 MCG PO TABS
125.0000 ug | ORAL_TABLET | Freq: Every day | ORAL | Status: DC
  Filled 2011-09-19: qty 1

## 2011-09-19 MED ORDER — ACETAMINOPHEN 325 MG PO TABS
650.0000 mg | ORAL_TABLET | ORAL | Status: DC | PRN
  Administered 2011-09-20: 650 mg via ORAL
  Filled 2011-09-19: qty 2

## 2011-09-19 MED ORDER — NAPHAZOLINE HCL 0.012 % OP SOLN: 2.0000 [drp] | Freq: Every evening | OPHTHALMIC | Status: DC | PRN

## 2011-09-19 MED ORDER — INSULIN GLARGINE 100 UNIT/ML ~~LOC~~ SOLN
20.0000 [IU] | Freq: Every day | SUBCUTANEOUS | Status: DC
  Administered 2011-09-19 – 2011-09-20 (×2): 20 [IU] via SUBCUTANEOUS

## 2011-09-19 MED ORDER — SIMVASTATIN 40 MG PO TABS
40.0000 mg | ORAL_TABLET | Freq: Every day | ORAL | Status: DC
  Administered 2011-09-20: 40 mg via ORAL
  Filled 2011-09-19 (×2): qty 1

## 2011-09-19 MED ORDER — MORPHINE SULFATE 4 MG/ML IJ SOLN
4.0000 mg | Freq: Once | INTRAMUSCULAR | Status: AC
  Administered 2011-09-19: 4 mg via INTRAVENOUS
  Filled 2011-09-19: qty 1

## 2011-09-19 MED ORDER — ALPRAZOLAM 0.25 MG PO TABS
0.2500 mg | ORAL_TABLET | Freq: Two times a day (BID) | ORAL | Status: DC | PRN
  Administered 2011-09-20: 0.25 mg via ORAL
  Filled 2011-09-19: qty 1

## 2011-09-19 MED ORDER — TETRAHYDROZOLINE HCL 0.05 % OP SOLN
2.0000 [drp] | Freq: Every evening | OPHTHALMIC | Status: DC | PRN
  Filled 2011-09-19: qty 15

## 2011-09-19 MED ORDER — METOLAZONE 2.5 MG PO TABS
2.5000 mg | ORAL_TABLET | ORAL | Status: DC
  Administered 2011-09-19: 2.5 mg via ORAL
  Filled 2011-09-19 (×2): qty 1

## 2011-09-19 MED ORDER — SODIUM CHLORIDE 0.9 % IJ SOLN
3.0000 mL | Freq: Two times a day (BID) | INTRAMUSCULAR | Status: DC
  Administered 2011-09-20 (×2): 3 mL via INTRAVENOUS

## 2011-09-19 MED ORDER — ONDANSETRON HCL 4 MG/2ML IJ SOLN: 4.0000 mg | Freq: Four times a day (QID) | INTRAMUSCULAR | Status: DC | PRN

## 2011-09-19 MED ORDER — TORSEMIDE 20 MG PO TABS
20.0000 mg | ORAL_TABLET | Freq: Every day | ORAL | Status: DC
  Filled 2011-09-19: qty 1

## 2011-09-19 MED ORDER — GLIPIZIDE ER 5 MG PO TB24
5.0000 mg | ORAL_TABLET | Freq: Every day | ORAL | Status: DC
  Administered 2011-09-20: 5 mg via ORAL
  Filled 2011-09-19 (×3): qty 1

## 2011-09-19 MED ORDER — PANTOPRAZOLE SODIUM 40 MG PO TBEC
80.0000 mg | DELAYED_RELEASE_TABLET | Freq: Every day | ORAL | Status: DC
  Administered 2011-09-20: 80 mg via ORAL
  Filled 2011-09-19: qty 2
  Filled 2011-09-19 (×2): qty 1

## 2011-09-19 MED ORDER — SODIUM CHLORIDE 0.9 % IJ SOLN: 3.0000 mL | INTRAMUSCULAR | Status: DC | PRN

## 2011-09-19 MED ORDER — ENOXAPARIN SODIUM 30 MG/0.3ML ~~LOC~~ SOLN
30.0000 mg | SUBCUTANEOUS | Status: DC
  Administered 2011-09-19 – 2011-09-20 (×2): 30 mg via SUBCUTANEOUS
  Filled 2011-09-19 (×3): qty 0.3

## 2011-09-19 MED ORDER — SODIUM CHLORIDE 0.9 % IV SOLN
INTRAVENOUS | Status: AC
  Administered 2011-09-20 (×2): via INTRAVENOUS

## 2011-09-19 MED ORDER — MORPHINE SULFATE 4 MG/ML IJ SOLN
4.0000 mg | INTRAMUSCULAR | Status: DC | PRN
  Administered 2011-09-19 – 2011-09-20 (×5): 4 mg via INTRAVENOUS
  Filled 2011-09-19 (×5): qty 1

## 2011-09-19 MED ORDER — SPIRONOLACTONE 25 MG PO TABS
25.0000 mg | ORAL_TABLET | Freq: Every day | ORAL | Status: DC
  Filled 2011-09-19: qty 1

## 2011-09-19 MED ORDER — METOPROLOL SUCCINATE ER 50 MG PO TB24
50.0000 mg | ORAL_TABLET | Freq: Every day | ORAL | Status: DC
  Administered 2011-09-20: 50 mg via ORAL
  Filled 2011-09-19 (×2): qty 1

## 2011-09-19 NOTE — ED Notes (Signed)
CBG:[id number] 

## 2011-09-19 NOTE — ED Notes (Signed)
RLE CAM walker in  Place.  RDP pulse 2+.  Pt c/o cam walker too tight.  Repositioned velcro straps for comfort,  Cardiac monitor SR rate 92

## 2011-09-19 NOTE — H&P (Signed)
History and Physical   Patient ID: MUNACHIMSO PALIN MRN: [id number], DOB/AGE: 02/04/1946   Admit date: 09/19/2011 Date of Consult: 09/19/2011   Primary Physician: Karie Chimera, MD, MD Primary Cardiologist: Nicholes Mango, MD (HF)  Pt. Profile: Mr. Palma is a 66 year old male with a complicated PMHx including with chronic systolic CHF secondary to NICM echo EF 10-15% by echo 04/2011 (previously 30-35% 06/2010), HTN, anemia, chronic renal insufficiency Cr 1.31, UGI bleed, duodenal AVMs, rectal bleeding 04/2011, DVTs (2004, 2005, 2007, 2010, 2011) however coumadin on hold due to GI bleed, IVC filter 2005, GERD, BPD, and chronic low back who presents to [facility] ED with syncope and resultant fibula fracture.    Problem List: Past Medical History  Diagnosis Date  . Venous thromboembolism     DVTs in 2004, 2005, 2007, 8/10, 8/11. IVC filter 2005. Off coumadin 04/2011 secondary to rectal bleeding  . 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 Chronic venous insufficiency   . DM2 (diabetes mellitus, type 2)   . GERD (gastroesophageal reflux disease)   . BPH (benign prostatic hypertrophy)   . Low back pain   . HTN (hypertension)   . HLD (hyperlipidemia)   . NICM (nonischemic cardiomyopathy) 1. 9/11  2. 2/12    1. Echo septal and apical akinesis, dilated LV, EF 45%, RV normal size and systolic function, EF 43% on Myoview  2. Admitted with decompensated CHF, echo EF 30-35% with diffuse hypokinesis but akinesis of mid to apical anteroseptal wall and apex, moderate LVH, mild MR, grade I diastolic dysfunction, SPEP/UP negative and HIV negative. Most recent EF 10-15% 04/2011  . Smoking   . CAD (coronary artery disease) 1. 2007  2. 10/11    1. Left heart cath with 40-50% stenosis in small RCA EF 50%  2. Lexiscan myoview EF 43%, global hypokinesis, possible small area of apical ischemia; LHC no angiographic CAD, no LV-gram done due to CKD   2. No angiographic CAD by cath 02/2010  . CKD  (chronic kidney disease) stage 2, GFR 60-89 ml/min as of 02/2011  . Headache   . Diabetes mellitus   . Blood transfusion   . Anemia     Past Surgical History  Procedure Date  . Cardiac catheterization   . Appendectomy   . Cholecystectomy   . Colonoscopy 05/01/2011    Procedure: COLONOSCOPY;  Surgeon: Louis Meckel, MD;  Location: [facility] ENDOSCOPY;  Service: Endoscopy;  Laterality: N/A;     Allergies: No Known Allergies  HPI:   Admitted for HF with massive volume overload in 2/13. Treated with milrinone. Diuresed well. Cr down to ~1.2-1.3. RHC after diuresis   RA = 4  RV = 32/6 (7)  PA = 45/15 (24)  PCW = 7  Fick cardiac output/index = 5.2/2.7  Thermo = 4.0/2.1  PVR = 3.4 [place]  FA sat = 94%  PA sat = 56%, 57%  Noninvasive BP : 125/92 (72)  Calculated SVR = 1040  CPX test 08/13/11  Peak VO2: 18.6 ml/kg/min predicted peak VO2: 61.4% VE/VCO2 slope: 42.8 OUES: 1.26 Peak RER: 1.16  The patient was recently seen in the heart failure clinic on 09/02/11. At that time, he was noted to be fairly euvolemic on multiple heart failure meds- digoxin, eplerenone, hydralazine/imdur, metolazone, toprol-XL and demadex.   He states that his fluid has been well-controlled. His weight this morning was 162 lbs. He states his BP has been fairly low recently. He  reports a BP of 70/60 mmHg. He reports standing up to walk outside to the car. He immediately felt lightheaded. He remembers opening a door, "everything went black" and he woke up on the ground with severe R leg pain a few seconds later. He does reports tachy-palpitations described as "heart racing" prior to standing and ambulating. He denies chest pain, shortness of breath, diaphoresis, n/v/d, urinary or bowel changes. He had eaten that morning. He denies tongue biting, losing control of bowel or urine function or involuntary movemnts. He was wearing loose fitted clothing.   In the ED, EKG reveals NSR without evidence of ischemia. He was  noted to be hyponatremic at 130 with an acute on CKD (BUN/Cr at 69/3.47; this has progressively worsened over the past few months, highest today). Radiographs of the R ankle reveal nondisplaced oblique right fibular fracture with osseous demineralization.   Home Medications: Prior to Admission medications   Medication Sig Start Date End Date Taking? Authorizing Provider  atorvastatin (LIPITOR) 20 MG tablet Take 20 mg by mouth every morning.    Yes Historical Provider, MD  digoxin (LANOXIN) 0.125 MG tablet Take 125 mcg by mouth daily.   Yes Amy D Clegg, NP  eplerenone (INSPRA) 25 MG tablet Take 25 mg by mouth daily.   Yes Amy D Clegg, NP  esomeprazole (NEXIUM) 40 MG capsule Take 40 mg by mouth daily before breakfast.   Yes Dolores Patty, MD  glipiZIDE (GLUCOTROL XL) 5 MG 24 hr tablet Take 5 mg by mouth daily.     Yes Historical Provider, MD  hydrALAZINE (APRESOLINE) 25 MG tablet Take 37.5 mg by mouth 3 (three) times daily.   Yes Dolores Patty, MD  insulin glargine (LANTUS) 100 UNIT/ML injection Inject 20 Units into the skin at bedtime.    Yes Historical Provider, MD  isosorbide mononitrate (IMDUR) 30 MG 24 hr tablet Take 30 mg by mouth daily.   Yes Amy D Clegg, NP  lansoprazole (PREVACID) 30 MG capsule Take 30 mg by mouth daily.     Yes Historical Provider, MD  metolazone (ZAROXOLYN) 2.5 MG tablet Take 2.5 mg by mouth every 7 (seven) days. Every Monday 08/18/11  Yes Amy D Clegg, NP  metoprolol succinate (TOPROL-XL) 50 MG 24 hr tablet Take 50 mg by mouth daily. Take with or immediately following a meal.   Yes Amy D Clegg, NP  naphazoline (CLEAR EYES) 0.012 % ophthalmic solution Place 2 drops into both eyes at bedtime as needed. For dry/itchy eyes   Yes Historical Provider, MD  Tamsulosin HCl (FLOMAX) 0.4 MG CAPS Take 1 capsule (0.4 mg total) by mouth at bedtime. 09/15/11  Yes Dolores Patty, MD  torsemide (DEMADEX) 20 MG tablet Take 20 mg by mouth daily.   Yes Amy Georgie Chard, NP     Inpatient Medications:     .  morphine injection  4 mg Intravenous Once    (Not in a hospital admission)  Family History  Problem Relation Age of Onset  . Coronary artery disease Neg Hx     Premature     History   Social History  . Marital Status: Divorced    Spouse Name: N/A    Number of Children: 1  . Years of Education: N/A   Occupational History  . security guard     Part time   Social History Main Topics  . Smoking status: Former Smoker -- 1.0 packs/day for 40 years    Types: Cigarettes  Quit date: 06/30/2011  . Smokeless tobacco: Never Used  . Alcohol Use: No  . Drug Use: No  . Sexually Active: Not on file   Other Topics Concern  . Not on file   Social History Narrative   Has 1 son.     Review of Systems: General: negative for chills, fever, night sweats or weight changes.  Cardiovascular: positive for palpitations, lightheadedness and syncope, negative for chest pain, dyspnea on exertion, edema, orthopnea, paroxysmal nocturnal dyspnea or shortness of breath Dermatological: negative for rash Respiratory: negative for cough or wheezing Urologic: negative for hematuria Abdominal: negative for nausea, vomiting, diarrhea, bright red blood per rectum, melena, or hematemesis Neurologic:  No involuntary movements, incontinence or tongue biting All other systems reviewed and are otherwise negative except as noted above.  Physical Exam: Blood pressure 113/69, pulse 91, temperature 97.7 F (36.5 C), temperature source Oral, resp. rate 18, SpO2 100.00%.  General: Appears older than stated age, in no acute distress. Head: Normocephalic, atraumatic, sclera non-icteric, no xanthomas, nares are without discharge.  Neck: Negative for carotid bruits. JVD not elevated. Lungs: Shallow respirations, unlabored. Clear bilaterally to auscultation without wheezes, rales, or rhonchi Heart: RRR faint systolic and diastolic murmurs noted at LUSB, with S1 S2. No murmurs,  rubs, or gallops appreciated. Abdomen: Soft, non-tender, non-distended with normoactive bowel sounds. No hepatomegaly. No rebound/guarding. No obvious abdominal masses. Msk:  Strength and tone appears normal for age. Extremities: RLE casted, no gross abnormalities noted on inspection, not otherwise assessed. LLE- No clubbing, cyanosis or edema.  Distal pedal pulses are 2+ in LLE. Neuro: Alert and oriented X 3. Moves all extremities spontaneously. Psych:  Responds to questions appropriately with a normal affect.  Labs: Recent Labs  Basename 09/19/11 1431   WBC 6.6   HGB 12.2*   HCT 39.1   MCV 76.2*   PLT 273   Lab 09/19/11 1519  NA 130*  K 4.6  CL 89*  CO2 28  BUN 69*  CREATININE 3.47*  CALCIUM 9.5  PROT --  BILITOT --  ALKPHOS --  ALT --  AST --  AMYLASE --  LIPASE --  GLUCOSE 176*   Radiology/Studies: Dg Chest 2 View  09/02/2011  *RADIOLOGY REPORT*  Clinical Data: Chest pain  CHEST - 2 VIEW  Comparison: 06/23/2011  Findings: Cardiomediastinal silhouette is stable.  No acute infiltrate or pleural effusion.  No pulmonary edema.  There is left basilar atelectasis or scarring. No pulmonary edema.  Mild degenerative changes thoracic spine.  IMPRESSION: No active disease.  Left basilar atelectasis or scarring.  Original Report Authenticated By: Natasha Mead, M.D.   Dg Ankle 2 Views Left  09/19/2011  *RADIOLOGY REPORT*  Clinical Data: Ankle pain post fall  LEFT ANKLE - 2 VIEW  Comparison: None  Findings: Osseous demineralization. Ankle mortise intact. Subtle oblique nondisplaced distal right fibular fracture. No additional fracture or dislocation seen. Plantar calcaneal spur.  IMPRESSION: Nondisplaced oblique distal right fibular fracture. Osseous demineralization.  Original Report Authenticated By: Lollie Marrow, M.D.   Dg Chest Port 1 View  09/02/2011  *RADIOLOGY REPORT*  Clinical Data: Chest pain  PORTABLE CHEST - 1 VIEW  Comparison: 09/01/2011  Findings: Cardiac leads overlie the  chest.  Heart size is upper limits of normal.  Lung volumes are low but clear.  No pleural effusion.  No acute osseous finding.  Left lower lobe scarring is stable.  IMPRESSION: Low volumes with stable left lower lobe scarring but no new focal abnormality.  Original  Report Authenticated By: Harrel Lemon, M.D.   Dg Knee Complete 4 Views Right  09/06/2011  *RADIOLOGY REPORT*  Clinical Data: Knee pain and soreness.  No known injury.  RIGHT KNEE - COMPLETE 4+ VIEW  Comparison: None.  Findings: No acute bony or joint abnormality is identified.  There is no joint effusion.  Mild enthesopathic change at the quadriceps tendon insertion is noted.  IMPRESSION: Negative exam.  Original Report Authenticated By: Bernadene Bell. D'ALESSIO, M.D.    EKG: NSR, 89 BPM, 1st degree AVB  ASSESSMENT 1. Syncope likely orthostatic with R fibula fracture 2. Chronic systolic HF 3. NICM EF 10-15% 4. A/C Renal failure  5. H/o multiple DVTs/PE with  IVC filter     --not on coumadin due to recurrent GI bleeds due to AVMs 6. DM2  Plan/Discussion:  Attending: Patient seen and examined with Neal Dy, PA-C. We discussed all aspects of the encounter. I agree with above. I suspect Mr. Linhardt' syncope is orthostatic in nature but also cannot exclude tachyarrhythmia. We will admit him for gentle hydration and monitoring on tele. He already has a splint for his fibula fracture.  I am more concerned about the bigger picture. His renal function is much worse despite the fact that he really isn't that far below his baseline weight and I worry he has a component of low output HF. If his renal function does not improve significantly with IVF and holding his hydralazine, I would consider repeat RHC and have low threshold to start milrinone to preserve his renal function and permit further consideration of advanced therapies. I explained to him that if we were to consider a VAD he would need to go back on coumadin and make sure he does  not have further GI bleeding before a device could be considered. He is willing to try it again. I will need to review his h/o bleeding and endoscopies more closely.   We will follow him closely. Please keep in house over the weekend.   Ahsley Attwood,MD 8:21 PM

## 2011-09-19 NOTE — ED Notes (Signed)
Pt states was going out w/ family today and passed out and hit thefloor and hurt rt ankle. States yesterday  His nurse  Told him his bp was low he does not know what happened today except he passed out and hurt his ankle

## 2011-09-19 NOTE — ED Notes (Signed)
Patient given [place] sandwich, cheese stick, cookies, and sprite zero.

## 2011-09-19 NOTE — ED Notes (Signed)
Pt to and from x-ray

## 2011-09-19 NOTE — ED Notes (Signed)
Orthostatics done only while lying and sitting. Patient states unable to stand due to ankle pain.

## 2011-09-19 NOTE — Progress Notes (Signed)
Orthopedic Tech Progress Note Patient Details:  [name] 14-Dec-1945 [id number]  Other Ortho Devices Type of Ortho Device: CAM walker Ortho Device Location: right foot Ortho Device Interventions: Application   [name] 09/19/2011, 7:04 PM

## 2011-09-19 NOTE — ED Notes (Signed)
Pt resting with call light in reach. Denies further needs at this time

## 2011-09-19 NOTE — ED Provider Notes (Signed)
Patient presents with pain to his right ankle after he had a syncopal episode.  Patient has chronic heart failure or anticipation of a heart failure clinic.  Plan is for pertinent x-rays and labs in move to the exam room.  Nelia Shi, MD 09/19/11 [phone]

## 2011-09-19 NOTE — ED Provider Notes (Signed)
History     CSN: [id number]  Arrival date & time 09/19/11  1204   First MD Initiated Contact with Patient 09/19/11 1550      Chief Complaint  Patient presents with  . Ankle Pain    (Consider location/radiation/quality/duration/timing/severity/associated sxs/prior treatment) Patient is a 66 y.o. male presenting with ankle pain and syncope. The history is provided by the patient.  Ankle Pain   Loss of Consciousness This is a new problem. The current episode started today. Pertinent negatives include no abdominal pain, chest pain, coughing, fever, nausea, vomiting or weakness. Associated symptoms comments: He was sitting and stood to walk. He ambulated across the room and "went out", falling to the ground onto left ankle. Patient and friends at bedside agree he was out for seconds only. He denies chest pain, SOB, pre-syncopal dizziness, recent illness or fever. He complains of ankle pain only. He denies head injury..    Past Medical History  Diagnosis Date  . Venous thromboembolism     DVTs in 2004, 2005, 2007, 8/10, 8/11. IVC filter 2005. Off coumadin 04/2011 secondary to rectal bleeding  . 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 Chronic venous insufficiency   . DM2 (diabetes mellitus, type 2)   . GERD (gastroesophageal reflux disease)   . BPH (benign prostatic hypertrophy)   . Low back pain   . HTN (hypertension)   . HLD (hyperlipidemia)   . NICM (nonischemic cardiomyopathy) 1. 9/11  2. 2/12    1. Echo septal and apical akinesis, dilated LV, EF 45%, RV normal size and systolic function, EF 43% on Myoview  2. Admitted with decompensated CHF, echo EF 30-35% with diffuse hypokinesis but akinesis of mid to apical anteroseptal wall and apex, moderate LVH, mild MR, grade I diastolic dysfunction, SPEP/UP negative and HIV negative. Most recent EF 10-15% 04/2011  . Smoking   . CAD (coronary artery disease) 1. 2007  2. 10/11    1. Left heart cath with 40-50%  stenosis in small RCA EF 50%  2. Lexiscan myoview EF 43%, global hypokinesis, possible small area of apical ischemia; LHC no angiographic CAD, no LV-gram done due to CKD   2. No angiographic CAD by cath 02/2010  . CKD (chronic kidney disease) stage 2, GFR 60-89 ml/min as of 02/2011  . Headache   . Diabetes mellitus   . Blood transfusion   . Anemia     Past Surgical History  Procedure Date  . Cardiac catheterization   . Appendectomy   . Cholecystectomy   . Colonoscopy 05/01/2011    Procedure: COLONOSCOPY;  Surgeon: Louis Meckel, MD;  Location: [facility] ENDOSCOPY;  Service: Endoscopy;  Laterality: N/A;    Family History  Problem Relation Age of Onset  . Coronary artery disease Neg Hx     Premature    History  Substance Use Topics  . Smoking status: Former Smoker -- 1.0 packs/day for 40 years    Types: Cigarettes    Quit date: 06/30/2011  . Smokeless tobacco: Never Used  . Alcohol Use: No      Review of Systems  Constitutional: Negative for fever.  Respiratory: Negative for cough and shortness of breath.   Cardiovascular: Positive for syncope. Negative for chest pain.  Gastrointestinal: Negative for nausea, vomiting and abdominal pain.  Genitourinary: Negative for dysuria.  Musculoskeletal:       See HPI.  Skin: Negative for wound.  Neurological: Positive for syncope. Negative for dizziness and weakness.  Allergies  Review of patient's allergies indicates no known allergies.  Home Medications   Current Outpatient Rx  Name Route Sig Dispense Refill  . ATORVASTATIN CALCIUM 20 MG PO TABS Oral Take 20 mg by mouth every morning.     Marland Kitchen DIGOXIN 0.125 MG PO TABS Oral Take 125 mcg by mouth daily.    . EPLERENONE 25 MG PO TABS Oral Take 25 mg by mouth daily.    Marland Kitchen ESOMEPRAZOLE MAGNESIUM 40 MG PO CPDR Oral Take 40 mg by mouth daily before breakfast.    . GLIPIZIDE ER 5 MG PO TB24 Oral Take 5 mg by mouth daily.      Marland Kitchen HYDRALAZINE HCL 25 MG PO TABS Oral Take 37.5 mg by mouth 3  (three) times daily.    . INSULIN GLARGINE 100 UNIT/ML  SOLN Subcutaneous Inject 20 Units into the skin at bedtime.     . ISOSORBIDE MONONITRATE ER 30 MG PO TB24 Oral Take 30 mg by mouth daily.    Marland Kitchen LANSOPRAZOLE 30 MG PO CPDR Oral Take 30 mg by mouth daily.      Marland Kitchen METOLAZONE 2.5 MG PO TABS Oral Take 2.5 mg by mouth every 7 (seven) days. Every Monday    . METOPROLOL SUCCINATE ER 50 MG PO TB24 Oral Take 50 mg by mouth daily. Take with or immediately following a meal.    . NAPHAZOLINE HCL 0.012 % OP SOLN Both Eyes Place 2 drops into both eyes at bedtime as needed. For dry/itchy eyes    . TAMSULOSIN HCL 0.4 MG PO CAPS Oral Take 1 capsule (0.4 mg total) by mouth at bedtime. 30 capsule 6  . TORSEMIDE 20 MG PO TABS Oral Take 20 mg by mouth daily.      BP 113/69  Pulse 91  Temp(Src) 97.7 F (36.5 C) (Oral)  Resp 18  SpO2 100%  Physical Exam  Constitutional: He is oriented to person, place, and time. He appears well-developed and well-nourished. No distress.  HENT:  Head: Normocephalic and atraumatic.  Neck: Normal range of motion.  Cardiovascular: Normal rate.   No murmur heard. Pulmonary/Chest: Effort normal and breath sounds normal. He has no wheezes. He has no rales. He exhibits no tenderness.  Abdominal: Soft. There is no tenderness. There is no rebound.  Musculoskeletal: He exhibits no edema.       No spinal pain. Right ankle minimally swollen, tender laterally. Joint stable. Distal pulses present, distal neurosensory exam intact.   Neurological: He is alert and oriented to person, place, and time.    ED Course  Procedures (including critical care time)  Labs Reviewed  CBC - Abnormal; Notable for the following:    Hemoglobin 12.2 (*)    MCV 76.2 (*)    MCH 23.8 (*)    RDW 17.3 (*)    All other components within normal limits  DIFFERENTIAL - Abnormal; Notable for the following:    Neutrophils Relative 83 (*)    Lymphocytes Relative 10 (*)    Lymphs Abs 0.6 (*)    All other  components within normal limits  BASIC METABOLIC PANEL - Abnormal; Notable for the following:    Sodium 130 (*)    Chloride 89 (*)    Glucose, Bld 176 (*)    BUN 69 (*)    Creatinine, Ser 3.47 (*)    GFR calc non Af Amer 17 (*)    GFR calc Af Amer 20 (*)    All other components within normal limits  Dg Ankle 2 Views Left  09/19/2011  *RADIOLOGY REPORT*  Clinical Data: Ankle pain post fall  LEFT ANKLE - 2 VIEW  Comparison: None  Findings: Osseous demineralization. Ankle mortise intact. Subtle oblique nondisplaced distal right fibular fracture. No additional fracture or dislocation seen. Plantar calcaneal spur.  IMPRESSION: Nondisplaced oblique distal right fibular fracture. Osseous demineralization.  Original Report Authenticated By: Lollie Marrow, M.D.     No diagnosis found. 1. Syncope 2. Fibula fracture   MDM  Ankle splinted for treatment of nondisplaced fibula fx. Syncope discussed with Dr. Gala Romney who follows the patient in CHF clinic. He advised admission to monitor heart rythym, [place] to see in ED.        Rodena Medin, PA-C 09/19/11 1903

## 2011-09-19 NOTE — ED Notes (Signed)
Pt c/o right ankle pain after falling today; pt sts may have twisted it; no obvious deformity

## 2011-09-20 ENCOUNTER — Encounter (HOSPITAL_COMMUNITY): Payer: Self-pay | Admitting: General Practice

## 2011-09-20 LAB — BASIC METABOLIC PANEL
CO2: 26 mEq/L (ref 19–32)
Calcium: 9 mg/dL (ref 8.4–10.5)
Glucose, Bld: 138 mg/dL — ABNORMAL HIGH (ref 70–99)
Potassium: 3.5 mEq/L (ref 3.5–5.1)
Sodium: 134 mEq/L — ABNORMAL LOW (ref 135–145)

## 2011-09-20 LAB — CBC
HCT: 36.5 % — ABNORMAL LOW (ref 39.0–52.0)
MCV: 76.8 fL — ABNORMAL LOW (ref 78.0–100.0)
RBC: 4.75 MIL/uL (ref 4.22–5.81)
WBC: 5.3 10*3/uL (ref 4.0–10.5)

## 2011-09-20 LAB — GLUCOSE, CAPILLARY
Glucose-Capillary: 113 mg/dL — ABNORMAL HIGH (ref 70–99)
Glucose-Capillary: 216 mg/dL — ABNORMAL HIGH (ref 70–99)

## 2011-09-20 LAB — MRSA PCR SCREENING: MRSA by PCR: NEGATIVE

## 2011-09-20 MED ORDER — SPIRONOLACTONE 12.5 MG HALF TABLET
12.5000 mg | ORAL_TABLET | Freq: Every day | ORAL | Status: DC
  Administered 2011-09-20: 12.5 mg via ORAL
  Filled 2011-09-20 (×2): qty 1

## 2011-09-20 MED ORDER — DIGOXIN 0.0625 MG HALF TABLET: 0.0625 mg | ORAL_TABLET | Freq: Every day | ORAL | Status: DC

## 2011-09-20 NOTE — Plan of Care (Signed)
Problem: Phase I Progression Outcomes Goal: EF % per last Echo/documented,Core Reminder form on chart Echo 12/12 EF 10-15%

## 2011-09-20 NOTE — Progress Notes (Signed)
Patient is refusing to let staff remove or take off boot to assess skin________________________D. Manson Passey RN

## 2011-09-20 NOTE — ED Provider Notes (Signed)
Medical screening examination/treatment/procedure(s) were conducted as a shared visit with non-physician practitioner(s) and myself.  I personally evaluated the patient during the encounter. Patient with syncope and nondisplaced fibular fracture. History of heart failure. He'll be admitted to Dr. Melburn Popper service  Harrold Donath R. Rubin Payor, MD 09/20/11 2333

## 2011-09-20 NOTE — Progress Notes (Signed)
Patient continues to not allow staff to remove boot for assessment.

## 2011-09-20 NOTE — Progress Notes (Addendum)
Advanced Heart Failure Rounding Note   Subjective:     Admitted 5/3 with syncope and R fibula fracture likely due to orthostatinc hypotension.  Given 1L IVF. Creatinine improving but not at baseline. No events on tele. Denise CP/SOB/orthopnea.    Objective:   Weight Range:  Vital Signs:   Temp:  [97.5 F (36.4 C)-97.8 F (36.6 C)] 97.8 F (36.6 C) (05/04 9562) Pulse Rate:  [86-101] 86  (05/04 0633) Resp:  [14-20] 18  (05/04 1308) BP: (98-121)/(53-77) 107/63 mmHg (05/04 0633) SpO2:  [96 %-100 %] 96 % (05/04 0633) FiO2 (%):  [28 %] 28 % (05/03 2141) Weight:  [77.248 kg (170 lb 4.8 oz)-77.52 kg (170 lb 14.4 oz)] 77.248 kg (170 lb 4.8 oz) (05/04 0633) Last BM Date: 09/19/11  Weight change: Filed Weights   09/19/11 2142 09/20/11 0633  Weight: 77.52 kg (170 lb 14.4 oz) 77.248 kg (170 lb 4.8 oz)    Intake/Output:   Intake/Output Summary (Last 24 hours) at 09/20/11 0839 Last data filed at 09/20/11 0700  Gross per 24 hour  Intake   1160 ml  Output    750 ml  Net    410 ml     Physical Exam:     History and Physical      Patient ID: [name] MRN: [id number], DOB/AGE: 11-09-1945    Admit date: 09/19/2011 Date of Consult: 09/19/2011     Primary Physician: Karie Chimera, MD, MD Primary Cardiologist: Nicholes Mango, MD (HF)   Pt. Profile: [name] is a 66 year old male with a complicated PMHx including with chronic systolic CHF secondary to NICM echo EF 10-15% by echo 04/2011 (previously 30-35% 06/2010), HTN, anemia, chronic renal insufficiency Cr 1.31, UGI bleed, duodenal AVMs, rectal bleeding 04/2011, DVTs (2004, 2005, 2007, 2010, 2011) however coumadin on hold due to GI bleed, IVC filter 2005, GERD, BPD, and chronic low back who presents to [facility] ED with syncope and resultant fibula fracture.      Problem List: Past Medical History   Diagnosis  Date   .  Venous thromboembolism         DVTs in 2004, 2005, 2007, 8/10, 8/11. IVC filter 2005. Off coumadin  04/2011 secondary to rectal bleeding   .  AVM (arteriovenous malformation)         duodenal -- w/ GI bleed   .  Chronic venous insufficiency     .  DM2 (diabetes mellitus, type 2)     .  GERD (gastroesophageal reflux disease)     .  BPH (benign prostatic hypertrophy)     .  Low back pain     .  HTN (hypertension)     .  HLD (hyperlipidemia)     .  NICM (nonischemic cardiomyopathy)  1. 9/11  2. 2/12       1. Echo septal and apical akinesis, dilated LV, EF 45%, RV normal size and systolic function, EF 43% on Myoview  2. Admitted with decompensated CHF, echo EF 30-35% with diffuse hypokinesis but akinesis of mid to apical anteroseptal wall and apex, moderate LVH, mild MR, grade I diastolic dysfunction, SPEP/UP negative and HIV negative. Most recent EF 10-15% 04/2011   .  Smoking     .  CAD (coronary artery disease)  1. 2007  2. 10/11       1. Left heart cath with 40-50% stenosis in small RCA EF 50%  2. Lexiscan myoview EF 43%, global hypokinesis, possible small area of apical  ischemia; LHC no angiographic CAD, no LV-gram done due to CKD   2. No angiographic CAD by cath 02/2010   .  CKD (chronic kidney disease) stage 2, GFR 60-89 ml/min  as of 02/2011   .  Headache     .  Diabetes mellitus     .  Blood transfusion     .  Anemia        Past Surgical History   Procedure  Date   .  Cardiac catheterization     .  Appendectomy     .  Cholecystectomy     .  Colonoscopy  05/01/2011       Procedure: COLONOSCOPY;  Surgeon: Louis Meckel, MD;  Location: [facility] ENDOSCOPY;  Service: Endoscopy;  Laterality: N/A;      Allergies: No Known Allergies   HPI:    Admitted for HF with massive volume overload in 2/13. Treated with milrinone. Diuresed well. Cr down to ~1.2-1.3. RHC after diuresis    RA = 4   RV = 32/6 (7)   PA = 45/15 (24)   PCW = 7   Fick cardiac output/index = 5.2/2.7   Thermo = 4.0/2.1   PVR = 3.4 [place]   FA sat = 94%   PA sat = 56%, 57%   Noninvasive BP : 125/92 (72)     Calculated SVR = 1040   CPX test 08/13/11   Peak VO2: 18.6 ml/kg/min predicted peak VO2: 61.4% VE/VCO2 slope: 42.8 OUES: 1.26 Peak RER: 1.16   The patient was recently seen in the heart failure clinic on 09/02/11. At that time, he was noted to be fairly euvolemic on multiple heart failure meds- digoxin, eplerenone, hydralazine/imdur, metolazone, toprol-XL and demadex.    He states that his fluid has been well-controlled. His weight this morning was 162 lbs. He states his BP has been fairly low recently. He reports a BP of 70/60 mmHg. He reports standing up to walk outside to the car. He immediately felt lightheaded. He remembers opening a door, "everything went black" and he woke up on the ground with severe R leg pain a few seconds later. He does reports tachy-palpitations described as "heart racing" prior to standing and ambulating. He denies chest pain, shortness of breath, diaphoresis, n/v/d, urinary or bowel changes. He had eaten that morning. He denies tongue biting, losing control of bowel or urine function or involuntary movemnts. He was wearing loose fitted clothing.    In the ED, EKG reveals NSR without evidence of ischemia. He was noted to be hyponatremic at 130 with an acute on CKD (BUN/Cr at 69/3.47; this has progressively worsened over the past few months, highest today). Radiographs of the R ankle reveal nondisplaced oblique right fibular fracture with osseous demineralization.    Home Medications: Prior to Admission medications    Medication  Sig  Start Date  End Date  Taking?  Authorizing Provider   atorvastatin (LIPITOR) 20 MG tablet  Take 20 mg by mouth every morning.       Yes  Historical Provider, MD   digoxin (LANOXIN) 0.125 MG tablet  Take 125 mcg by mouth daily.      Yes  Amy D Clegg, NP   eplerenone (INSPRA) 25 MG tablet  Take 25 mg by mouth daily.      Yes  Amy D Clegg, NP   esomeprazole (NEXIUM) 40 MG capsule  Take 40 mg by mouth daily before breakfast.      Yes  Bevelyn Buckles Pritesh Sobecki, MD   glipiZIDE (GLUCOTROL XL) 5 MG 24 hr tablet  Take 5 mg by mouth daily.        Yes  Historical Provider, MD   hydrALAZINE (APRESOLINE) 25 MG tablet  Take 37.5 mg by mouth 3 (three) times daily.      Yes  Dolores Patty, MD   insulin glargine (LANTUS) 100 UNIT/ML injection  Inject 20 Units into the skin at bedtime.       Yes  Historical Provider, MD   isosorbide mononitrate (IMDUR) 30 MG 24 hr tablet  Take 30 mg by mouth daily.      Yes  Amy D Clegg, NP   lansoprazole (PREVACID) 30 MG capsule  Take 30 mg by mouth daily.        Yes  Historical Provider, MD   metolazone (ZAROXOLYN) 2.5 MG tablet  Take 2.5 mg by mouth every 7 (seven) days. Every Monday  08/18/11    Yes  Amy D Clegg, NP   metoprolol succinate (TOPROL-XL) 50 MG 24 hr tablet  Take 50 mg by mouth daily. Take with or immediately following a meal.      Yes  Amy D Clegg, NP   naphazoline (CLEAR EYES) 0.012 % ophthalmic solution  Place 2 drops into both eyes at bedtime as needed. For dry/itchy eyes      Yes  Historical Provider, MD   Tamsulosin HCl (FLOMAX) 0.4 MG CAPS  Take 1 capsule (0.4 mg total) by mouth at bedtime.  09/15/11    Yes  Dolores Patty, MD   torsemide (DEMADEX) 20 MG tablet  Take 20 mg by mouth daily.      Yes  Amy Georgie Chard, NP        Inpatient Medications:      .   morphine injection   4 mg  Intravenous  Once    (Not in a hospital admission)    Family History   Problem  Relation  Age of Onset   .  Coronary artery disease  Neg Hx         Premature       History       Social History   .  Marital Status:  Divorced       Spouse Name:  N/A       Number of Children:  1   .  Years of Education:  N/A       Occupational History   .  security guard         Part time       Social History Main Topics   .  Smoking status:  Former Smoker -- 1.0 packs/day for 40 years       Types:  Cigarettes       Quit date:  06/30/2011   .  Smokeless tobacco:  Never Used   .  Alcohol Use:  No   .   Drug Use:  No   .  Sexually Active:  Not on file       Other Topics  Concern   .  Not on file       Social History Narrative     Has 1 son.        Review of Systems: General: negative for chills, fever, night sweats or weight changes.   Cardiovascular: positive for palpitations, lightheadedness and syncope, negative for chest pain, dyspnea on exertion, edema, orthopnea, paroxysmal nocturnal dyspnea or shortness of breath Dermatological:  negative for rash Respiratory: negative for cough or wheezing Urologic: negative for hematuria Abdominal: negative for nausea, vomiting, diarrhea, bright red blood per rectum, melena, or hematemesis Neurologic:  No involuntary movements, incontinence or tongue biting All other systems reviewed and are otherwise negative except as noted above.   Physical Exam: Filed Vitals:   09/19/11 2045 09/19/11 2141 09/19/11 2142 09/20/11 0633  BP: 110/71  112/73 107/63  Pulse: 93  87 86  Temp:   97.6 F (36.4 C) 97.8 F (36.6 C)  TempSrc:   Oral Oral  Resp: 18  16 18   Height:   5\' 11"  (1.803 m)   Weight:   77.52 kg (170 lb 14.4 oz) 77.248 kg (170 lb 4.8 oz)  SpO2: 100% 100% 100% 96%    General: Appears older than stated age, in no acute distress. Head: Normocephalic, atraumatic, sclera non-icteric, no xanthomas, nares are without discharge.        Neck: Negative for carotid bruits. JVD 7 Lungs:  Clear bilaterally to auscultation without wheezes, rales, or rhonchi Heart: Regular tachy faint systolic and diastolic murmurs noted at LUSB, with S1 S2. No murmurs, rubs, or gallops appreciated. Abdomen: Soft, non-tender, non-distended with normoactive bowel sounds. No hepatomegaly. No rebound/guarding. No obvious abdominal masses. Msk:  Strength and tone appears normal for age. Extremities: RLE splinted, no gross abnormalities noted on inspection, not otherwise assessed. LLE- No clubbing, cyanosis or edema.  Distal pedal pulses are 2+ in LLE. Neuro: Alert  and oriented X 3. Moves all extremities spontaneously. Psych:  Responds to questions appropriately with a normal affect.     Telemetry: Sinus tach no arrhytmias  Labs: Basic Metabolic Panel:  Lab 09/20/11 1478 09/19/11 1519  NA 134* 130*  K 3.5 4.6  CL 95* 89*  CO2 26 28  GLUCOSE 138* 176*  BUN 67* 69*  CREATININE 2.88* 3.47*  CALCIUM 9.0 9.5  MG -- --  PHOS -- --    Liver Function Tests: No results found for this basename: AST:5,ALT:5,ALKPHOS:5,BILITOT:5,PROT:5,ALBUMIN:5 in the last 168 hours No results found for this basename: LIPASE:5,AMYLASE:5 in the last 168 hours No results found for this basename: AMMONIA:3 in the last 168 hours  CBC:  Lab 09/20/11 0500 09/19/11 1431  WBC 5.3 6.6  NEUTROABS -- 5.5  HGB 11.1* 12.2*  HCT 36.5* 39.1  MCV 76.8* 76.2*  PLT 211 273    Cardiac Enzymes: No results found for this basename: CKTOTAL:5,CKMB:5,CKMBINDEX:5,TROPONINI:5 in the last 168 hours  BNP: BNP (last 3 results)  Basename 09/02/11 1235 06/20/11 1530 05/12/11 1613  PROBNP 284.2* 3661.0* 2830.0*     Other results:  Imaging: Dg Ankle 2 Views Right  09/19/2011  *RADIOLOGY REPORT*  Clinical Data: Ankle pain post fall  LEFT ANKLE - 2 VIEW  Comparison: None  Findings: Osseous demineralization. Ankle mortise intact. Subtle oblique nondisplaced distal right fibular fracture. No additional fracture or dislocation seen. Plantar calcaneal spur.  IMPRESSION: Nondisplaced oblique distal right fibular fracture. Osseous demineralization.  Original Report Authenticated By: Lollie Marrow, M.D.      Medications:     Scheduled Medications:    . digoxin  125 mcg Oral Daily  . enoxaparin  30 mg Subcutaneous Q24H  . glipiZIDE  5 mg Oral QAC breakfast  . insulin glargine  20 Units Subcutaneous QHS  . metolazone  2.5 mg Oral Q7 days  . metoprolol succinate  50 mg Oral Daily  .  morphine injection  4 mg Intravenous Once  . pantoprazole  80 mg  Oral Q1200  . simvastatin  40  mg Oral Daily  . sodium chloride  3 mL Intravenous Q12H  . spironolactone  25 mg Oral Daily  . Tamsulosin HCl  0.4 mg Oral QHS  . torsemide  20 mg Oral Daily     Infusions:    . sodium chloride 100 mL/hr at 09/20/11 0231     PRN Medications:  sodium chloride, acetaminophen, ALPRAZolam, morphine injection, ondansetron (ZOFRAN) IV, sodium chloride, tetrahydrozoline, zolpidem, DISCONTD: naphazoline   Assessment:   1. Syncope likely orthostatic with R fibula fracture  2. Chronic systolic HF  3. NICM EF 10-15%  4. A/C Renal failure - improving 5. H/o multiple DVTs/PE with IVC filter  --not on coumadin due to recurrent GI bleeds due to AVMs  6. DM2   Plan/Discussion:     I suspect Mr. Yoakum' syncope is orthostatic in nature but also cannot exclude tachyarrhythmia. He is improved with hydration. No events on tele.  I am more concerned about the bigger picture. His renal function is much worse despite the fact that he really isn't that far below his baseline weight and I worry he has a component of low output HF. Will continue to hold diuretics and watch his renal function.   If his renal function does not improve significantly with holding diuretics and hydralazine, I would consider repeat RHC and have low threshold to start milrinone to preserve his renal function and permit further consideration of advanced therapies. I explained to him that if we were to consider a VAD he would need to go back on coumadin and make sure he does not have further GI bleeding before a device could be considered. He is willing to try it again. I will need to review his h/o bleeding and endoscopies more closely.   Dig level high will cut back.  If Cr under 2.0 tomorrow can go home with close f/u in HF clinic, if > 2.0 would keep.   Length of Stay: 1   Arvilla Meres 09/20/2011, 8:39 AM

## 2011-09-21 ENCOUNTER — Encounter (HOSPITAL_COMMUNITY): Payer: Self-pay | Admitting: Nurse Practitioner

## 2011-09-21 DIAGNOSIS — S82401A Unspecified fracture of shaft of right fibula, initial encounter for closed fracture: Secondary | ICD-10-CM | POA: Insufficient documentation

## 2011-09-21 LAB — GLUCOSE, CAPILLARY
Glucose-Capillary: 152 mg/dL — ABNORMAL HIGH (ref 70–99)
Glucose-Capillary: 228 mg/dL — ABNORMAL HIGH (ref 70–99)

## 2011-09-21 LAB — BASIC METABOLIC PANEL
BUN: 41 mg/dL — ABNORMAL HIGH (ref 6–23)
CO2: 29 mEq/L (ref 19–32)
Calcium: 9.8 mg/dL (ref 8.4–10.5)
Creatinine, Ser: 1.94 mg/dL — ABNORMAL HIGH (ref 0.50–1.35)

## 2011-09-21 MED ORDER — DIGOXIN 125 MCG PO TABS: 62.5000 ug | ORAL_TABLET | Freq: Every day | ORAL | Status: DC

## 2011-09-21 MED ORDER — TORSEMIDE 20 MG PO TABS: 20.0000 mg | ORAL_TABLET | Freq: Every day | ORAL | Status: DC

## 2011-09-21 MED ORDER — HYDROCODONE-ACETAMINOPHEN 5-500 MG PO TABS: 1.0000 | ORAL_TABLET | Freq: Four times a day (QID) | ORAL | Status: AC | PRN

## 2011-09-21 MED ORDER — EPLERENONE 25 MG PO TABS: 12.5000 mg | ORAL_TABLET | Freq: Every day | ORAL | Status: DC

## 2011-09-21 NOTE — Discharge Summary (Signed)
Patient ID: [name],  MRN: [id number], DOB/AGE: August 21, 1945 66 y.o.  Admit date: 09/19/2011 Discharge date: 09/21/2011  Primary Care Provider: Karie Chimera, MD Primary Cardiologist: D. Dayonna Selbe, MD  Discharge Diagnoses Principal Problem:  *Syncope Active Problems:  HYPERLIPIDEMIA-MIXED  Essential hypertension, benign  SYSTOLIC HEART FAILURE, CHRONIC  Non-ischemic cardiomyopathy  Diabetes mellitus  Orthostatic hypotension  Acute on chronic renal failure  Right fibular fracture   Allergies No Known Allergies  Procedures  2 View of Right Ankle  IMPRESSION: Nondisplaced oblique distal right fibular fracture.  Osseous demineralization.  History of Present Illness  66 y/o male with the above complex problem list.  He was in his USOH until the day of admission when he stood up at home to walk out to his car and noted sudden lightheadedness.  He remembered opening a door but then experienced syncope.  His next memory was of regaining consciousness while on the ground @ which point he noted severe right lower extremity pain.  EMS was activated and pt was taken to the ED where ECG showed no acute changes.  He was hyponatremic @ 130 and was noted to have elevation of his BUN/Creat @ 69 & 3.47 respectively.  X-rays of his right lower extremity revealed a nondisplaced oblique distal right fibular fracture.  He was admitted for further evaluation.  Hospital Course  In light of his syncope with high suspicion for volume depletion and probable orthostasis, resulting in syncope, pts diuretics, hydralazine, and long acting nitroglycerin were held.  He was gently hydrated with slow but steady improvement in his renal function.  His fibular fracture has been managed conservatively with placement of a CAM walker and crutches have been provided.  He has been seen by PT and HHPT has been arranged.  We've recommend that pt f/u with orthopedics this week.  Mr. Wiederholt will be discharged home  today in good condition.  We will arrange for CHF clinic f/u later this week.  We have reduced his home doses of digoxin and eplerenone and have discontinued hydralazine and imdur.  We've asked him to hold off on resuming his home dose of torsemide until Tuesday 5/7.  We will arrange for an outpt echo to re-eval LV fxn and consider EP eval @ that point.  Discharge Vitals Blood pressure 131/74, pulse 85, temperature 97.4 F (36.3 C), temperature source Oral, resp. rate 18, height 5\' 11"  (1.803 m), weight 170 lb 4.8 oz (77.248 kg), SpO2 97.00%.  Filed Weights   09/19/11 2142 09/20/11 0633  Weight: 170 lb 14.4 oz (77.52 kg) 170 lb 4.8 oz (77.248 kg)   Labs  CBC  Basename 09/20/11 0500 09/19/11 1431  WBC 5.3 6.6  NEUTROABS -- 5.5  HGB 11.1* 12.2*  HCT 36.5* 39.1  MCV 76.8* 76.2*  PLT 211 273   Basic Metabolic Panel  Basename 09/21/11 0608 09/20/11 0500  NA 137 134*  K 3.9 3.5  CL 98 95*  CO2 29 26  GLUCOSE 67* 138*  BUN 41* 67*  CREATININE 1.94* 2.88*  CALCIUM 9.8 9.0  MG -- --  PHOS -- --   Disposition  Pt is being discharged home today in good condition.  Follow-up Plans & Appointments  Follow-up Information    Follow up with [facility] on 09/26/2011. (Heart Failure Clinic Follow-Up - They will call you with time.)       Follow up with [facility]. (call for appt this week to f/u for you leg)    Contact  information:   [address] [phone]         Discharge Medications  Medication List  As of 09/21/2011 12:40 PM   STOP taking these medications         hydrALAZINE 25 MG tablet      isosorbide mononitrate 30 MG 24 hr tablet      lansoprazole 30 MG capsule      metolazone 2.5 MG tablet         TAKE these medications         atorvastatin 20 MG tablet   Commonly known as: LIPITOR   Take 20 mg by mouth every morning.      digoxin 0.125 MG tablet   Commonly known as: LANOXIN   Take 0.5 tablets (62.5 mcg total) by  mouth daily.      eplerenone 25 MG tablet   Commonly known as: INSPRA   Take 0.5 tablets (12.5 mg total) by mouth daily.      esomeprazole 40 MG capsule   Commonly known as: NEXIUM   Take 40 mg by mouth daily before breakfast.      glipiZIDE 5 MG 24 hr tablet   Commonly known as: GLUCOTROL XL   Take 5 mg by mouth daily.      HYDROcodone-acetaminophen 5-500 MG per tablet   Commonly known as: VICODIN   Take 1 tablet by mouth every 6 (six) hours as needed for pain.      insulin glargine 100 UNIT/ML injection   Commonly known as: LANTUS   Inject 20 Units into the skin at bedtime.      metoprolol succinate 50 MG 24 hr tablet   Commonly known as: TOPROL-XL   Take 50 mg by mouth daily. Take with or immediately following a meal.      naphazoline 0.012 % ophthalmic solution   Commonly known as: CLEAR EYES   Place 2 drops into both eyes at bedtime as needed. For dry/itchy eyes      Tamsulosin HCl 0.4 MG Caps   Commonly known as: FLOMAX   Take 1 capsule (0.4 mg total) by mouth at bedtime.      torsemide 20 MG tablet - TO BE RESUMED Tuesday 09/23/11.   Commonly known as: DEMADEX   Take 1 tablet (20 mg total) by mouth daily.          Outstanding Labs/Studies  F/U Echo as an outpt  Duration of Discharge Encounter   Greater than 30 minutes including physician time.  Signed, Nicolasa Ducking NP 09/21/2011, 12:40 PM   Patient seen and examined independently. Gilford Raid, NP note reviewed carefully - agree with his assessment and plan. Mr. Ratajczak is ready for d/c today. See my rounding note for full details.   Elese Rane,MD 11:41 PM

## 2011-09-21 NOTE — Progress Notes (Signed)
Physical Therapy Evaluation Patient Details Name: [name] MRN: [id number] DOB: Dec 20, 1945 Today's Date: 09/21/2011 Time: 1110-1130 PT Time Calculation (min): 20 min  PT Assessment / Plan / Recommendation Clinical Impression  66 yo male admitted with syncopal fall resulting in R fibular fracture presents with gait inefficiencies;  For eminent DC home today;  Will benfit from HHPT follow up for home safety eval and to help increase efficiency and safety with gait Case manager and Care Coordinator notified    PT Assessment  All further PT needs can be met in the next venue of care    Follow Up Recommendations  Home health PT;Supervision - Intermittent    Equipment Recommendations  Rolling walker with 5" wheels (Crutches had been ordered, and were adjusted to fit pt)    Frequency      Precautions / Restrictions Precautions Required Braces or Orthoses: Other Brace/Splint Other Brace/Splint: CAM boot right Restrictions Weight Bearing Restrictions:RLE Weight Bearing: Weight bearing as tolerated   Pertinent Vitals/Pain Painful Right ankle with amb; antalgic gait; pt did not rate pain when asked to       Mobility  Bed Mobility Bed Mobility: Not assessed (Pt declined; he foresees no problem with bed mobility) Transfers Transfers: Sit to Stand;Stand to Sit Sit to Stand: 5: Supervision Stand to Sit: 5: Supervision Details for Transfer Assistance: cues for safety, hand placement; in particular, cues to back up and touch the surface that he will be sitting down to with the back of his LEs (esp. Left) for safety prior to sitting Ambulation/Gait Ambulation/Gait Assistance: 5: Supervision;4: Min guard Ambulation Distance (Feet): 30 Feet (25 with RW; 5 with crutches) Assistive device: Rolling walker;Crutches Ambulation/Gait Assistance Details: Cues for safety, gait sequence, appropriate step length with RW and crutches; adjusted both for proper fit; pt tending to have RW slightly  too far infront of him; Verbal and demonstrational cues for use of both RW and crutches Gait Pattern: Step-to pattern Gait velocity: slow    Exercises     PT Goals    Visit Information  Last PT Received On: 09/21/11 Assistance Needed: +1    Subjective Data  Subjective: Reports plans on using his own RW at home Patient Stated Goal: Home   Prior Functioning  Home Living Lives With: Alone Available Help at Discharge: Friend(s);Available PRN/intermittently Type of Home: Apartment Home Access: Elevator;Level entry Home Layout: One level Home Adaptive Equipment: Walker - rolling;Crutches Prior Function Level of Independence: Independent Comments: Pt reports when he goes home (he wants to go via cab) a friend can bring his own RW out to the cab, so he can use it to get into his home Communication Communication: No difficulties    Cognition  Overall Cognitive Status: Appears within functional limits for tasks assessed/performed Arousal/Alertness: Awake/alert Orientation Level: Appears intact for tasks assessed Behavior During Session: Restless (Impuslive) Cognition - Other Comments: Pt very focused on getting home; he often interrupted this therapist during teaching    Extremity/Trunk Assessment Right Upper Extremity Assessment RUE ROM/Strength/Tone: Within functional levels Left Upper Extremity Assessment LUE ROM/Strength/Tone: Within functional levels Right Lower Extremity Assessment RLE ROM/Strength/Tone: Deficits RLE ROM/Strength/Tone Deficits: Limited by pain, Right ankle in CAM boot Left Lower Extremity Assessment LLE ROM/Strength/Tone: Within functional levels Trunk Assessment Trunk Assessment: Normal   Balance    End of Session PT - End of Session Equipment Utilized During Treatment: Gait belt Activity Tolerance: Patient tolerated treatment well Patient left: in bed;with call bell/phone within reach (sitting EOB) Nurse Communication: Mobility  status;Weight  bearing status (Rec for HHPT)   Van Clines [place], [place] [phone]  09/21/2011, 12:23 PM

## 2011-09-21 NOTE — Progress Notes (Signed)
Orthopedic Tech Progress Note Patient Details:  [name] January 01, 1946 [id number]  Other Ortho Devices Type of Ortho Device: Crutches Ortho Device Location: patient could not ambulate ask nurse to contact PT for PTconsult before sending patient home with crutches  Ortho Device Interventions: Adjustment   Cammer, Mickie Bail 09/21/2011, 10:37 AM

## 2011-09-21 NOTE — Progress Notes (Signed)
   CARE MANAGEMENT NOTE 09/21/2011  Patient:  [name], [name]   Account Number:  [id number]  Date Initiated:  09/21/2011  Documentation initiated by:  [facility]  Subjective/Objective Assessment:   syncope and R fibula fracture likely due to orthostatic hypotension.     Action/Plan:   lives at home alone, has DME at home   Anticipated DC Date:  09/21/2011   Anticipated DC Plan:  HOME W HOME HEALTH SERVICES      DC Planning Services  CM consult      [facility] Choice  HOME HEALTH   Choice offered to / List presented to:  C-1 Patient        HH arranged  HH-2 PT      [facility] agency  [facility]   Status of service:  Completed, signed off Medicare Important Message given?   (If response is "NO", the following Medicare IM given date fields will be blank) Date Medicare IM given:   Date Additional Medicare IM given:    Discharge Disposition:  HOME W HOME HEALTH SERVICES  Per UR Regulation:    If discussed at Long Length of Stay Meetings, dates discussed:    Comments:  09/21/2011 1215 Contacted [facility] for [facility] PT for scheduled d/c today. States he has RW at home. Isidoro Donning RN CCM Case Mgmt phone [phone]

## 2011-09-21 NOTE — Progress Notes (Signed)
Advanced Heart Failure Rounding Note   Subjective:     Admitted 5/3 with syncope and R fibula fracture likely due to orthostatic hypotension.  Given 1L IVF. Creatinine continues to improve. Feels great. Denise CP/SOB/orthopnea.    Objective:   Weight Range:  Vital Signs:   Temp:  [97.4 F (36.3 C)-97.8 F (36.6 C)] 97.4 F (36.3 C) (05/04 2210) Pulse Rate:  [85-94] 85  (05/04 2210) Resp:  [18] 18  (05/04 2210) BP: (121-138)/(69-86) 131/74 mmHg (05/04 2210) SpO2:  [95 %-97 %] 97 % (05/04 2210) Last BM Date: 09/19/11  Weight change: Filed Weights   09/19/11 2142 09/20/11 0633  Weight: 77.52 kg (170 lb 14.4 oz) 77.248 kg (170 lb 4.8 oz)    Intake/Output:   Intake/Output Summary (Last 24 hours) at 09/21/11 0838 Last data filed at 09/21/11 0236  Gross per 24 hour  Intake   1403 ml  Output   2450 ml  Net  -1047 ml     Physical Exam:     History and Physical      Patient ID: ARA MANO MRN: [id number], DOB/AGE: [birth year]    Admit date: 09/19/2011 Date of Consult: 09/19/2011     Primary Physician: Karie Chimera, MD, MD Primary Cardiologist: Nicholes Mango, MD (HF)   Pt. Profile: Mr. Gift is a 66 year old male with a complicated PMHx including with chronic systolic CHF secondary to NICM echo EF 10-15% by echo 04/2011 (previously 30-35% 06/2010), HTN, anemia, chronic renal insufficiency Cr 1.31, UGI bleed, duodenal AVMs, rectal bleeding 04/2011, DVTs (2004, 2005, 2007, 2010, 2011) however coumadin on hold due to GI bleed, IVC filter 2005, GERD, BPD, and chronic low back who presents to [facility] ED with syncope and resultant fibula fracture.      Problem List: Past Medical History   Diagnosis  Date   .  Venous thromboembolism         DVTs in 2004, 2005, 2007, 8/10, 8/11. IVC filter 2005. Off coumadin 04/2011 secondary to rectal bleeding   .  AVM (arteriovenous malformation)         duodenal -- w/ GI bleed   .  Chronic venous insufficiency     .  DM2  (diabetes mellitus, type 2)     .  GERD (gastroesophageal reflux disease)     .  BPH (benign prostatic hypertrophy)     .  Low back pain     .  HTN (hypertension)     .  HLD (hyperlipidemia)     .  NICM (nonischemic cardiomyopathy)  1. 9/11  2. 2/12       1. Echo septal and apical akinesis, dilated LV, EF 45%, RV normal size and systolic function, EF 43% on Myoview  2. Admitted with decompensated CHF, echo EF 30-35% with diffuse hypokinesis but akinesis of mid to apical anteroseptal wall and apex, moderate LVH, mild MR, grade I diastolic dysfunction, SPEP/UP negative and HIV negative. Most recent EF 10-15% 04/2011   .  Smoking     .  CAD (coronary artery disease)  1. 2007  2. 10/11       1. Left heart cath with 40-50% stenosis in small RCA EF 50%  2. Lexiscan myoview EF 43%, global hypokinesis, possible small area of apical ischemia; LHC no angiographic CAD, no LV-gram done due to CKD   2. No angiographic CAD by cath 02/2010   .  CKD (chronic kidney disease) stage 2, GFR 60-89 ml/min  as of 02/2011   .  Headache     .  Diabetes mellitus     .  Blood transfusion     .  Anemia        Past Surgical History   Procedure  Date   .  Cardiac catheterization     .  Appendectomy     .  Cholecystectomy     .  Colonoscopy  05/01/2011       Procedure: COLONOSCOPY;  Surgeon: Louis Meckel, MD;  Location: [facility] ENDOSCOPY;  Service: Endoscopy;  Laterality: N/A;      Allergies: No Known Allergies   HPI:    Admitted for HF with massive volume overload in 2/13. Treated with milrinone. Diuresed well. Cr down to ~1.2-1.3. RHC after diuresis    RA = 4   RV = 32/6 (7)   PA = 45/15 (24)   PCW = 7   Fick cardiac output/index = 5.2/2.7   Thermo = 4.0/2.1   PVR = 3.4 Woods   FA sat = 94%   PA sat = 56%, 57%   Noninvasive BP : 125/92 (72)   Calculated SVR = 1040   CPX test 08/13/11   Peak VO2: 18.6 ml/kg/min predicted peak VO2: 61.4% VE/VCO2 slope: 42.8 OUES: 1.26 Peak RER: 1.16   The  patient was recently seen in the heart failure clinic on 09/02/11. At that time, he was noted to be fairly euvolemic on multiple heart failure meds- digoxin, eplerenone, hydralazine/imdur, metolazone, toprol-XL and demadex.    He states that his fluid has been well-controlled. His weight this morning was 162 lbs. He states his BP has been fairly low recently. He reports a BP of 70/60 mmHg. He reports standing up to walk outside to the car. He immediately felt lightheaded. He remembers opening a door, "everything went black" and he woke up on the ground with severe R leg pain a few seconds later. He does reports tachy-palpitations described as "heart racing" prior to standing and ambulating. He denies chest pain, shortness of breath, diaphoresis, n/v/d, urinary or bowel changes. He had eaten that morning. He denies tongue biting, losing control of bowel or urine function or involuntary movemnts. He was wearing loose fitted clothing.    In the ED, EKG reveals NSR without evidence of ischemia. He was noted to be hyponatremic at 130 with an acute on CKD (BUN/Cr at 69/3.47; this has progressively worsened over the past few months, highest today). Radiographs of the R ankle reveal nondisplaced oblique right fibular fracture with osseous demineralization.    Home Medications: Prior to Admission medications    Medication  Sig  Start Date  End Date  Taking?  Authorizing Provider   atorvastatin (LIPITOR) 20 MG tablet  Take 20 mg by mouth every morning.       Yes  Historical Provider, MD   digoxin (LANOXIN) 0.125 MG tablet  Take 125 mcg by mouth daily.      Yes  Amy D Clegg, NP   eplerenone (INSPRA) 25 MG tablet  Take 25 mg by mouth daily.      Yes  Amy D Clegg, NP   esomeprazole (NEXIUM) 40 MG capsule  Take 40 mg by mouth daily before breakfast.      Yes  Dolores Patty, MD   glipiZIDE (GLUCOTROL XL) 5 MG 24 hr tablet  Take 5 mg by mouth daily.        Yes  Historical Provider, MD   hydrALAZINE  (APRESOLINE) 25 MG tablet  Take  37.5 mg by mouth 3 (three) times daily.      Yes  Dolores Patty, MD   insulin glargine (LANTUS) 100 UNIT/ML injection  Inject 20 Units into the skin at bedtime.       Yes  Historical Provider, MD   isosorbide mononitrate (IMDUR) 30 MG 24 hr tablet  Take 30 mg by mouth daily.      Yes  Amy D Clegg, NP   lansoprazole (PREVACID) 30 MG capsule  Take 30 mg by mouth daily.        Yes  Historical Provider, MD   metolazone (ZAROXOLYN) 2.5 MG tablet  Take 2.5 mg by mouth every 7 (seven) days. Every Monday  08/18/11    Yes  Amy D Clegg, NP   metoprolol succinate (TOPROL-XL) 50 MG 24 hr tablet  Take 50 mg by mouth daily. Take with or immediately following a meal.      Yes  Amy D Clegg, NP   naphazoline (CLEAR EYES) 0.012 % ophthalmic solution  Place 2 drops into both eyes at bedtime as needed. For dry/itchy eyes      Yes  Historical Provider, MD   Tamsulosin HCl (FLOMAX) 0.4 MG CAPS  Take 1 capsule (0.4 mg total) by mouth at bedtime.  09/15/11    Yes  Dolores Patty, MD   torsemide (DEMADEX) 20 MG tablet  Take 20 mg by mouth daily.      Yes  Amy Georgie Chard, NP        Inpatient Medications:      .   morphine injection   4 mg  Intravenous  Once    (Not in a hospital admission)    Family History   Problem  Relation  Age of Onset   .  Coronary artery disease  Neg Hx         Premature       History       Social History   .  Marital Status:  Divorced       Spouse Name:  N/A       Number of Children:  1   .  Years of Education:  N/A       Occupational History   .  security guard         Part time       Social History Main Topics   .  Smoking status:  Former Smoker -- 1.0 packs/day for 40 years       Types:  Cigarettes       Quit date:  06/30/2011   .  Smokeless tobacco:  Never Used   .  Alcohol Use:  No   .  Drug Use:  No   .  Sexually Active:  Not on file       Other Topics  Concern   .  Not on file       Social History Narrative     Has 1  son.        Review of Systems: General: negative for chills, fever, night sweats or weight changes.   Cardiovascular: positive for palpitations, lightheadedness and syncope, negative for chest pain, dyspnea on exertion, edema, orthopnea, paroxysmal nocturnal dyspnea or shortness of breath Dermatological: negative for rash Respiratory: negative for cough or wheezing Urologic: negative for hematuria Abdominal: negative for nausea, vomiting, diarrhea, bright red blood per rectum, melena, or hematemesis Neurologic:  No involuntary movements, incontinence or tongue biting All other systems reviewed and are  otherwise negative except as noted above.   Physical Exam: Filed Vitals:   09/20/11 1116 09/20/11 1347 09/20/11 1657 09/20/11 2210  BP: 124/69 121/72 138/86 131/74  Pulse: 94 85 87 85  Temp:  97.8 F (36.6 C) 97.7 F (36.5 C) 97.4 F (36.3 C)  TempSrc:  Oral Oral Oral  Resp: 18 18 18 18   Height:      Weight:      SpO2: 95% 96% 97% 97%    General: Appears older than stated age, in no acute distress. Head: Normocephalic, atraumatic, sclera non-icteric, no xanthomas, nares are without discharge.        Neck: Negative for carotid bruits. JVD 7 Lungs:  Clear bilaterally to auscultation without wheezes, rales, or rhonchi Heart: Regular tachy faint systolic and diastolic murmurs noted at LUSB, with S1 S2. No murmurs, rubs, or gallops appreciated. Abdomen: Soft, non-tender, non-distended with normoactive bowel sounds. No hepatomegaly. No rebound/guarding. No obvious abdominal masses. Msk:  Strength and tone appears normal for age. Extremities: RLE splinted, no gross abnormalities noted on inspection, not otherwise assessed. LLE- No clubbing, cyanosis or edema.  Distal pedal pulses are 2+ in LLE. Neuro: Alert and oriented X 3. Moves all extremities spontaneously. Psych:  Responds to questions appropriately with a normal affect.     Telemetry: Sinus tach no arrhytmias  Labs: Basic  Metabolic Panel:  Lab 09/21/11 1610 09/20/11 0500 09/19/11 1519  NA 137 134* 130*  K 3.9 3.5 4.6  CL 98 95* 89*  CO2 29 26 28   GLUCOSE 67* 138* 176*  BUN 41* 67* 69*  CREATININE 1.94* 2.88* 3.47*  CALCIUM 9.8 9.0 9.5  MG -- -- --  PHOS -- -- --    Liver Function Tests: No results found for this basename: AST:5,ALT:5,ALKPHOS:5,BILITOT:5,PROT:5,ALBUMIN:5 in the last 168 hours No results found for this basename: LIPASE:5,AMYLASE:5 in the last 168 hours No results found for this basename: AMMONIA:3 in the last 168 hours  CBC:  Lab 09/20/11 0500 09/19/11 1431  WBC 5.3 6.6  NEUTROABS -- 5.5  HGB 11.1* 12.2*  HCT 36.5* 39.1  MCV 76.8* 76.2*  PLT 211 273    Cardiac Enzymes: No results found for this basename: CKTOTAL:5,CKMB:5,CKMBINDEX:5,TROPONINI:5 in the last 168 hours  BNP: BNP (last 3 results)  Basename 09/21/11 0608 09/02/11 1235 06/20/11 1530  PROBNP 217.0* 284.2* 3661.0*     Other results:  Imaging: Dg Ankle 2 Views Right  09/19/2011  *RADIOLOGY REPORT*  Clinical Data: Ankle pain post fall  LEFT ANKLE - 2 VIEW  Comparison: None  Findings: Osseous demineralization. Ankle mortise intact. Subtle oblique nondisplaced distal right fibular fracture. No additional fracture or dislocation seen. Plantar calcaneal spur.  IMPRESSION: Nondisplaced oblique distal right fibular fracture. Osseous demineralization.  Original Report Authenticated By: Lollie Marrow, M.D.     Medications:     Scheduled Medications:    . digoxin  0.0625 mg Oral Daily  . enoxaparin  30 mg Subcutaneous Q24H  . glipiZIDE  5 mg Oral QAC breakfast  . insulin glargine  20 Units Subcutaneous QHS  . metoprolol succinate  50 mg Oral Daily  . pantoprazole  80 mg Oral Q1200  . simvastatin  40 mg Oral Daily  . sodium chloride  3 mL Intravenous Q12H  . spironolactone  12.5 mg Oral Daily  . Tamsulosin HCl  0.4 mg Oral QHS  . DISCONTD: digoxin  125 mcg Oral Daily  . DISCONTD: metolazone  2.5 mg Oral Q7  days  . DISCONTD: spironolactone  25 mg Oral Daily  . DISCONTD: torsemide  20 mg Oral Daily    Infusions:    PRN Medications: sodium chloride, acetaminophen, ALPRAZolam, morphine injection, ondansetron (ZOFRAN) IV, sodium chloride, tetrahydrozoline, zolpidem   Assessment:   1. Syncope likely orthostatic with R fibula fracture  2. Chronic systolic HF  3. NICM EF 10-15%  4. A/C Renal failure - improving 5. H/o multiple DVTs/PE with IVC filter  --not on coumadin due to recurrent GI bleeds due to AVMs  6. DM2   Plan/Discussion:     I suspect Mr. Basista' syncope is orthostatic in nature but also cannot exclude tachyarrhythmia. He is improved with hydration. No events on tele.  Renal function improving steadily. Can go home today. Will stop weekly metolazone. Continue lasix 80 daily -resume on Tuesday. Spiro cut to 12.5 dig cut back to 0.0625. Repeat echo as outpt. Will need to consider sending to EP for ICD.  F/u HF clinic on Friday (had appt next Monday but will need to move up). F/u ortho for leg.   Length of Stay: 2   Arvilla Meres 09/21/2011, 8:38 AM

## 2011-09-22 ENCOUNTER — Telehealth (HOSPITAL_COMMUNITY): Payer: Self-pay | Admitting: *Deleted

## 2011-09-22 NOTE — Progress Notes (Signed)
   CARE MANAGEMENT NOTE 09/22/2011  Patient:  [name], [name]   Account Number:  [id number]  Date Initiated:  09/21/2011  Documentation initiated by:  [facility]  Subjective/Objective Assessment:   syncope and R fibula fracture likely due to orthostatic hypotension.     Action/Plan:   lives at home alone, has DME at home   Anticipated DC Date:  09/21/2011   Anticipated DC Plan:  HOME W HOME HEALTH SERVICES      DC Planning Services  CM consult      [facility] Choice  HOME HEALTH   Choice offered to / List presented to:  C-1 Patient        HH arranged  HH-2 PT      [facility] agency  [facility]   Status of service:  Completed, signed off Medicare Important Message given?   (If response is "NO", the following Medicare IM given date fields will be blank) Date Medicare IM given:   Date Additional Medicare IM given:    Discharge Disposition:  HOME W HOME HEALTH SERVICES  Per UR Regulation:    If discussed at Long Length of Stay Meetings, dates discussed:    Comments:  09/22/2011 1146 late entry for 5/5 1300 computer issues/system down on 5/5, unable to post note. Isidoro Donning RN CCM Case Mgmt phone [phone]  09/21/2011 1300 Pt states spoke to [employer] rep and states he has a Charity fundraiser coming from [place], that he had them in the past. Wants to continue with [facility]. NCM contacted [facility]. Faxed orders to [facility] for resumption of care and add HH PT. Isidoro Donning RN CCM Case Mgmt  09/21/2011 1215 Contacted [facility] for [facility] PT for scheduled d/c today. States he has RW at home. Isidoro Donning RN CCM Case Mgmt phone [phone]

## 2011-09-22 NOTE — Telephone Encounter (Signed)
Ethelda Chick from [facility] called with Mr Zietlow, she needs to go over Mr Woodrick d/c papers with you. Please call her back at [phone].

## 2011-09-22 NOTE — Telephone Encounter (Signed)
Spoke w/Carol, advised pt needs to continue to hold meds as directed at d/c and we will contact him to schedule a f/u appt for later this week

## 2011-09-23 ENCOUNTER — Telehealth (HOSPITAL_COMMUNITY): Payer: Self-pay | Admitting: *Deleted

## 2011-09-23 NOTE — Telephone Encounter (Signed)
[name] called today.  He made a follow up appt for Thursday, but needs help at home with chores, since breaking his right ankle.  He has no one and will need a referral for the services.  Please give him a call. Thanks.

## 2011-09-24 NOTE — Telephone Encounter (Signed)
Left mess for pt that he has [facility] to f/u with them

## 2011-09-25 ENCOUNTER — Encounter (HOSPITAL_COMMUNITY): Payer: PRIVATE HEALTH INSURANCE

## 2011-09-26 ENCOUNTER — Ambulatory Visit (HOSPITAL_COMMUNITY)
Admission: RE | Admit: 2011-09-26 | Discharge: 2011-09-26 | Disposition: A | Discharge status: Home / Self Care | Payer: PRIVATE HEALTH INSURANCE | Source: Ambulatory Visit | Attending: Internal Medicine | Admitting: Internal Medicine

## 2011-09-26 VITALS — BP 140/78 | HR 111 | Wt 172.5 lb

## 2011-09-26 DIAGNOSIS — Z01810 Encounter for preprocedural cardiovascular examination: Secondary | ICD-10-CM

## 2011-09-26 DIAGNOSIS — I10 Essential (primary) hypertension: Secondary | ICD-10-CM | POA: Insufficient documentation

## 2011-09-26 DIAGNOSIS — N189 Chronic kidney disease, unspecified: Secondary | ICD-10-CM

## 2011-09-26 DIAGNOSIS — F17200 Nicotine dependence, unspecified, uncomplicated: Secondary | ICD-10-CM | POA: Insufficient documentation

## 2011-09-26 DIAGNOSIS — I5022 Chronic systolic (congestive) heart failure: Secondary | ICD-10-CM

## 2011-09-26 DIAGNOSIS — I428 Other cardiomyopathies: Secondary | ICD-10-CM | POA: Insufficient documentation

## 2011-09-26 DIAGNOSIS — N182 Chronic kidney disease, stage 2 (mild): Secondary | ICD-10-CM | POA: Insufficient documentation

## 2011-09-26 DIAGNOSIS — M545 Low back pain, unspecified: Secondary | ICD-10-CM | POA: Insufficient documentation

## 2011-09-26 DIAGNOSIS — N179 Acute kidney failure, unspecified: Secondary | ICD-10-CM

## 2011-09-26 DIAGNOSIS — Z794 Long term (current) use of insulin: Secondary | ICD-10-CM | POA: Insufficient documentation

## 2011-09-26 DIAGNOSIS — K922 Gastrointestinal hemorrhage, unspecified: Secondary | ICD-10-CM | POA: Insufficient documentation

## 2011-09-26 DIAGNOSIS — K219 Gastro-esophageal reflux disease without esophagitis: Secondary | ICD-10-CM | POA: Insufficient documentation

## 2011-09-26 DIAGNOSIS — I129 Hypertensive chronic kidney disease with stage 1 through stage 4 chronic kidney disease, or unspecified chronic kidney disease: Secondary | ICD-10-CM | POA: Insufficient documentation

## 2011-09-26 DIAGNOSIS — Z86718 Personal history of other venous thrombosis and embolism: Secondary | ICD-10-CM | POA: Insufficient documentation

## 2011-09-26 DIAGNOSIS — D649 Anemia, unspecified: Secondary | ICD-10-CM | POA: Insufficient documentation

## 2011-09-26 DIAGNOSIS — G8929 Other chronic pain: Secondary | ICD-10-CM | POA: Insufficient documentation

## 2011-09-26 DIAGNOSIS — N400 Enlarged prostate without lower urinary tract symptoms: Secondary | ICD-10-CM | POA: Insufficient documentation

## 2011-09-26 DIAGNOSIS — Q2733 Arteriovenous malformation of digestive system vessel: Secondary | ICD-10-CM | POA: Insufficient documentation

## 2011-09-26 DIAGNOSIS — I509 Heart failure, unspecified: Secondary | ICD-10-CM | POA: Insufficient documentation

## 2011-09-26 LAB — BASIC METABOLIC PANEL
CO2: 28 mEq/L (ref 19–32)
Calcium: 9.7 mg/dL (ref 8.4–10.5)
Chloride: 103 mEq/L (ref 96–112)
Glucose, Bld: 119 mg/dL — ABNORMAL HIGH (ref 70–99)
Potassium: 4.5 mEq/L (ref 3.5–5.1)
Sodium: 139 mEq/L (ref 135–145)

## 2011-09-26 MED ORDER — LISINOPRIL 10 MG PO TABS: 10.0000 mg | ORAL_TABLET | Freq: Every day | ORAL | Status: DC

## 2011-09-26 NOTE — Assessment & Plan Note (Signed)
He is at moderate, but acceptable, risk for peri-op CV complications. Ok to proceed. Watch volume status closely.

## 2011-09-26 NOTE — Patient Instructions (Signed)
Start Lisinopril 10 mg daily  Labs in 1 week with [facility]  You have been referred to EP at [facility] to discuss getting an ICD  Your physician recommends that you schedule a follow-up appointment in: 3 weeks

## 2011-09-26 NOTE — Assessment & Plan Note (Addendum)
Weight creeping back up. He has restarted demadex today. Will continue to hold metolazone. Will check BMET today. If Cr < 1.5 start lisinopril 10 daily. Will refer to EP. Discussed role of ICD at length. At some point may need to consider VAD but not sure he will qualify given social challenges and need for coumadin.   CR 1.5 will start lisinopril 10. Watch K and renal function closely.

## 2011-09-26 NOTE — Progress Notes (Signed)
Patient ID: [name], male   DOB: 1945/08/07, 66 y.o.   MRN: [id number] HPI:  [name] is a 66 year old male with chronic systolic CHF secondary to NICM echo EF 10-15% by echo 04/2011 (previously 30-35% 06/2010), HTN, anemia, chronic renal insufficiency Cr 1.31,  UGI bleed,  duodenal AVMs,  rectal bleeding 04/2011,  DVTs (2004, 2005, 2007, 2010, 2011) however coumadin on hold due to GI bleed, IVC filter 2005, GERD, BPD, and chronic low back .   05/26/11 Potassium 4.0 Creatinine 1.25 06/10/11 Potassium 4.8 Creatinine 1.21  ProBNP 1311  Discharged 2/6 Weight 159 pounds.   RHC 2/13 RA = 4  RV = 32/6 (7)  PA = 45/15 (24)  PCW = 7  Fick cardiac output/index = 5.2/2.7  Thermo = 4.0/2.1  PVR = 3.4 Woods  FA sat = 94%  PA sat = 56%, 57%  Noninvasive BP : 125/92 (72)  Calculated SVR = 1040  Spironolactone switched to eplerenone due to gynecomastia.  08/13/11 CPX  RER 1.16 Peak VO2 18.6 ml/kg/min Predicted 61.4% VE/VCO2 slope 42.8  Admitted last week (5/3) with syncopal episode, orthostasis and renal failure. With syncope had R fibular fx. Pending surgery on Monday. Hydral/NTG stopped.  We held torsemide for a few days and stopped metolazone. Cr came down from 3.0 -> 1.9 He just restarted demadex today. Feels better. Ankle hurts. Weight at home 162-163. No further dizziness.    ROS: All systems negative except as listed in HPI, PMH and Problem List.  Past Medical History  Diagnosis Date  . Venous thromboembolism     DVTs in 2004, 2005, 2007, 8/10, 8/11. IVC filter 2005. Off coumadin 04/2011 secondary to rectal bleeding  . 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 Chronic venous insufficiency   . DM2 (diabetes mellitus, type 2)   . GERD (gastroesophageal reflux disease)   . BPH (benign prostatic hypertrophy)   . Low back pain   . HTN (hypertension)   . HLD (hyperlipidemia)   . NICM (nonischemic cardiomyopathy) 1. 9/11  2. 2/12    1. Echo septal and apical  akinesis, dilated LV, EF 45%, RV normal size and systolic function, EF 43% on Myoview  2. Admitted with decompensated CHF, echo EF 30-35% with diffuse hypokinesis but akinesis of mid to apical anteroseptal wall and apex, moderate LVH, mild MR, grade I diastolic dysfunction, SPEP/UP negative and HIV negative. Most recent EF 10-15% 04/2011  . Smoking   . CAD (coronary artery disease) 1. 2007  2. 10/11    1. Left heart cath with 40-50% stenosis in small RCA EF 50%  2. Lexiscan myoview EF 43%, global hypokinesis, possible small area of apical ischemia; LHC no angiographic CAD, no LV-gram done due to CKD   2. No angiographic CAD by cath 02/2010  . CKD (chronic kidney disease) stage 2, GFR 60-89 ml/min as of 02/2011  . Headache   . Diabetes mellitus   . Blood transfusion   . Anemia   . Syncope     1. in setting of presumed orthostatic Hypotn 09/2011  . Right fibular fracture     1. 09/2011 - resulting from fall/syncope    Current Outpatient Prescriptions  Medication Sig Dispense Refill  . atorvastatin (LIPITOR) 20 MG tablet Take 20 mg by mouth every morning.       . digoxin (LANOXIN) 0.125 MG tablet Take 0.5 tablets (62.5 mcg total) by mouth daily.  30 tablet  3  .  eplerenone (INSPRA) 25 MG tablet Take 0.5 tablets (12.5 mg total) by mouth daily.  30 tablet  3  . esomeprazole (NEXIUM) 40 MG capsule Take 40 mg by mouth daily before breakfast.      . glipiZIDE (GLUCOTROL XL) 5 MG 24 hr tablet Take 5 mg by mouth daily.        Marland Kitchen HYDROcodone-acetaminophen (VICODIN) 5-500 MG per tablet Take 1 tablet by mouth every 6 (six) hours as needed for pain.  30 tablet  0  . insulin glargine (LANTUS) 100 UNIT/ML injection Inject 20 Units into the skin at bedtime.       . metoprolol succinate (TOPROL-XL) 50 MG 24 hr tablet Take 50 mg by mouth daily. Take with or immediately following a meal.      . naphazoline (CLEAR EYES) 0.012 % ophthalmic solution Place 2 drops into both eyes at bedtime as needed. For dry/itchy  eyes      . Tamsulosin HCl (FLOMAX) 0.4 MG CAPS Take 1 capsule (0.4 mg total) by mouth at bedtime.  30 capsule  6  . torsemide (DEMADEX) 20 MG tablet Take 1 tablet (20 mg total) by mouth daily.         PHYSICAL EXAM: Filed Vitals:   09/26/11 1028  BP: 140/78  Pulse: 111  Weight: 172 lb 8 oz (78.245 kg)  SpO2: 97%  Weight 164 reported by patient.   General: Comfortable. No distress.  HEENT: normal except for poor dentition Neck: supple. JVP 6-7 . Carotids 2+ bilaterally; no bruits. No lymphadenopathy or thryomegaly appreciated. Cor: PMI displaced laterally. Tachycardic, regular rhythm.  Lungs: clear Abdomen: soft, NT. Good bowel sounds Extremities: no cyanosis, clubbing, rash,  lower extremity edema 1+ Neuro: alert & orientedx3, cranial nerves grossly intact. Moves all 4 extremities w/o difficulty. Affect pleasant.    ASSESSMENT & PLAN:

## 2011-09-29 ENCOUNTER — Telehealth (HOSPITAL_COMMUNITY): Payer: Self-pay | Admitting: *Deleted

## 2011-09-29 NOTE — Telephone Encounter (Signed)
Ov note from 5/9 faxed to them at [phone]

## 2011-09-29 NOTE — Telephone Encounter (Signed)
Sherri from Dr Ivin Booty Landau's office called today in regards to Mr Dyas ankle surgery.  They need medical clearance forms filled out for them to proceed with the surgery.  They are faxing the forms to [phone].  The office number is [phone] ext [phone]. Thanks.

## 2011-10-02 ENCOUNTER — Ambulatory Visit (HOSPITAL_COMMUNITY): Payer: PRIVATE HEALTH INSURANCE

## 2011-10-04 ENCOUNTER — Telehealth: Payer: Self-pay | Admitting: Physician Assistant

## 2011-10-04 NOTE — Telephone Encounter (Signed)
[name] with [facility] called. Patient c/o dizziness all week. Urine dark and yellow. BP 118/80 lying and 90/70 standing. No shortness of breath.  No DOE.  No CP. Lungs clear on exam. No edema. Not weighing due to ankle fx. No orthopnea, PND.  Advised RN to have patient hold Imdur and Hydralazine (only taking Hydralazine 25 mg ONCE DAILY). Also, decrease Eplerenone from 25 mg to 12.5 mg QD and Demadex from 20 mg to 10 mg Daily. Ms. Kyung Rudd will draw a BMET today and have it sent to Dr. Arvilla Meres in CHF clinic.  Patient advised to call Monday to speak with the RN for further instructions.  Patient advised to monitor for increased dyspnea or edema.  In that case, he will take a whole Demadex.  Tereso Newcomer, PA-C  6:30 PM 10/04/2011

## 2011-10-05 NOTE — Telephone Encounter (Signed)
See Scott's note. Lets touch base with Mr. Ruan on Monday. We will need to see BMET. Thanks

## 2011-10-05 NOTE — Telephone Encounter (Signed)
RN called back last night. She could not get BMET drawn and patient would not let her draw again. Needs BMET drawn on Monday. Tereso Newcomer, PA-C  1:54 PM 10/05/2011

## 2011-10-06 NOTE — Telephone Encounter (Signed)
Pt states he is not able to come out of his home, have spoken with [facility] and they will try to send one out

## 2011-10-06 NOTE — Telephone Encounter (Signed)
Spoke w/[facility] nurse she states she called pt and he refused to let them come out today but she will go tomorrow, Dr Gala Romney is aware

## 2011-10-07 ENCOUNTER — Telehealth (HOSPITAL_COMMUNITY): Payer: Self-pay | Admitting: *Deleted

## 2011-10-07 NOTE — Telephone Encounter (Signed)
Please have him come to the office this week

## 2011-10-07 NOTE — Telephone Encounter (Signed)
[name] called today. Mr [name] is refusing to have his labs drawn and is only taking certain medications, not taking them all.  Please call her back. Thanks.

## 2011-10-07 NOTE — Telephone Encounter (Signed)
Spoke w/Chris she states when she went to pt's home today he refused to let her draw labs and then stated he is only going to take 3 meds (nexium, glipizide and metoprolol) he is not taking any other meds.  She states BP 130/80 HR 68 O2 98% lungs clear slight edema in leg w/cast.  Will let Dr Gala Romney know

## 2011-10-08 ENCOUNTER — Encounter (HOSPITAL_COMMUNITY): Payer: PRIVATE HEALTH INSURANCE

## 2011-10-08 ENCOUNTER — Encounter (HOSPITAL_COMMUNITY): Payer: Self-pay | Admitting: Pharmacy Technician

## 2011-10-08 NOTE — Progress Notes (Signed)
Reviewed all recent notes with dr Gala Romney  Dr Frederick-pt needs to be done main or due to poor cardiac function- Cordelia Pen at dr Dion Saucier office notified

## 2011-10-08 NOTE — Telephone Encounter (Signed)
Pt coming tomorrow 5-23 at 930 am

## 2011-10-09 ENCOUNTER — Other Ambulatory Visit: Payer: Self-pay | Admitting: Orthopedic Surgery

## 2011-10-09 ENCOUNTER — Encounter (HOSPITAL_COMMUNITY): Payer: Self-pay

## 2011-10-09 ENCOUNTER — Ambulatory Visit (HOSPITAL_BASED_OUTPATIENT_CLINIC_OR_DEPARTMENT_OTHER)
Admission: RE | Admit: 2011-10-09 | Discharge: 2011-10-09 | Disposition: A | Discharge status: Home / Self Care | Payer: PRIVATE HEALTH INSURANCE | Source: Ambulatory Visit | Attending: Internal Medicine | Admitting: Internal Medicine

## 2011-10-09 VITALS — BP 98/76 | HR 62 | Ht 72.0 in | Wt 169.4 lb

## 2011-10-09 DIAGNOSIS — I5022 Chronic systolic (congestive) heart failure: Secondary | ICD-10-CM

## 2011-10-09 NOTE — Progress Notes (Addendum)
HPI:   [name] is a 66 year old male with chronic systolic CHF secondary to NICM echo EF 10-15% by echo 04/2011 (previously 30-35% 06/2010), HTN, anemia, chronic renal insufficiency Cr 1.31,  UGI bleed,  duodenal AVMs,  rectal bleeding 04/2011,  DVTs (2004, 2005, 2007, 2010, 2011) however coumadin on hold due to GI bleed, IVC filter 2005, GERD, BPD, and chronic low back .   Discharged 2/6 Weight 159 pounds.   RHC 2/13 RA = 4  RV = 32/6 (7)  PA = 45/15 (24)  PCW = 7  Fick cardiac output/index = 5.2/2.7  Thermo = 4.0/2.1  PVR = 3.4 Woods  FA sat = 94%  PA sat = 56%, 57%  Noninvasive BP : 125/92 (72)  Calculated SVR = 1040  Spironolactone switched to eplerenone due to gynecomastia.  08/13/11 CPX  RER 1.16 Peak VO2 18.6 ml/kg/min Predicted 61.4% VE/VCO2 slope 42.8  Admitted in early May 2013 for syncope and acute renal failure in setting of volume depletion. Fall complicated by lower leg fracture.   He is here for a work in visit.  Due to dizziness and dark urine.  He was told to stop his hydralazine/imdur and hold demadex.  He feels much better since stopping these meds.  He says his dark urine was due to sitting out all day waiting on the the nurse to look at it.  His weight is a steady 162-163 pounds without demadex.  He is having ankle surgery tomorrow at 830.  He denies SOB/orthopnea/PND.      ROS: All systems negative except as listed in HPI, PMH and Problem List.  Past Medical History  Diagnosis Date  . Venous thromboembolism     DVTs in 2004, 2005, 2007, 8/10, 8/11. IVC filter 2005. Off coumadin 04/2011 secondary to rectal bleeding  . 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 Chronic venous insufficiency   . DM2 (diabetes mellitus, type 2)   . GERD (gastroesophageal reflux disease)   . BPH (benign prostatic hypertrophy)   . Low back pain   . HTN (hypertension)   . HLD (hyperlipidemia)   . NICM (nonischemic cardiomyopathy) 1. 9/11  2. 2/12    1.  Echo septal and apical akinesis, dilated LV, EF 45%, RV normal size and systolic function, EF 43% on Myoview  2. Admitted with decompensated CHF, echo EF 30-35% with diffuse hypokinesis but akinesis of mid to apical anteroseptal wall and apex, moderate LVH, mild MR, grade I diastolic dysfunction, SPEP/UP negative and HIV negative. Most recent EF 10-15% 04/2011  . Smoking   . CAD (coronary artery disease) 1. 2007  2. 10/11    1. Left heart cath with 40-50% stenosis in small RCA EF 50%  2. Lexiscan myoview EF 43%, global hypokinesis, possible small area of apical ischemia; LHC no angiographic CAD, no LV-gram done due to CKD   2. No angiographic CAD by cath 02/2010  . CKD (chronic kidney disease) stage 2, GFR 60-89 ml/min as of 02/2011  . Headache   . Diabetes mellitus   . Blood transfusion   . Anemia   . Syncope     1. in setting of presumed orthostatic Hypotn 09/2011  . Right fibular fracture     1. 09/2011 - resulting from fall/syncope    Current Outpatient Prescriptions  Medication Sig Dispense Refill  . atorvastatin (LIPITOR) 20 MG tablet Take 20 mg by mouth every morning.       Marland Kitchen  digoxin (LANOXIN) 0.125 MG tablet Take 125 mcg by mouth daily.       Marland Kitchen esomeprazole (NEXIUM) 40 MG capsule Take 40 mg by mouth daily before breakfast.      . glipiZIDE (GLUCOTROL XL) 5 MG 24 hr tablet Take 5 mg by mouth daily.        . insulin glargine (LANTUS) 100 UNIT/ML injection Inject 20 Units into the skin at bedtime.       . metoprolol succinate (TOPROL-XL) 50 MG 24 hr tablet Take 50 mg by mouth daily. Take with or immediately following a meal.      . Tamsulosin HCl (FLOMAX) 0.4 MG CAPS Take 1 capsule (0.4 mg total) by mouth at bedtime.  30 capsule  6  . eplerenone (INSPRA) 25 MG tablet Take 25 mg by mouth daily.       Not taking eplerenone.     PHYSICAL EXAM: Filed Vitals:   10/09/11 1541  BP: 98/76  Pulse: 62  Height: 6' (1.829 m)  Weight: 169 lb 6.4 oz (76.839 kg)    General: Comfortable. No  acute distress.  HEENT: normal except for poor dentition Neck: supple. JVP flat . Carotids 2+ bilaterally; no bruits. No lymphadenopathy or thryomegaly appreciated. Cor: PMI displaced laterally. regular rate and rhythm.  Lungs: clear Abdomen: soft, NT. Good bowel sounds Extremities: no cyanosis, clubbing, rash, no edema.  Right leg in cast Neuro: alert & orientedx3, cranial nerves grossly intact. Moves all 4 extremities w/o difficulty. Affect pleasant.    ASSESSMENT & PLAN:

## 2011-10-09 NOTE — Assessment & Plan Note (Addendum)
Volume status looks good today.   Agree with holding torsemide and eplerenone as patient appears to have been over diuresed.  Will also continue with discontinuation of hydralazine/imdur at this time due to recent hypotension.  Have discussed use of sliding scale torsemide when weight increases 2-3 pounds in 24 hours.  He voiced understanding.  With his ankle surgery tomorrow will continue current treatments (toprol and digoxin), hopefully will be able to add back hydralazine/imdur at later follow ups.    Patient seen and examined with Ulyess Blossom, PA-C. We discussed all aspects of the encounter. I agree with the assessment and plan as stated above. He appears dry. Agree with holding diuretics until weight starts going back up. Reinforced need for daily weights and reviewed use of sliding scale diuretics. Will also hold hydral/NTG but hopefully we can resume these soon. He is OK to proceed with ankle surgery today.

## 2011-10-09 NOTE — Patient Instructions (Signed)
Take demadex 20 mg daily when your weight goes up 2-3 pounds in 24 hours.    Continue digoxin 125 mcg daily and Toprol 50 mg daily

## 2011-10-10 ENCOUNTER — Encounter (HOSPITAL_COMMUNITY): Payer: Self-pay | Admitting: Anesthesiology

## 2011-10-10 ENCOUNTER — Ambulatory Visit (HOSPITAL_COMMUNITY)
Admission: RE | Admit: 2011-10-10 | Discharge: 2011-10-10 | Disposition: A | Discharge status: Home / Self Care | Payer: PRIVATE HEALTH INSURANCE | Source: Ambulatory Visit | Attending: Orthopedic Surgery | Admitting: Orthopedic Surgery

## 2011-10-10 ENCOUNTER — Encounter (HOSPITAL_COMMUNITY)
Admission: RE | Disposition: A | Discharge status: Home / Self Care | Payer: Self-pay | Source: Ambulatory Visit | Attending: Orthopedic Surgery

## 2011-10-10 ENCOUNTER — Encounter (HOSPITAL_COMMUNITY): Payer: Self-pay | Admitting: Surgery

## 2011-10-10 ENCOUNTER — Encounter (HOSPITAL_BASED_OUTPATIENT_CLINIC_OR_DEPARTMENT_OTHER): Admission: RE | Payer: Self-pay | Source: Ambulatory Visit

## 2011-10-10 ENCOUNTER — Encounter (HOSPITAL_COMMUNITY): Payer: Self-pay | Admitting: Orthopedic Surgery

## 2011-10-10 ENCOUNTER — Ambulatory Visit (HOSPITAL_BASED_OUTPATIENT_CLINIC_OR_DEPARTMENT_OTHER)
Admission: RE | Admit: 2011-10-10 | Payer: PRIVATE HEALTH INSURANCE | Source: Ambulatory Visit | Admitting: Orthopedic Surgery

## 2011-10-10 ENCOUNTER — Ambulatory Visit (HOSPITAL_COMMUNITY): Payer: PRIVATE HEALTH INSURANCE | Admitting: Anesthesiology

## 2011-10-10 DIAGNOSIS — N182 Chronic kidney disease, stage 2 (mild): Secondary | ICD-10-CM | POA: Insufficient documentation

## 2011-10-10 DIAGNOSIS — I872 Venous insufficiency (chronic) (peripheral): Secondary | ICD-10-CM | POA: Insufficient documentation

## 2011-10-10 DIAGNOSIS — X58XXXA Exposure to other specified factors, initial encounter: Secondary | ICD-10-CM | POA: Insufficient documentation

## 2011-10-10 DIAGNOSIS — S82891A Other fracture of right lower leg, initial encounter for closed fracture: Secondary | ICD-10-CM

## 2011-10-10 DIAGNOSIS — I129 Hypertensive chronic kidney disease with stage 1 through stage 4 chronic kidney disease, or unspecified chronic kidney disease: Secondary | ICD-10-CM | POA: Insufficient documentation

## 2011-10-10 DIAGNOSIS — S82899A Other fracture of unspecified lower leg, initial encounter for closed fracture: Secondary | ICD-10-CM | POA: Insufficient documentation

## 2011-10-10 DIAGNOSIS — E785 Hyperlipidemia, unspecified: Secondary | ICD-10-CM | POA: Insufficient documentation

## 2011-10-10 DIAGNOSIS — Z86718 Personal history of other venous thrombosis and embolism: Secondary | ICD-10-CM | POA: Insufficient documentation

## 2011-10-10 DIAGNOSIS — S82401A Unspecified fracture of shaft of right fibula, initial encounter for closed fracture: Secondary | ICD-10-CM

## 2011-10-10 DIAGNOSIS — K219 Gastro-esophageal reflux disease without esophagitis: Secondary | ICD-10-CM | POA: Insufficient documentation

## 2011-10-10 DIAGNOSIS — E119 Type 2 diabetes mellitus without complications: Secondary | ICD-10-CM | POA: Insufficient documentation

## 2011-10-10 DIAGNOSIS — I428 Other cardiomyopathies: Secondary | ICD-10-CM | POA: Insufficient documentation

## 2011-10-10 HISTORY — PX: ORIF ANKLE FRACTURE: SHX5408

## 2011-10-10 HISTORY — DX: Other fracture of right lower leg, initial encounter for closed fracture: S82.891A

## 2011-10-10 LAB — GLUCOSE, CAPILLARY: Glucose-Capillary: 109 mg/dL — ABNORMAL HIGH (ref 70–99)

## 2011-10-10 SURGERY — OPEN REDUCTION INTERNAL FIXATION (ORIF) ANKLE FRACTURE
Anesthesia: General | Site: Ankle | Laterality: Right | Wound class: Clean

## 2011-10-10 SURGERY — OPEN REDUCTION INTERNAL FIXATION (ORIF) ANKLE FRACTURE
Anesthesia: Choice | Site: Ankle | Laterality: Right

## 2011-10-10 MED ORDER — ONDANSETRON HCL 4 MG/2ML IJ SOLN: 4.0000 mg | Freq: Once | INTRAMUSCULAR | Status: DC | PRN

## 2011-10-10 MED ORDER — HYDROMORPHONE HCL PF 1 MG/ML IJ SOLN: 0.2500 mg | INTRAMUSCULAR | Status: DC | PRN

## 2011-10-10 MED ORDER — CEFAZOLIN SODIUM 1-5 GM-% IV SOLN
INTRAVENOUS | Status: AC
  Filled 2011-10-10: qty 50

## 2011-10-10 MED ORDER — MUPIROCIN 2 % EX OINT
TOPICAL_OINTMENT | CUTANEOUS | Status: AC
  Filled 2011-10-10: qty 22

## 2011-10-10 MED ORDER — FENTANYL CITRATE 0.05 MG/ML IJ SOLN
INTRAMUSCULAR | Status: DC | PRN
  Administered 2011-10-10 (×6): 50 ug via INTRAVENOUS

## 2011-10-10 MED ORDER — BUPIVACAINE HCL (PF) 0.25 % IJ SOLN
INTRAMUSCULAR | Status: DC | PRN
  Administered 2011-10-10: 20 mL

## 2011-10-10 MED ORDER — PROMETHAZINE HCL 25 MG PO TABS: 25.0000 mg | ORAL_TABLET | Freq: Four times a day (QID) | ORAL | Status: DC | PRN

## 2011-10-10 MED ORDER — CEFAZOLIN SODIUM 1-5 GM-% IV SOLN
1.0000 g | INTRAVENOUS | Status: AC
  Administered 2011-10-10: 1 g via INTRAVENOUS

## 2011-10-10 MED ORDER — BUPIVACAINE-EPINEPHRINE PF 0.5-1:200000 % IJ SOLN
INTRAMUSCULAR | Status: DC | PRN
  Administered 2011-10-10: 25 mL

## 2011-10-10 MED ORDER — OXYCODONE-ACETAMINOPHEN 10-325 MG PO TABS: 1.0000 | ORAL_TABLET | Freq: Four times a day (QID) | ORAL | Status: AC | PRN

## 2011-10-10 MED ORDER — LIDOCAINE HCL (CARDIAC) 20 MG/ML IV SOLN
INTRAVENOUS | Status: DC | PRN
  Administered 2011-10-10: 90 mg via INTRAVENOUS

## 2011-10-10 MED ORDER — MUPIROCIN 2 % EX OINT: TOPICAL_OINTMENT | Freq: Two times a day (BID) | CUTANEOUS | Status: DC

## 2011-10-10 MED ORDER — PROPOFOL 10 MG/ML IV EMUL
INTRAVENOUS | Status: DC | PRN
  Administered 2011-10-10: 100 mg via INTRAVENOUS

## 2011-10-10 MED ORDER — ONDANSETRON HCL 4 MG/2ML IJ SOLN
INTRAMUSCULAR | Status: DC | PRN
  Administered 2011-10-10: 4 mg via INTRAVENOUS

## 2011-10-10 MED ORDER — MIDAZOLAM HCL 5 MG/5ML IJ SOLN
INTRAMUSCULAR | Status: DC | PRN
  Administered 2011-10-10 (×2): 1 mg via INTRAVENOUS

## 2011-10-10 MED ORDER — 0.9 % SODIUM CHLORIDE (POUR BTL) OPTIME
TOPICAL | Status: DC | PRN
  Administered 2011-10-10: 1000 mL

## 2011-10-10 MED ORDER — METOPROLOL SUCCINATE ER 50 MG PO TB24
50.0000 mg | ORAL_TABLET | ORAL | Status: AC
  Administered 2011-10-10: 50 mg via ORAL
  Filled 2011-10-10: qty 1

## 2011-10-10 MED ORDER — LACTATED RINGERS IV SOLN
INTRAVENOUS | Status: DC | PRN
  Administered 2011-10-10 (×2): via INTRAVENOUS

## 2011-10-10 SURGICAL SUPPLY — 61 items
APL SKNCLS STERI-STRIP NONHPOA (GAUZE/BANDAGES/DRESSINGS) ×1
BANDAGE ELASTIC 4 VELCRO ST LF (GAUZE/BANDAGES/DRESSINGS) ×1 IMPLANT
BANDAGE ELASTIC 6 VELCRO ST LF (GAUZE/BANDAGES/DRESSINGS) ×3 IMPLANT
BANDAGE ESMARK 6X9 LF (GAUZE/BANDAGES/DRESSINGS) IMPLANT
BANDAGE GAUZE ELAST BULKY 4 IN (GAUZE/BANDAGES/DRESSINGS) ×1 IMPLANT
BENZOIN TINCTURE PRP APPL 2/3 (GAUZE/BANDAGES/DRESSINGS) ×1 IMPLANT
BIT DRILL 2.5X110 QC LCP DISP (BIT) ×1 IMPLANT
BIT DRILL 2.8 (BIT) ×1
BIT DRILL CANN QC 2.8X165 (BIT) IMPLANT
BNDG CMPR 9X6 STRL LF SNTH (GAUZE/BANDAGES/DRESSINGS)
BNDG ESMARK 6X9 LF (GAUZE/BANDAGES/DRESSINGS)
BOOTCOVER CLEANROOM LRG (PROTECTIVE WEAR) ×4 IMPLANT
CLOTH BEACON ORANGE TIMEOUT ST (SAFETY) ×2 IMPLANT
CLSR STERI-STRIP ANTIMIC 1/2X4 (GAUZE/BANDAGES/DRESSINGS) ×1 IMPLANT
COVER SURGICAL LIGHT HANDLE (MISCELLANEOUS) ×2 IMPLANT
CUFF TOURNIQUET SINGLE 34IN LL (TOURNIQUET CUFF) ×1 IMPLANT
CUFF TOURNIQUET SINGLE 44IN (TOURNIQUET CUFF) IMPLANT
DECANTER SPIKE VIAL GLASS SM (MISCELLANEOUS) IMPLANT
DRAPE C-ARM 42X72 X-RAY (DRAPES) IMPLANT
DRAPE OEC MINIVIEW 54X84 (DRAPES) ×1 IMPLANT
DRAPE U-SHAPE 47X51 STRL (DRAPES) IMPLANT
DRILL BIT 2.8MM (BIT) ×2
DRSG PAD ABDOMINAL 8X10 ST (GAUZE/BANDAGES/DRESSINGS) ×2 IMPLANT
DURAPREP 26ML APPLICATOR (WOUND CARE) ×2 IMPLANT
ELECT REM PT RETURN 9FT ADLT (ELECTROSURGICAL) ×2
ELECTRODE REM PT RTRN 9FT ADLT (ELECTROSURGICAL) ×1 IMPLANT
GAUZE XEROFORM 1X8 LF (GAUZE/BANDAGES/DRESSINGS) ×4 IMPLANT
GLOVE BIOGEL PI IND STRL 8 (GLOVE) ×1 IMPLANT
GLOVE BIOGEL PI INDICATOR 8 (GLOVE) ×1
GLOVE ORTHO TXT STRL SZ7.5 (GLOVE) ×2 IMPLANT
GLOVE SURG ORTHO 8.0 STRL STRW (GLOVE) ×4 IMPLANT
GOWN STRL REIN XL XLG (GOWN DISPOSABLE) ×2 IMPLANT
KIT BASIN OR (CUSTOM PROCEDURE TRAY) ×2 IMPLANT
KIT ROOM TURNOVER OR (KITS) ×2 IMPLANT
MANIFOLD NEPTUNE II (INSTRUMENTS) ×2 IMPLANT
NS IRRIG 1000ML POUR BTL (IV SOLUTION) ×2 IMPLANT
PACK ORTHO EXTREMITY (CUSTOM PROCEDURE TRAY) ×2 IMPLANT
PAD ARMBOARD 7.5X6 YLW CONV (MISCELLANEOUS) ×4 IMPLANT
PAD CAST 4YDX4 CTTN HI CHSV (CAST SUPPLIES) ×2 IMPLANT
PADDING CAST COTTON 4X4 STRL (CAST SUPPLIES) ×4
PLATE LCP 3.5 1/3 TUB 6HX69 (Plate) ×1 IMPLANT
SCREW CANC PT 4.0X20 (Screw) ×1 IMPLANT
SCREW CANC PT/18 4.0 (Screw) ×1 IMPLANT
SCREW CORTEX 3.5 14MM (Screw) ×2 IMPLANT
SCREW CORTEX 3.5 20MM (Screw) ×1 IMPLANT
SCREW LOCK CORT ST 3.5X14 (Screw) IMPLANT
SCREW LOCK CORT ST 3.5X20 (Screw) IMPLANT
SPLINT PLASTER CAST XFAST 5X30 (CAST SUPPLIES) IMPLANT
SPLINT PLASTER XFAST SET 5X30 (CAST SUPPLIES) ×1
SPONGE GAUZE 4X4 12PLY (GAUZE/BANDAGES/DRESSINGS) ×2 IMPLANT
SPONGE LAP 4X18 X RAY DECT (DISPOSABLE) ×4 IMPLANT
STAPLER VISISTAT 35W (STAPLE) ×1 IMPLANT
SUCTION FRAZIER TIP 10 FR DISP (SUCTIONS) ×2 IMPLANT
SUT VIC AB 3-0 SH 8-18 (SUTURE) ×1 IMPLANT
SYR CONTROL 10ML LL (SYRINGE) IMPLANT
TOWEL OR 17X24 6PK STRL BLUE (TOWEL DISPOSABLE) ×1 IMPLANT
TOWEL OR 17X26 10 PK STRL BLUE (TOWEL DISPOSABLE) ×1 IMPLANT
TUBE CONNECTING 12X1/4 (SUCTIONS) ×2 IMPLANT
UNDERPAD 30X30 INCONTINENT (UNDERPADS AND DIAPERS) ×2 IMPLANT
WATER STERILE IRR 1000ML POUR (IV SOLUTION) ×1 IMPLANT
YANKAUER SUCT BULB TIP NO VENT (SUCTIONS) ×1 IMPLANT

## 2011-10-10 NOTE — Addendum Note (Signed)
Addendum  created 10/10/11 1403 by Theodosia Quay, CRNA   Modules edited:Charges VN

## 2011-10-10 NOTE — Discharge Instructions (Signed)
Ankle Fracture A fracture is a break in the bone. A cast or splint is used to protect and keep your injured bone from moving.  HOME CARE INSTRUCTIONS   Use your crutches as directed.   To lessen the swelling, keep the injured leg elevated while sitting or lying down.   Apply ice to the injury for 15 to 20 minutes, 3 to 4 times per day while awake for 2 days. Put the ice in a plastic bag and place a thin towel between the bag of ice and your cast.   If you have a plaster or fiberglass cast:   Do not try to scratch the skin under the cast using sharp or pointed objects.   Check the skin around the cast every day. You may put lotion on any red or sore areas.   Keep your cast dry and clean.   If you have a plaster splint:   Wear the splint as directed.   You may loosen the elastic around the splint if your toes become numb, tingle, or turn cold or blue.   Do not put pressure on any part of your cast or splint; it may break. Rest your cast only on a pillow the first 24 hours until it is fully hardened.   Your cast or splint can be protected during bathing with a plastic bag. Do not lower the cast or splint into water.   Take medications as directed by your caregiver. Only take over-the-counter or prescription medicines for pain, discomfort, or fever as directed by your caregiver.   Do not drive a vehicle until your caregiver specifically tells you it is safe to do so.   If your caregiver has given you a follow-up appointment, it is very important to keep that appointment. Not keeping the appointment could result in a chronic or permanent injury, pain, and disability. If there is any problem keeping the appointment, you must call back to this facility for assistance.  SEEK IMMEDIATE MEDICAL CARE IF:   Your cast gets damaged or breaks.   You have continued severe pain or more swelling than you did before the cast was put on.   Your skin or toenails below the injury turn blue or gray,  or feel cold or numb.   There is a bad smell or new stains and/or purulent (pus like) drainage coming from under the cast.  If you do not have a window in your cast for observing the wound, a discharge or minor bleeding may show up as a stain on the outside of your cast. Report these findings to your caregiver. MAKE SURE YOU:   Understand these instructions.   Will watch your condition.   Will get help right away if you are not doing well or get worse.  Document Released: 05/02/2000 Document Revised: 04/24/2011 Document Reviewed: 12/07/2007 [employer] Patient Information 2012 [employer]. 

## 2011-10-10 NOTE — H&P (Addendum)
PREOPERATIVE H&P  Chief Complaint: Right Ankle Fracture   HPI: [name] is a 66 y.o. male who presents for preoperative history and physical with a diagnosis of Right Ankle Fracture . He has a displaced mortise, with an incongruous ankle, with ongoing pain, and elected for surgical intervention to minimize the risk for posttraumatic arthritis.  Past Medical History  Diagnosis Date  . Venous thromboembolism     DVTs in 2004, 2005, 2007, 8/10, 8/11. IVC filter 2005. Off coumadin 04/2011 secondary to rectal bleeding  . 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 Chronic venous insufficiency   . DM2 (diabetes mellitus, type 2)   . GERD (gastroesophageal reflux disease)   . BPH (benign prostatic hypertrophy)   . Low back pain   . HTN (hypertension)   . HLD (hyperlipidemia)   . NICM (nonischemic cardiomyopathy) 1. 9/11  2. 2/12    1. Echo septal and apical akinesis, dilated LV, EF 45%, RV normal size and systolic function, EF 43% on Myoview  2. Admitted with decompensated CHF, echo EF 30-35% with diffuse hypokinesis but akinesis of mid to apical anteroseptal wall and apex, moderate LVH, mild MR, grade I diastolic dysfunction, SPEP/UP negative and HIV negative. Most recent EF 10-15% 04/2011  . Smoking   . CAD (coronary artery disease) 1. 2007  2. 10/11    1. Left heart cath with 40-50% stenosis in small RCA EF 50%  2. Lexiscan myoview EF 43%, global hypokinesis, possible small area of apical ischemia; LHC no angiographic CAD, no LV-gram done due to CKD   2. No angiographic CAD by cath 02/2010  . CKD (chronic kidney disease) stage 2, GFR 60-89 ml/min as of 02/2011  . Headache   . Diabetes mellitus   . Blood transfusion   . Anemia   . Syncope     1. in setting of presumed orthostatic Hypotn 09/2011  . Right fibular fracture     1. 09/2011 - resulting from fall/syncope  . Closed right ankle fracture 10/10/2011   Past Surgical History  Procedure Date  . Cardiac  catheterization   . Appendectomy   . Cholecystectomy   . Colonoscopy 05/01/2011    Procedure: COLONOSCOPY;  Surgeon: Louis Meckel, MD;  Location: [facility];  Service: Endoscopy;  Laterality: N/A;   History   Social History  . Marital Status: Divorced    Spouse Name: N/A    Number of Children: 1  . Years of Education: N/A   Occupational History  . security guard     Part time   Social History Main Topics  . Smoking status: Current Some Day Smoker -- 1.0 packs/day for 40 years    Types: Cigarettes    Last Attempt to Quit: 06/30/2011  . Smokeless tobacco: Never Used  . Alcohol Use: No  . Drug Use: No  . Sexually Active: None   Other Topics Concern  . None   Social History Narrative   Has 1 son.   Family History  Problem Relation Age of Onset  . Coronary artery disease Neg Hx     Premature   No Known Allergies Prior to Admission medications   Medication Sig Start Date End Date Taking? Authorizing Provider  atorvastatin (LIPITOR) 20 MG tablet Take 20 mg by mouth every morning.    Yes Historical Provider, MD  digoxin (LANOXIN) 0.125 MG tablet Take 125 mcg by mouth daily.  09/21/11  Yes Ok Anis, NP  eplerenone (INSPRA) 25 MG tablet Take 25 mg by mouth daily.  09/21/11  Yes Ok Anis, NP  esomeprazole (NEXIUM) 40 MG capsule Take 40 mg by mouth daily before breakfast.   Yes Dolores Patty, MD  glipiZIDE (GLUCOTROL XL) 5 MG 24 hr tablet Take 5 mg by mouth daily.     Yes Historical Provider, MD  insulin glargine (LANTUS) 100 UNIT/ML injection Inject 20 Units into the skin at bedtime.    Yes Historical Provider, MD  metoprolol succinate (TOPROL-XL) 50 MG 24 hr tablet Take 50 mg by mouth daily. Take with or immediately following a meal.   Yes Amy D Clegg, NP  Tamsulosin HCl (FLOMAX) 0.4 MG CAPS Take 1 capsule (0.4 mg total) by mouth at bedtime. 09/15/11  Yes Dolores Patty, MD     Positive ROS: All other systems have been reviewed and were  otherwise negative with the exception of those mentioned in the HPI and as above.  Physical Exam: General: Alert, no acute distress Cardiovascular: No pedal edema Respiratory: No cyanosis, no use of accessory musculature GI: No organomegaly, abdomen is soft and non-tender Skin: No lesions in the area of chief complaint Neurologic: Sensation intact distally Psychiatric: Patient is competent for consent with normal mood and affect Lymphatic: No axillary or cervical lymphadenopathy  MUSCULOSKELETAL: Right ankle has minimal swelling at this point, but positive pain over the distal fibula.  Assessment: Right Ankle Fracture with multiple coexisting comorbidities including peripheral vascular disease, cardiac disease, history of blood clots, see above past medical history.  Plan: Plan for Procedure(s): OPEN REDUCTION INTERNAL FIXATION (ORIF) ANKLE FRACTURE  The risks benefits and alternatives were discussed with the patient including but not limited to the risks of nonoperative treatment, versus surgical intervention including infection, bleeding, nerve injury,  blood clots, cardiopulmonary complications, morbidity, mortality, among others, and they were willing to proceed. We've also discussed the risks of nonunion, malunion, hardware prominence, the need for hardware removal, posttraumatic arthritis, among others.  Eulas Post, MD 10/10/2011 10:09 AM

## 2011-10-10 NOTE — Preoperative (Signed)
Beta Blockers   Reason not to administer Beta Blockers:Not Applicable 

## 2011-10-10 NOTE — Anesthesia Preprocedure Evaluation (Signed)
Anesthesia Evaluation  Patient identified by MRN, date of birth, ID band Patient awake    Reviewed: Allergy & Precautions, H&P , NPO status , Patient's Chart, lab work & pertinent test results, reviewed documented beta blocker date and time   Airway Mallampati: I TM Distance: >3 FB Neck ROM: Full    Dental  (+) Teeth Intact and Dental Advisory Given   Pulmonary  breath sounds clear to auscultation        Cardiovascular Rhythm:Regular Rate:Normal     Neuro/Psych    GI/Hepatic   Endo/Other    Renal/GU      Musculoskeletal   Abdominal   Peds  Hematology   Anesthesia Other Findings   Reproductive/Obstetrics                           Anesthesia Physical Anesthesia Plan  ASA: IV  Anesthesia Plan: General   Post-op Pain Management:    Induction: Intravenous  Airway Management Planned: LMA  Additional Equipment:   Intra-op Plan:   Post-operative Plan: Extubation in OR  Informed Consent: I have reviewed the patients History and Physical, chart, labs and discussed the procedure including the risks, benefits and alternatives for the proposed anesthesia with the patient or authorized representative who has indicated his/her understanding and acceptance.   Dental advisory given  Plan Discussed with: CRNA, Anesthesiologist and Surgeon  Anesthesia Plan Comments:         Anesthesia Quick Evaluation

## 2011-10-10 NOTE — Op Note (Signed)
10/10/2011  PATIENT:  [name]    PRE-OPERATIVE DIAGNOSIS:  Right Ankle Fracture, distal fibula  POST-OPERATIVE DIAGNOSIS:  Same  PROCEDURE:  OPEN REDUCTION INTERNAL FIXATION (ORIF) ANKLE FRACTURE  SURGEON:  Eulas Post, MD  PHYSICIAN ASSISTANT: Janace Litten, OPA-C, present and scrubbed throughout the case, critical for completion in a timely fashion, and for retraction, instrumentation, and closure.  ANESTHESIA:   General  PREOPERATIVE INDICATIONS:  [name] is a  66 y.o. male with a diagnosis of Right Ankle Fracture  who elected for surgical management to minimize the risk for malunion and nonunion and post-traumatic arthritis.  Preoperative x-rays demonstrated a widened mortise with mild stress testing, as well as shortening of the distal fibula, and incongruity on the lateral of the tibiotalar joint. For these reasons I felt that surgical intervention would minimize the risk of posttraumatic arthritis.  The risks benefits and alternatives were discussed with the patient preoperatively including but not limited to the risks of infection, bleeding, nerve injury, cardiopulmonary complications, the need for revision surgery, the need for hardware removal, among others, and the patient was willing to proceed.  He has multiple complicated medical problems, which delayed his capacity to get to the operating room, as well as the fact he presented to me on a delayed basis, and ultimately his fracture was almost healed at the time of surgical intervention, therefore this was more of a malunion takedown.  OPERATIVE IMPLANTS: Synthes 1/3 tubular plate, with a single interfragmentary lag screw.  OPERATIVE PROCEDURE: The patient was brought to the operating room and placed in the supine position. All bony prominences were padded. General anesthesia was administered. The left lower extremity was prepped and draped in the usual sterile fashion. The leg was elevated and exsanguinated and  the tourniquet was inflated. Time out was performed.   Incision was made over the distal fibula, and the fracture site was exposed, although it was almost completely here. I took a dental pick, as well as a Cobb elevator, and mobilized the fracture, and rongeured out the fibrous interposed tissue. I had to work fairly hard to get the fibula out to length, but ultimately I was able to achieve appropriate length and position. The fracture was reduced anatomically with a clamp. A lag screw was placed. I then applied a 1/3 tubular locking plate and secured it proximally and distally with locking and non-locking screws. Bone quality was mediocre. I used c-arm to confirm satisfactory reduction and fixation.   The syndesmosis was stressed using live fluoroscopy and found to be stable.   The wounds were irrigated, and closed with vicryl with routine closure for the skin. The wounds were injected with local anesthetic. Sterile gauze was applied followed by a posterior splint. He was awakened and returned to the PACU in stable and satisfactory condition. There were no complications.

## 2011-10-10 NOTE — Transfer of Care (Signed)
Immediate Anesthesia Transfer of Care Note  Patient: [name]  Procedure(s) Performed: Procedure(s) (LRB): OPEN REDUCTION INTERNAL FIXATION (ORIF) ANKLE FRACTURE (Right)  Patient Location: PACU  Anesthesia Type: GA combined with regional for post-op pain  Level of Consciousness: awake and alert   Airway & Oxygen Therapy: Patient Spontanous Breathing and Patient connected to face mask oxygen  Post-op Assessment: Report given to PACU RN  Post vital signs: Reviewed and stable  Complications: No apparent anesthesia complications

## 2011-10-10 NOTE — Anesthesia Postprocedure Evaluation (Signed)
  Anesthesia Post-op Note  Patient: [name]  Procedure(s) Performed: Procedure(s) (LRB): OPEN REDUCTION INTERNAL FIXATION (ORIF) ANKLE FRACTURE (Right)  Patient Location: PACU  Anesthesia Type: GA combined with regional for post-op pain  Level of Consciousness: awake, alert  and oriented  Airway and Oxygen Therapy: Patient Spontanous Breathing  Post-op Pain: mild  Post-op Assessment: Post-op Vital signs reviewed  Post-op Vital Signs: Reviewed  Complications: No apparent anesthesia complications

## 2011-10-10 NOTE — Anesthesia Procedure Notes (Signed)
Anesthesia Regional Block:  Popliteal block  Pre-Anesthetic Checklist: ,, timeout performed, Correct Patient, Correct Site, Correct Laterality, Correct Procedure, Correct Position, site marked, Risks and benefits discussed,  Surgical consent,  Pre-op evaluation,  At surgeon's request and post-op pain management  Laterality: Right and Lower  Prep: chloraprep       Needles:  Injection technique: Single-shot  Needle Type: Echogenic Needle     Needle Length: 9cm  Needle Gauge: 22 and 22 G    Additional Needles:  Procedures: ultrasound guided Popliteal block Narrative:  Start time: 10/10/2011 10:15 AM End time: 10/10/2011 10:25 AM Injection made incrementally with aspirations every 5 mL.  Performed by: Personally  Anesthesiologist: Sheldon Silvan, MD  Additional Notes: Maraine 0.5% with EPI 1:200000  Popliteal block only, no femoral block.  Femoral nerve block

## 2011-10-14 ENCOUNTER — Encounter (HOSPITAL_COMMUNITY): Payer: Self-pay | Admitting: Orthopedic Surgery

## 2011-10-20 ENCOUNTER — Encounter (HOSPITAL_COMMUNITY): Payer: Self-pay

## 2011-10-20 ENCOUNTER — Ambulatory Visit (HOSPITAL_COMMUNITY)
Admission: RE | Admit: 2011-10-20 | Discharge: 2011-10-20 | Disposition: A | Discharge status: Home / Self Care | Payer: PRIVATE HEALTH INSURANCE | Source: Ambulatory Visit | Attending: Internal Medicine | Admitting: Internal Medicine

## 2011-10-20 VITALS — BP 130/79 | HR 72 | Ht 72.0 in | Wt 170.1 lb

## 2011-10-20 DIAGNOSIS — I509 Heart failure, unspecified: Secondary | ICD-10-CM | POA: Insufficient documentation

## 2011-10-20 DIAGNOSIS — I5022 Chronic systolic (congestive) heart failure: Secondary | ICD-10-CM | POA: Insufficient documentation

## 2011-10-20 MED ORDER — TORSEMIDE 20 MG PO TABS: 20.0000 mg | ORAL_TABLET | ORAL | Status: DC | PRN

## 2011-10-20 MED ORDER — HYDRALAZINE HCL 25 MG PO TABS: 12.5000 mg | ORAL_TABLET | Freq: Three times a day (TID) | ORAL | Status: DC

## 2011-10-20 NOTE — Assessment & Plan Note (Addendum)
He returns for follow up and he is doing quite well. BP 130/79. Will add hydralazine 12.5 mg TID for afterload reduction. Reinforced daily weights and low salt food choices. If weight increases to 173 pounds he is instructed to Torsemide 20 mg as needed. Follow up in 3 weeks.   Patient seen and examined with Timothy Becket, NP. We discussed all aspects of the encounter. I agree with the assessment and plan as stated above.  [name] continues to do well. Volume status maintained despite being off torsemide. Renal function improved. No further orthostasis. Will start low dose hydralazine. Not candidate for ACE/ARB due to renal insuff and hypotension with it. Reinforced need for daily weights and reviewed use of sliding scale diuretics.

## 2011-10-20 NOTE — Progress Notes (Signed)
Patient ID: [name], male   DOB: 1945/08/22, 66 y.o.   MRN: [id number] Orthopedic Surgeon: Dr Dion Saucier HPI:   [name] is a 66 year old male with chronic systolic CHF secondary to NICM echo EF 10-15% by echo 04/2011 (previously 30-35% 06/2010), HTN, anemia, chronic renal insufficiency Cr 1.31,  UGI bleed,  duodenal AVMs,  rectal bleeding 04/2011,  DVTs (2004, 2005, 2007, 2010, 2011) however coumadin on hold due to GI bleed, IVC filter 2005, GERD, BPD, and chronic low back .   Discharged 2/6 Weight 159 pounds.   RHC 2/13 RA = 4  RV = 32/6 (7)  PA = 45/15 (24)  PCW = 7  Fick cardiac output/index = 5.2/2.7  Thermo = 4.0/2.1  PVR = 3.4 Woods  FA sat = 94%  PA sat = 56%, 57%  Noninvasive BP : 125/92 (72)  Calculated SVR = 1040  Spironolactone switched to eplerenone due to gynecomastia.  08/13/11 CPX  RER 1.16 Peak VO2 18.6 ml/kg/min Predicted 61.4% VE/VCO2 slope 42.8  08/23/2011 EF 30-35%  Admitted in early May 2013 for syncope and acute renal failure in setting of volume depletion. Fall complicated by lower leg fracture.    10/10/11 S/P ORIF L ankle  He is here for follow up. Last visit Torsemide, IMDUR and Hydralalzine placed on hold due to hypotension. Weight at home 167-170 pounds. Denies SOB/PND/Orthopnea. Denies dizziness.  Compliant with medications. Ambulating with walker. Home Health discontinued.   ROS: All systems negative except as listed in HPI, PMH and Problem List.  Past Medical History  Diagnosis Date  . Venous thromboembolism     DVTs in 2004, 2005, 2007, 8/10, 8/11. IVC filter 2005. Off coumadin 04/2011 secondary to rectal bleeding  . 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 Chronic venous insufficiency   . DM2 (diabetes mellitus, type 2)   . GERD (gastroesophageal reflux disease)   . BPH (benign prostatic hypertrophy)   . Low back pain   . HTN (hypertension)   . HLD (hyperlipidemia)   . NICM (nonischemic cardiomyopathy) 1. 9/11  2.  2/12    1. Echo septal and apical akinesis, dilated LV, EF 45%, RV normal size and systolic function, EF 43% on Myoview  2. Admitted with decompensated CHF, echo EF 30-35% with diffuse hypokinesis but akinesis of mid to apical anteroseptal wall and apex, moderate LVH, mild MR, grade I diastolic dysfunction, SPEP/UP negative and HIV negative. Most recent EF 10-15% 04/2011  . Smoking   . CAD (coronary artery disease) 1. 2007  2. 10/11    1. Left heart cath with 40-50% stenosis in small RCA EF 50%  2. Lexiscan myoview EF 43%, global hypokinesis, possible small area of apical ischemia; LHC no angiographic CAD, no LV-gram done due to CKD   2. No angiographic CAD by cath 02/2010  . CKD (chronic kidney disease) stage 2, GFR 60-89 ml/min as of 02/2011  . Headache   . Diabetes mellitus   . Blood transfusion   . Anemia   . Syncope     1. in setting of presumed orthostatic Hypotn 09/2011  . Right fibular fracture     1. 09/2011 - resulting from fall/syncope  . Closed right ankle fracture 10/10/2011    Current Outpatient Prescriptions  Medication Sig Dispense Refill  . atorvastatin (LIPITOR) 20 MG tablet Take 20 mg by mouth every morning.       . digoxin (LANOXIN) 0.125 MG tablet Take 125  mcg by mouth daily.       Marland Kitchen eplerenone (INSPRA) 25 MG tablet Take 25 mg by mouth daily.       Marland Kitchen esomeprazole (NEXIUM) 40 MG capsule Take 40 mg by mouth daily before breakfast.      . glipiZIDE (GLUCOTROL XL) 5 MG 24 hr tablet Take 5 mg by mouth daily.        . insulin glargine (LANTUS) 100 UNIT/ML injection Inject 20 Units into the skin at bedtime.       . metoprolol succinate (TOPROL-XL) 50 MG 24 hr tablet Take 50 mg by mouth daily. Take with or immediately following a meal.      . oxyCODONE-acetaminophen (PERCOCET) 10-325 MG per tablet Take 1-2 tablets by mouth every 6 (six) hours as needed for pain. MAXIMUM TOTAL ACETAMINOPHEN DOSE IS 4000 MG PER DAY  75 tablet  0  . Tamsulosin HCl (FLOMAX) 0.4 MG CAPS Take 1 capsule  (0.4 mg total) by mouth at bedtime.  30 capsule  6  . promethazine (PHENERGAN) 25 MG tablet Take 1 tablet (25 mg total) by mouth every 6 (six) hours as needed for nausea.  30 tablet  0  Not taking eplerenone.     PHYSICAL EXAM: Filed Vitals:   10/20/11 1456  BP: 130/79  Pulse: 72  Height: 6' (1.829 m)  Weight: 170 lb 1.9 oz (77.166 kg)    General: Comfortable. No acute distress.  HEENT: normal except for poor dentition Neck: supple. JVP 5-6  Carotids 2+ bilaterally; no bruits. No lymphadenopathy or thryomegaly appreciated. Cor: PMI displaced laterally. regular rate and rhythm.  Lungs: clear Abdomen: soft, NT. Good bowel sounds Extremities: no cyanosis, clubbing, rash, no edema.  Right leg in cast LLE trace  Neuro: alert & orientedx3, cranial nerves grossly intact. Moves all 4 extremities w/o difficulty. Affect pleasant.    ASSESSMENT & PLAN:

## 2011-10-20 NOTE — Patient Instructions (Signed)
Follow up in 3 weeks  Take Torsemide 20 mg if your weight is 173 pounds or greater.  Take Hydralazine 12.5 mg TID.   Do the following things EVERYDAY: 1) Weigh yourself in the morning before breakfast. Write it down and keep it in a log. 2) Take your medicines as prescribed 3) Eat low salt foods--Limit salt (sodium) to 2000 mg per day.  4) Stay as active as you can everyday 5) Limit all fluids for the day to less than 2 liters

## 2011-11-11 ENCOUNTER — Ambulatory Visit (HOSPITAL_COMMUNITY)
Admission: RE | Admit: 2011-11-11 | Discharge: 2011-11-11 | Disposition: A | Discharge status: Home / Self Care | Payer: PRIVATE HEALTH INSURANCE | Source: Ambulatory Visit | Attending: Internal Medicine | Admitting: Internal Medicine

## 2011-11-11 VITALS — BP 142/78 | HR 90 | Wt 175.2 lb

## 2011-11-11 DIAGNOSIS — I2510 Atherosclerotic heart disease of native coronary artery without angina pectoris: Secondary | ICD-10-CM

## 2011-11-11 DIAGNOSIS — I5022 Chronic systolic (congestive) heart failure: Secondary | ICD-10-CM

## 2011-11-11 MED ORDER — HYDRALAZINE HCL 25 MG PO TABS: 25.0000 mg | ORAL_TABLET | Freq: Three times a day (TID) | ORAL | Status: DC

## 2011-11-11 NOTE — Progress Notes (Signed)
Patient ID: [name], male   DOB: 1945/12/20, 66 y.o.   MRN: [id number] Orthopedic Surgeon: Dr Dion Saucier HPI:   Mr. Danowski is a 66 year old male with chronic systolic CHF secondary to NICM echo EF 10-15% by echo 04/2011 (previously 30-35% 06/2010), HTN, anemia, chronic renal insufficiency Cr 1.31,  UGI bleed,  duodenal AVMs,  rectal bleeding 04/2011,  DVTs (2004, 2005, 2007, 2010, 2011) however coumadin on hold due to GI bleed, IVC filter 2005, GERD, BPD, and chronic low back .   Discharged 2/6 Weight 159 pounds.   RHC 2/13 RA = 4  RV = 32/6 (7)  PA = 45/15 (24)  PCW = 7  Fick cardiac output/index = 5.2/2.7  Thermo = 4.0/2.1  PVR = 3.4 Woods  FA sat = 94%  PA sat = 56%, 57%  Noninvasive BP : 125/92 (72)  Calculated SVR = 1040  Spironolactone switched to eplerenone due to gynecomastia.  08/13/11 CPX  RER 1.16 Peak VO2 18.6 ml/kg/min Predicted 61.4% VE/VCO2 slope 42.8  08/23/2011 EF 30-35%  Admitted in early May 2013 for syncope and acute renal failure in setting of volume depletion. Fall complicated by lower leg fracture.    10/10/11 S/P ORIF L ankle  He is here for follow up. Last visit hydralazine restarted 12.5 mg TID and he was instructed to take Torsemide 20 mg if his weight increased to 173 pounds.Feels much better. Denies SOB/PND/Orthopnea/Dizziness.  Weight at home 170-172. He has taken Torsemide 2-3 times per week. RLE cast will be removed July. Tries to follow low salt diet. Limiting fluid intake.   ROS: All systems negative except as listed in HPI, PMH and Problem List.  Past Medical History  Diagnosis Date  . Venous thromboembolism     DVTs in 2004, 2005, 2007, 8/10, 8/11. IVC filter 2005. Off coumadin 04/2011 secondary to rectal bleeding  . 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 Chronic venous insufficiency   . DM2 (diabetes mellitus, type 2)   . GERD (gastroesophageal reflux disease)   . BPH (benign prostatic hypertrophy)   . Low back  pain   . HTN (hypertension)   . HLD (hyperlipidemia)   . NICM (nonischemic cardiomyopathy) 1. 9/11  2. 2/12    1. Echo septal and apical akinesis, dilated LV, EF 45%, RV normal size and systolic function, EF 43% on Myoview  2. Admitted with decompensated CHF, echo EF 30-35% with diffuse hypokinesis but akinesis of mid to apical anteroseptal wall and apex, moderate LVH, mild MR, grade I diastolic dysfunction, SPEP/UP negative and HIV negative. Most recent EF 10-15% 04/2011  . Smoking   . CAD (coronary artery disease) 1. 2007  2. 10/11    1. Left heart cath with 40-50% stenosis in small RCA EF 50%  2. Lexiscan myoview EF 43%, global hypokinesis, possible small area of apical ischemia; LHC no angiographic CAD, no LV-gram done due to CKD   2. No angiographic CAD by cath 02/2010  . CKD (chronic kidney disease) stage 2, GFR 60-89 ml/min as of 02/2011  . Headache   . Diabetes mellitus   . Blood transfusion   . Anemia   . Syncope     1. in setting of presumed orthostatic Hypotn 09/2011  . Right fibular fracture     1. 09/2011 - resulting from fall/syncope  . Closed right ankle fracture 10/10/2011    Current Outpatient Prescriptions  Medication Sig Dispense Refill  . atorvastatin (LIPITOR)  20 MG tablet Take 20 mg by mouth every morning.       . digoxin (LANOXIN) 0.125 MG tablet Take 125 mcg by mouth daily.       Marland Kitchen eplerenone (INSPRA) 25 MG tablet Take 25 mg by mouth daily.       Marland Kitchen esomeprazole (NEXIUM) 40 MG capsule Take 40 mg by mouth daily before breakfast.      . glipiZIDE (GLUCOTROL XL) 5 MG 24 hr tablet Take 5 mg by mouth daily.        . hydrALAZINE (APRESOLINE) 25 MG tablet Take 0.5 tablets (12.5 mg total) by mouth 3 (three) times daily.  45 tablet  3  . insulin glargine (LANTUS) 100 UNIT/ML injection Inject 20 Units into the skin at bedtime.       . metoprolol succinate (TOPROL-XL) 50 MG 24 hr tablet Take 50 mg by mouth daily. Take with or immediately following a meal.      . promethazine  (PHENERGAN) 25 MG tablet Take 1 tablet (25 mg total) by mouth every 6 (six) hours as needed for nausea.  30 tablet  0  . Tamsulosin HCl (FLOMAX) 0.4 MG CAPS Take 1 capsule (0.4 mg total) by mouth at bedtime.  30 capsule  6  . torsemide (DEMADEX) 20 MG tablet Take 1 tablet (20 mg total) by mouth as needed.  30 tablet  2  Not taking eplerenone.     PHYSICAL EXAM: Filed Vitals:   11/11/11 1045  BP: 142/78  Pulse: 90  Weight: 175 lb 4 oz (79.493 kg)  SpO2: 98%    General: Comfortable. No acute distress.  HEENT: normal except for poor dentition Neck: supple. JVP 5-6  Carotids 2+ bilaterally; no bruits. No lymphadenopathy or thryomegaly appreciated. Cor: PMI displaced laterally. regular rate and rhythm.  Lungs: clear Abdomen: soft, NT. Good bowel sounds Extremities: no cyanosis, clubbing, rash, no edema.  Right leg in cast no edema. LLE no edema.   Neuro: alert & orientedx3, cranial nerves grossly intact. Moves all 4 extremities w/o difficulty. Affect pleasant.    ASSESSMENT & PLAN:

## 2011-11-11 NOTE — Assessment & Plan Note (Signed)
Volume status remains stable. Continue diuretics needed if his weight is greater than 173 pounds. SBP > 130. Increase hydralazine 25 mg TID>  Reinforced limiting fluid intake, daily weights, and low salt diet choices. Follow up in 6 weeks.

## 2011-11-11 NOTE — Patient Instructions (Addendum)
Take Hydralazine 25 mg three times a day  Do the following things EVERYDAY: 1) Weigh yourself in the morning before breakfast. Write it down and keep it in a log. 2) Take your medicines as prescribed 3) Eat low salt foods--Limit salt (sodium) to 2000 mg per day.  4) Stay as active as you can everyday 5) Limit all fluids for the day to less than 2 liters  Follow up in 6 weeks

## 2011-11-11 NOTE — Assessment & Plan Note (Signed)
No evidence of ischemia. Patient refuse anticoagulants due to past bleeding problems.

## 2011-12-23 ENCOUNTER — Encounter (HOSPITAL_COMMUNITY): Payer: Self-pay

## 2011-12-23 ENCOUNTER — Ambulatory Visit (HOSPITAL_COMMUNITY)
Admission: RE | Admit: 2011-12-23 | Discharge: 2011-12-23 | Disposition: A | Discharge status: Home / Self Care | Payer: PRIVATE HEALTH INSURANCE | Source: Ambulatory Visit | Attending: Internal Medicine | Admitting: Internal Medicine

## 2011-12-23 VITALS — BP 156/94 | HR 94 | Wt 182.5 lb

## 2011-12-23 DIAGNOSIS — I5022 Chronic systolic (congestive) heart failure: Secondary | ICD-10-CM | POA: Insufficient documentation

## 2011-12-23 DIAGNOSIS — I2510 Atherosclerotic heart disease of native coronary artery without angina pectoris: Secondary | ICD-10-CM | POA: Insufficient documentation

## 2011-12-23 MED ORDER — TORSEMIDE 20 MG PO TABS: 20.0000 mg | ORAL_TABLET | Freq: Every day | ORAL | Status: DC

## 2011-12-23 NOTE — Progress Notes (Signed)
Patient ID: [name], male   DOB: 10/13/1945, 66 y.o.   MRN: [id number] Orthopedic Surgeon: Dr Dion Saucier HPI:   [name] is a 66 year old male with chronic systolic CHF secondary to NICM echo EF 10-15% by echo 04/2011 (previously 30-35% 06/2010), HTN, anemia, chronic renal insufficiency Cr 1.31,  UGI bleed,  duodenal AVMs,  rectal bleeding 04/2011,  DVTs (2004, 2005, 2007, 2010, 2011) however coumadin on hold due to GI bleed, IVC filter 2005, GERD, BPD, and chronic low back .   Discharged 2/6 Weight 159 pounds.   RHC 2/13 RA = 4  RV = 32/6 (7)  PA = 45/15 (24)  PCW = 7  Fick cardiac output/index = 5.2/2.7  Thermo = 4.0/2.1  PVR = 3.4 Woods  FA sat = 94%  PA sat = 56%, 57%  Noninvasive BP : 125/92 (72)  Calculated SVR = 1040  Spironolactone switched to eplerenone due to gynecomastia.  08/13/11 CPX  RER 1.16 Peak VO2 18.6 ml/kg/min Predicted 61.4% VE/VCO2 slope 42.8  08/23/2011 EF 30-35%  Admitted in early May 2013 for syncope and acute renal failure in setting of volume depletion. Fall complicated by lower leg fracture.    10/10/11 S/P ORIF L ankle  He is here for follow up. Last visit hydralazine increased to 25 mg TID. He has been taking Torsemide as needed which has been about 2-3 times per week. Feels great. Denies SOB/PND/Orthonpnea. Weight at home 169-179 pounds. He has been drinking lots of water > 2 liters per day. Able to walk 1 mile without stopping. He not longer uses cane or walker. Following low salt diet.    ROS: All systems negative except as listed in HPI, PMH and Problem List.  Past Medical History  Diagnosis Date  . Venous thromboembolism     DVTs in 2004, 2005, 2007, 8/10, 8/11. IVC filter 2005. Off coumadin 04/2011 secondary to rectal bleeding  . 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 Chronic venous insufficiency   . DM2 (diabetes mellitus, type 2)   . GERD (gastroesophageal reflux disease)   . BPH (benign prostatic hypertrophy)     . Low back pain   . HTN (hypertension)   . HLD (hyperlipidemia)   . NICM (nonischemic cardiomyopathy) 1. 9/11  2. 2/12    1. Echo septal and apical akinesis, dilated LV, EF 45%, RV normal size and systolic function, EF 43% on Myoview  2. Admitted with decompensated CHF, echo EF 30-35% with diffuse hypokinesis but akinesis of mid to apical anteroseptal wall and apex, moderate LVH, mild MR, grade I diastolic dysfunction, SPEP/UP negative and HIV negative. Most recent EF 10-15% 04/2011  . Smoking   . CAD (coronary artery disease) 1. 2007  2. 10/11    1. Left heart cath with 40-50% stenosis in small RCA EF 50%  2. Lexiscan myoview EF 43%, global hypokinesis, possible small area of apical ischemia; LHC no angiographic CAD, no LV-gram done due to CKD   2. No angiographic CAD by cath 02/2010  . CKD (chronic kidney disease) stage 2, GFR 60-89 ml/min as of 02/2011  . Headache   . Diabetes mellitus   . Blood transfusion   . Anemia   . Syncope     1. in setting of presumed orthostatic Hypotn 09/2011  . Right fibular fracture     1. 09/2011 - resulting from fall/syncope  . Closed right ankle fracture 10/10/2011    Current Outpatient Prescriptions  Medication Sig Dispense Refill  . atorvastatin (LIPITOR) 20 MG tablet Take 20 mg by mouth every morning.       . digoxin (LANOXIN) 0.125 MG tablet Take 125 mcg by mouth daily.       Marland Kitchen eplerenone (INSPRA) 25 MG tablet Take 25 mg by mouth daily.       Marland Kitchen esomeprazole (NEXIUM) 40 MG capsule Take 40 mg by mouth daily before breakfast.      . glipiZIDE (GLUCOTROL XL) 5 MG 24 hr tablet Take 5 mg by mouth daily.        . hydrALAZINE (APRESOLINE) 25 MG tablet Take 1 tablet (25 mg total) by mouth 3 (three) times daily.  45 tablet  3  . insulin glargine (LANTUS) 100 UNIT/ML injection Inject 20 Units into the skin at bedtime.       . metoprolol succinate (TOPROL-XL) 50 MG 24 hr tablet Take 50 mg by mouth daily. Take with or immediately following a meal.      .  promethazine (PHENERGAN) 25 MG tablet Take 1 tablet (25 mg total) by mouth every 6 (six) hours as needed for nausea.  30 tablet  0  . Tamsulosin HCl (FLOMAX) 0.4 MG CAPS Take 1 capsule (0.4 mg total) by mouth at bedtime.  30 capsule  6  . torsemide (DEMADEX) 20 MG tablet Take 1 tablet (20 mg total) by mouth as needed.  30 tablet  2     PHYSICAL EXAM: Filed Vitals:   12/23/11 1023  BP: 156/94  Pulse: 94  Weight: 182 lb 8 oz (82.781 kg)  SpO2: 100%    General: Comfortable. No acute distress.  HEENT: normal except for poor dentition Neck: supple. JVP 6-7 Carotids 2+ bilaterally; no bruits. No lymphadenopathy or thryomegaly appreciated. Cor: PMI displaced laterally. regular rate and rhythm.  Lungs: clear Abdomen: soft, NT. Good bowel sounds Extremities: no cyanosis, clubbing, rash, no edema.  RLE/ LLE trace edema.   Neuro: alert & orientedx3, cranial nerves grossly intact. Moves all 4 extremities w/o difficulty. Affect pleasant.    ASSESSMENT & PLAN:

## 2011-12-23 NOTE — Assessment & Plan Note (Signed)
No evidence of ischemia 

## 2011-12-23 NOTE — Patient Instructions (Addendum)
Follow up in 1 month  Take Torsemide 20 daily  Do the following things EVERYDAY: 1) Weigh yourself in the morning before breakfast. Write it down and keep it in a log. 2) Take your medicines as prescribed 3) Eat low salt foods-Limit salt (sodium) to 2000 mg per day.  4) Stay as active as you can everyday 5) Limit all fluids for the day to less than 2 liters

## 2011-12-23 NOTE — Assessment & Plan Note (Signed)
Functionally he is much improved as he is able to walk 1 mile without stopping. Volume status mildly elevated. Instructed to take Toresmide 20 mg daily. Reinforced limiting fluids to < 2 liters per day. Congratulated him on low salt food choices. Follow up in 1 month and check BMET.Marland Kitchen

## 2011-12-25 ENCOUNTER — Encounter (HOSPITAL_COMMUNITY): Payer: Self-pay | Admitting: *Deleted

## 2011-12-25 ENCOUNTER — Emergency Department (HOSPITAL_COMMUNITY)
Admission: EM | Admit: 2011-12-25 | Discharge: 2011-12-25 | Disposition: A | Discharge status: Home / Self Care | Payer: PRIVATE HEALTH INSURANCE | Attending: Emergency Medicine | Admitting: Emergency Medicine

## 2011-12-25 ENCOUNTER — Emergency Department (HOSPITAL_COMMUNITY): Payer: PRIVATE HEALTH INSURANCE

## 2011-12-25 DIAGNOSIS — M25519 Pain in unspecified shoulder: Secondary | ICD-10-CM

## 2011-12-25 DIAGNOSIS — F17200 Nicotine dependence, unspecified, uncomplicated: Secondary | ICD-10-CM | POA: Insufficient documentation

## 2011-12-25 DIAGNOSIS — K219 Gastro-esophageal reflux disease without esophagitis: Secondary | ICD-10-CM | POA: Insufficient documentation

## 2011-12-25 DIAGNOSIS — D649 Anemia, unspecified: Secondary | ICD-10-CM | POA: Insufficient documentation

## 2011-12-25 DIAGNOSIS — N400 Enlarged prostate without lower urinary tract symptoms: Secondary | ICD-10-CM | POA: Insufficient documentation

## 2011-12-25 DIAGNOSIS — I10 Essential (primary) hypertension: Secondary | ICD-10-CM | POA: Insufficient documentation

## 2011-12-25 DIAGNOSIS — Z86718 Personal history of other venous thrombosis and embolism: Secondary | ICD-10-CM | POA: Insufficient documentation

## 2011-12-25 DIAGNOSIS — E785 Hyperlipidemia, unspecified: Secondary | ICD-10-CM | POA: Insufficient documentation

## 2011-12-25 DIAGNOSIS — Z794 Long term (current) use of insulin: Secondary | ICD-10-CM | POA: Insufficient documentation

## 2011-12-25 DIAGNOSIS — I2510 Atherosclerotic heart disease of native coronary artery without angina pectoris: Secondary | ICD-10-CM | POA: Insufficient documentation

## 2011-12-25 DIAGNOSIS — E119 Type 2 diabetes mellitus without complications: Secondary | ICD-10-CM | POA: Insufficient documentation

## 2011-12-25 MED ORDER — HYDROCODONE-ACETAMINOPHEN 5-325 MG PO TABS: 1.0000 | ORAL_TABLET | ORAL | Status: AC | PRN

## 2011-12-25 MED ORDER — ACETAMINOPHEN 325 MG PO TABS
650.0000 mg | ORAL_TABLET | Freq: Once | ORAL | Status: AC
  Administered 2011-12-25: 650 mg via ORAL
  Filled 2011-12-25: qty 2

## 2011-12-25 NOTE — ED Provider Notes (Signed)
Medical screening examination/treatment/procedure(s) were conducted as a shared visit with non-physician practitioner(s) and myself.  I personally evaluated the patient during the encounter  Pt without sx concerning for stroke or TIA. Right shoulder pain likely from bursitis. Denies any abdominal pain. Patient's left-sided facial discomfort was without numbness or tingling. No facial drooping was appreciated. He denies any unilateral weakness.  Toy Baker, MD 12/25/11 1335

## 2011-12-25 NOTE — ED Notes (Signed)
PT reports RT shoulder pain starting last PM . Pt has limited mobility of RT shoulder due to pain. Pt reports some tightness to Lt side of face . Neg stroke screen.

## 2011-12-25 NOTE — ED Provider Notes (Signed)
History     CSN: [id number]  Arrival date & time 12/25/11  1240   First MD Initiated Contact with Patient 12/25/11 1250      Chief Complaint  Patient presents with  . Shoulder Pain    (Consider location/radiation/quality/duration/timing/severity/associated sxs/prior treatment) HPI Comments: Patient presents today with a chief complaint of right shoulder pain.  He reports that the pain has been present since approximately 6:30 PM yesterday evening.  The pain does not radiate.  No numbness/tingling or weakness associated with the pain.  His ROM of the shoulder is decreased.  He has not taken anything for pain.  He denies any acute injury or trauma.  Denies lifting anything heavy recently.  No erythema or swelling.   He also reports that for three hours this morning he had some discomfort on the left side of his face.  He denies any numbness/tingling of the face.  Denies any facial asymmetry.  Denies dysphagia or difficulty speaking.  No vision changes.  Facial discomfort has resolved at this time.  The history is provided by the patient.    Past Medical History  Diagnosis Date  . Venous thromboembolism     DVTs in 2004, 2005, 2007, 8/10, 8/11. IVC filter 2005. Off coumadin 04/2011 secondary to rectal bleeding  . 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 Chronic venous insufficiency   . DM2 (diabetes mellitus, type 2)   . GERD (gastroesophageal reflux disease)   . BPH (benign prostatic hypertrophy)   . Low back pain   . HTN (hypertension)   . HLD (hyperlipidemia)   . NICM (nonischemic cardiomyopathy) 1. 9/11  2. 2/12    1. Echo septal and apical akinesis, dilated LV, EF 45%, RV normal size and systolic function, EF 43% on Myoview  2. Admitted with decompensated CHF, echo EF 30-35% with diffuse hypokinesis but akinesis of mid to apical anteroseptal wall and apex, moderate LVH, mild MR, grade I diastolic dysfunction, SPEP/UP negative and HIV negative. Most recent EF  10-15% 04/2011  . Smoking   . CAD (coronary artery disease) 1. 2007  2. 10/11    1. Left heart cath with 40-50% stenosis in small RCA EF 50%  2. Lexiscan myoview EF 43%, global hypokinesis, possible small area of apical ischemia; LHC no angiographic CAD, no LV-gram done due to CKD   2. No angiographic CAD by cath 02/2010  . CKD (chronic kidney disease) stage 2, GFR 60-89 ml/min as of 02/2011  . Headache   . Diabetes mellitus   . Blood transfusion   . Anemia   . Syncope     1. in setting of presumed orthostatic Hypotn 09/2011  . Right fibular fracture     1. 09/2011 - resulting from fall/syncope  . Closed right ankle fracture 10/10/2011    Past Surgical History  Procedure Date  . Cardiac catheterization   . Appendectomy   . Cholecystectomy   . Colonoscopy 05/01/2011    Procedure: COLONOSCOPY;  Surgeon: Louis Meckel, MD;  Location: [facility]COPY;  Service: Endoscopy;  Laterality: N/A;  . Orif ankle fracture 10/10/2011    Procedure: OPEN REDUCTION INTERNAL FIXATION (ORIF) ANKLE FRACTURE;  Surgeon: Eulas Post, MD;  Location: [facility] OR;  Service: Orthopedics;  Laterality: Right;  Right  Ankle Fracture ORIF    Family History  Problem Relation Age of Onset  . Coronary artery disease Neg Hx     Premature    History  Substance Use  Topics  . Smoking status: Current Some Day Smoker -- 1.0 packs/day for 40 years    Types: Cigarettes    Last Attempt to Quit: 06/30/2011  . Smokeless tobacco: Never Used  . Alcohol Use: No      Review of Systems  Constitutional: Negative for fever and chills.  HENT: Negative for facial swelling and trouble swallowing.   Eyes: Negative for visual disturbance.  Gastrointestinal: Negative for nausea and vomiting.  Musculoskeletal: Negative for joint swelling and gait problem.  Skin: Negative for color change.  Neurological: Negative for dizziness, tremors, syncope, facial asymmetry, speech difficulty, weakness, light-headedness, numbness and headaches.   Psychiatric/Behavioral: Negative for confusion.    Allergies  Review of patient's allergies indicates no known allergies.  Home Medications   Current Outpatient Rx  Name Route Sig Dispense Refill  . ATORVASTATIN CALCIUM 20 MG PO TABS Oral Take 20 mg by mouth every morning.     Marland Kitchen DIGOXIN 0.125 MG PO TABS Oral Take 125 mcg by mouth daily.     . EPLERENONE 25 MG PO TABS Oral Take 25 mg by mouth daily.     Marland Kitchen ESOMEPRAZOLE MAGNESIUM 40 MG PO CPDR Oral Take 40 mg by mouth daily before breakfast.    . GLIPIZIDE ER 5 MG PO TB24 Oral Take 5 mg by mouth daily.      Marland Kitchen HYDRALAZINE HCL 25 MG PO TABS Oral Take 25 mg by mouth daily.    . INSULIN GLARGINE 100 UNIT/ML [place] SOLN Subcutaneous Inject 20 Units into the skin at bedtime.     Marland Kitchen METOPROLOL SUCCINATE ER 50 MG PO TB24 Oral Take 50 mg by mouth daily. Take with or immediately following a meal.    . TAMSULOSIN HCL 0.4 MG PO CAPS Oral Take 1 capsule (0.4 mg total) by mouth at bedtime. 30 capsule 6  . TORSEMIDE 20 MG PO TABS Oral Take 20 mg by mouth every other day.      BP 142/90  Pulse 78  Temp 98 F (36.7 C) (Oral)  Resp 21  SpO2 96%  Physical Exam  Nursing note and vitals reviewed. Constitutional: He appears well-developed and well-nourished.  HENT:  Head: Normocephalic and atraumatic.  Cardiovascular: Normal rate, regular rhythm and normal heart sounds.   Pulmonary/Chest: Effort normal and breath sounds normal.  Musculoskeletal:       Right shoulder: He exhibits decreased range of motion, tenderness and bony tenderness. He exhibits no swelling, no effusion, no deformity, normal pulse and normal strength.  Neurological: He is alert. He has normal strength. No cranial nerve deficit or sensory deficit. Coordination and gait normal.       Smile symmetric No facial droop No ataxia Finger to nose testing intact Normal rapid alternating movements. Grip strength 5/5 bilaterally   Skin: Skin is warm and dry.  Psychiatric: He has a normal  mood and affect.    ED Course  Procedures (including critical care time)  Labs Reviewed - No data to display Dg Shoulder Right  12/25/2011  *RADIOLOGY REPORT*  Clinical Data: Right shoulder pain, no injury  RIGHT SHOULDER - 2+ VIEW  Comparison: None.  Findings: The right humeral head is in normal position and the glenohumeral joint space appears normal.  The right AC joint is normally aligned.  No acute abnormality is seen.  IMPRESSION: Negative right shoulder.  Original Report Authenticated By: Juline Patch, M.D.     No diagnosis found.    MDM  Patient presents today with a chief  complaint of right shoulder pain that has been present since last evening.  Xray negative.  Patient neurovascularly intact.  No erythema or edema of shoulder.  No numbness or tingling.  Patient also complaining of facial pain that has resolved.  No focal neurological deficits.  No numbness of the face.  No dysphagia or difficulty speaking.  No facial asymmetry.  Therefore, feel that CVA is very unlikely.  Patient instructed to follow up with PCP.  Return precautions discussed.          Pascal Lux Macdona, PA-C 12/25/11 1744

## 2011-12-31 NOTE — ED Provider Notes (Signed)
Medical screening examination/treatment/procedure(s) were performed by non-physician practitioner and as supervising physician I was immediately available for consultation/collaboration.  Bo Rogue T Kaylamarie Swickard, MD 12/31/11 2343 

## 2012-01-07 ENCOUNTER — Other Ambulatory Visit: Payer: Self-pay

## 2012-01-07 MED ORDER — EPLERENONE 25 MG PO TABS: 25.0000 mg | ORAL_TABLET | Freq: Every day | ORAL | Status: DC

## 2012-01-07 MED ORDER — DIGOXIN 125 MCG PO TABS: 0.1250 mg | ORAL_TABLET | Freq: Every day | ORAL | Status: DC

## 2012-01-07 NOTE — Telephone Encounter (Signed)
..   Requested Prescriptions   Pending Prescriptions Disp Refills  . digoxin (LANOXIN) 0.125 MG tablet 90 tablet 3    Sig: Take 1 tablet (0.125 mg total) by mouth daily.  Marland Kitchen eplerenone (INSPRA) 25 MG tablet 90 tablet 3    Sig: Take 1 tablet (25 mg total) by mouth daily.

## 2012-01-14 ENCOUNTER — Telehealth (HOSPITAL_COMMUNITY): Payer: Self-pay | Admitting: *Deleted

## 2012-01-14 NOTE — Telephone Encounter (Signed)
Pt called and stated he needs a letter from Dr Gala Romney stating that he needs to move to another apartment, currently he is on the eight floor and the elevator continuously is out of order and it is difficult for him to climb that many stairs, he will pick up letter at OV 9/5, will send to Dr Gala Romney to complete

## 2012-01-22 ENCOUNTER — Ambulatory Visit (HOSPITAL_COMMUNITY)
Admission: RE | Admit: 2012-01-22 | Discharge: 2012-01-22 | Disposition: A | Discharge status: Home / Self Care | Payer: PRIVATE HEALTH INSURANCE | Source: Ambulatory Visit | Attending: Internal Medicine | Admitting: Internal Medicine

## 2012-01-22 ENCOUNTER — Encounter (HOSPITAL_COMMUNITY): Payer: Self-pay

## 2012-01-22 ENCOUNTER — Encounter (HOSPITAL_COMMUNITY): Payer: Self-pay | Admitting: *Deleted

## 2012-01-22 VITALS — BP 150/92 | HR 103 | Resp 18 | Ht 72.0 in | Wt 185.4 lb

## 2012-01-22 DIAGNOSIS — I10 Essential (primary) hypertension: Secondary | ICD-10-CM | POA: Insufficient documentation

## 2012-01-22 DIAGNOSIS — I5022 Chronic systolic (congestive) heart failure: Secondary | ICD-10-CM | POA: Insufficient documentation

## 2012-01-22 LAB — BASIC METABOLIC PANEL
Calcium: 9.7 mg/dL (ref 8.4–10.5)
Creatinine, Ser: 1.47 mg/dL — ABNORMAL HIGH (ref 0.50–1.35)
GFR calc Af Amer: 57 mL/min — ABNORMAL LOW (ref >90)

## 2012-01-22 MED ORDER — HYDRALAZINE HCL 25 MG PO TABS: 37.5000 mg | ORAL_TABLET | Freq: Every day | ORAL | Status: DC

## 2012-01-22 NOTE — Patient Instructions (Addendum)
Take Torsemdie 40 mg for the next 3 days  Take hydralazine 37.5 mg three times a day  Follow up in 6 weeks  Do the following things EVERYDAY: 1) Weigh yourself in the morning before breakfast. Write it down and keep it in a log. 2) Take your medicines as prescribed 3) Eat low salt foods-Limit salt (sodium) to 2000 mg per day.  4) Stay as active as you can everyday 5) Limit all fluids for the day to less than 2 liters

## 2012-01-22 NOTE — Telephone Encounter (Signed)
Letter completed and given to pt at OV

## 2012-01-22 NOTE — Progress Notes (Signed)
Patient ID: [name], male   DOB: Oct 16, 1945, 66 y.o.   MRN: [id number] Orthopedic Surgeon: Dr Dion Saucier PCP: Dr Pecola Leisure  HPI:  [name] is a 66 year old male with chronic systolic CHF secondary to NICM echo EF 10-15% by echo 04/2011 (previously 30-35% 06/2010), HTN, anemia, chronic renal insufficiency Cr 1.31,  UGI bleed,  duodenal AVMs,  rectal bleeding 04/2011,  DVTs (2004, 2005, 2007, 2010, 2011) however coumadin on hold due to GI bleed, IVC filter 2005, GERD, BPD, and chronic low back .   Discharged 2/6 Weight 159 pounds.   RHC 2/13 RA = 4  RV = 32/6 (7)  PA = 45/15 (24)  PCW = 7  Fick cardiac output/index = 5.2/2.7  Thermo = 4.0/2.1  PVR = 3.4 Woods  FA sat = 94%  PA sat = 56%, 57%  Noninvasive BP : 125/92 (72)  Calculated SVR = 1040  Spironolactone switched to eplerenone due to gynecomastia.  08/13/11 CPX  RER 1.16 Peak VO2 18.6 ml/kg/min Predicted 61.4% VE/VCO2 slope 42.8  08/23/2011 EF 30-35%  Admitted in early May 2013 for syncope and acute renal failure in setting of volume depletion. Fall complicated by lower leg fracture.    10/10/11 S/P ORIF L ankle  He is here for follow up. Last visit started on Torsemide 20 mg daily. Denies SOB/PND/Orthopnea. Minimal lower extremity edema. Weight at home 169-175 pounds. Walking about 1 mile per day without difficulty. Complaint with medications. Drinking >2 liters per day.    ROS: All systems negative except as listed in HPI, PMH and Problem List.  Past Medical History  Diagnosis Date  . Venous thromboembolism     DVTs in 2004, 2005, 2007, 8/10, 8/11. IVC filter 2005. Off coumadin 04/2011 secondary to rectal bleeding  . 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 Chronic venous insufficiency   . DM2 (diabetes mellitus, type 2)   . GERD (gastroesophageal reflux disease)   . BPH (benign prostatic hypertrophy)   . Low back pain   . HTN (hypertension)   . HLD (hyperlipidemia)   . NICM (nonischemic  cardiomyopathy) 1. 9/11  2. 2/12    1. Echo septal and apical akinesis, dilated LV, EF 45%, RV normal size and systolic function, EF 43% on Myoview  2. Admitted with decompensated CHF, echo EF 30-35% with diffuse hypokinesis but akinesis of mid to apical anteroseptal wall and apex, moderate LVH, mild MR, grade I diastolic dysfunction, SPEP/UP negative and HIV negative. Most recent EF 10-15% 04/2011  . Smoking   . CAD (coronary artery disease) 1. 2007  2. 10/11    1. Left heart cath with 40-50% stenosis in small RCA EF 50%  2. Lexiscan myoview EF 43%, global hypokinesis, possible small area of apical ischemia; LHC no angiographic CAD, no LV-gram done due to CKD   2. No angiographic CAD by cath 02/2010  . CKD (chronic kidney disease) stage 2, GFR 60-89 ml/min as of 02/2011  . Headache   . Diabetes mellitus   . Blood transfusion   . Anemia   . Syncope     1. in setting of presumed orthostatic Hypotn 09/2011  . Right fibular fracture     1. 09/2011 - resulting from fall/syncope  . Closed right ankle fracture 10/10/2011    Current Outpatient Prescriptions  Medication Sig Dispense Refill  . atorvastatin (LIPITOR) 20 MG tablet Take 20 mg by mouth every morning.       Marland Kitchen  digoxin (LANOXIN) 0.125 MG tablet Take 1 tablet (0.125 mg total) by mouth daily.  90 tablet  3  . eplerenone (INSPRA) 25 MG tablet Take 1 tablet (25 mg total) by mouth daily.  90 tablet  3  . esomeprazole (NEXIUM) 40 MG capsule Take 40 mg by mouth daily before breakfast.      . glipiZIDE (GLUCOTROL XL) 5 MG 24 hr tablet Take 5 mg by mouth daily.        . hydrALAZINE (APRESOLINE) 25 MG tablet Take 25 mg by mouth daily.      . insulin glargine (LANTUS) 100 UNIT/ML injection Inject 20 Units into the skin at bedtime.       . metoprolol succinate (TOPROL-XL) 50 MG 24 hr tablet Take 50 mg by mouth daily. Take with or immediately following a meal.      . Tamsulosin HCl (FLOMAX) 0.4 MG CAPS Take 1 capsule (0.4 mg total) by mouth at bedtime.   30 capsule  6  . torsemide (DEMADEX) 20 MG tablet Take 20 mg by mouth every other day.         PHYSICAL EXAM: Filed Vitals:   01/22/12 0949  BP: 150/92  Pulse: 103  Resp: 18  Height: 6' (1.829 m)  Weight: 185 lb 6.4 oz (84.097 kg)  SpO2: 98%    General: Comfortable. No acute distress.  HEENT: normal except for poor dentition Neck: supple. JVP 6-7 Carotids 2+ bilaterally; no bruits. No lymphadenopathy or thryomegaly appreciated. Cor: PMI displaced laterally. regular rate and rhythm.  Lungs: clear Abdomen: soft, NT. Good bowel sounds. Mildly distended Extremities: no cyanosis, clubbing, rash, no edema.  RLE/ LLE trace edema.   Neuro: alert & orientedx3, cranial nerves grossly intact. Moves all 4 extremities w/o difficulty. Affect pleasant.    ASSESSMENT & PLAN:

## 2012-01-22 NOTE — Assessment & Plan Note (Addendum)
NYHA III. Mild dyspnea after walking a mile. Volume status mildly elevated. Double Torsemide for the next 3 days then resume torsemide 20 mg daily. SBP> 130 . Increase hydralazine 37.5 mg three times a day. Reinforced limiting fluid intake to < 2 liters per day and daily weights.  Check bmet today. Follow up in 6-8 weeks.

## 2012-01-22 NOTE — Assessment & Plan Note (Signed)
Increase hydralazine 37.5 mg tid. Goal for SBP < 130.

## 2012-01-27 ENCOUNTER — Telehealth (HOSPITAL_COMMUNITY): Payer: Self-pay | Admitting: *Deleted

## 2012-01-27 NOTE — Telephone Encounter (Signed)
Pt called requesting his info be faxed to his pcp Dr Leilani Able at [phone], last OV note and labs faxed

## 2012-03-04 ENCOUNTER — Encounter (HOSPITAL_COMMUNITY): Payer: Self-pay

## 2012-03-04 ENCOUNTER — Ambulatory Visit (HOSPITAL_COMMUNITY)
Admission: RE | Admit: 2012-03-04 | Discharge: 2012-03-04 | Disposition: A | Discharge status: Home / Self Care | Payer: PRIVATE HEALTH INSURANCE | Source: Ambulatory Visit | Attending: Internal Medicine | Admitting: Internal Medicine

## 2012-03-04 VITALS — BP 144/96 | HR 104 | Ht 72.0 in | Wt 195.1 lb

## 2012-03-04 DIAGNOSIS — I10 Essential (primary) hypertension: Secondary | ICD-10-CM | POA: Insufficient documentation

## 2012-03-04 DIAGNOSIS — I5022 Chronic systolic (congestive) heart failure: Secondary | ICD-10-CM | POA: Insufficient documentation

## 2012-03-04 MED ORDER — TORSEMIDE 20 MG PO TABS: 20.0000 mg | ORAL_TABLET | Freq: Every day | ORAL | Status: DC

## 2012-03-04 MED ORDER — HYDRALAZINE HCL 25 MG PO TABS: 25.0000 mg | ORAL_TABLET | Freq: Three times a day (TID) | ORAL | Status: DC

## 2012-03-04 NOTE — Patient Instructions (Addendum)
Increase hydralazine 1 tab three times daily  Take torsemide 1 tab daily   Follow up 2 weeks.

## 2012-03-04 NOTE — Progress Notes (Signed)
Patient ID: [name], male   DOB: [birth year], 66 y.o.   MRN: [id number] Orthopedic Surgeon: Dr Dion Saucier PCP: Dr Pecola Leisure  HPI:  [name] is a 66 year old male with chronic systolic CHF secondary to NICM echo EF 10-15% by echo 04/2011 (previously 30-35% 06/2010), HTN, anemia, chronic renal insufficiency Cr 1.31,  UGI bleed,  duodenal AVMs,  rectal bleeding 04/2011,  DVTs (2004, 2005, 2007, 2010, 2011) however coumadin on hold due to GI bleed, IVC filter 2005, GERD, BPD, and chronic low back .   Discharged 2/6 Weight 159 pounds.   RHC 2/13 RA = 4  RV = 32/6 (7)  PA = 45/15 (24)  PCW = 7  Fick cardiac output/index = 5.2/2.7  Thermo = 4.0/2.1  PVR = 3.4 Woods  FA sat = 94%  PA sat = 56%, 57%  Noninvasive BP : 125/92 (72)  Calculated SVR = 1040  Spironolactone switched to eplerenone due to gynecomastia.  08/13/11 CPX  RER 1.16 Peak VO2 18.6 ml/kg/min Predicted 61.4% VE/VCO2 slope 42.8  08/23/2011 EF 30-35%  Admitted in early May 2013 for syncope and acute renal failure in setting of volume depletion. Fall complicated by lower leg fracture.    10/10/11 S/P ORIF L ankle  He is here for follow up today.  Feels ok.  Weight trending up to 201 pounds.  He had been off all torsemide but restarted yesterday when weight hit 201 pounds.  It has already fallen several pounds since taking diuretic.  He has abdominal distention.  Mild lower extremity edema.  Denies PND/Orthopnea.   Complaint with medications. Drinking >2 liters per day.     ROS: All systems negative except as listed in HPI, PMH and Problem List.  Past Medical History  Diagnosis Date  . Venous thromboembolism     DVTs in 2004, 2005, 2007, 8/10, 8/11. IVC filter 2005. Off coumadin 04/2011 secondary to rectal bleeding  . 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 Chronic venous insufficiency   . DM2 (diabetes mellitus, type 2)   . GERD (gastroesophageal reflux disease)   . BPH (benign prostatic hypertrophy)    . Low back pain   . HTN (hypertension)   . HLD (hyperlipidemia)   . NICM (nonischemic cardiomyopathy) 1. 9/11  2. 2/12    1. Echo septal and apical akinesis, dilated LV, EF 45%, RV normal size and systolic function, EF 43% on Myoview  2. Admitted with decompensated CHF, echo EF 30-35% with diffuse hypokinesis but akinesis of mid to apical anteroseptal wall and apex, moderate LVH, mild MR, grade I diastolic dysfunction, SPEP/UP negative and HIV negative. Most recent EF 10-15% 04/2011  . Smoking   . CAD (coronary artery disease) 1. 2007  2. 10/11    1. Left heart cath with 40-50% stenosis in small RCA EF 50%  2. Lexiscan myoview EF 43%, global hypokinesis, possible small area of apical ischemia; LHC no angiographic CAD, no LV-gram done due to CKD   2. No angiographic CAD by cath 02/2010  . CKD (chronic kidney disease) stage 2, GFR 60-89 ml/min as of 02/2011  . Headache   . Diabetes mellitus   . Blood transfusion   . Anemia   . Syncope     1. in setting of presumed orthostatic Hypotn 09/2011  . Right fibular fracture     1. 09/2011 - resulting from fall/syncope  . Closed right ankle fracture 10/10/2011    Current Outpatient Prescriptions  Medication Sig Dispense Refill  . atorvastatin (LIPITOR) 20 MG tablet Take 20 mg by mouth every morning.       . digoxin (LANOXIN) 0.125 MG tablet Take 1 tablet (0.125 mg total) by mouth daily.  90 tablet  3  . eplerenone (INSPRA) 25 MG tablet Take 1 tablet (25 mg total) by mouth daily.  90 tablet  3  . esomeprazole (NEXIUM) 40 MG capsule Take 40 mg by mouth daily before breakfast.      . glipiZIDE (GLUCOTROL XL) 5 MG 24 hr tablet Take 5 mg by mouth daily.        . hydrALAZINE (APRESOLINE) 25 MG tablet Take 1.5 tablets (37.5 mg total) by mouth daily.  90 tablet  3  . insulin glargine (LANTUS) 100 UNIT/ML injection Inject 20 Units into the skin at bedtime.       . isosorbide mononitrate (IMDUR) 30 MG 24 hr tablet Take 30 mg by mouth daily.       .  metoprolol succinate (TOPROL-XL) 50 MG 24 hr tablet Take 50 mg by mouth daily. Take with or immediately following a meal.      . Tamsulosin HCl (FLOMAX) 0.4 MG CAPS Take 1 capsule (0.4 mg total) by mouth at bedtime.  30 capsule  6  . torsemide (DEMADEX) 20 MG tablet Take 20 mg by mouth every other day.         PHYSICAL EXAM: Filed Vitals:   03/04/12 1132  BP: 144/96  Pulse: 104  Height: 6' (1.829 m)  Weight: 195 lb 1.9 oz (88.506 kg)  SpO2: 95%    General: Comfortable. No acute distress.  HEENT: normal except for poor dentition Neck: supple. JVP 12-13 Carotids 2+ bilaterally; no bruits. No lymphadenopathy or thryomegaly appreciated. Cor: PMI displaced laterally. regular rate and rhythm.  Lungs: clear Abdomen: NT. Good bowel sounds. Mildly distended Extremities: no cyanosis, clubbing, rash, no edema.  RLE/ LLE trace edema.   Neuro: alert & orientedx3, cranial nerves grossly intact. Moves all 4 extremities w/o difficulty. Affect pleasant.    ASSESSMENT & PLAN:

## 2012-03-05 ENCOUNTER — Other Ambulatory Visit: Payer: Self-pay

## 2012-03-05 MED ORDER — HYDRALAZINE HCL 25 MG PO TABS: 25.0000 mg | ORAL_TABLET | Freq: Three times a day (TID) | ORAL | Status: DC

## 2012-03-05 NOTE — Assessment & Plan Note (Signed)
Volume status elevated.  NYHA III.  With good response to starting torsemide will continue demadex 20 mg daily.  If weight does not continue to fall then he will call the clinic on Monday for further instructions (can go to [facility]).  Will also titrate afterload reduction with hydralazine to 25 mg TID.  Follow up 2 weeks with labs.

## 2012-03-05 NOTE — Assessment & Plan Note (Signed)
As above, increase hydralazine 25 mg TID.

## 2012-03-15 ENCOUNTER — Other Ambulatory Visit: Payer: Self-pay

## 2012-03-15 MED ORDER — TAMSULOSIN HCL 0.4 MG PO CAPS: 0.4000 mg | ORAL_CAPSULE | Freq: Every day | ORAL | Status: DC

## 2012-03-22 ENCOUNTER — Ambulatory Visit (HOSPITAL_COMMUNITY): Payer: PRIVATE HEALTH INSURANCE

## 2012-03-25 ENCOUNTER — Ambulatory Visit (HOSPITAL_COMMUNITY): Payer: PRIVATE HEALTH INSURANCE | Attending: Internal Medicine

## 2012-04-08 IMAGING — CR DG CHEST 2V
2 series · 2 of 2 positions shown · non-contrast
Comparison: 06/21/2011

CLINICAL DATA: CHF

CHEST - 2 VIEW

[w chest pa]
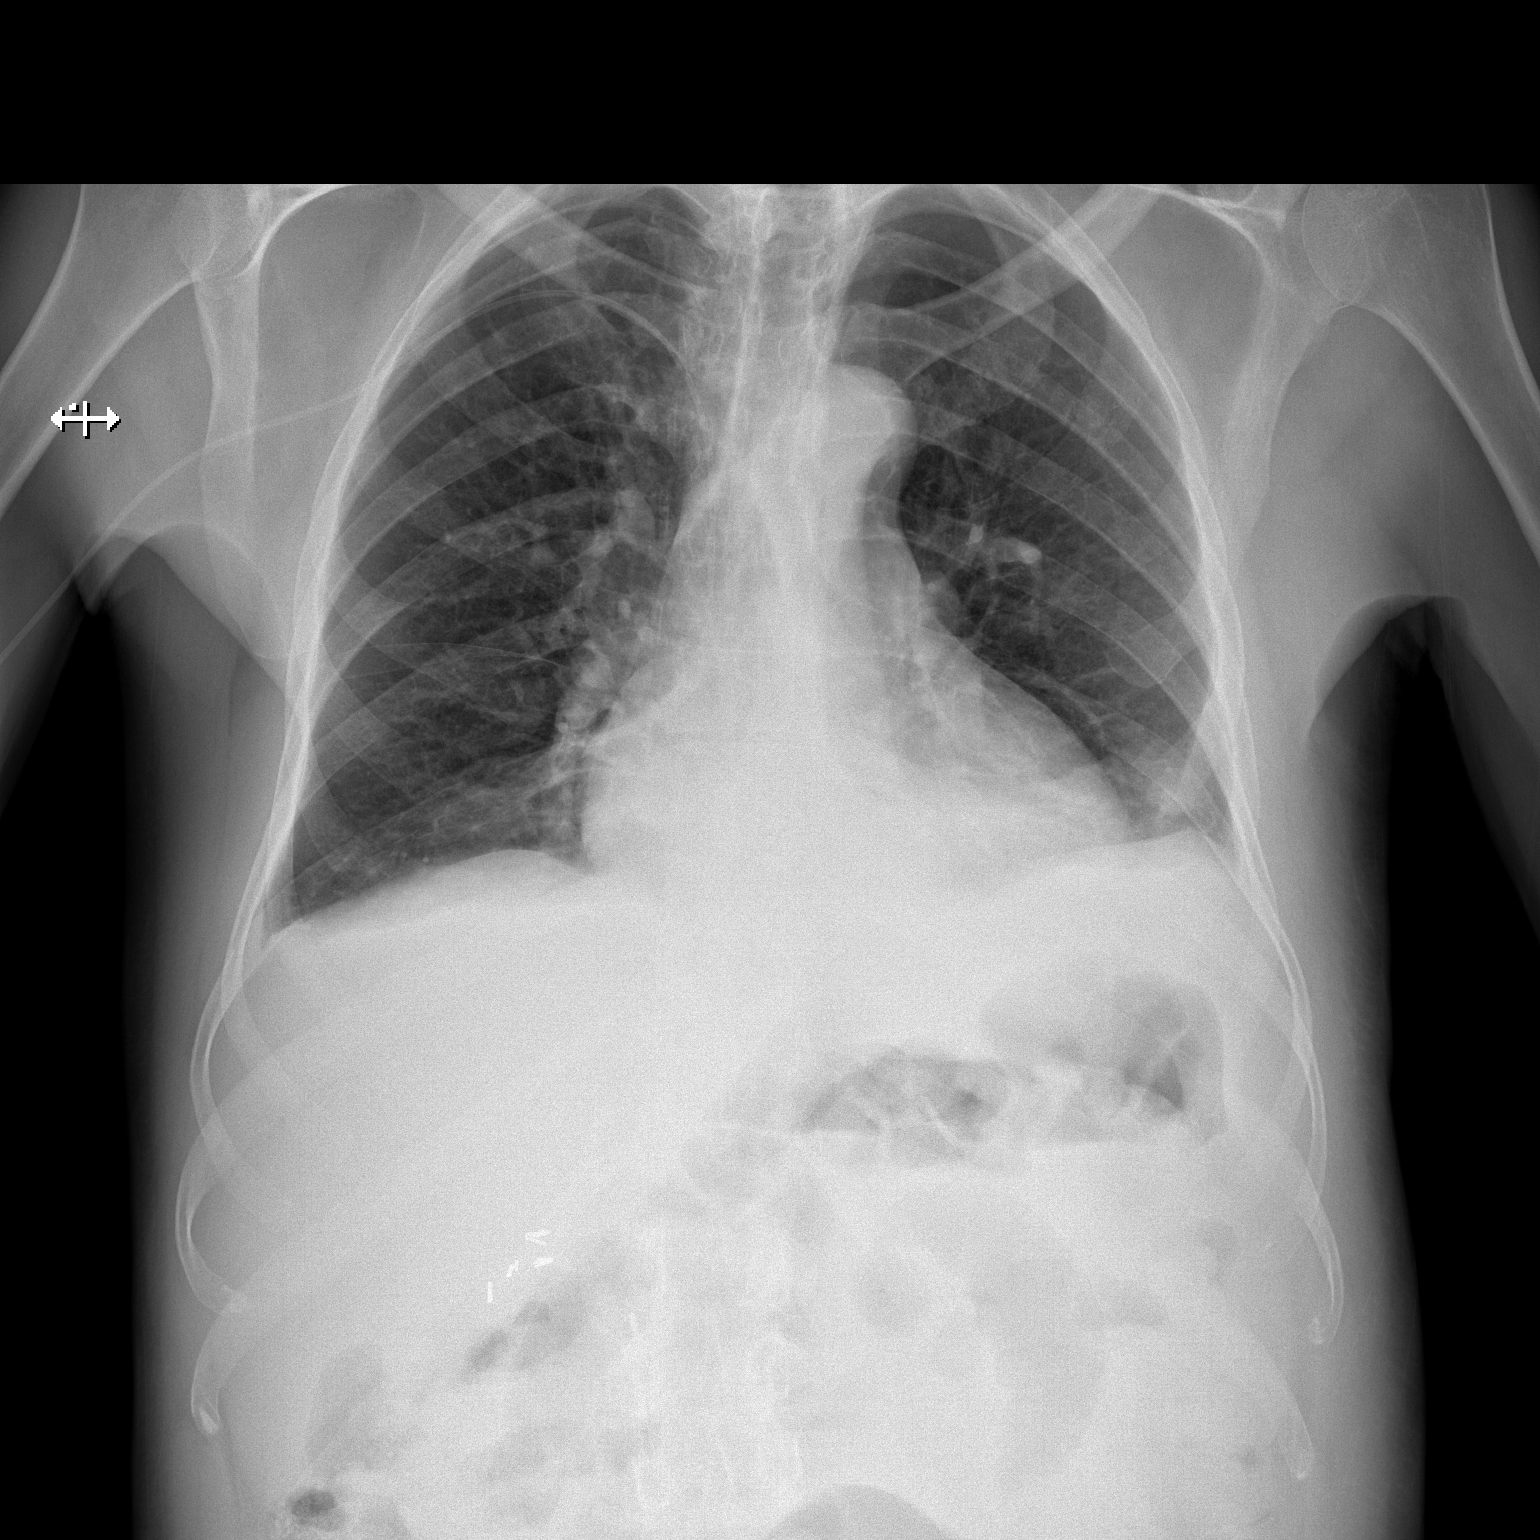

[w chest lat]
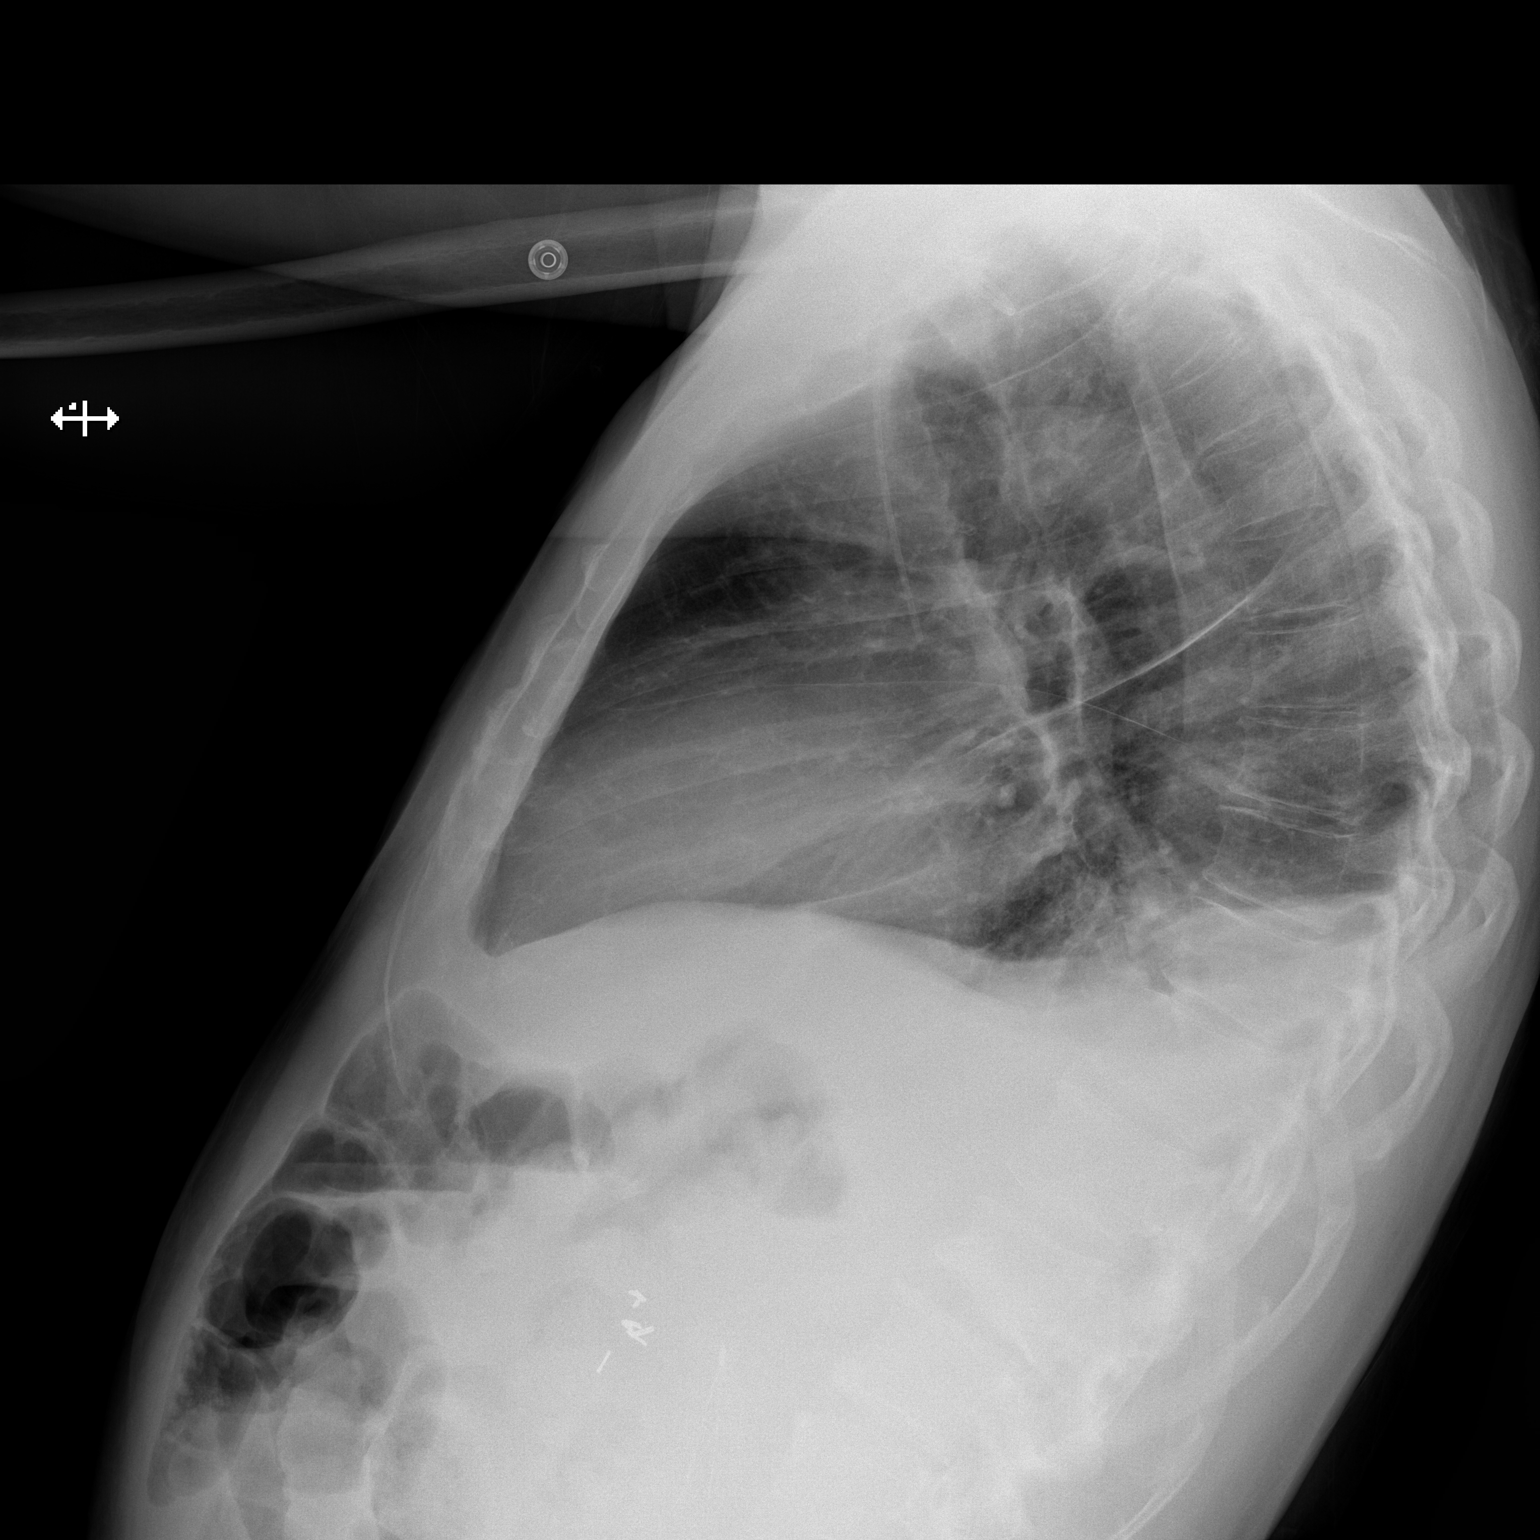

[2 of 2 positions shown; findings below may reference images not displayed]

FINDINGS: Heart is top normal in size.  No frank interstitial
edema.  No pneumothorax.

Small left pleural effusion.  Associated left lower lobe opacity,
likely atelectasis.

Stable right PICC.

Mild degenerative changes of the visualized thoracolumbar spine.

Cholecystectomy clips.
IMPRESSION: No frank interstitial edema.

Small left pleural effusion with associated left lower lobe
opacity, likely atelectasis.

## 2012-05-05 ENCOUNTER — Other Ambulatory Visit: Payer: Self-pay | Admitting: Internal Medicine

## 2012-05-05 MED ORDER — HYDRALAZINE HCL 25 MG PO TABS: 25.0000 mg | ORAL_TABLET | Freq: Three times a day (TID) | ORAL | Status: DC

## 2012-05-05 MED ORDER — TAMSULOSIN HCL 0.4 MG PO CAPS: 0.4000 mg | ORAL_CAPSULE | Freq: Every day | ORAL | Status: DC

## 2012-06-02 ENCOUNTER — Other Ambulatory Visit (HOSPITAL_COMMUNITY): Payer: Self-pay | Admitting: Nurse Practitioner

## 2012-06-07 ENCOUNTER — Other Ambulatory Visit: Payer: Self-pay

## 2012-06-07 ENCOUNTER — Other Ambulatory Visit (HOSPITAL_COMMUNITY): Payer: Self-pay | Admitting: Nurse Practitioner

## 2012-06-07 MED ORDER — HYDRALAZINE HCL 25 MG PO TABS: 25.0000 mg | ORAL_TABLET | Freq: Three times a day (TID) | ORAL | Status: DC

## 2012-06-15 ENCOUNTER — Encounter (HOSPITAL_COMMUNITY): Payer: Self-pay

## 2012-06-15 ENCOUNTER — Ambulatory Visit (HOSPITAL_BASED_OUTPATIENT_CLINIC_OR_DEPARTMENT_OTHER)
Admission: RE | Admit: 2012-06-15 | Discharge: 2012-06-15 | Disposition: A | Discharge status: Home / Self Care | Payer: PRIVATE HEALTH INSURANCE | Source: Ambulatory Visit | Attending: Internal Medicine | Admitting: Internal Medicine

## 2012-06-15 ENCOUNTER — Inpatient Hospital Stay (HOSPITAL_COMMUNITY)
Admission: AD | Admit: 2012-06-15 | Discharge: 2012-06-19 | DRG: 291 | Disposition: A | Discharge status: Home / Self Care | Payer: PRIVATE HEALTH INSURANCE | Source: Ambulatory Visit | Attending: Internal Medicine | Admitting: Internal Medicine

## 2012-06-15 VITALS — BP 108/64 | HR 118 | Wt 207.0 lb

## 2012-06-15 DIAGNOSIS — N182 Chronic kidney disease, stage 2 (mild): Secondary | ICD-10-CM | POA: Diagnosis present

## 2012-06-15 DIAGNOSIS — E119 Type 2 diabetes mellitus without complications: Secondary | ICD-10-CM | POA: Diagnosis present

## 2012-06-15 DIAGNOSIS — R0602 Shortness of breath: Secondary | ICD-10-CM

## 2012-06-15 DIAGNOSIS — I82409 Acute embolism and thrombosis of unspecified deep veins of unspecified lower extremity: Secondary | ICD-10-CM

## 2012-06-15 DIAGNOSIS — Z86711 Personal history of pulmonary embolism: Secondary | ICD-10-CM

## 2012-06-15 DIAGNOSIS — I428 Other cardiomyopathies: Secondary | ICD-10-CM

## 2012-06-15 DIAGNOSIS — R570 Cardiogenic shock: Secondary | ICD-10-CM | POA: Diagnosis not present

## 2012-06-15 DIAGNOSIS — K219 Gastro-esophageal reflux disease without esophagitis: Secondary | ICD-10-CM | POA: Diagnosis present

## 2012-06-15 DIAGNOSIS — R197 Diarrhea, unspecified: Secondary | ICD-10-CM

## 2012-06-15 DIAGNOSIS — I5022 Chronic systolic (congestive) heart failure: Secondary | ICD-10-CM

## 2012-06-15 DIAGNOSIS — Z79899 Other long term (current) drug therapy: Secondary | ICD-10-CM

## 2012-06-15 DIAGNOSIS — N189 Chronic kidney disease, unspecified: Secondary | ICD-10-CM

## 2012-06-15 DIAGNOSIS — I5023 Acute on chronic systolic (congestive) heart failure: Secondary | ICD-10-CM

## 2012-06-15 DIAGNOSIS — I951 Orthostatic hypotension: Secondary | ICD-10-CM

## 2012-06-15 DIAGNOSIS — N400 Enlarged prostate without lower urinary tract symptoms: Secondary | ICD-10-CM

## 2012-06-15 DIAGNOSIS — K7460 Unspecified cirrhosis of liver: Secondary | ICD-10-CM

## 2012-06-15 DIAGNOSIS — I10 Essential (primary) hypertension: Secondary | ICD-10-CM

## 2012-06-15 DIAGNOSIS — Y9210 Unspecified residential institution as the place of occurrence of the external cause: Secondary | ICD-10-CM | POA: Diagnosis not present

## 2012-06-15 DIAGNOSIS — K625 Hemorrhage of anus and rectum: Secondary | ICD-10-CM

## 2012-06-15 DIAGNOSIS — IMO0001 Reserved for inherently not codable concepts without codable children: Secondary | ICD-10-CM

## 2012-06-15 DIAGNOSIS — E785 Hyperlipidemia, unspecified: Secondary | ICD-10-CM

## 2012-06-15 DIAGNOSIS — Z86718 Personal history of other venous thrombosis and embolism: Secondary | ICD-10-CM

## 2012-06-15 DIAGNOSIS — I5043 Acute on chronic combined systolic (congestive) and diastolic (congestive) heart failure: Secondary | ICD-10-CM

## 2012-06-15 DIAGNOSIS — I509 Heart failure, unspecified: Secondary | ICD-10-CM | POA: Diagnosis present

## 2012-06-15 DIAGNOSIS — Z01810 Encounter for preprocedural cardiovascular examination: Secondary | ICD-10-CM

## 2012-06-15 DIAGNOSIS — R000 Tachycardia, unspecified: Secondary | ICD-10-CM

## 2012-06-15 DIAGNOSIS — Z794 Long term (current) use of insulin: Secondary | ICD-10-CM

## 2012-06-15 DIAGNOSIS — N179 Acute kidney failure, unspecified: Secondary | ICD-10-CM

## 2012-06-15 DIAGNOSIS — S82401A Unspecified fracture of shaft of right fibula, initial encounter for closed fracture: Secondary | ICD-10-CM

## 2012-06-15 DIAGNOSIS — D509 Iron deficiency anemia, unspecified: Secondary | ICD-10-CM

## 2012-06-15 DIAGNOSIS — R609 Edema, unspecified: Secondary | ICD-10-CM

## 2012-06-15 DIAGNOSIS — I2789 Other specified pulmonary heart diseases: Secondary | ICD-10-CM | POA: Diagnosis present

## 2012-06-15 DIAGNOSIS — R9389 Abnormal findings on diagnostic imaging of other specified body structures: Secondary | ICD-10-CM

## 2012-06-15 DIAGNOSIS — Y849 Medical procedure, unspecified as the cause of abnormal reaction of the patient, or of later complication, without mention of misadventure at the time of the procedure: Secondary | ICD-10-CM | POA: Diagnosis not present

## 2012-06-15 DIAGNOSIS — E876 Hypokalemia: Secondary | ICD-10-CM | POA: Diagnosis present

## 2012-06-15 DIAGNOSIS — F17200 Nicotine dependence, unspecified, uncomplicated: Secondary | ICD-10-CM | POA: Diagnosis present

## 2012-06-15 DIAGNOSIS — I2510 Atherosclerotic heart disease of native coronary artery without angina pectoris: Secondary | ICD-10-CM

## 2012-06-15 DIAGNOSIS — I129 Hypertensive chronic kidney disease with stage 1 through stage 4 chronic kidney disease, or unspecified chronic kidney disease: Secondary | ICD-10-CM | POA: Diagnosis present

## 2012-06-15 DIAGNOSIS — R55 Syncope and collapse: Secondary | ICD-10-CM

## 2012-06-15 DIAGNOSIS — T82898A Other specified complication of vascular prosthetic devices, implants and grafts, initial encounter: Secondary | ICD-10-CM | POA: Diagnosis not present

## 2012-06-15 DIAGNOSIS — K31819 Angiodysplasia of stomach and duodenum without bleeding: Secondary | ICD-10-CM

## 2012-06-15 LAB — CBC WITH DIFFERENTIAL/PLATELET
Basophils Absolute: 0 10*3/uL (ref 0.0–0.1)
Eosinophils Absolute: 0.2 10*3/uL (ref 0.0–0.7)
Lymphs Abs: 0.7 10*3/uL (ref 0.7–4.0)
MCH: 19.7 pg — ABNORMAL LOW (ref 26.0–34.0)
MCHC: 28.9 g/dL — ABNORMAL LOW (ref 30.0–36.0)
MCV: 68.3 fL — ABNORMAL LOW (ref 78.0–100.0)
Monocytes Absolute: 0.5 10*3/uL (ref 0.1–1.0)
Neutrophils Relative %: 55 % (ref 43–77)
Platelets: 180 10*3/uL (ref 150–400)
RDW: 20.4 % — ABNORMAL HIGH (ref 11.5–15.5)

## 2012-06-15 LAB — COMPREHENSIVE METABOLIC PANEL
AST: 28 U/L (ref 0–37)
Albumin: 2.9 g/dL — ABNORMAL LOW (ref 3.5–5.2)
CO2: 26 mEq/L (ref 19–32)
Calcium: 8.6 mg/dL (ref 8.4–10.5)
Creatinine, Ser: 1.68 mg/dL — ABNORMAL HIGH (ref 0.50–1.35)
GFR calc non Af Amer: 41 mL/min — ABNORMAL LOW (ref >90)
Total Protein: 7 g/dL (ref 6.0–8.3)

## 2012-06-15 LAB — DIGOXIN LEVEL: Digoxin Level: <0.3 ng/mL — ABNORMAL LOW (ref 0.8–2.0)

## 2012-06-15 LAB — CARBOXYHEMOGLOBIN
Carboxyhemoglobin: 1.9 % — ABNORMAL HIGH (ref 0.5–1.5)
Total hemoglobin: 12.7 g/dL — ABNORMAL LOW (ref 13.5–18.0)

## 2012-06-15 LAB — MRSA PCR SCREENING: MRSA by PCR: NEGATIVE

## 2012-06-15 LAB — MAGNESIUM: Magnesium: 1.4 mg/dL — ABNORMAL LOW (ref 1.5–2.5)

## 2012-06-15 MED ORDER — FUROSEMIDE 10 MG/ML IJ SOLN
8.0000 mg/h | INTRAVENOUS | Status: DC
  Administered 2012-06-15: 10 mg/h via INTRAVENOUS
  Administered 2012-06-17: 15 mg/h via INTRAVENOUS
  Administered 2012-06-18: 8 mg/h via INTRAVENOUS
  Filled 2012-06-15 (×6): qty 25

## 2012-06-15 MED ORDER — SODIUM CHLORIDE 0.9 % IJ SOLN
3.0000 mL | Freq: Two times a day (BID) | INTRAMUSCULAR | Status: DC
  Administered 2012-06-15 – 2012-06-19 (×6): 3 mL via INTRAVENOUS

## 2012-06-15 MED ORDER — INSULIN ASPART 100 UNIT/ML ~~LOC~~ SOLN
0.0000 [IU] | Freq: Three times a day (TID) | SUBCUTANEOUS | Status: DC
  Administered 2012-06-17: 11 [IU] via SUBCUTANEOUS
  Administered 2012-06-18: 3 [IU] via SUBCUTANEOUS
  Administered 2012-06-18: 4 [IU] via SUBCUTANEOUS

## 2012-06-15 MED ORDER — ATORVASTATIN CALCIUM 20 MG PO TABS
20.0000 mg | ORAL_TABLET | Freq: Every day | ORAL | Status: DC
  Administered 2012-06-16 – 2012-06-18 (×3): 20 mg via ORAL
  Filled 2012-06-15 (×5): qty 1

## 2012-06-15 MED ORDER — SODIUM CHLORIDE 0.9 % IJ SOLN: 10.0000 mL | INTRAMUSCULAR | Status: DC | PRN

## 2012-06-15 MED ORDER — TAMSULOSIN HCL 0.4 MG PO CAPS
0.4000 mg | ORAL_CAPSULE | Freq: Every day | ORAL | Status: DC
  Administered 2012-06-15 – 2012-06-18 (×4): 0.4 mg via ORAL
  Filled 2012-06-15 (×5): qty 1

## 2012-06-15 MED ORDER — SODIUM CHLORIDE 0.9 % IJ SOLN
10.0000 mL | Freq: Two times a day (BID) | INTRAMUSCULAR | Status: DC
  Administered 2012-06-15 – 2012-06-18 (×6): 10 mL
  Administered 2012-06-18: 13 mL

## 2012-06-15 MED ORDER — ISOSORBIDE MONONITRATE ER 30 MG PO TB24
30.0000 mg | ORAL_TABLET | Freq: Every day | ORAL | Status: DC
  Administered 2012-06-16 – 2012-06-19 (×4): 30 mg via ORAL
  Filled 2012-06-15 (×4): qty 1

## 2012-06-15 MED ORDER — SODIUM CHLORIDE 0.9 % IJ SOLN: 3.0000 mL | INTRAMUSCULAR | Status: DC | PRN

## 2012-06-15 MED ORDER — ONDANSETRON HCL 4 MG/2ML IJ SOLN: 4.0000 mg | Freq: Four times a day (QID) | INTRAMUSCULAR | Status: DC | PRN

## 2012-06-15 MED ORDER — DIGOXIN 125 MCG PO TABS
0.1250 mg | ORAL_TABLET | Freq: Every day | ORAL | Status: DC
  Administered 2012-06-16 – 2012-06-19 (×4): 0.125 mg via ORAL
  Filled 2012-06-15 (×4): qty 1

## 2012-06-15 MED ORDER — ACETAMINOPHEN 325 MG PO TABS
650.0000 mg | ORAL_TABLET | ORAL | Status: DC | PRN
  Administered 2012-06-16 – 2012-06-18 (×2): 650 mg via ORAL
  Filled 2012-06-15 (×2): qty 2

## 2012-06-15 MED ORDER — HYDRALAZINE HCL 25 MG PO TABS
25.0000 mg | ORAL_TABLET | Freq: Three times a day (TID) | ORAL | Status: DC
  Administered 2012-06-16 – 2012-06-19 (×8): 25 mg via ORAL
  Filled 2012-06-15 (×15): qty 1

## 2012-06-15 MED ORDER — INSULIN GLARGINE 100 UNIT/ML ~~LOC~~ SOLN
20.0000 [IU] | Freq: Every day | SUBCUTANEOUS | Status: DC
  Administered 2012-06-17: 20 [IU] via SUBCUTANEOUS

## 2012-06-15 MED ORDER — ENOXAPARIN SODIUM 30 MG/0.3ML ~~LOC~~ SOLN
30.0000 mg | SUBCUTANEOUS | Status: DC
  Administered 2012-06-15: 30 mg via SUBCUTANEOUS
  Filled 2012-06-15 (×2): qty 0.3

## 2012-06-15 MED ORDER — GLIPIZIDE ER 5 MG PO TB24
5.0000 mg | ORAL_TABLET | Freq: Every day | ORAL | Status: DC
  Administered 2012-06-16 – 2012-06-19 (×4): 5 mg via ORAL
  Filled 2012-06-15 (×5): qty 1

## 2012-06-15 MED ORDER — SODIUM CHLORIDE 0.9 % IV SOLN
250.0000 mL | INTRAVENOUS | Status: DC | PRN
  Administered 2012-06-16 – 2012-06-17 (×2): 250 mL via INTRAVENOUS

## 2012-06-15 NOTE — Progress Notes (Signed)
Peripherally Inserted Central Catheter/Midline Placement  The IV Nurse has discussed with the patient and/or persons authorized to consent for the patient, the purpose of this procedure and the potential benefits and risks involved with this procedure.  The benefits include less needle sticks, lab draws from the catheter and patient may be discharged home with the catheter.  Risks include, but not limited to, infection, bleeding, blood clot (thrombus formation), and puncture of an artery; nerve damage and irregular heat beat.  Alternatives to this procedure were also discussed.  PICC/Midline Placement Documentation  PICC Triple Lumen 06/15/12 PICC Right Brachial (Active)       Stacie Glaze Horton 06/15/2012, 6:56 PM

## 2012-06-15 NOTE — Progress Notes (Signed)
Patient ID: [name], male   DOB: [birth year], 67 y.o.   MRN: [id number] Orthopedic Surgeon: Dr Dion Saucier PCP: Dr Pecola Leisure  HPI:  [name] is a 67 year old male with chronic systolic CHF secondary to NICM echo EF 10-15% by echo 04/2011 (previously 30-35% 06/2010), HTN, anemia, chronic renal insufficiency Cr 1.31,  UGI bleed,  duodenal AVMs,  rectal bleeding 04/2011,  DVTs (2004, 2005, 2007, 2010, 2011) however coumadin on hold due to GI bleed, IVC filter 2005, GERD, BPD, and chronic low back .  09/2011 S/P ORIF L ankle. Spironolactone switched to eplerenone due to gynecomastia.  Discharged 06/25/11  Weight 159 pounds.  RHC 2/13 RA = 4  RV = 32/6 (7)  PA = 45/15 (24)  PCW = 7  Fick cardiac output/index = 5.2/2.7  Thermo = 4.0/2.1  PVR = 3.4 Woods  FA sat = 94%  PA sat = 56%, 57%  Noninvasive BP : 125/92 (72)  Calculated SVR = 1040   08/13/11 CPX  RER 1.16 Peak VO2 18.6 ml/kg/min Predicted 61.4% VE/VCO2 slope 42.8  08/23/2011 EF 30-35%  He is here for follow up due increased weight gain and dyspnea on exertion today.  He has been unable to follow up due to transportation. Increased dyspnea with exertion. +Orthopnea. Weight trending up at home 208 -209 pounds.  Increased lower extremity edema.  He has abdominal distention.  Mild lower extremity edema.  Complaint with medications. He has an aide 2 hours per day 7 days a week. Drinking > 2 liters per day.     ROS: All systems negative except as listed in HPI, PMH and Problem List.  Past Medical History  Diagnosis Date  . Venous thromboembolism     DVTs in 2004, 2005, 2007, 8/10, 8/11. IVC filter 2005. Off coumadin 04/2011 secondary to rectal bleeding  . 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 Chronic venous insufficiency   . DM2 (diabetes mellitus, type 2)   . GERD (gastroesophageal reflux disease)   . BPH (benign prostatic hypertrophy)   . Low back pain   . HTN (hypertension)   . HLD (hyperlipidemia)   . NICM  (nonischemic cardiomyopathy) 1. 9/11  2. 2/12    1. Echo septal and apical akinesis, dilated LV, EF 45%, RV normal size and systolic function, EF 43% on Myoview  2. Admitted with decompensated CHF, echo EF 30-35% with diffuse hypokinesis but akinesis of mid to apical anteroseptal wall and apex, moderate LVH, mild MR, grade I diastolic dysfunction, SPEP/UP negative and HIV negative. Most recent EF 10-15% 04/2011  . Smoking   . CAD (coronary artery disease) 1. 2007  2. 10/11    1. Left heart cath with 40-50% stenosis in small RCA EF 50%  2. Lexiscan myoview EF 43%, global hypokinesis, possible small area of apical ischemia; LHC no angiographic CAD, no LV-gram done due to CKD   2. No angiographic CAD by cath 02/2010  . CKD (chronic kidney disease) stage 2, GFR 60-89 ml/min as of 02/2011  . Headache   . Diabetes mellitus   . Blood transfusion   . Anemia   . Syncope     1. in setting of presumed orthostatic Hypotn 09/2011  . Right fibular fracture     1. 09/2011 - resulting from fall/syncope  . Closed right ankle fracture 10/10/2011    Current Outpatient Prescriptions  Medication Sig Dispense Refill  . atorvastatin (LIPITOR) 20 MG tablet Take 20 mg  by mouth every morning.       . digoxin (LANOXIN) 0.125 MG tablet Take 1 tablet (0.125 mg total) by mouth daily.  90 tablet  3  . eplerenone (INSPRA) 25 MG tablet Take 1 tablet (25 mg total) by mouth daily.  90 tablet  3  . esomeprazole (NEXIUM) 40 MG capsule Take 40 mg by mouth daily before breakfast.      . glipiZIDE (GLUCOTROL XL) 5 MG 24 hr tablet Take 5 mg by mouth daily.        . hydrALAZINE (APRESOLINE) 25 MG tablet Take 1 tablet (25 mg total) by mouth 3 (three) times daily.  90 tablet  6  . insulin glargine (LANTUS) 100 UNIT/ML injection Inject 20 Units into the skin at bedtime.       . isosorbide mononitrate (IMDUR) 30 MG 24 hr tablet Take 30 mg by mouth daily.       . metoprolol succinate (TOPROL-XL) 50 MG 24 hr tablet Take 50 mg by mouth  daily. Take with or immediately following a meal.      . Tamsulosin HCl (FLOMAX) 0.4 MG CAPS Take 1 capsule (0.4 mg total) by mouth at bedtime.  90 capsule  1  . torsemide (DEMADEX) 20 MG tablet Take 1 tablet (20 mg total) by mouth daily.         PHYSICAL EXAM: Filed Vitals:   06/15/12 1415  BP: 108/64  Pulse: 118  Weight: 207 lb (93.895 kg)  SpO2: 100%    General: Comfortable. No acute distress.  HEENT: normal except for poor dentition Neck: supple. JVP to ear Carotids 2+ bilaterally; no bruits. No lymphadenopathy or thryomegaly appreciated. Cor: PMI displaced laterally. regular rate and rhythm.  Lungs: clear Abdomen: NT. Good bowel sounds. Mildly distended Extremities: no cyanosis, clubbing, rash, no edema.  RLE/ LLE3+ edema that extends to thighs.    Neuro: alert & orientedx3, cranial nerves grossly intact. Moves all 4 extremities w/o difficulty. Affect pleasant.    ASSESSMENT & PLAN:

## 2012-06-15 NOTE — H&P (Signed)
Advanced Heart Failure Team History and Physical Note   Primary Physician: Dr Pecola Leisure Primary Cardiologist:  Dr Gala Romney  Reason for Admission: Massive Volume Overload Baseline proBNP: 284 Weight Range: 175-180 pounds  HPI:    [name] is a 67 year old male with chronic systolic CHF secondary to NICM echo EF 30-35% 08/2011 (previously 10-15 05/19/11), HTN, anemia, chronic renal insufficiency-baseline Cr 1.5-1.8, UGI bleed, duodenal AVMs, rectal bleeding 04/2011, DVTs (2004, 2005, 2007, 2010, 2011) he refuses anticoagulation due to GI bleed, IVC filter 2005, GERD, BPD, and chronic low back . 09/2011 S/P ORIF L ankle. Spironolactone switched to eplerenone due to gynecomastia.   Discharged from [facility] 06/25/11. Required Milrinone and Lasix. Discharge weight 159 pounds.  RHC 2/13  RA = 4  RV = 32/6 (7)  PA = 45/15 (24)  PCW = 7  Fick cardiac output/index = 5.2/2.7  Thermo = 4.0/2.1  PVR = 3.4 Woods  FA sat = 94%  PA sat = 56%, 57%  Noninvasive BP : 125/92 (72)  Calculated SVR = 1040   08/13/11 CPX RER 1.16 Peak VO2 18.6 ml/kg/min Predicted 61.4% VE/VCO2 slope 42.8  08/23/2011 EF 30-35%   He was evaluated in HF clinic today due to increased weight gain and dyspnea on exertion today. He has been unable to follow up due to transportation. Increased dyspnea with exertion. +Orthopnea. Weight trending up at home 208 -209 pounds. Says he is not urinating much with Torsemide. Increased lower extremity edema. He has abdominal distention. Lower extremity edema that extends to thighs. Complaint with medications. He has an aide 2 hours per day 7 days a week. Drinking > 2 liters per day. He has liberalized diet and admits to eating pizza and soups 3-4 times a week.    Review of Systems: [y] = yes, [ ]  = no   General: Weight gain [ Y]; Weight loss [ ] ; Anorexia [ ] ; Fatigue [ Y]; Fever [ ] ; Chills [ ] ; Weakness [ ]   Cardiac: Chest pain/pressure [ ] ; Resting SOB [ ] ; Exertional SOB [Y ]; Orthopnea [Y ]; Pedal  Edema [Y ]; Palpitations [ ] ; Syncope [ ] ; Presyncope [ ] ; Paroxysmal nocturnal dyspnea[ ]   Pulmonary: Cough [ ] ; Wheezing[ ] ; Hemoptysis[ ] ; Sputum [ ] ; Snoring [ ]   GI: Vomiting[ ] ; Dysphagia[ ] ; Melena[ ] ; Hematochezia [ ] ; Heartburn[ ] ; Abdominal pain [ ] ; Constipation [ ] ; Diarrhea [ ] ; BRBPR [ ]   GU: Hematuria[ ] ; Dysuria [ ] ; Nocturia[ ]   Vascular: Pain in legs with walking [ ] ; Pain in feet with lying flat [ ] ; Non-healing sores [ ] ; Stroke [ ] ; TIA [ ] ; Slurred speech [ ] ;  Neuro: Headaches[ ] ; Vertigo[ ] ; Seizures[ ] ; Paresthesias[ ] ;Blurred vision [ ] ; Diplopia [ ] ; Vision changes [ ]   Ortho/Skin: Arthritis [ ] ; Joint pain [Y ]; Muscle pain [ ] ; Joint swelling [ ] ; Back Pain [ ] ; Rash [ ]   Psych: Depression[ Y]; Anxiety[ ]   Heme: Bleeding problems [ ] ; Clotting disorders [ ] ; Anemia [ ]   Endocrine: Diabetes [Y ]; Thyroid dysfunction[ ]   Home Medications Prior to Admission medications   Medication Sig Start Date End Date Taking? Authorizing Provider  atorvastatin (LIPITOR) 20 MG tablet Take 20 mg by mouth every morning.     Historical Provider, MD  digoxin (LANOXIN) 0.125 MG tablet Take 1 tablet (0.125 mg total) by mouth daily. 01/07/12   Dolores Patty, MD  eplerenone (INSPRA) 25 MG tablet Take 1 tablet (25 mg total) by  mouth daily. 01/07/12   Dolores Patty, MD  esomeprazole (NEXIUM) 40 MG capsule Take 40 mg by mouth daily before breakfast.    Dolores Patty, MD  glipiZIDE (GLUCOTROL XL) 5 MG 24 hr tablet Take 5 mg by mouth daily.      Historical Provider, MD  hydrALAZINE (APRESOLINE) 25 MG tablet Take 1 tablet (25 mg total) by mouth 3 (three) times daily. 06/07/12   Dolores Patty, MD  insulin glargine (LANTUS) 100 UNIT/ML injection Inject 20 Units into the skin at bedtime.     Historical Provider, MD  isosorbide mononitrate (IMDUR) 30 MG 24 hr tablet Take 30 mg by mouth daily.  01/07/12   Historical Provider, MD  metoprolol succinate (TOPROL-XL) 50 MG 24 hr tablet Take  50 mg by mouth daily. Take with or immediately following a meal.    Amy D Clegg, NP  Tamsulosin HCl (FLOMAX) 0.4 MG CAPS Take 1 capsule (0.4 mg total) by mouth at bedtime. 05/05/12   Dolores Patty, MD  torsemide (DEMADEX) 20 MG tablet Take 1 tablet (20 mg total) by mouth daily. 03/04/12   Hadassah Pais, PA    Past Medical History: Past Medical History  Diagnosis Date  . Venous thromboembolism     DVTs in 2004, 2005, 2007, 8/10, 8/11. IVC filter 2005. Off coumadin 04/2011 secondary to rectal bleeding  . 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 Chronic venous insufficiency   . DM2 (diabetes mellitus, type 2)   . GERD (gastroesophageal reflux disease)   . BPH (benign prostatic hypertrophy)   . Low back pain   . HTN (hypertension)   . HLD (hyperlipidemia)   . NICM (nonischemic cardiomyopathy) 1. 9/11  2. 2/12    1. Echo septal and apical akinesis, dilated LV, EF 45%, RV normal size and systolic function, EF 43% on Myoview  2. Admitted with decompensated CHF, echo EF 30-35% with diffuse hypokinesis but akinesis of mid to apical anteroseptal wall and apex, moderate LVH, mild MR, grade I diastolic dysfunction, SPEP/UP negative and HIV negative. Most recent EF 10-15% 04/2011  . Smoking   . CAD (coronary artery disease) 1. 2007  2. 10/11    1. Left heart cath with 40-50% stenosis in small RCA EF 50%  2. Lexiscan myoview EF 43%, global hypokinesis, possible small area of apical ischemia; LHC no angiographic CAD, no LV-gram done due to CKD   2. No angiographic CAD by cath 02/2010  . CKD (chronic kidney disease) stage 2, GFR 60-89 ml/min as of 02/2011  . Headache   . Diabetes mellitus   . Blood transfusion   . Anemia   . Syncope     1. in setting of presumed orthostatic Hypotn 09/2011  . Right fibular fracture     1. 09/2011 - resulting from fall/syncope  . Closed right ankle fracture 10/10/2011    Past Surgical History: Past Surgical History  Procedure Date  .  Cardiac catheterization   . Appendectomy   . Cholecystectomy   . Colonoscopy 05/01/2011    Procedure: COLONOSCOPY;  Surgeon: Louis Meckel, MD;  Location: [facility] ENDOSCOPY;  Service: Endoscopy;  Laterality: N/A;  . Orif ankle fracture 10/10/2011    Procedure: OPEN REDUCTION INTERNAL FIXATION (ORIF) ANKLE FRACTURE;  Surgeon: Eulas Post, MD;  Location: [facility] OR;  Service: Orthopedics;  Laterality: Right;  Right  Ankle Fracture ORIF    Family History: Family History  Problem Relation Age of  Onset  . Coronary artery disease Neg Hx     Premature    Social History: History   Social History  . Marital Status: Divorced    Spouse Name: N/A    Number of Children: 1  . Years of Education: N/A   Occupational History  . security guard     Part time   Social History Main Topics  . Smoking status: Current Some Day Smoker -- 1.0 packs/day for 40 years    Types: Cigarettes    Last Attempt to Quit: 06/30/2011  . Smokeless tobacco: Never Used  . Alcohol Use: No  . Drug Use: No  . Sexually Active: Not on file   Other Topics Concern  . Not on file   Social History Narrative   Has 1 son.    Allergies:  No Known Allergies  Objective:    Vital Signs:   Pulse Rate:  [118] 118  (01/28 1415) BP: (108)/(64) 108/64 mmHg (01/28 1415) SpO2:  [100 %] 100 % (01/28 1415) Weight:  [207 lb (93.895 kg)] 207 lb (93.895 kg) (01/28 1415)   There were no vitals filed for this visit.  Physical Exam: General:  Well appearing. No resp difficulty HEENT: normal Neck: supple. JVP to ear. Carotids 2+ bilat; no bruits. No lymphadenopathy or thryomegaly appreciated. Cor: PMI nondisplaced. Tachycardic Regular rate & rhythm. No rubs, S 3+ or murmurs. Lungs: clear Abdomen: soft, nontender, nondistended. No hepatosplenomegaly. No bruits or masses. Good bowel sounds. Extremities: no cyanosis, clubbing, rash, R and LLE  3+ edema that extends to thighs.  Neuro: alert & orientedx3, cranial nerves grossly  intact. moves all 4 extremities w/o difficulty. Affect pleasant   Labs: Basic Metabolic Panel: No results found for this basename: NA:5,K:5,CL:5,CO2:5,GLUCOSE:5,BUN:5,CREATININE:5,CALCIUM:3,MG:5,PHOS:5 in the last 168 hours  Liver Function Tests: No results found for this basename: AST:5,ALT:5,ALKPHOS:5,BILITOT:5,PROT:5,ALBUMIN:5 in the last 168 hours No results found for this basename: LIPASE:5,AMYLASE:5 in the last 168 hours No results found for this basename: AMMONIA:3 in the last 168 hours  CBC: No results found for this basename: WBC:5,NEUTROABS:5,HGB:5,HCT:5,MCV:5,PLT:5 in the last 168 hours  Cardiac Enzymes: No results found for this basename: CKTOTAL:5,CKMB:5,CKMBINDEX:5,TROPONINI:5 in the last 168 hours  BNP: BNP (last 3 results)  Basename 09/21/11 0608 09/02/11 1235 06/20/11 1530  PROBNP 217.0* 284.2* 3661.0*    CBG: No results found for this basename: GLUCAP:5 in the last 168 hours  Coagulation Studies: No results found for this basename: LABPROT:5,INR:5 in the last 72 hours  Other results:   Imaging:  No results found.   Assessment:   1. A/C systolic HF with massive volume overload  2. NICM EF 30-35% (08/2011) 3. Multiple PEs/DVTs s/p IVC filter  --refuses coumadin due to h/o GIB (AVMs)  4. DM2  5. Acute/chronic renal failure (baseline creatinine 1.5 -1.8). 7 Mild PAH    Plan/Discussion:     Mr Fok is being admitted today from the Heart Failure Clinic due to massive volume overload in the setting of dietary noncompliance.   Plan to admit to stepdown and diurese. Start Lasix drip at 10 mg per hour. Place PICC to follow CVPs and CO-OX. If unable to diurese with lasix. If renal function declines or he does not diurese with Lasix alone will need to add Milrinone.Continue hydralazine/IMDUR for afterload reduction. Hold beta blocker at this time. No Ace/ARB due to renal insufficiency.   Repeat ECHO.   He refuses anticoagulants. Will place SCDs.    Check Hemoglobin A1C  Consult Dietitian and Cardiac rehab. Will  also need HH once discharged  Amy Clegg NP-C  Patient seen and examined with [name], NP. We discussed all aspects of the encounter. I agree with the assessment and plan as stated above.   Mr. Quiroa has massive volume overload. Will need to be admitted for IV diuresis with possible inotrope support. Will admit to stepdown. Place PICC to follow CVP and co-ox. Long talk with him about the need to let us know earlier when he is retaining fluid.   Agree with avoiding ACE/ARB.   Truman Hayward 5:56 PM

## 2012-06-15 NOTE — Assessment & Plan Note (Signed)
He Velardi returns for follow up with massive volume overload in the setting of liberalized diet. He has failed to follow up due to transportation. Eating lots of soup and pizza. Weight is at least 25-30 lbs pounds above baseline weight which is closer to 180. Plan to admit to stepdown and diurese.

## 2012-06-15 NOTE — Progress Notes (Signed)
Cardiology notified Peripheral IV access was established to initiate Lasix drip. PICC line to be placed later this evening by IV team per Sarah with IV team.

## 2012-06-15 NOTE — Addendum Note (Signed)
Encounter addended by: Sherald Hess, NP on: 06/15/2012  3:21 PM<BR>     Documentation filed: Visit Diagnoses

## 2012-06-15 NOTE — Progress Notes (Signed)
Pt arrived to 2616 from HF Clinic. CHG bath complete, MRSA Swab Screen obtained and sent to lab. IV team notified of orders to place PICC line.

## 2012-06-16 ENCOUNTER — Inpatient Hospital Stay (HOSPITAL_COMMUNITY): Payer: PRIVATE HEALTH INSURANCE

## 2012-06-16 DIAGNOSIS — I369 Nonrheumatic tricuspid valve disorder, unspecified: Secondary | ICD-10-CM

## 2012-06-16 LAB — GLUCOSE, CAPILLARY
Glucose-Capillary: 167 mg/dL — ABNORMAL HIGH (ref 70–99)
Glucose-Capillary: 131 mg/dL — ABNORMAL HIGH (ref 70–99)
Glucose-Capillary: 147 mg/dL — ABNORMAL HIGH (ref 70–99)

## 2012-06-16 LAB — BASIC METABOLIC PANEL
BUN: 24 mg/dL — ABNORMAL HIGH (ref 6–23)
Chloride: 97 mEq/L (ref 96–112)
GFR calc Af Amer: 47 mL/min — ABNORMAL LOW (ref >90)
Potassium: 3 mEq/L — ABNORMAL LOW (ref 3.5–5.1)

## 2012-06-16 LAB — IRON AND TIBC
Iron: 10 ug/dL — ABNORMAL LOW (ref 42–135)
TIBC: 376 ug/dL (ref 215–435)

## 2012-06-16 LAB — FERRITIN: Ferritin: 17 ng/mL — ABNORMAL LOW (ref 22–322)

## 2012-06-16 MED ORDER — MILRINONE IN DEXTROSE 20 MG/100ML IV SOLN
0.2500 ug/kg/min | INTRAVENOUS | Status: DC
  Administered 2012-06-16 – 2012-06-18 (×4): 0.25 ug/kg/min via INTRAVENOUS
  Filled 2012-06-16 (×4): qty 100

## 2012-06-16 MED ORDER — ALTEPLASE 100 MG IV SOLR
2.0000 mg | Freq: Once | INTRAVENOUS | Status: DC
  Filled 2012-06-16: qty 2

## 2012-06-16 MED ORDER — POTASSIUM CHLORIDE CRYS ER 20 MEQ PO TBCR
40.0000 meq | EXTENDED_RELEASE_TABLET | Freq: Three times a day (TID) | ORAL | Status: DC
  Administered 2012-06-16: 40 meq via ORAL
  Filled 2012-06-16: qty 2

## 2012-06-16 MED ORDER — POTASSIUM CHLORIDE CRYS ER 20 MEQ PO TBCR
40.0000 meq | EXTENDED_RELEASE_TABLET | Freq: Once | ORAL | Status: AC
  Administered 2012-06-16: 40 meq via ORAL
  Filled 2012-06-16: qty 2

## 2012-06-16 MED ORDER — POTASSIUM CHLORIDE 20 MEQ/15ML (10%) PO LIQD
40.0000 meq | Freq: Three times a day (TID) | ORAL | Status: DC
  Administered 2012-06-16 – 2012-06-17 (×5): 40 meq via ORAL
  Filled 2012-06-16 (×9): qty 30

## 2012-06-16 MED ORDER — ENOXAPARIN SODIUM 40 MG/0.4ML ~~LOC~~ SOLN
40.0000 mg | SUBCUTANEOUS | Status: DC
  Administered 2012-06-16 – 2012-06-18 (×3): 40 mg via SUBCUTANEOUS
  Filled 2012-06-16 (×4): qty 0.4

## 2012-06-16 NOTE — Progress Notes (Signed)
Received order. Pt just admitted. HR 139 ST. Will hold ambulation today. Please advise when ambulation is warranted. Thx. Ethelda Chick CES, ACSM

## 2012-06-16 NOTE — H&P (Signed)
Advanced Heart Failure Team History and Physical Note   Primary Physician: Dr Pecola Leisure Primary Cardiologist:  Dr Gala Romney  Reason for Admission: Massive Volume Overload Baseline proBNP: 284 Weight Range: 175-180 pounds  HPI:    [name] is a 67 year old male with chronic systolic CHF secondary to NICM echo EF 30-35% 08/2011 (previously 10-15 05/19/11), HTN, anemia, chronic renal insufficiency-baseline Cr 1.5-1.8, UGI bleed, duodenal AVMs, rectal bleeding 04/2011, DVTs (2004, 2005, 2007, 2010, 2011) he refuses anticoagulation due to GI bleed, IVC filter 2005, GERD, BPD, and chronic low back . 09/2011 S/P ORIF L ankle. Spironolactone switched to eplerenone due to gynecomastia.   Admitted from clinic 1/128 with ~40-50 volume overload. PICC placed last night. Co-ox borderline at 55. On lasix gtt. Weight down 11 pounds but doubt accurate.   Feels like urine output is sluggish. But u/o has been 1400 overnight. CVP checked personally. Remains in sinus tach at 120-130.    Objective:    Vital Signs:   Temp:  [97.6 F (36.4 C)-98 F (36.7 C)] 97.7 F (36.5 C) (01/29 0441) Pulse Rate:  [110-128] 120  (01/29 0500) Resp:  [17-30] 18  (01/29 0500) BP: (91-181)/(63-107) 115/81 mmHg (01/29 0500) SpO2:  [92 %-100 %] 95 % (01/29 0500) Weight:  [93.1 kg (205 lb 4 oz)-98 kg (216 lb 0.8 oz)] 93.1 kg (205 lb 4 oz) (01/29 0454) Last BM Date: 06/14/12 Filed Weights   06/15/12 1601 06/16/12 0454  Weight: 98 kg (216 lb 0.8 oz) 93.1 kg (205 lb 4 oz)    Physical Exam: General:  Well appearing. No resp difficulty HEENT: normal Neck: supple. JVP to ear. Carotids 2+ bilat; no bruits. No lymphadenopathy or thryomegaly appreciated. Cor: PMI nondisplaced. Tachycardic Regular rate & rhythm. No rubs, S 3+ or murmurs. Lungs: clear Abdomen: soft, nontender, nondistended. No hepatosplenomegaly. No bruits or masses. Good bowel sounds. Extremities: no cyanosis, clubbing, rash, R and LLE  3+ edema that extends to  thighs.  Neuro: alert & orientedx3, cranial nerves grossly intact. moves all 4 extremities w/o difficulty. Affect pleasant   Labs: Basic Metabolic Panel:  Lab 06/15/12 4098  NA 134*  K 3.0*  CL 98  CO2 26  GLUCOSE 199*  BUN 25*  CREATININE 1.68*  CALCIUM 8.6  MG 1.4*  PHOS --    Liver Function Tests:  Lab 06/15/12 1630  AST 28  ALT 25  ALKPHOS 126*  BILITOT 3.7*  PROT 7.0  ALBUMIN 2.9*   No results found for this basename: LIPASE:5,AMYLASE:5 in the last 168 hours No results found for this basename: AMMONIA:3 in the last 168 hours  CBC:  Lab 06/15/12 1630  WBC 3.1*  NEUTROABS 1.7  HGB 9.7*  HCT 33.6*  MCV 68.3*  PLT 180    Cardiac Enzymes: No results found for this basename: CKTOTAL:5,CKMB:5,CKMBINDEX:5,TROPONINI:5 in the last 168 hours  BNP: BNP (last 3 results)  Basename 06/15/12 1630 09/21/11 0608 09/02/11 1235  PROBNP 2744.0* 217.0* 284.2*    CBG:  Lab 06/15/12 2152 06/15/12 1631  GLUCAP 198* 107*    Coagulation Studies: No results found for this basename: LABPROT:5,INR:5 in the last 72 hours  Other results:   Imaging: No results found.   Assessment:   1. A/C systolic HF with massive volume overload  2. NICM EF 30-35% (08/2011) 3. Multiple PEs/DVTs s/p IVC filter  --refuses coumadin due to h/o GIB (AVMs)  4. DM2  5. Acute/chronic renal failure (baseline creatinine 1.5 -1.8). 6. Mild PAH  7. Microcytic anemia  Plan/Discussion:     Continues with volume overload. Responding mildly to IV lasix. Co-ox marginal. Will add milrinone and increase lasix to 15. Place condom cath. Follow renal function and electrolytes.  Ultrafiltration may also be an option if needed.   Await results of echo.   Agree with avoiding ACE/ARB. No b-blocker yet.  Has microcytic anemia with severely depressed MCV. Will need to review and see if he has had a hemoglobin electropheresis. Check iron stores and FOBT (h/o GIB). Likely needs IV iron this admit.    Daniel Bensimhon,MD 5:20 AM

## 2012-06-16 NOTE — Progress Notes (Signed)
Echocardiogram 2D Echocardiogram has been performed.  [name] A 06/16/2012, 8:59 AM

## 2012-06-16 NOTE — Progress Notes (Signed)
MD aware of Pt HR sustaining 130's at times with any sort of exertion. Will continue to monitor.  M.Foster Simpson, RN

## 2012-06-16 NOTE — Plan of Care (Signed)
Problem: Food- and Nutrition-Related Knowledge Deficit (NB-1.1) Goal: Nutrition education Formal process to instruct or train a patient/client in a skill or to impart knowledge to help patients/clients voluntarily manage or modify food choices and eating behavior to maintain or improve health. Outcome: Completed/Met Date Met:  06/16/12 Pt with hx of CAD, CHF, and HTN. Spoke with pt about a low-sodium diet using the teach back method and providing him with a handout. Pt currently on a Heart Healthy diet. Weight trending down since admission, most likely due to diuresing as pt states he is admitted about every 7-8 due to fluid retention and volume over load. BMI 27.9, Overweight.  Pt states he has received prior education on a low-sodium diet and shows understanding of what foods should be avoided. Continues to eat canned soups and pizza. Expect fair compliance based on compliance hx.  No further nutrition intervention warranted at this time. Please consult RD if nutrition intervention is needed.   Trenton Gammon Dietetic Intern # [phone]

## 2012-06-16 NOTE — Progress Notes (Signed)
[name], NP at bedside and she was informed that patient would not complete total am dose of potassium. She spoke to patient and then he agreed to take the remaining 40 mEq of potassium. Medication given.

## 2012-06-16 NOTE — Progress Notes (Signed)
CALLED THIS AM  TO ASSESS RIGHT ARM PICC FOR POSSIBLE  CLOTTED WHITE PORT, WAS UNABLE TO FLUSH OR OBTAIN BLOOD RETURN FROM THE PORT,  PER XRAY ON 06/16/12 THERE CAN BE SEEN A KINK IN THE PICC LINE  MID WAY OF UPPER ARM, PICC WAS RETRACTED 1 CM, UPON RETRACTION BLOOD RETURN WAS OBTAINED AND PORT FLUSHED WITHOUT DIFFICULTY, PORT NOW AT THIS TIME IS CLOTTED AND UNABLE TO USE, SUGGEST PICC EXCHANGE IN AM TO CORRECT PROBLEM, STAFF RN TO INFORM MD, PT DOES HAVE LEFT PIV THAT BE USED FOR IV MEDS.

## 2012-06-16 NOTE — Progress Notes (Signed)
Pt scheduled to take Potassium replacement ( ). Only took 2 pills (40 mEq), refused to take the remaining two pills. He asked about the frequency of this scheduled medication. Explained that it was ordered TID. He stated "Amy knows better." Explained the importance of taking Potassium replacement due to K of 3.0 and effect of Lasix infusion. Also educated patient on Cardiac effects associated with hypokalemia. Pt continues to refuse additional 40 mEq of potassium.

## 2012-06-17 DIAGNOSIS — N179 Acute kidney failure, unspecified: Secondary | ICD-10-CM

## 2012-06-17 DIAGNOSIS — D509 Iron deficiency anemia, unspecified: Secondary | ICD-10-CM

## 2012-06-17 LAB — CARBOXYHEMOGLOBIN
Carboxyhemoglobin: 2 % — ABNORMAL HIGH (ref 0.5–1.5)
Methemoglobin: 1.3 % (ref 0.0–1.5)
O2 Saturation: 56.3 %
Total hemoglobin: 9.7 g/dL — ABNORMAL LOW (ref 13.5–18.0)

## 2012-06-17 LAB — BASIC METABOLIC PANEL
CO2: 32 mEq/L (ref 19–32)
Chloride: 101 mEq/L (ref 96–112)
Glucose, Bld: 188 mg/dL — ABNORMAL HIGH (ref 70–99)
Sodium: 141 mEq/L (ref 135–145)

## 2012-06-17 LAB — GLUCOSE, CAPILLARY
Glucose-Capillary: 148 mg/dL — ABNORMAL HIGH (ref 70–99)
Glucose-Capillary: 261 mg/dL — ABNORMAL HIGH (ref 70–99)
Glucose-Capillary: 113 mg/dL — ABNORMAL HIGH (ref 70–99)
Glucose-Capillary: 159 mg/dL — ABNORMAL HIGH (ref 70–99)

## 2012-06-17 MED ORDER — METOPROLOL SUCCINATE ER 50 MG PO TB24
50.0000 mg | ORAL_TABLET | Freq: Every day | ORAL | Status: DC
  Administered 2012-06-17 – 2012-06-19 (×3): 50 mg via ORAL
  Filled 2012-06-17 (×3): qty 1

## 2012-06-17 MED ORDER — IRON DEXTRAN 50 MG/ML IJ SOLN
25.0000 mg | Freq: Once | INTRAMUSCULAR | Status: AC
  Administered 2012-06-17: 25 mg via INTRAVENOUS
  Filled 2012-06-17: qty 0.5

## 2012-06-17 MED ORDER — MAGNESIUM SULFATE 40 MG/ML IJ SOLN
2.0000 g | Freq: Once | INTRAMUSCULAR | Status: AC
  Administered 2012-06-17: 2 g via INTRAVENOUS
  Filled 2012-06-17: qty 50

## 2012-06-17 MED ORDER — SODIUM CHLORIDE 0.9 % IV SOLN
500.0000 mg | Freq: Every day | INTRAVENOUS | Status: AC
  Administered 2012-06-17 – 2012-06-18 (×2): 500 mg via INTRAVENOUS
  Filled 2012-06-17 (×3): qty 10

## 2012-06-17 MED ORDER — ALUM & MAG HYDROXIDE-SIMETH 200-200-20 MG/5ML PO SUSP
30.0000 mL | ORAL | Status: DC | PRN
  Administered 2012-06-17 – 2012-06-18 (×2): 30 mL via ORAL
  Filled 2012-06-17 (×2): qty 30

## 2012-06-17 MED ORDER — EPLERENONE 25 MG PO TABS
12.5000 mg | ORAL_TABLET | Freq: Every day | ORAL | Status: DC
  Administered 2012-06-17: 12.5 mg via ORAL
  Filled 2012-06-17 (×2): qty 1

## 2012-06-17 NOTE — Progress Notes (Signed)
Advanced Heart Failure Rounding Note   Subjective:    Primary Physician: Dr Pecola Leisure  Primary Cardiologist: Dr Gala Romney  Reason for Admission: Massive Volume Overload  Baseline proBNP: 284  Weight Range: 175-180 pounds  HPI:   [name] is a 67 year old male with chronic systolic CHF secondary to NICM echo EF 30-35% 08/2011 (previously 10-15 05/19/11), HTN, anemia, chronic renal insufficiency-baseline Cr 1.5-1.8, UGI bleed, duodenal AVMs, rectal bleeding 04/2011, DVTs (2004, 2005, 2007, 2010, 2011) he refuses anticoagulation due to GI bleed, IVC filter 2005, GERD, BPD, and chronic low back . 09/2011 S/P ORIF L ankle. Spironolactone switched to eplerenone due to gynecomastia.   Admitted from clinic 1/128 with ~40-50 volume overload. Admit weight 216 pounds. PICC placed last night. Co-ox borderline at 55. Yesterday Milrinone was added due to sluggish diuresis. 24 hour I/O 4.7 liters. Overall weight down 31 pounds  CO-OX 56  Magnesium 1.4  Hemoglobin  Iron 10 Ferritin 17  CVP personally checked 7-8  Denies SOB/PND/Orthopnea.     Objective:   Weight Range:  Vital Signs:   Temp:  [97.5 F (36.4 C)-97.9 F (36.6 C)] 97.9 F (36.6 C) (01/30 0340) Pulse Rate:  [115-133] 121  (01/30 0600) Resp:  [13-27] 14  (01/30 0600) BP: (102-152)/(58-101) 102/74 mmHg (01/30 0600) SpO2:  [91 %-100 %] 94 % (01/30 0600) Weight:  [185 lb 1.6 oz (83.961 kg)] 185 lb 1.6 oz (83.961 kg) (01/30 0500) Last BM Date: 06/16/12  Weight change: Filed Weights   06/15/12 1601 06/16/12 0454 06/17/12 0500  Weight: 216 lb 0.8 oz (98 kg) 205 lb 4 oz (93.1 kg) 185 lb 1.6 oz (83.961 kg)    Intake/Output:   Intake/Output Summary (Last 24 hours) at 06/17/12 0748 Last data filed at 06/17/12 0700  Gross per 24 hour  Intake   1606 ml  Output   6300 ml  Net  -4694 ml     Physical Exam: CVP 7-8 General: Well appearing. No resp difficulty  HEENT: normal  Neck: supple. JVP 7-8. Carotids 2+ bilat; no bruits. No  lymphadenopathy or thryomegaly appreciated.  Cor: PMI nondisplaced. Tachycardic Regular rate & rhythm. No rubs, S 3+ or murmurs.  Lungs: clear  Abdomen: soft, nontender, nondistended. No hepatosplenomegaly. No bruits or masses. Good bowel sounds.  Extremities: no cyanosis, clubbing, rash, R and LLE trace edema.  Neuro: alert & orientedx3, cranial nerves grossly intact. moves all 4 extremities w/o difficulty. Affect pleasant    Telemetry: Sinus Tach 120s  Labs: Basic Metabolic Panel:  Lab 06/17/12 4782 06/16/12 0450 06/15/12 1630  NA 141 137 134*  K 3.6 3.0* 3.0*  CL 101 97 98  CO2 32 28 26  GLUCOSE 188* 170* 199*  BUN 18 24* 25*  CREATININE 1.67* 1.72* 1.68*  CALCIUM 8.5 8.8 8.6  MG -- -- 1.4*  PHOS -- -- --    Liver Function Tests:  Lab 06/15/12 1630  AST 28  ALT 25  ALKPHOS 126*  BILITOT 3.7*  PROT 7.0  ALBUMIN 2.9*   No results found for this basename: LIPASE:5,AMYLASE:5 in the last 168 hours No results found for this basename: AMMONIA:3 in the last 168 hours  CBC:  Lab 06/15/12 1630  WBC 3.1*  NEUTROABS 1.7  HGB 9.7*  HCT 33.6*  MCV 68.3*  PLT 180    Cardiac Enzymes: No results found for this basename: CKTOTAL:5,CKMB:5,CKMBINDEX:5,TROPONINI:5 in the last 168 hours  BNP: BNP (last 3 results)  Basename 06/15/12 1630 09/21/11 0608 09/02/11 1235  PROBNP  2744.0* 217.0* 284.2*     Other results:  EKG:   Imaging: Dg Chest Port 1 View  06/16/2012  *RADIOLOGY REPORT*  Clinical Data: Cough, shortness of breath, dyspnea  PORTABLE CHEST - 1 VIEW  Comparison: 09/02/2011; 06/21/2011; 07/05/2010  Findings: Grossly unchanged enlarged cardiac silhouette.  Normal mediastinal contours.  Interval placement of right upper extremity approach PICC line with tip projected over the mid SVC.  Improved aeration of the lungs with persistent bibasilar opacities, left greater than right, likely atelectasis.  No new focal airspace opacities.  No pleural effusion or  pneumothorax.  Unchanged bones.  IMPRESSION: 1.  No acute cardiopulmonary disease.  2.  Right upper extremity approach PICC line tip projects over the mid SVC. 2.  Improved aeration the lung bases with persistent bibasilar atelectasis.   Original Report Authenticated By: Tacey Ruiz, MD      Medications:     Scheduled Medications:    . alteplase  2 mg Intracatheter Once  . atorvastatin  20 mg Oral q1800  . digoxin  0.125 mg Oral Daily  . enoxaparin (LOVENOX) injection  40 mg Subcutaneous Q24H  . glipiZIDE  5 mg Oral Q breakfast  . hydrALAZINE  25 mg Oral Q8H  . insulin aspart  0-20 Units Subcutaneous TID WC  . insulin glargine  20 Units Subcutaneous QHS  . isosorbide mononitrate  30 mg Oral Daily  . potassium chloride  40 mEq Oral TID  . sodium chloride  10-40 mL Intracatheter Q12H  . sodium chloride  3 mL Intravenous Q12H  . Tamsulosin HCl  0.4 mg Oral QPC supper    Infusions:    . furosemide (LASIX) infusion 15 mg/hr (06/17/12 0604)  . milrinone 0.25 mcg/kg/min (06/16/12 2121)    PRN Medications: sodium chloride, acetaminophen, ondansetron (ZOFRAN) IV, sodium chloride, sodium chloride   Assessment:  1. A/C systolic HF with massive volume overload  2. NICM EF 30-35% (08/2011)  3. Multiple PEs/DVTs s/p IVC filter  --refuses coumadin due to h/o GIB (AVMs)  4. DM2  5. Acute/chronic renal failure (baseline creatinine 1.5 -1.8).  6. Mild PAH  7. Microcytic anemia 8. Hypokalemia- 9. Hypomagnesium    Plan/Discussion:    Brisk diuresis with Milrinone and IV lasix. Volume status much improved CVP down to 7-8.  Overall weight down 31 pounds. Continue Lasix drip one more day and anticipate switching oral diuretics tomorrow (Torsemide 20 mg bid).  Restart beta blocker today.  No Ace/ARB due to renal function. Start INSPRA 12.5 mg daily - he is intoerant Spironolactone due to gynecomastia. Renal function ok.   Iron level low. Give Iron Dextran 500 mg x 2 doses  Replace  Magnesium. Give 2 grams today. Re-check magnesium in am.    Start cardiac rehab today  Appreciate Dietitian input.   Consult care management - HH once discharged   Length of Stay: 2   CLEGG,AMY 06/17/2012, 7:48 AM   Patient seen and examined with Timothy Becket, NP. We discussed all aspects of the encounter. I agree with the assessment and plan as stated above.   Volume status markedly improved on milrinone and IV lasix. Getting close to baseline. Would continue both. Drop lasix down to 7.5/hr. Remains quite tachycardic despite adequate co-ox. Will start home Toprol back. Cardiac to see for ambulation. Supp mag and iron. Check hemoglobin electropheresis. Awaiting FOB. Will likely need outpatient GI f/u for AVMs.  Janean Eischen,MD 9:45 AM

## 2012-06-17 NOTE — Care Management Note (Addendum)
    Page 1 of 1   06/21/2012     9:54:16 AM   CARE MANAGEMENT NOTE 06/21/2012  Patient:  [name], [name]   Account Number:  [id number]  Date Initiated:  06/17/2012  Documentation initiated by:  Derris Millan  Subjective/Objective Assessment:   67 yr-old male adm with dx of systolic HF; lives alone @ [address], has rolling walker, h/o home health services through [facility] and [facility]     DC Planning Services  CM consult      [facility] arranged  HH - 11 Patient Refused      Status of service:  Completed, signed off  Discharge Disposition:  HOME/SELF CARE  Per UR Regulation:  Reviewed for med. necessity/level of care/duration of stay  Comments:  06/18/11 1410 Chuck Caban RN BSN MSN CCM Discussed home health services with pt who states he has had nurses come in several times in the past, felt services were not helpful - nurses visited a few times and then did not return.  Explained to pt that his insurance dictated how many visits were allowed.  Per pt, he now is currently receiving 58 hrs MCD personal care services weekly and his aide will transport him to medical appts as needed - also states he travels by bus to mall or [employer] and walks for exercise.  States he is aware he has been eating too much soup with high sodium content and plans to start buying soups with lower sodium content based on NP recommendation. Also refers to written information provided by nutritionist and states he now knows not to order pizza.   Refuses referral for home health RN for chf education/mgmt.

## 2012-06-17 NOTE — Progress Notes (Signed)
CARDIAC REHAB PHASE I   PRE:  Rate/Rhythm: 128 ST    BP: sitting 114/82    SaO2: 98 RA  MODE:  Ambulation: 680 ft   POST:  Rate/Rhythm: 138 ST    BP: sitting 116/74     SaO2: 96 RA  Tolerated well with RW. Fairly steady. Denied SOB however HR slowly up to 138 ST. Denied sx.Return to bed (pt sts he doesn't want to get stuck in chair when no one will help him back to bed). Had BM before walk. Pt happy to walk. Will f/u. [id number]  Harriet Masson CES, ACSM

## 2012-06-17 NOTE — Progress Notes (Signed)
Inpatient Diabetes Program Recommendations  AACE/ADA: New Consensus Statement on Inpatient Glycemic Control (2013)  Target Ranges:  Prepandial:   less than 140 mg/dL      Peak postprandial:   less than 180 mg/dL (1-2 hours)      Critically ill patients:  140 - 180 mg/dL  Results for JEFFRY, VOGELSANG (MRN [id number]) as of 06/17/2012 13:04  Ref. Range 06/16/2012 12:29 06/16/2012 17:12 06/16/2012 22:04 06/17/2012 07:57 06/17/2012 11:44  Glucose-Capillary Latest Range: 70-99 mg/dL 409 (H) 811 (H) 914 (H) 148 (H) 261 (H)   Inpatient Diabetes Program Recommendations Insulin - Basal: Decrease Lantus to 10 units  Insulin - Meal Coverage: add Novolog 4 units TID with meals  Patient is refusing Lantus and has not had a dose.   Will follow. Thank you  Piedad Climes BSN, RN,CDE Inpatient Diabetes Coordinator [phone] (team pager)

## 2012-06-18 DIAGNOSIS — R000 Tachycardia, unspecified: Secondary | ICD-10-CM

## 2012-06-18 LAB — BASIC METABOLIC PANEL
BUN: 15 mg/dL (ref 6–23)
Creatinine, Ser: 1.59 mg/dL — ABNORMAL HIGH (ref 0.50–1.35)
GFR calc Af Amer: 51 mL/min — ABNORMAL LOW (ref >90)
GFR calc non Af Amer: 44 mL/min — ABNORMAL LOW (ref >90)
Glucose, Bld: 113 mg/dL — ABNORMAL HIGH (ref 70–99)

## 2012-06-18 LAB — GLUCOSE, CAPILLARY
Glucose-Capillary: 101 mg/dL — ABNORMAL HIGH (ref 70–99)
Glucose-Capillary: 197 mg/dL — ABNORMAL HIGH (ref 70–99)
Glucose-Capillary: 125 mg/dL — ABNORMAL HIGH (ref 70–99)
Glucose-Capillary: 125 mg/dL — ABNORMAL HIGH (ref 70–99)

## 2012-06-18 LAB — MAGNESIUM: Magnesium: 1.3 mg/dL — ABNORMAL LOW (ref 1.5–2.5)

## 2012-06-18 LAB — CARBOXYHEMOGLOBIN: Carboxyhemoglobin: 1.9 % — ABNORMAL HIGH (ref 0.5–1.5)

## 2012-06-18 LAB — TSH: TSH: 1.785 u[IU]/mL (ref 0.350–4.500)

## 2012-06-18 IMAGING — CR DG CHEST 2V
2 series · 2 of 2 positions shown · non-contrast
Comparison: 06/23/2011

CLINICAL DATA: Chest pain

CHEST - 2 VIEW

[w chest pa]
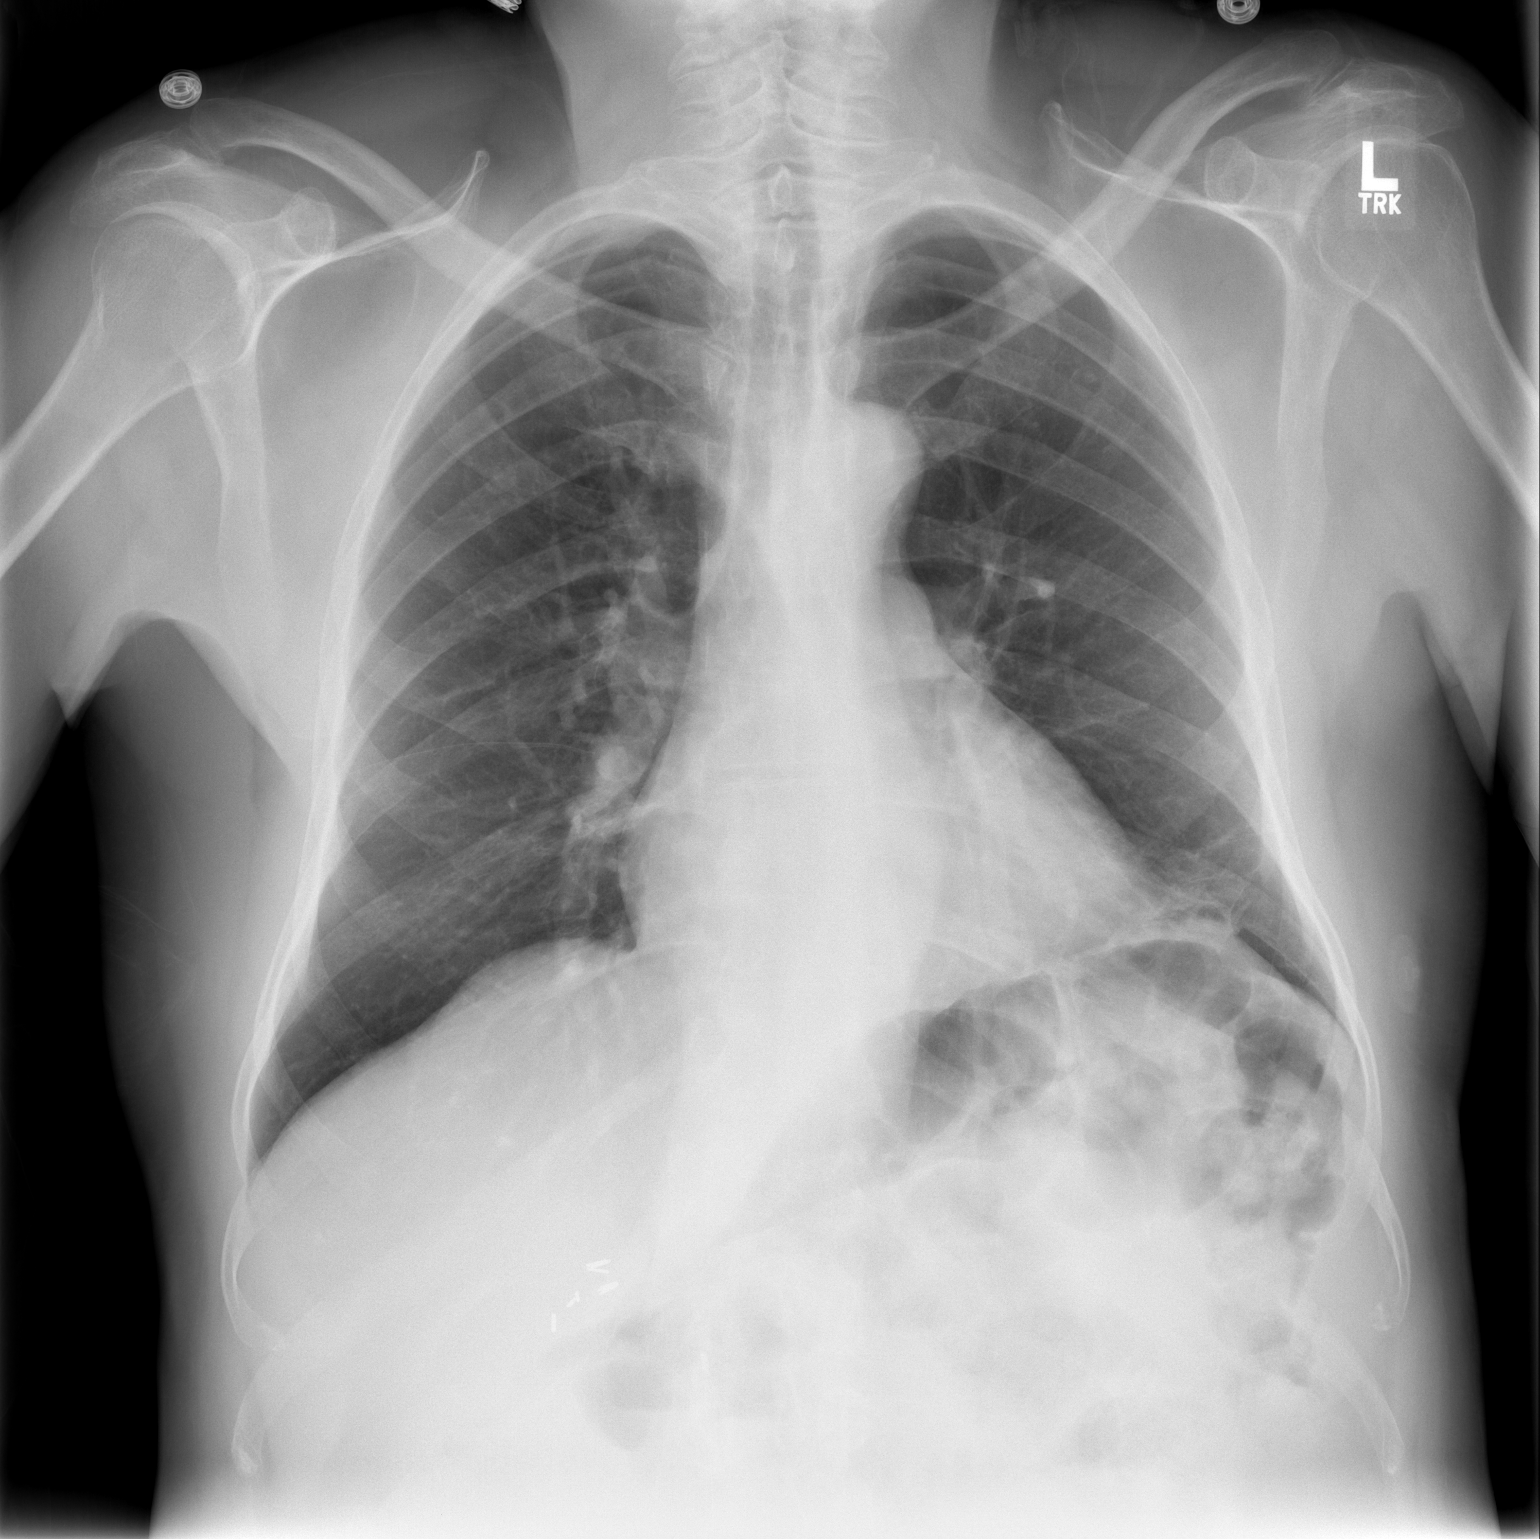

[w chest lat]
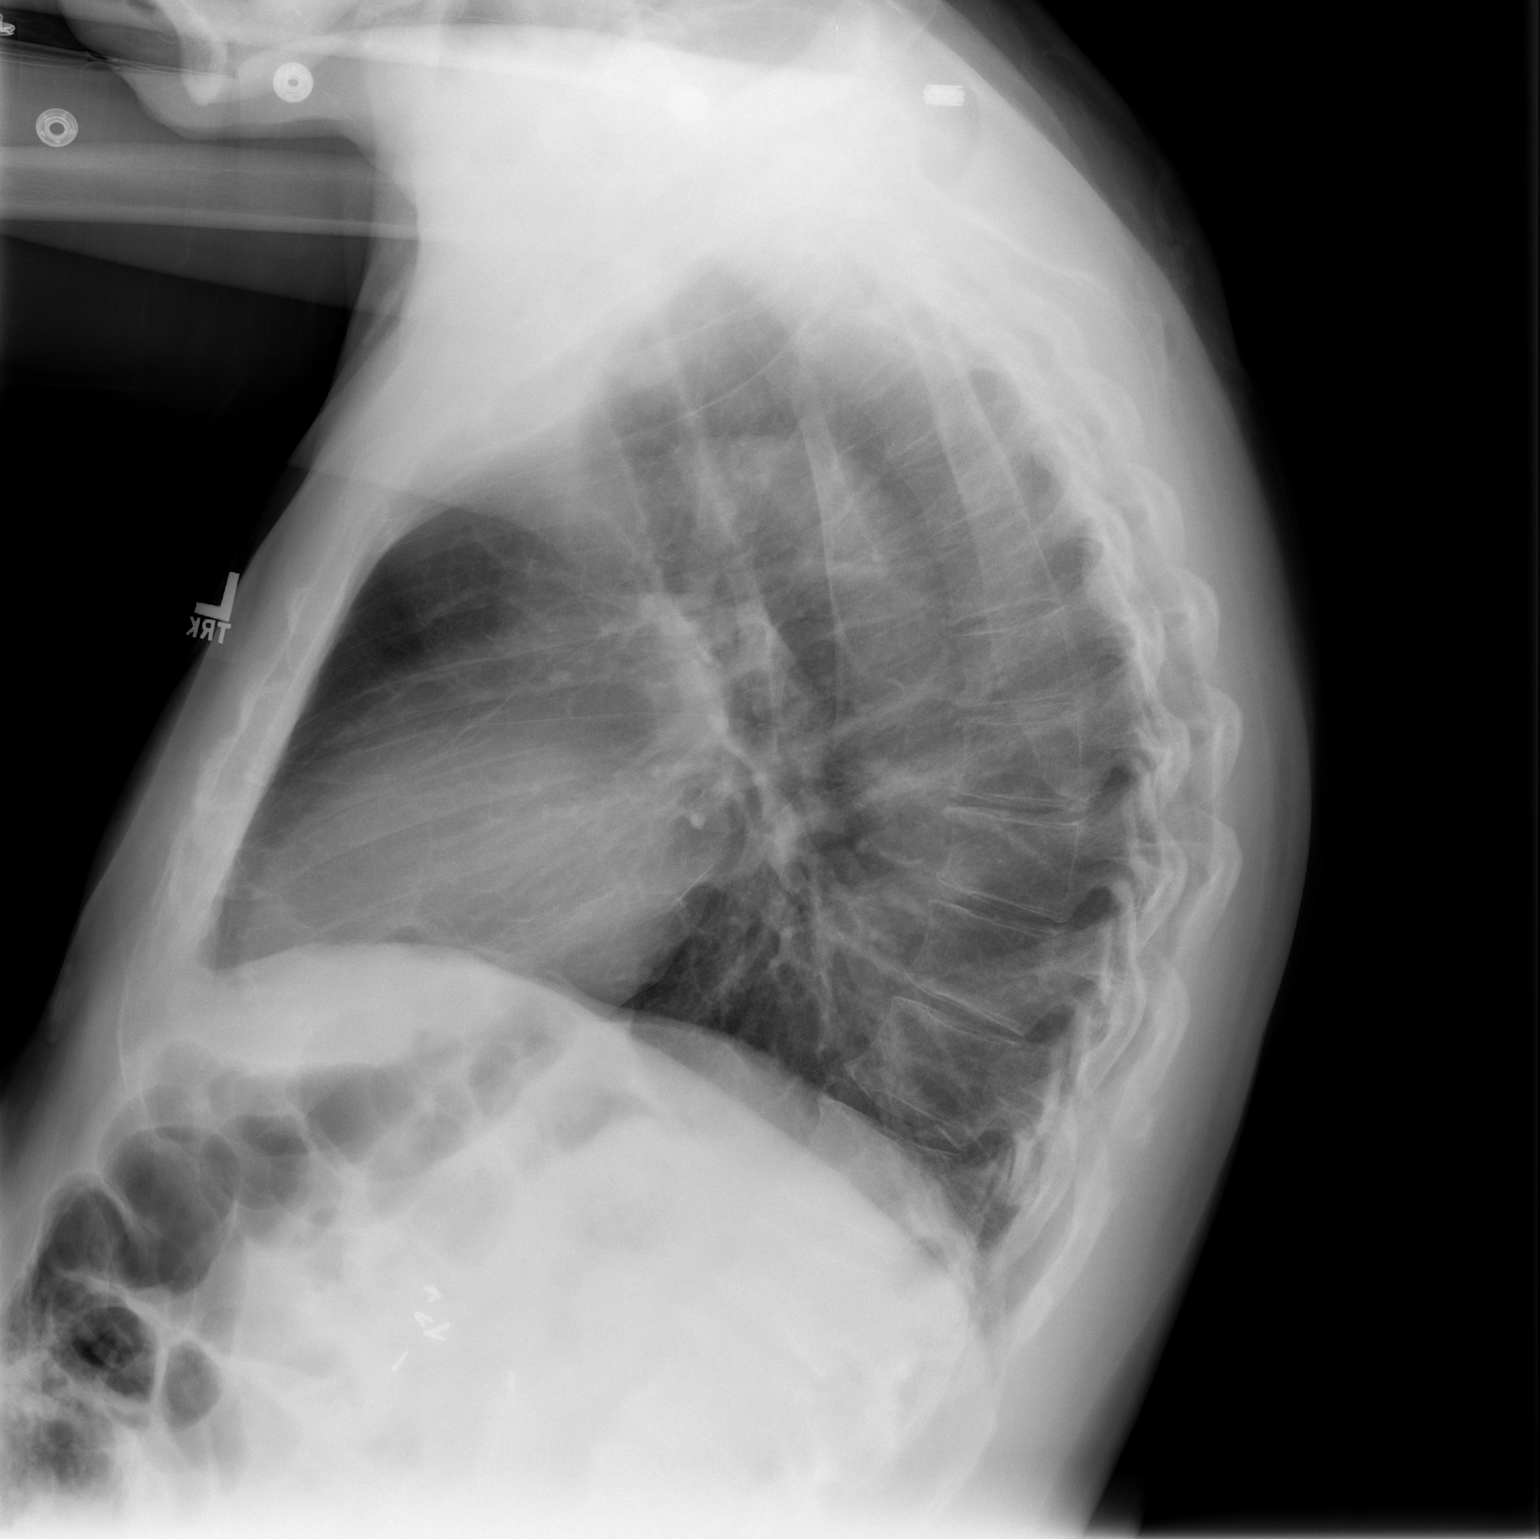

[2 of 2 positions shown; findings below may reference images not displayed]

FINDINGS: Cardiomediastinal silhouette is stable.  No acute
infiltrate or pleural effusion.  No pulmonary edema.  There is left
basilar atelectasis or scarring. No pulmonary edema.  Mild
degenerative changes thoracic spine.
IMPRESSION: No active disease.  Left basilar atelectasis or scarring.

## 2012-06-18 MED ORDER — MAGNESIUM SULFATE 40 MG/ML IJ SOLN
4.0000 g | Freq: Once | INTRAMUSCULAR | Status: AC
  Administered 2012-06-18: 4 g via INTRAVENOUS
  Filled 2012-06-18: qty 100

## 2012-06-18 MED ORDER — PANTOPRAZOLE SODIUM 40 MG PO TBEC
40.0000 mg | DELAYED_RELEASE_TABLET | Freq: Every day | ORAL | Status: DC
  Administered 2012-06-18 – 2012-06-19 (×2): 40 mg via ORAL
  Filled 2012-06-18 (×2): qty 1

## 2012-06-18 MED ORDER — EPLERENONE 25 MG PO TABS
25.0000 mg | ORAL_TABLET | Freq: Every day | ORAL | Status: DC
  Administered 2012-06-18 – 2012-06-19 (×2): 25 mg via ORAL
  Filled 2012-06-18 (×2): qty 1

## 2012-06-18 MED ORDER — POTASSIUM CHLORIDE 20 MEQ/15ML (10%) PO LIQD: 40.0000 meq | Freq: Every day | ORAL | Status: DC

## 2012-06-18 NOTE — Discharge Summary (Signed)
Advanced Heart Failure Team  Discharge Summary   Patient ID: [name] MRN: [id number], DOB/AGE: 1945-08-03 67 y.o. Admit date: 06/15/2012 D/C date:     06/18/2012   Primary Discharge Diagnoses:  1. A/C systolic HF with massive volume overload  2. NICM EF 30-35% (08/2011)  3. Multiple PEs/DVTs s/p IVC filter  --refuses coumadin due to h/o GIB (AVMs)  4. DM2  5. Acute/chronic renal failure (baseline creatinine 1.5 -1.8).  6. Mild PAH  7. Microcytic anemia  8. Hypokalemia- resolved 9. Hypomagnesium   Hospital Course:  [name] is a 67 year old male with chronic systolic CHF secondary to NICM echo EF 30-35% 08/2011 (previously 10-15 05/19/11), HTN, anemia, chronic renal insufficiency-baseline Cr 1.5-1.8, UGI bleed, duodenal AVMs, rectal bleeding 04/2011, DVTs (2004, 2005, 2007, 2010, 2011) he refuses anticoagulation due to GI bleed, IVC filter 2005, GERD, BPD, and chronic low back . 09/2011 S/P ORIF L ankle. Spironolactone switched to eplerenone due to gynecomastia.   He was admitted 06/15/12 from HF clinic with massive volume overload in the setting of dietary noncompliance. PICC placed for CVP monitoring and CO-OX. Initial CVP was 28 and CO-OX was 55. He was diuresed with Lasix drip but due to sluggish urine output Milrinone was added. He had brisk response to this.  Weight down to 175. Milrinone and lasix gtt weaned. Unfortunately co-ox dropped to 43% of inotropes. I suggested we initiate home milrinone prior to d/c but he wanted to go home to pay bills and have Hickman placed (PICC was clotted) as outpatient and start home milrinone. He was aware that he may deteriorate with this approach but still wanted to go  To avoid possible eviction if he didn't pay his rent on time. I feel this is acceptable as long as he is aware of risk. We will see him back on 2/4.    We will change diuretics to Torsemide 20 mg bid. D/c weight 177  Admission Magnesium was 1.4 which was replaced. Iron Dextran  500 mg x 2 given for low Iron.  He was not placed on anticoagulants because Mr Lough refused due to his history of  GI bleeds.   He received consultation from the Dietitian and Cardiac Rehab for HF education. He was able to ambulate 680 feet without dyspnea. He was offered home health however he refused initially. Will need this for home inotropes. Lengthy discussions occurred regarding low salt food choices, medication compliance, and daily weights.   He will continue to be followed closely in the HF clinic with his next appointment June 22, 2012 at 2:00.   Discharge Weight Range:  Discharge Weight Discharge Vitals: Blood pressure 101/86, pulse 120, temperature 97.8 F (36.6 C), temperature source Oral, resp. rate 19, height 6' (1.829 m), weight 175 lb 14.4 oz (79.788 kg), SpO2 97.00%.  Labs: Lab Results  Component Value Date   WBC 3.1* 06/15/2012   HGB 9.7* 06/15/2012   HCT 33.6* 06/15/2012   MCV 68.3* 06/15/2012   PLT 180 06/15/2012    Lab 06/18/12 0510 06/15/12 1630  NA 136 --  K 3.8 --  CL 99 --  CO2 31 --  BUN 15 --  CREATININE 1.59* --  CALCIUM 9.2 --  PROT -- 7.0  BILITOT -- 3.7*  ALKPHOS -- 126*  ALT -- 25  AST -- 28  GLUCOSE 113* --   Lab Results  Component Value Date   CHOL 143 07/23/2010   HDL 34.80* 07/23/2010   LDLCALC 86 07/23/2010  TRIG 109.0 07/23/2010   BNP (last 3 results)  Basename 06/15/12 1630 09/21/11 0608 09/02/11 1235  PROBNP 2744.0* 217.0* 284.2*    Diagnostic Studies/Procedures   No results found.  Discharge Medications     Medication List     As of 06/18/2012  1:07 PM    ASK your doctor about these medications         atorvastatin 20 MG tablet   Commonly known as: LIPITOR   Take 20 mg by mouth every morning.      digoxin 0.125 MG tablet   Commonly known as: LANOXIN   Take 1 tablet (0.125 mg total) by mouth daily.      eplerenone 25 MG tablet   Commonly known as: INSPRA   Take 1 tablet (25 mg total) by mouth daily.       esomeprazole 40 MG capsule   Commonly known as: NEXIUM   Take 40 mg by mouth daily before breakfast.      fluticasone 0.005 % ointment   Commonly known as: CUTIVATE   Apply 1 application topically 2 (two) times daily. For affected areas      glipiZIDE 5 MG 24 hr tablet   Commonly known as: GLUCOTROL XL   Take 5 mg by mouth daily.      hydrALAZINE 25 MG tablet   Commonly known as: APRESOLINE   Take 1 tablet (25 mg total) by mouth 3 (three) times daily.      insulin glargine 100 UNIT/ML injection   Commonly known as: LANTUS   Inject 20 Units into the skin at bedtime.      isosorbide mononitrate 30 MG 24 hr tablet   Commonly known as: IMDUR   Take 30 mg by mouth daily.      meclizine 25 MG tablet   Commonly known as: ANTIVERT   Take 25 mg by mouth 3 (three) times daily as needed. For vertigo      metoprolol succinate 50 MG 24 hr tablet   Commonly known as: TOPROL-XL   Take 50 mg by mouth daily. Take with or immediately following a meal.      naproxen sodium 220 MG tablet   Commonly known as: ANAPROX   Take 440 mg by mouth 3 (three) times daily with meals.      Tamsulosin HCl 0.4 MG Caps   Commonly known as: FLOMAX   Take 1 capsule (0.4 mg total) by mouth at bedtime.      torsemide 20 MG tablet   Commonly known as: DEMADEX   Take 1 tablet (20 mg total) by mouth daily.      traMADol 50 MG tablet   Commonly known as: ULTRAM   Take 50 mg by mouth every 6 (six) hours as needed. For pain        Disposition   The patient will be discharged in stable condition to home.     Discharge Orders    Future Appointments: Provider: Department: Dept Phone: Center:   06/22/2012 2:00 PM [facility] [phone] None     Follow-up Information    Follow up with Arvilla Meres, MD. On 06/22/2012. (At 2:00 Garage Code 0900)    Contact information:   [address] [place] [address] [phone]             Duration of Discharge Encounter: Greater than 35 minutes  Arvilla Meres, MD

## 2012-06-18 NOTE — Progress Notes (Signed)
Advanced Heart Failure Rounding Note   Subjective:    Primary Physician: Dr Pecola Leisure  Primary Cardiologist: Dr Gala Romney  Reason for Admission: Massive Volume Overload  Baseline proBNP: 284  Weight Range: 175-180 pounds  HPI:   Mr. Deshmukh is a 67 year old male with chronic systolic CHF secondary to NICM echo EF 30-35% 08/2011 (previously 10-15 05/19/11), HTN, anemia, chronic renal insufficiency-baseline Cr 1.5-1.8, UGI bleed, duodenal AVMs, rectal bleeding 04/2011, DVTs (2004, 2005, 2007, 2010, 2011) he refuses anticoagulation due to GI bleed, IVC filter 2005, GERD, BPD, and chronic low back . 09/2011 S/P ORIF L ankle. Spironolactone switched to eplerenone due to gynecomastia.   Admitted from clinic 1/128 with ~40-50 volume overload. Admit weight 216 pounds. PICC placed last night. Co-ox borderline at 55. 06/16/12 Milrinone was added due to sluggish diuresis. Yesterday lasix drip cut back to 5 mg per hour.  24 hour I/O 5.8 liters. Overall weight down 41 pounds. CVP personally checked 4  CO-OX 56>50  Magnesium 1.4>1.3. Repleting again.   Feels great. Denies SOB/PND/Orthopnea.     Objective:   Weight Range:  Vital Signs:   Temp:  [97.4 F (36.3 C)-97.9 F (36.6 C)] 97.9 F (36.6 C) (01/31 0338) Pulse Rate:  [110-132] 121  (01/31 0416) Resp:  [12-26] 17  (01/31 0416) BP: (99-128)/(59-106) 122/84 mmHg (01/31 0536) SpO2:  [93 %-98 %] 93 % (01/31 0416) Weight:  [175 lb 14.4 oz (79.788 kg)] 175 lb 14.4 oz (79.788 kg) (01/31 0500) Last BM Date: 06/17/12  Weight change: Filed Weights   06/16/12 0454 06/17/12 0500 06/18/12 0500  Weight: 205 lb 4 oz (93.1 kg) 185 lb 1.6 oz (83.961 kg) 175 lb 14.4 oz (79.788 kg)    Intake/Output:   Intake/Output Summary (Last 24 hours) at 06/18/12 0802 Last data filed at 06/18/12 0700  Gross per 24 hour  Intake   1787 ml  Output   7675 ml  Net  -5888 ml     Physical Exam: CVP 4 General: Well appearing. No resp difficulty  HEENT: normal  Neck:  supple. JVP 4 Carotids 2+ bilat; no bruits. No lymphadenopathy or thryomegaly appreciated.  Cor: PMI nondisplaced. Tachycardic Regular rate & rhythm. No rubs, S 3+ or murmurs.  Lungs: clear  Abdomen: soft, nontender, nondistended. No hepatosplenomegaly. No bruits or masses. Good bowel sounds.  Extremities: no cyanosis, clubbing, rash, no edema.  Neuro: alert & orientedx3, cranial nerves grossly intact. moves all 4 extremities w/o difficulty. Affect pleasant    Telemetry: Sinus Tach 120s  Labs: Basic Metabolic Panel:  Lab 06/18/12 1610 06/17/12 0612 06/16/12 0450 06/15/12 1630  NA 136 141 137 134*  K 3.8 3.6 3.0* 3.0*  CL 99 101 97 98  CO2 31 32 28 26  GLUCOSE 113* 188* 170* 199*  BUN 15 18 24* 25*  CREATININE 1.59* 1.67* 1.72* 1.68*  CALCIUM 9.2 8.5 8.8 --  MG 1.3* -- -- 1.4*  PHOS -- -- -- --    Liver Function Tests:  Lab 06/15/12 1630  AST 28  ALT 25  ALKPHOS 126*  BILITOT 3.7*  PROT 7.0  ALBUMIN 2.9*   No results found for this basename: LIPASE:5,AMYLASE:5 in the last 168 hours No results found for this basename: AMMONIA:3 in the last 168 hours  CBC:  Lab 06/15/12 1630  WBC 3.1*  NEUTROABS 1.7  HGB 9.7*  HCT 33.6*  MCV 68.3*  PLT 180    Cardiac Enzymes: No results found for this basename: CKTOTAL:5,CKMB:5,CKMBINDEX:5,TROPONINI:5 in the last 168  hours  BNP: BNP (last 3 results)  Basename 06/15/12 1630 09/21/11 0608 09/02/11 1235  PROBNP 2744.0* 217.0* 284.2*     Other results:  EKG:   Imaging: No results found.   Medications:     Scheduled Medications:    . alteplase  2 mg Intracatheter Once  . atorvastatin  20 mg Oral q1800  . digoxin  0.125 mg Oral Daily  . enoxaparin (LOVENOX) injection  40 mg Subcutaneous Q24H  . eplerenone  12.5 mg Oral Daily  . glipiZIDE  5 mg Oral Q breakfast  . hydrALAZINE  25 mg Oral Q8H  . insulin aspart  0-20 Units Subcutaneous TID WC  . insulin glargine  20 Units Subcutaneous QHS  . iron dextran  (INFED/DEXFERRUM) infusion  500 mg Intravenous Daily  . isosorbide mononitrate  30 mg Oral Daily  . metoprolol succinate  50 mg Oral Daily  . potassium chloride  40 mEq Oral TID  . sodium chloride  10-40 mL Intracatheter Q12H  . sodium chloride  3 mL Intravenous Q12H  . Tamsulosin HCl  0.4 mg Oral QPC supper    Infusions:    . furosemide (LASIX) infusion 8 mg/hr (06/18/12 0536)  . milrinone 0.25 mcg/kg/min (06/18/12 0416)    PRN Medications: sodium chloride, acetaminophen, alum & mag hydroxide-simeth, ondansetron (ZOFRAN) IV, sodium chloride, sodium chloride   Assessment:  1. A/C systolic HF with massive volume overload  2. NICM EF 30-35% (08/2011)  3. Multiple PEs/DVTs s/p IVC filter  --refuses coumadin due to h/o GIB (AVMs)  4. DM2  5. Acute/chronic renal failure (baseline creatinine 1.5 -1.8).  6. Mild PAH  7. Microcytic anemia 8. Hypokalemia- 9. Hypomagnesium    Plan/Discussion:    Brisk diuresis with Milrinone and IV lasix. Volume status much improved. Anticipate switching to oral diuretics today. Iron level low. Give Iron Dextran 500 mg x 2 doses  Replace Magnesium. Give 4 grams today. Re-check magnesium in am.   Continue cardiac rehab  Appreciate Dietitian input.   Consult care management - HH once discharged   Length of Stay: 3   CLEGG,AMY 06/18/2012, 8:02 AM   Patient seen and examined with [name], NP. We discussed all aspects of the encounter. I agree with the assessment and plan as stated above.   Volume status markedly improved on milrinone and IV lasix. Now at baseline. Will stop milrinone and switch back to home dose of lasix. Supp mag and iron today. Remains quite tachycardic - home Toprol restarted yesterday but still around 120. ECG shows sinus tach. Check TSH. Continue cardiac rehab.  Likely home tomorrow with [facility]. HF followup next week  Awaiting FOB. Will likely need outpatient GI f/u for AVMs.  Dispo: Likely home tomorrow.  Preliminary d/c summary in incomplete notes.  Atzin Buchta,MD 8:41 AM

## 2012-06-18 NOTE — Progress Notes (Signed)
CARDIAC REHAB PHASE I   PRE:  Rate/Rhythm: 115 ST    BP: sitting 122/81    SaO2: 94 RA  MODE:  Ambulation: 680 ft   POST:  Rate/Rhythm: 130 ST    BP: sitting 117/74     SaO2: 97 RA  Tolerated well. No c/o using RW, which he has at home. Return to bed. Anxious to go home tomorrow. Reviewed HF with pt. Knows right answers for everything.  [id number]  [name] CES, ACSM

## 2012-06-19 LAB — BASIC METABOLIC PANEL
BUN: 12 mg/dL (ref 6–23)
CO2: 26 mEq/L (ref 19–32)
Chloride: 101 mEq/L (ref 96–112)
Creatinine, Ser: 1.51 mg/dL — ABNORMAL HIGH (ref 0.50–1.35)
GFR calc Af Amer: 55 mL/min — ABNORMAL LOW (ref >90)
Potassium: 4 mEq/L (ref 3.5–5.1)

## 2012-06-19 LAB — GLUCOSE, CAPILLARY: Glucose-Capillary: 106 mg/dL — ABNORMAL HIGH (ref 70–99)

## 2012-06-19 LAB — PRO B NATRIURETIC PEPTIDE: Pro B Natriuretic peptide (BNP): 555.9 pg/mL — ABNORMAL HIGH (ref 0–125)

## 2012-06-19 LAB — CARBOXYHEMOGLOBIN
Carboxyhemoglobin: 1.5 % (ref 0.5–1.5)
Methemoglobin: 1.6 % — ABNORMAL HIGH (ref 0.0–1.5)

## 2012-06-19 MED ORDER — PANTOPRAZOLE SODIUM 40 MG PO TBEC: 40.0000 mg | DELAYED_RELEASE_TABLET | Freq: Every day | ORAL | Status: DC

## 2012-06-19 MED ORDER — TORSEMIDE 20 MG PO TABS: 20.0000 mg | ORAL_TABLET | Freq: Two times a day (BID) | ORAL | Status: DC

## 2012-06-19 NOTE — Progress Notes (Signed)
Pt called rn to the room very agitated stating that the doctor said he is going home as soon as his picc is out, as at now his picc is out. Pt was told by rn that since we do not have an official order for him to go home he still has to be monitored, he said  'no' the doctor has already explained to him his heart condition and that he is going to pull the monitoring device off him if we don't. RN explained to the pt the need to be monitored till he is d/c officially, but he said he needs his tele off and o2 device off, so they were taken off. MD stated earlier that pt is going to be d/c home and when asked to put in an order in epi, he said the PA will work on the d/c summary. -----Lezette Kitts, rn

## 2012-06-19 NOTE — Progress Notes (Signed)
Pt was d/c in a stable and was stable wheeled down-------Shalva Rozycki, rn

## 2012-06-19 NOTE — Progress Notes (Signed)
Advanced Heart Failure Rounding Note   Subjective:    Primary Physician: Dr Pecola Leisure  Primary Cardiologist: Dr Gala Romney  Reason for Admission: Massive Volume Overload  Baseline proBNP: 284  Weight Range: 175-180 pounds  HPI:   [name] is a 67 year old male with chronic systolic CHF secondary to NICM echo EF 30-35% 08/2011 (previously 10-15 05/19/11), HTN, anemia, chronic renal insufficiency-baseline Cr 1.5-1.8, UGI bleed, duodenal AVMs, rectal bleeding 04/2011, DVTs (2004, 2005, 2007, 2010, 2011) he refuses anticoagulation due to GI bleed, IVC filter 2005, GERD, BPD, and chronic low back . 09/2011 S/P ORIF L ankle. Spironolactone switched to eplerenone due to gynecomastia.   Admitted from clinic 1/128 with ~40-50 volume overload. Admit weight 216 pounds. PICC placed last night. Co-ox borderline at 55. 06/16/12 Milrinone was added due to sluggish diuresis. Had great response. Overall weight down 41 pounds.  Milrinone and lasix gtt weaned off. Weight now going back up. Co-ox dropping.  CO-OX 56>50>43  Feels fine. Denies orthopnea/PND. Adamant he needs to go home to pay bills by Monday. Wants to start home milrinone as an outpatient. PICC clotted. CVP climbing back up.    Objective:   Weight Range:  Vital Signs:   Temp:  [97.7 F (36.5 C)-98.4 F (36.9 C)] 97.7 F (36.5 C) (02/01 0810) Pulse Rate:  [56-124] 114  (02/01 0810) Resp:  [13-23] 16  (02/01 0810) BP: (90-118)/(61-86) 95/65 mmHg (02/01 0810) SpO2:  [93 %-99 %] 95 % (02/01 0810) Weight:  [80.6 kg (177 lb 11.1 oz)] 80.6 kg (177 lb 11.1 oz) (02/01 0544) Last BM Date: 06/17/12  Weight change: Filed Weights   06/17/12 0500 06/18/12 0500 06/19/12 0544  Weight: 83.961 kg (185 lb 1.6 oz) 79.788 kg (175 lb 14.4 oz) 80.6 kg (177 lb 11.1 oz)    Intake/Output:   Intake/Output Summary (Last 24 hours) at 06/19/12 0918 Last data filed at 06/19/12 0811  Gross per 24 hour  Intake   1200 ml  Output   1950 ml  Net   -750 ml      Physical Exam: CVP 10-12 General: Well appearing. No resp difficulty  HEENT: normal  Neck: supple. JVP up Carotids 2+ bilat; no bruits. No lymphadenopathy or thryomegaly appreciated.  Cor: PMI nondisplaced. Tachycardic Regular rate & rhythm. No rubs, S 3+ or murmurs.  Lungs: clear  Abdomen: soft, nontender, nondistended. No hepatosplenomegaly. No bruits or masses. Good bowel sounds.  Extremities: no cyanosis, clubbing, rash, no edema.  Neuro: alert & orientedx3, cranial nerves grossly intact. moves all 4 extremities w/o difficulty. Affect pleasant    Telemetry: Sinus Tach 115-120s  Labs: Basic Metabolic Panel:  Lab 06/19/12 4782 06/18/12 0510 06/17/12 0612 06/16/12 0450 06/15/12 1630  NA 134* 136 141 137 134*  K 4.0 3.8 3.6 3.0* 3.0*  CL 101 99 101 97 98  CO2 26 31 32 28 26  GLUCOSE 111* 113* 188* 170* 199*  BUN 12 15 18  24* 25*  CREATININE 1.51* 1.59* 1.67* 1.72* 1.68*  CALCIUM 8.9 9.2 8.5 -- --  MG -- 1.3* -- -- 1.4*  PHOS -- -- -- -- --    Liver Function Tests:  Lab 06/15/12 1630  AST 28  ALT 25  ALKPHOS 126*  BILITOT 3.7*  PROT 7.0  ALBUMIN 2.9*   No results found for this basename: LIPASE:5,AMYLASE:5 in the last 168 hours No results found for this basename: AMMONIA:3 in the last 168 hours  CBC:  Lab 06/15/12 1630  WBC 3.1*  NEUTROABS 1.7  HGB 9.7*  HCT 33.6*  MCV 68.3*  PLT 180    Cardiac Enzymes: No results found for this basename: CKTOTAL:5,CKMB:5,CKMBINDEX:5,TROPONINI:5 in the last 168 hours  BNP: BNP (last 3 results)  Basename 06/19/12 0500 06/15/12 1630 09/21/11 0608  PROBNP 555.9* 2744.0* 217.0*     Other results:  EKG: Sinus tach  Imaging: No results found.   Medications:     Scheduled Medications:    . alteplase  2 mg Intracatheter Once  . atorvastatin  20 mg Oral q1800  . digoxin  0.125 mg Oral Daily  . enoxaparin (LOVENOX) injection  40 mg Subcutaneous Q24H  . eplerenone  25 mg Oral Daily  . glipiZIDE  5 mg Oral  Q breakfast  . hydrALAZINE  25 mg Oral Q8H  . insulin aspart  0-20 Units Subcutaneous TID WC  . insulin glargine  20 Units Subcutaneous QHS  . isosorbide mononitrate  30 mg Oral Daily  . metoprolol succinate  50 mg Oral Daily  . pantoprazole  40 mg Oral Daily  . sodium chloride  10-40 mL Intracatheter Q12H  . sodium chloride  3 mL Intravenous Q12H  . Tamsulosin HCl  0.4 mg Oral QPC supper    Infusions:    PRN Medications: sodium chloride, acetaminophen, alum & mag hydroxide-simeth, ondansetron (ZOFRAN) IV, sodium chloride, sodium chloride   Assessment:  1. A/C systolic HF with massive volume overload  2. NICM EF 30-35% (08/2011)  3. Multiple PEs/DVTs s/p IVC filter  --refuses coumadin due to h/o GIB (AVMs)  4. DM2  5. Acute/chronic renal failure (baseline creatinine 1.5 -1.8).  6. Mild PAH  7. Microcytic anemia 8. Hypokalemia- 9. Hypomagnesium 10. Cardiogenic shock   Plan/Discussion:    Volume status markedly improved on milrinone and IV lasix. Now at baseline.   Co-ox falling again and I suspect he will be inotrope dependent. I suggested we do this in house but he would like to go home to pay bills and have Hickman placed as outpatient and start home milrinone. He is aware that he may deteriorate with this approach but still wants to go. I feel this is acceptable as long as he is aware of risk. We will see him back on 2/4.   Awaiting FOB. Will likely need outpatient GI f/u for AVMs.  Timothy Auletta,MD 9:18 AM

## 2012-06-22 ENCOUNTER — Ambulatory Visit (HOSPITAL_COMMUNITY)
Admit: 2012-06-22 | Discharge: 2012-06-22 | Disposition: A | Discharge status: Home / Self Care | Payer: PRIVATE HEALTH INSURANCE | Attending: Internal Medicine | Admitting: Internal Medicine

## 2012-06-22 VITALS — BP 92/62 | HR 110 | Wt 182.5 lb

## 2012-06-22 DIAGNOSIS — I5022 Chronic systolic (congestive) heart failure: Secondary | ICD-10-CM | POA: Insufficient documentation

## 2012-06-22 NOTE — Assessment & Plan Note (Signed)
He returns for post hospital follow up and feels great. NYHA II. Functionally he has improved and able to perform all ADLS without difficulty. Volume status stable. Continue current diuretic regimen. Lengthy  discussion regarding home inotropes which may make him feel better but will not prolog his life. At the present time he would like to hold off on inotropes because he says he is feeling great.  Reinforced low salt food choices, daily weights, and medication compliance. Follow up in 2 weeks to reassess volume status. Consider low dose beta blocker if volume status is stable.

## 2012-06-22 NOTE — Progress Notes (Signed)
Patient ID: [name], male   DOB: 03/13/1946, 67 y.o.   MRN: [id number] Orthopedic Surgeon: Dr Dion Saucier PCP: Dr Pecola Leisure  HPI:  [name] is a 67 year old male with chronic systolic CHF secondary to NICM echo EF 30-35%, HTN, anemia, chronic renal insufficiency Cr 1.31,  UGI bleed,  duodenal AVMs,  rectal bleeding 04/2011,  DVTs (2004, 2005, 2007, 2010, 2011) however coumadin on hold due to GI bleed, IVC filter 2005, GERD, BPD, and chronic low back .  09/2011 S/P ORIF L ankle. Spironolactone switched to eplerenone due to gynecomastia.  Discharged 06/25/11  Weight 159 pounds.  RHC 2/13 RA = 4  RV = 32/6 (7)  PA = 45/15 (24)  PCW = 7  Fick cardiac output/index = 5.2/2.7  Thermo = 4.0/2.1  PVR = 3.4 Woods  FA sat = 94%  PA sat = 56%, 57%  Noninvasive BP : 125/92 (72)  Calculated SVR = 1040   08/13/11 CPX  RER 1.16 Peak VO2 18.6 ml/kg/min Predicted 61.4% VE/VCO2 slope 42.8 08/23/2011 EF 30-35%  Admitted from clinic 1/128 with ~40-50 volume overload. Diuresed with Milrinone and Lasix. Diuresed 41 pounds. Milrinone and lasix gtt weaned off. CO-OX dropped 56>50>43. Beta blocker was stopped due to low output. Discharge weight 175 pounds.   He returns for hospital follow up. Feels great. Denies SOB/PND/[facility]thopea. Able to perform ADLs independently. Weight at home 175 pounds.  He has an aide 58 hours per week. Compliant medications. Drinking less < 2 liters per day. Tries to follow low salt diet. He has given all his soup away!!!      ROS: All systems negative except as listed in HPI, PMH and Problem List.  Past Medical History  Diagnosis Date  . Venous thromboembolism     DVTs in 2004, 2005, 2007, 8/10, 8/11. IVC filter 2005. Off coumadin 04/2011 secondary to rectal bleeding  . 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 Chronic venous insufficiency   . DM2 (diabetes mellitus, type 2)   . GERD (gastroesophageal reflux disease)   . BPH (benign prostatic hypertrophy)   .  Low back pain   . HTN (hypertension)   . HLD (hyperlipidemia)   . NICM (nonischemic cardiomyopathy) 1. 9/11  2. 2/12    1. Echo septal and apical akinesis, dilated LV, EF 45%, RV normal size and systolic function, EF 43% on Myoview  2. Admitted with decompensated CHF, echo EF 30-35% with diffuse hypokinesis but akinesis of mid to apical anteroseptal wall and apex, moderate LVH, mild MR, grade I diastolic dysfunction, SPEP/UP negative and HIV negative. Most recent EF 10-15% 04/2011  . Smoking   . CAD (coronary artery disease) 1. 2007  2. 10/11    1. Left heart cath with 40-50% stenosis in small RCA EF 50%  2. Lexiscan myoview EF 43%, global hypokinesis, possible small area of apical ischemia; LHC no angiographic CAD, no LV-gram done due to CKD   2. No angiographic CAD by cath 02/2010  . CKD (chronic kidney disease) stage 2, GFR 60-89 ml/min as of 02/2011  . Headache   . Diabetes mellitus   . Blood transfusion   . Anemia   . Syncope     1. in setting of presumed orthostatic Hypotn 09/2011  . Right fibular fracture     1. 09/2011 - resulting from fall/syncope  . Closed right ankle fracture 10/10/2011    Current Outpatient Prescriptions  Medication Sig Dispense Refill  .  atorvastatin (LIPITOR) 20 MG tablet Take 20 mg by mouth every morning.       . digoxin (LANOXIN) 0.125 MG tablet Take 1 tablet (0.125 mg total) by mouth daily.  90 tablet  3  . eplerenone (INSPRA) 25 MG tablet Take 1 tablet (25 mg total) by mouth daily.  90 tablet  3  . fluticasone (CUTIVATE) 0.005 % ointment Apply 1 application topically 2 (two) times daily. For affected areas      . glipiZIDE (GLUCOTROL XL) 5 MG 24 hr tablet Take 5 mg by mouth daily.       . hydrALAZINE (APRESOLINE) 25 MG tablet Take 1 tablet (25 mg total) by mouth 3 (three) times daily.  90 tablet  6  . insulin glargine (LANTUS) 100 UNIT/ML injection Inject 20 Units into the skin at bedtime.       . isosorbide mononitrate (IMDUR) 30 MG 24 hr tablet Take 30  mg by mouth daily.       . meclizine (ANTIVERT) 25 MG tablet Take 25 mg by mouth 3 (three) times daily as needed. For vertigo      . metoprolol succinate (TOPROL-XL) 50 MG 24 hr tablet Take 50 mg by mouth daily. Take with or immediately following a meal.      . naproxen sodium (ANAPROX) 220 MG tablet Take 440 mg by mouth 3 (three) times daily with meals.      . pantoprazole (PROTONIX) 40 MG tablet Take 1 tablet (40 mg total) by mouth daily.  30 tablet  4  . Tamsulosin HCl (FLOMAX) 0.4 MG CAPS Take 1 capsule (0.4 mg total) by mouth at bedtime.  90 capsule  1  . torsemide (DEMADEX) 20 MG tablet Take 1 tablet (20 mg total) by mouth 2 (two) times daily.      . traMADol (ULTRAM) 50 MG tablet Take 50 mg by mouth every 6 (six) hours as needed. For pain         PHYSICAL EXAM: Filed Vitals:   06/22/12 1358  BP: 92/62  Pulse: 110  Weight: 182 lb 8 oz (82.781 kg)  SpO2: 99%    General: Comfortable. No acute distress. Brother and Cousin present HEENT: normal except for poor dentition Neck: supple. JVP to 7-8 Carotids 2+ bilaterally; no bruits. No lymphadenopathy or thryomegaly appreciated. Cor: PMI displaced laterally. regular rate and rhythm.  Lungs: clear Abdomen: NT. Good bowel sounds. Mildly distended Extremities: no cyanosis, clubbing, rash, no edema.  RLE/ LLE trace    Neuro: alert & orientedx3, cranial nerves grossly intact. Moves all 4 extremities w/o difficulty. Affect pleasant.    ASSESSMENT & PLAN:

## 2012-06-22 NOTE — Patient Instructions (Addendum)
Follow up in 2 weeks with Dr Bensimhon   Do the following things EVERYDAY: 1) Weigh yourself in the morning before breakfast. Write it down and keep it in a log. 2) Take your medicines as prescribed 3) Eat low salt foods-Limit salt (sodium) to 2000 mg per day.  4) Stay as active as you can everyday 5) Limit all fluids for the day to less than 2 liters 

## 2012-07-08 ENCOUNTER — Ambulatory Visit (HOSPITAL_COMMUNITY)
Admission: RE | Admit: 2012-07-08 | Discharge: 2012-07-08 | Disposition: A | Discharge status: Home / Self Care | Payer: PRIVATE HEALTH INSURANCE | Source: Ambulatory Visit | Attending: Internal Medicine | Admitting: Internal Medicine

## 2012-07-08 ENCOUNTER — Encounter (HOSPITAL_COMMUNITY): Payer: Self-pay

## 2012-07-08 VITALS — HR 120 | Wt 196.8 lb

## 2012-07-08 DIAGNOSIS — I5022 Chronic systolic (congestive) heart failure: Secondary | ICD-10-CM | POA: Insufficient documentation

## 2012-07-08 LAB — BASIC METABOLIC PANEL
BUN: 32 mg/dL — ABNORMAL HIGH (ref 6–23)
CO2: 18 mEq/L — ABNORMAL LOW (ref 19–32)
Calcium: 9.1 mg/dL (ref 8.4–10.5)
Chloride: 101 mEq/L (ref 96–112)
Creatinine, Ser: 1.98 mg/dL — ABNORMAL HIGH (ref 0.50–1.35)

## 2012-07-08 MED ORDER — METOLAZONE 2.5 MG PO TABS: 2.5000 mg | ORAL_TABLET | ORAL | Status: DC | PRN

## 2012-07-08 NOTE — Assessment & Plan Note (Addendum)
He returns for follow with volume overload. Weight up 14 pounds. Give Metolazone 2.5 mg today and tomorrow. Continue Torsemide 20 mg twice a day. Check BMET. Dr Gala Romney discussed advanced therapies. He will return for follow up next week and if weight remains elevated will admit and perform RHC. Marland Kitchen At that time will decide if he will start home inotropes.   Patient seen and examined with [name], NP. We discussed all aspects of the encounter. I agree with the assessment and plan as stated above. Mr. Schmutz continues to struggle with advanced HF. We discussed the possibility of re-admission for RHC and possible initiation of home intoropes. He would prefer to defer hospitalization at this point. We will add metolazone. If not improving, will plan outpatient  RHC and potential initiation of milrinone next week. He knows to call 911 if he is getting worse.

## 2012-07-08 NOTE — Progress Notes (Signed)
Patient ID: [name], male   DOB: [birth year], 67 y.o.   MRN: [id number] Orthopedic Surgeon: Dr Dion Saucier PCP: Dr Pecola Leisure  HPI:  [name] is a 67 year old male with chronic systolic CHF secondary to NICM echo EF 30-35%, HTN, anemia, chronic renal insufficiency Cr 1.31,  UGI bleed,  duodenal AVMs,  rectal bleeding 04/2011,  DVTs (2004, 2005, 2007, 2010, 2011) however coumadin on hold due to GI bleed, IVC filter 2005, GERD, BPD, and chronic low back .  09/2011 S/P ORIF L ankle. Spironolactone switched to eplerenone due to gynecomastia.  Discharged 06/25/11  Weight 159 pounds.  RHC 2/13 RA = 4  RV = 32/6 (7)  PA = 45/15 (24)  PCW = 7  Fick cardiac output/index = 5.2/2.7  Thermo = 4.0/2.1  PVR = 3.4 Woods  FA sat = 94%  PA sat = 56%, 57%  Noninvasive BP : 125/92 (72)  Calculated SVR = 1040   08/13/11 CPX  RER 1.16 Peak VO2 18.6 ml/kg/min Predicted 61.4% VE/VCO2 slope 42.8 08/23/2011 EF 30-35%  Admitted from clinic 05/30/2012 with ~40-50 volume overload. Diuresed with Milrinone and Lasix. Diuresed 41 pounds. Milrinone and lasix gtt weaned off. CO-OX dropped 56>50>43. Beta blocker was stopped due to low output. He was offered home Milrinone however he declined.  Discharge weight 175 pounds.   He returns for follow up. Last office visit he again declined home Milrinone. He says he can still perform all ADLs without difficult but he knows his weight is up. Denies SOB/PND/Orthopea/dizziness. Able to perform ADLs independently. Weight at home trending up from 175-182 pounds. He has an aide 58 hours per week. Compliant medications. Drinking less < 2 liters per day. Tries to follow low salt diet.      ROS: All systems negative except as listed in HPI, PMH and Problem List.  Past Medical History  Diagnosis Date  . Venous thromboembolism     DVTs in 2004, 2005, 2007, 8/10, 8/11. IVC filter 2005. Off coumadin 04/2011 secondary to rectal bleeding  . AVM (arteriovenous malformation)     duodenal -- w/  GI bleed  . Chronic venous insufficiency   . DM2 (diabetes mellitus, type 2)   . GERD (gastroesophageal reflux disease)   . BPH (benign prostatic hypertrophy)   . Low back pain   . HTN (hypertension)   . HLD (hyperlipidemia)   . NICM (nonischemic cardiomyopathy) 1. 9/11  2. 2/12    1. Echo septal and apical akinesis, dilated LV, EF 45%, RV normal size and systolic function, EF 43% on Myoview  2. Admitted with decompensated CHF, echo EF 30-35% with diffuse hypokinesis but akinesis of mid to apical anteroseptal wall and apex, moderate LVH, mild MR, grade I diastolic dysfunction, SPEP/UP negative and HIV negative. Most recent EF 10-15% 04/2011  . Smoking   . CAD (coronary artery disease) 1. 2007  2. 10/11    1. Left heart cath with 40-50% stenosis in small RCA EF 50%  2. Lexiscan myoview EF 43%, global hypokinesis, possible small area of apical ischemia; LHC no angiographic CAD, no LV-gram done due to CKD   2. No angiographic CAD by cath 02/2010  . CKD (chronic kidney disease) stage 2, GFR 60-89 ml/min as of 02/2011  . Headache   . Diabetes mellitus   . Blood transfusion   . Anemia   . Syncope     1. in setting of presumed orthostatic Hypotn 09/2011  . Right fibular fracture  1. 09/2011 - resulting from fall/syncope  . Closed right ankle fracture 10/10/2011    Current Outpatient Prescriptions  Medication Sig Dispense Refill  . atorvastatin (LIPITOR) 20 MG tablet Take 20 mg by mouth every morning.       . digoxin (LANOXIN) 0.125 MG tablet Take 1 tablet (0.125 mg total) by mouth daily.  90 tablet  3  . eplerenone (INSPRA) 25 MG tablet Take 1 tablet (25 mg total) by mouth daily.  90 tablet  3  . fluticasone (CUTIVATE) 0.005 % ointment Apply 1 application topically 2 (two) times daily. For affected areas      . glipiZIDE (GLUCOTROL XL) 5 MG 24 hr tablet Take 5 mg by mouth daily.       . hydrALAZINE (APRESOLINE) 25 MG tablet Take 1 tablet (25 mg total) by mouth 3 (three) times daily.  90  tablet  6  . insulin glargine (LANTUS) 100 UNIT/ML injection Inject 20 Units into the skin at bedtime.       . isosorbide mononitrate (IMDUR) 30 MG 24 hr tablet Take 30 mg by mouth daily.       . meclizine (ANTIVERT) 25 MG tablet Take 25 mg by mouth 3 (three) times daily as needed. For vertigo      . metoprolol succinate (TOPROL-XL) 50 MG 24 hr tablet Take 50 mg by mouth daily. Take with or immediately following a meal.      . naproxen sodium (ANAPROX) 220 MG tablet Take 440 mg by mouth 3 (three) times daily with meals.      . pantoprazole (PROTONIX) 40 MG tablet Take 1 tablet (40 mg total) by mouth daily.  30 tablet  4  . Tamsulosin HCl (FLOMAX) 0.4 MG CAPS Take 1 capsule (0.4 mg total) by mouth at bedtime.  90 capsule  1  . torsemide (DEMADEX) 20 MG tablet Take 1 tablet (20 mg total) by mouth 2 (two) times daily.      . traMADol (ULTRAM) 50 MG tablet Take 50 mg by mouth every 6 (six) hours as needed. For pain       No current facility-administered medications for this encounter.     PHYSICAL EXAM: Filed Vitals:   07/08/12 0913  Pulse: 120  Weight: 196 lb 12 oz (89.245 kg)  SpO2: 98%    General: Comfortable. No acute distress. Brother and Cousin present HEENT: normal except for poor dentition Neck: supple. JVP to jaw Carotids 2+ bilaterally; no bruits. No lymphadenopathy or thryomegaly appreciated. Cor: PMI displaced laterally. regular rate and rhythm.  Lungs: clear Abdomen: NT. Good bowel sounds. Mildly distended Extremities: no cyanosis, clubbing, rash, no edema.  RLE/ LLE trace    Neuro: alert & orientedx3, cranial nerves grossly intact. Moves all 4 extremities w/o difficulty. Affect pleasant.    ASSESSMENT & PLAN:

## 2012-07-08 NOTE — Patient Instructions (Addendum)
Take Metolazone 2.5 mg today and tomorrow  Follow up next week  Do the following things EVERYDAY: 1) Weigh yourself in the morning before breakfast. Write it down and keep it in a log. 2) Take your medicines as prescribed 3) Eat low salt foods-Limit salt (sodium) to 2000 mg per day.  4) Stay as active as you can everyday 5) Limit all fluids for the day to less than 2 liters 

## 2012-07-09 ENCOUNTER — Emergency Department (HOSPITAL_COMMUNITY): Payer: PRIVATE HEALTH INSURANCE

## 2012-07-09 ENCOUNTER — Encounter (HOSPITAL_COMMUNITY): Payer: Self-pay | Admitting: *Deleted

## 2012-07-09 ENCOUNTER — Emergency Department (HOSPITAL_COMMUNITY)
Admission: EM | Admit: 2012-07-09 | Discharge: 2012-07-09 | Disposition: A | Discharge status: Home / Self Care | Payer: PRIVATE HEALTH INSURANCE | Attending: Emergency Medicine | Admitting: Emergency Medicine

## 2012-07-09 DIAGNOSIS — I509 Heart failure, unspecified: Secondary | ICD-10-CM

## 2012-07-09 DIAGNOSIS — Z8774 Personal history of (corrected) congenital malformations of heart and circulatory system: Secondary | ICD-10-CM | POA: Insufficient documentation

## 2012-07-09 DIAGNOSIS — N400 Enlarged prostate without lower urinary tract symptoms: Secondary | ICD-10-CM | POA: Insufficient documentation

## 2012-07-09 DIAGNOSIS — K219 Gastro-esophageal reflux disease without esophagitis: Secondary | ICD-10-CM | POA: Insufficient documentation

## 2012-07-09 DIAGNOSIS — Z86718 Personal history of other venous thrombosis and embolism: Secondary | ICD-10-CM | POA: Insufficient documentation

## 2012-07-09 DIAGNOSIS — E119 Type 2 diabetes mellitus without complications: Secondary | ICD-10-CM | POA: Insufficient documentation

## 2012-07-09 DIAGNOSIS — Z8679 Personal history of other diseases of the circulatory system: Secondary | ICD-10-CM | POA: Insufficient documentation

## 2012-07-09 DIAGNOSIS — I129 Hypertensive chronic kidney disease with stage 1 through stage 4 chronic kidney disease, or unspecified chronic kidney disease: Secondary | ICD-10-CM | POA: Insufficient documentation

## 2012-07-09 DIAGNOSIS — I2510 Atherosclerotic heart disease of native coronary artery without angina pectoris: Secondary | ICD-10-CM | POA: Insufficient documentation

## 2012-07-09 DIAGNOSIS — Z79899 Other long term (current) drug therapy: Secondary | ICD-10-CM | POA: Insufficient documentation

## 2012-07-09 DIAGNOSIS — F17200 Nicotine dependence, unspecified, uncomplicated: Secondary | ICD-10-CM | POA: Insufficient documentation

## 2012-07-09 DIAGNOSIS — R0789 Other chest pain: Secondary | ICD-10-CM

## 2012-07-09 DIAGNOSIS — Z794 Long term (current) use of insulin: Secondary | ICD-10-CM | POA: Insufficient documentation

## 2012-07-09 DIAGNOSIS — E785 Hyperlipidemia, unspecified: Secondary | ICD-10-CM | POA: Insufficient documentation

## 2012-07-09 DIAGNOSIS — Z862 Personal history of diseases of the blood and blood-forming organs and certain disorders involving the immune mechanism: Secondary | ICD-10-CM | POA: Insufficient documentation

## 2012-07-09 DIAGNOSIS — R0602 Shortness of breath: Secondary | ICD-10-CM | POA: Insufficient documentation

## 2012-07-09 DIAGNOSIS — Z8781 Personal history of (healed) traumatic fracture: Secondary | ICD-10-CM | POA: Insufficient documentation

## 2012-07-09 DIAGNOSIS — Z9861 Coronary angioplasty status: Secondary | ICD-10-CM | POA: Insufficient documentation

## 2012-07-09 DIAGNOSIS — N182 Chronic kidney disease, stage 2 (mild): Secondary | ICD-10-CM | POA: Insufficient documentation

## 2012-07-09 LAB — BASIC METABOLIC PANEL
BUN: 34 mg/dL — ABNORMAL HIGH (ref 6–23)
Calcium: 9.1 mg/dL (ref 8.4–10.5)
Chloride: 101 mEq/L (ref 96–112)
Creatinine, Ser: 2.09 mg/dL — ABNORMAL HIGH (ref 0.50–1.35)
GFR calc Af Amer: 37 mL/min — ABNORMAL LOW (ref >90)
GFR calc non Af Amer: 32 mL/min — ABNORMAL LOW (ref >90)

## 2012-07-09 LAB — CBC WITH DIFFERENTIAL/PLATELET
Basophils Absolute: 0 10*3/uL (ref 0.0–0.1)
Eosinophils Absolute: 0.2 10*3/uL (ref 0.0–0.7)
HCT: 40.3 % (ref 39.0–52.0)
Lymphs Abs: 0.7 10*3/uL (ref 0.7–4.0)
MCHC: 29.5 g/dL — ABNORMAL LOW (ref 30.0–36.0)
MCV: 76.5 fL — ABNORMAL LOW (ref 78.0–100.0)
Monocytes Absolute: 0.5 10*3/uL (ref 0.1–1.0)
Neutro Abs: 2.5 10*3/uL (ref 1.7–7.7)
RDW: 28 % — ABNORMAL HIGH (ref 11.5–15.5)

## 2012-07-09 NOTE — ED Notes (Signed)
Patient states he was awakened by chest pain at approx 0230, denies sob,n,v,diaphoresis.  Patient given ASA 325 mg per EMS and Nitro x1 achieving pain relief.

## 2012-07-09 NOTE — ED Provider Notes (Signed)
History     CSN: [id number]  Arrival date & time 07/09/12  0272   First MD Initiated Contact with Patient 07/09/12 0345      Chief Complaint  Patient presents with  . Chest Pain    (Consider location/radiation/quality/duration/timing/severity/associated sxs/prior treatment) HPI 67 year old male presents to emergency department via EMS from home with complaint of left upper chest pain. Patient reports he woke to go to the bathroom, and when he was returning from the bathroom he had onset of pain. Pain was throbbing sensation. There is no radiation of the pain. He had no associated symptoms such as diaphoresis, nausea, shortness of breath. Pain has resolved after treatment with aspirin and nitroglycerin from EMS. Patient has significant history of congestive heart failure, with nonischemic cardiomyopathy. Patient was seen yesterday by his cardiologist. He reports he recently had admission due to fluid overload, had been diuresed down to 172 pounds. He reports he has since gained 24 pounds back. Patient reports he was told that he would probably need a heart transplant due to his failing heart.he denies any worsening dyspnea with exertion, he has had increased swelling in his legs and abdomen. He reports he is not limited in what he wants to do by his shortness of breath. Past Medical History  Diagnosis Date  . Venous thromboembolism     DVTs in 2004, 2005, 2007, 8/10, 8/11. IVC filter 2005. Off coumadin 04/2011 secondary to rectal bleeding  . 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 Chronic venous insufficiency   . DM2 (diabetes mellitus, type 2)   . GERD (gastroesophageal reflux disease)   . BPH (benign prostatic hypertrophy)   . Low back pain   . HTN (hypertension)   . HLD (hyperlipidemia)   . NICM (nonischemic cardiomyopathy) 1. 9/11  2. 2/12    1. Echo septal and apical akinesis, dilated LV, EF 45%, RV normal size and systolic function, EF 43% on Myoview  2.  Admitted with decompensated CHF, echo EF 30-35% with diffuse hypokinesis but akinesis of mid to apical anteroseptal wall and apex, moderate LVH, mild MR, grade I diastolic dysfunction, SPEP/UP negative and HIV negative. Most recent EF 10-15% 04/2011  . Smoking   . CAD (coronary artery disease) 1. 2007  2. 10/11    1. Left heart cath with 40-50% stenosis in small RCA EF 50%  2. Lexiscan myoview EF 43%, global hypokinesis, possible small area of apical ischemia; LHC no angiographic CAD, no LV-gram done due to CKD   2. No angiographic CAD by cath 02/2010  . CKD (chronic kidney disease) stage 2, GFR 60-89 ml/min as of 02/2011  . Headache   . Diabetes mellitus   . Blood transfusion   . Anemia   . Syncope     1. in setting of presumed orthostatic Hypotn 09/2011  . Right fibular fracture     1. 09/2011 - resulting from fall/syncope  . Closed right ankle fracture 10/10/2011    Past Surgical History  Procedure Laterality Date  . Cardiac catheterization    . Appendectomy    . Cholecystectomy    . Colonoscopy  05/01/2011    Procedure: COLONOSCOPY;  Surgeon: Louis Meckel, MD;  Location: [facility] ENDOSCOPY;  Service: Endoscopy;  Laterality: N/A;  . Orif ankle fracture  10/10/2011    Procedure: OPEN REDUCTION INTERNAL FIXATION (ORIF) ANKLE FRACTURE;  Surgeon: Eulas Post, MD;  Location: [facility] OR;  Service: Orthopedics;  Laterality: Right;  Right  Ankle Fracture ORIF    Family History  Problem Relation Age of Onset  . Coronary artery disease Neg Hx     Premature    History  Substance Use Topics  . Smoking status: Current Some Day Smoker -- 1.00 packs/day for 40 years    Types: Cigarettes    Last Attempt to Quit: 06/30/2011  . Smokeless tobacco: Never Used  . Alcohol Use: No      Review of Systems  All other systems reviewed and are negative.    Allergies  Review of patient's allergies indicates no known allergies.  Home Medications   Current Outpatient Rx  Name  Route  Sig  Dispense   Refill  . acetaminophen (TYLENOL) 500 MG tablet   Oral   Take 500 mg by mouth every 6 (six) hours as needed for pain.         Marland Kitchen atorvastatin (LIPITOR) 20 MG tablet   Oral   Take 20 mg by mouth every morning.          . digoxin (LANOXIN) 0.125 MG tablet   Oral   Take 1 tablet (0.125 mg total) by mouth daily.   90 tablet   3   . eplerenone (INSPRA) 25 MG tablet   Oral   Take 1 tablet (25 mg total) by mouth daily.   90 tablet   3   . fluticasone (CUTIVATE) 0.005 % ointment   Topical   Apply 1 application topically 2 (two) times daily. For affected areas         . glipiZIDE (GLUCOTROL XL) 5 MG 24 hr tablet   Oral   Take 5 mg by mouth daily.          . hydrALAZINE (APRESOLINE) 25 MG tablet   Oral   Take 1 tablet (25 mg total) by mouth 3 (three) times daily.   90 tablet   6   . insulin glargine (LANTUS) 100 UNIT/ML injection   Subcutaneous   Inject 20 Units into the skin at bedtime.          . isosorbide mononitrate (IMDUR) 30 MG 24 hr tablet   Oral   Take 30 mg by mouth daily.          . meclizine (ANTIVERT) 25 MG tablet   Oral   Take 25 mg by mouth 3 (three) times daily as needed. For vertigo         . metolazone (ZAROXOLYN) 2.5 MG tablet   Oral   Take 1 tablet (2.5 mg total) by mouth as needed.   10 tablet   3   . metoprolol succinate (TOPROL-XL) 50 MG 24 hr tablet   Oral   Take 50 mg by mouth daily. Take with or immediately following a meal.         . naproxen sodium (ANAPROX) 220 MG tablet   Oral   Take 440 mg by mouth 3 (three) times daily with meals.         . pantoprazole (PROTONIX) 40 MG tablet   Oral   Take 1 tablet (40 mg total) by mouth daily.   30 tablet   4   . Tamsulosin HCl (FLOMAX) 0.4 MG CAPS   Oral   Take 1 capsule (0.4 mg total) by mouth at bedtime.   90 capsule   1   . torsemide (DEMADEX) 20 MG tablet   Oral   Take 1 tablet (20 mg total) by mouth 2 (two) times daily.         Marland Kitchen  traMADol (ULTRAM) 50 MG  tablet   Oral   Take 50 mg by mouth every 6 (six) hours as needed. For pain           BP 110/87  Pulse 107  Temp(Src) 97.8 F (36.6 C) (Oral)  Resp 21  SpO2 98%  Physical Exam  Constitutional: He is oriented to person, place, and time. He appears well-developed and well-nourished. No distress.  HENT:  Head: Normocephalic and atraumatic.  Nose: Nose normal.  Mouth/Throat: Oropharynx is clear and moist. No oropharyngeal exudate.  Eyes: Conjunctivae and EOM are normal. Pupils are equal, round, and reactive to light. Right eye exhibits no discharge. Left eye exhibits no discharge.  Neck: Normal range of motion. Neck supple. No JVD present. No tracheal deviation present. No thyromegaly present.  Cardiovascular: Normal rate, regular rhythm, normal heart sounds and intact distal pulses.  Exam reveals no gallop and no friction rub.   No murmur heard. Pulmonary/Chest: Effort normal. No stridor. No respiratory distress. He has no wheezes. He has rales (in bases bilaterally). He exhibits no tenderness.  Abdominal: Soft. Bowel sounds are normal. He exhibits no distension and no mass. There is no tenderness. There is no rebound and no guarding.  Musculoskeletal: Normal range of motion. He exhibits edema. He exhibits no tenderness.  Lymphadenopathy:    He has no cervical adenopathy.  Neurological: He is alert and oriented to person, place, and time. He displays normal reflexes. He exhibits normal muscle tone. Coordination normal.  Skin: Skin is warm and dry. Rash noted. No erythema. No pallor.    ED Course  Procedures (including critical care time)  Labs Reviewed  CBC WITH DIFFERENTIAL - Abnormal; Notable for the following:    WBC 3.9 (*)    Hemoglobin 11.9 (*)    MCV 76.5 (*)    MCH 22.6 (*)    MCHC 29.5 (*)    RDW 28.0 (*)    Monocytes Relative 13 (*)    All other components within normal limits  BASIC METABOLIC PANEL - Abnormal; Notable for the following:    Potassium 3.1 (*)     Glucose, Bld 101 (*)    BUN 34 (*)    Creatinine, Ser 2.09 (*)    GFR calc non Af Amer 32 (*)    GFR calc Af Amer 37 (*)    All other components within normal limits  PRO B NATRIURETIC PEPTIDE - Abnormal; Notable for the following:    Pro B Natriuretic peptide (BNP) 3300.0 (*)    All other components within normal limits  TROPONIN I   Dg Chest 2 View  07/09/2012  *RADIOLOGY REPORT*  Clinical Data: Chest pain  CHEST - 2 VIEW  Comparison: 06/16/2012  Findings: Heart size upper normal to mildly enlarged. Aortic arch atherosclerotic calcification.  Small pleural effusions, left greater than right, with associated airspace opacities.  No pneumothorax.  Surgical clips right upper quadrant.  No acute osseous finding. Mild L1 height loss is similar to prior.  IMPRESSION: Small pleural effusions, right greater than left, with associated airspace opacities; atelectasis versus infiltrate.   Original Report Authenticated By: Jearld Lesch, M.D.      Date: 07/09/2012  Rate: 109  Rhythm: sinus tachycardia  QRS Axis: right  Intervals: normal  ST/T Wave abnormalities: nonspecific T wave changes  Conduction Disutrbances:none  Narrative Interpretation:   Old EKG Reviewed: unchanged   1. CHF (congestive heart failure)   2. Chest pain, atypical  MDM  67 year old male with brief chest pain resolved after aspirin and one nitroglycerin.we'll get baseline labs, chest x-ray. EKG without ischemic changes. Will discuss with his cardiologist for either close followup, or admission.      5:59 AM Patient with elevation in his BNP, no significant fluid overload on physical exam. Patient has close followup scheduled with his cardiologist next week. Initial troponin is negative. Discussed with on-call [facility] cardiology who recommends second troponin and followup with his cardiologist as scheduled.  Olivia Mackie, MD 07/09/12 [phone]

## 2012-07-12 ENCOUNTER — Ambulatory Visit (HOSPITAL_COMMUNITY)
Admission: RE | Admit: 2012-07-12 | Discharge: 2012-07-12 | Disposition: A | Discharge status: Home / Self Care | Payer: PRIVATE HEALTH INSURANCE | Source: Ambulatory Visit | Attending: Internal Medicine | Admitting: Internal Medicine

## 2012-07-12 VITALS — HR 125 | Wt 182.0 lb

## 2012-07-12 DIAGNOSIS — I5022 Chronic systolic (congestive) heart failure: Secondary | ICD-10-CM | POA: Insufficient documentation

## 2012-07-12 LAB — BASIC METABOLIC PANEL
CO2: 31 mEq/L (ref 19–32)
Calcium: 9.4 mg/dL (ref 8.4–10.5)
Glucose, Bld: 133 mg/dL — ABNORMAL HIGH (ref 70–99)
Sodium: 137 mEq/L (ref 135–145)

## 2012-07-12 MED ORDER — POTASSIUM CHLORIDE ER 10 MEQ PO TBCR: EXTENDED_RELEASE_TABLET | ORAL | Status: DC

## 2012-07-12 NOTE — Progress Notes (Signed)
Patient ID: [name], male   DOB: 27-Jun-1945, 67 y.o.   MRN: [id number] PCP: Dr Pecola Leisure  HPI:  [name] is a 67 year old male with chronic systolic CHF secondary to NICM echo EF 30-35%, HTN, anemia, chronic renal insufficiency Cr 1.31,  UGI bleed,  duodenal AVMs,  rectal bleeding 04/2011,  DVTs (2004, 2005, 2007, 2010, 2011) however coumadin on hold due to GI bleed, IVC filter 2005, GERD, BPD, and chronic low back .  09/2011 S/P ORIF L ankle. Spironolactone switched to eplerenone due to gynecomastia.  Discharged 06/25/11  Weight 159 pounds.  RHC 2/13 RA = 4  RV = 32/6 (7)  PA = 45/15 (24)  PCW = 7  Fick cardiac output/index = 5.2/2.7  Thermo = 4.0/2.1  PVR = 3.4 Woods  FA sat = 94%  PA sat = 56%, 57%  Noninvasive BP : 125/92 (72)  Calculated SVR = 1040   08/13/11 CPX  RER 1.16 Peak VO2 18.6 ml/kg/min Predicted 61.4% VE/VCO2 slope 42.8 08/23/2011 EF 30-35%  Admitted from clinic 05/30/2012 with ~40-50 volume overload. Diuresed with Milrinone and Lasix. Diuresed 41 pounds. Milrinone and lasix gtt weaned off. CO-OX dropped 56>50>43. Beta blocker was stopped due to low output. He was offered home Milrinone however he declined.  Discharge weight 175 pounds.   Presented to [facility] ED 07/08/12 with increased dyspnea and increased heart rate. CE negative. Pro BNP 3300. Received baby aspirin and nitroglycerin. He was later released with follow up to the HF clinic today.   He returns for follow up. Last office visit  he was given Metolazone 2.5 mg for 2 days and and he continued on Torsemide 20 mg twice a day (weight 196 pounds) . Overall feeling much better. Denies SOB/PND/Orthopnea. Weight at home 180 pounds.  He has an aide 58 hours per week. Compliant medications. Drinking less < 2 liters per day. Tries to follow low salt diet.      ROS: All systems negative except as listed in HPI, PMH and Problem List.  Past Medical History  Diagnosis Date  . Venous thromboembolism     DVTs in 2004, 2005,  2007, 8/10, 8/11. IVC filter 2005. Off coumadin 04/2011 secondary to rectal bleeding  . 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 Chronic venous insufficiency   . DM2 (diabetes mellitus, type 2)   . GERD (gastroesophageal reflux disease)   . BPH (benign prostatic hypertrophy)   . Low back pain   . HTN (hypertension)   . HLD (hyperlipidemia)   . NICM (nonischemic cardiomyopathy) 1. 9/11  2. 2/12    1. Echo septal and apical akinesis, dilated LV, EF 45%, RV normal size and systolic function, EF 43% on Myoview  2. Admitted with decompensated CHF, echo EF 30-35% with diffuse hypokinesis but akinesis of mid to apical anteroseptal wall and apex, moderate LVH, mild MR, grade I diastolic dysfunction, SPEP/UP negative and HIV negative. Most recent EF 10-15% 04/2011  . Smoking   . CAD (coronary artery disease) 1. 2007  2. 10/11    1. Left heart cath with 40-50% stenosis in small RCA EF 50%  2. Lexiscan myoview EF 43%, global hypokinesis, possible small area of apical ischemia; LHC no angiographic CAD, no LV-gram done due to CKD   2. No angiographic CAD by cath 02/2010  . CKD (chronic kidney disease) stage 2, GFR 60-89 ml/min as of 02/2011  . Headache   . Diabetes mellitus   .  Blood transfusion   . Anemia   . Syncope     1. in setting of presumed orthostatic Hypotn 09/2011  . Right fibular fracture     1. 09/2011 - resulting from fall/syncope  . Closed right ankle fracture 10/10/2011    Current Outpatient Prescriptions  Medication Sig Dispense Refill  . acetaminophen (TYLENOL) 500 MG tablet Take 500 mg by mouth every 6 (six) hours as needed for pain.      Marland Kitchen atorvastatin (LIPITOR) 20 MG tablet Take 20 mg by mouth every morning.       . digoxin (LANOXIN) 0.125 MG tablet Take 1 tablet (0.125 mg total) by mouth daily.  90 tablet  3  . eplerenone (INSPRA) 25 MG tablet Take 1 tablet (25 mg total) by mouth daily.  90 tablet  3  . fluticasone (CUTIVATE) 0.005 % ointment Apply 1  application topically 2 (two) times daily. For affected areas      . glipiZIDE (GLUCOTROL XL) 5 MG 24 hr tablet Take 5 mg by mouth daily.       . hydrALAZINE (APRESOLINE) 25 MG tablet Take 1 tablet (25 mg total) by mouth 3 (three) times daily.  90 tablet  6  . insulin glargine (LANTUS) 100 UNIT/ML injection Inject 20 Units into the skin at bedtime.       . isosorbide mononitrate (IMDUR) 30 MG 24 hr tablet Take 30 mg by mouth daily.       . meclizine (ANTIVERT) 25 MG tablet Take 25 mg by mouth 3 (three) times daily as needed. For vertigo      . metolazone (ZAROXOLYN) 2.5 MG tablet Take 1 tablet (2.5 mg total) by mouth as needed.  10 tablet  3  . metoprolol succinate (TOPROL-XL) 50 MG 24 hr tablet Take 50 mg by mouth daily. Take with or immediately following a meal.      . naproxen sodium (ANAPROX) 220 MG tablet Take 440 mg by mouth 3 (three) times daily with meals.      . pantoprazole (PROTONIX) 40 MG tablet Take 1 tablet (40 mg total) by mouth daily.  30 tablet  4  . Tamsulosin HCl (FLOMAX) 0.4 MG CAPS Take 1 capsule (0.4 mg total) by mouth at bedtime.  90 capsule  1  . torsemide (DEMADEX) 20 MG tablet Take 1 tablet (20 mg total) by mouth 2 (two) times daily.      . traMADol (ULTRAM) 50 MG tablet Take 50 mg by mouth every 6 (six) hours as needed. For pain       No current facility-administered medications for this encounter.     PHYSICAL EXAM: Filed Vitals:   07/12/12 1408  Pulse: 125  Weight: 182 lb (82.555 kg)  SpO2: 100%   Weight 196>182 pounds General: Comfortable. No acute distress.  HEENT: normal except for poor dentition Neck: supple. JVP 9 Carotids 2+ bilaterally; no bruits. No lymphadenopathy or thryomegaly appreciated. Cor: PMI displaced laterally. Tachycardic regular rate and rhythm. +S3 Lungs: clear Abdomen: Non tender. Good bowel sounds. Mildly distended Extremities: no cyanosis, clubbing, rash, no edema.  RLE/ LLE trace    Neuro: alert & orientedx3, cranial nerves  grossly intact. Moves all 4 extremities w/o difficulty. Affect pleasant.    ASSESSMENT & PLAN:

## 2012-07-12 NOTE — Patient Instructions (Addendum)
Follow up next Monday   Do the following things EVERYDAY: 1) Weigh yourself in the morning before breakfast. Write it down and keep it in a log. 2) Take your medicines as prescribed 3) Eat low salt foods-Limit salt (sodium) to 2000 mg per day.  4) Stay as active as you can everyday 5) Limit all fluids for the day to less than 2 liters+ 

## 2012-07-12 NOTE — Assessment & Plan Note (Addendum)
NYHA IIIB. Volume status improving but remains elevated. Continue Torsemide 20 mg twice a day. Check BMET if creatinine < 2 will give Metolazone 2.5  X 2 days. If creatinine trending up will need RHC this week. No beta blocker due to low output. Reinforced in low salt food choices, limiting fluid intake to < 2 liters per day and daily weights. Follow up next week to reassess volume status.   Patient seen and examined with [name], NP. We discussed all aspects of the encounter. I agree with the assessment and plan as stated above.  Weight improved but still up signiifcantly. I suspect he will need home milrinone. Will push diuretics and check labs. If volume status not improving or renal function worse will plan RHC next week. Long talk about the tenuousness of his situation. Reinforced need for daily weights and reviewed use of sliding scale diuretics.

## 2012-07-19 ENCOUNTER — Ambulatory Visit (HOSPITAL_COMMUNITY)
Admission: RE | Admit: 2012-07-19 | Discharge: 2012-07-19 | Disposition: A | Discharge status: Home / Self Care | Payer: PRIVATE HEALTH INSURANCE | Source: Ambulatory Visit | Attending: Internal Medicine | Admitting: Internal Medicine

## 2012-07-19 VITALS — BP 108/58 | HR 122 | Wt 179.5 lb

## 2012-07-19 DIAGNOSIS — I5043 Acute on chronic combined systolic (congestive) and diastolic (congestive) heart failure: Secondary | ICD-10-CM | POA: Insufficient documentation

## 2012-07-19 LAB — BASIC METABOLIC PANEL
BUN: 35 mg/dL — ABNORMAL HIGH (ref 6–23)
GFR calc non Af Amer: 29 mL/min — ABNORMAL LOW (ref >90)
Glucose, Bld: 130 mg/dL — ABNORMAL HIGH (ref 70–99)
Potassium: 3.1 mEq/L — ABNORMAL LOW (ref 3.5–5.1)

## 2012-07-19 MED ORDER — METOLAZONE 2.5 MG PO TABS: 2.5000 mg | ORAL_TABLET | ORAL | Status: DC

## 2012-07-19 NOTE — Assessment & Plan Note (Addendum)
Volume status much improved. Instructed to only take Metolazone 2.5 mg on Saturdays. Reinforced limiting fluid intake to < 2 liters per day, low salt food choices, and medication compliance, Check BMET today. Will need RHC if renal function declines. Follow up in 2 weeks.

## 2012-07-19 NOTE — Patient Instructions (Signed)
Take metolazone 2.5 mg every Saturday  Do the following things EVERYDAY: 1) Weigh yourself in the morning before breakfast. Write it down and keep it in a log. 2) Take your medicines as prescribed 3) Eat low salt foods-Limit salt (sodium) to 2000 mg per day.  4) Stay as active as you can everyday 5) Limit all fluids for the day to less than 2 liters

## 2012-07-19 NOTE — Progress Notes (Signed)
Patient ID: [name], male   DOB: 1946-02-20, 67 y.o.   MRN: [id number] PCP: Dr Pecola Leisure  HPI:  [name] is a 67 year old male with chronic systolic CHF secondary to NICM echo EF 30-35%, HTN, anemia, chronic renal insufficiency Cr 1.31,  UGI bleed,  duodenal AVMs,  rectal bleeding 04/2011,  DVTs (2004, 2005, 2007, 2010, 2011) however coumadin on hold due to GI bleed, IVC filter 2005, GERD, BPD, and chronic low back .  09/2011 S/P ORIF L ankle. Spironolactone switched to eplerenone due to gynecomastia.  Discharged 06/25/11  Weight 159 pounds.  RHC 2/13 RA = 4  RV = 32/6 (7)  PA = 45/15 (24)  PCW = 7  Fick cardiac output/index = 5.2/2.7  Thermo = 4.0/2.1  PVR = 3.4 Woods  FA sat = 94%  PA sat = 56%, 57%  Noninvasive BP : 125/92 (72)  Calculated SVR = 1040   08/13/11 CPX  RER 1.16 Peak VO2 18.6 ml/kg/min Predicted 61.4% VE/VCO2 slope 42.8 08/23/2011 EF 30-35%  Admitted from clinic 05/30/2012 with ~40-50 volume overload. Diuresed with Milrinone and Lasix. Diuresed 41 pounds. Milrinone and lasix gtt weaned off. CO-OX dropped 56>50>43. Beta blocker was stopped due to low output. He was offered home Milrinone however he declined.  Discharge weight 175 pounds.   Presented to [facility] ED 07/08/12 with increased dyspnea and increased heart rate. CE negative. Pro BNP 3300. Received baby aspirin and nitroglycerin. He was later released with follow up to the HF clinic today.  07/12/12 Creatinine 1.74 K 3.0- K supplemented.   He returns for follow up. Last visit he was instructed to take Metolazone2.5 mg for 2 days due to increased volume status. Denies SOB/PND/Orthopnea. Weight at home down to174 pounds. Compliant with medications.  He has been taking metolazone every other day. He has an aide 58 hours per week.  Drinking less < 2 liters per day. Tries to follow low salt diet. Refuses home health services.      ROS: All systems negative except as listed in HPI, PMH and Problem List.  Past Medical History   Diagnosis Date  . Venous thromboembolism     DVTs in 2004, 2005, 2007, 8/10, 8/11. IVC filter 2005. Off coumadin 04/2011 secondary to rectal bleeding  . 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 Chronic venous insufficiency   . DM2 (diabetes mellitus, type 2)   . GERD (gastroesophageal reflux disease)   . BPH (benign prostatic hypertrophy)   . Low back pain   . HTN (hypertension)   . HLD (hyperlipidemia)   . NICM (nonischemic cardiomyopathy) 1. 9/11  2. 2/12    1. Echo septal and apical akinesis, dilated LV, EF 45%, RV normal size and systolic function, EF 43% on Myoview  2. Admitted with decompensated CHF, echo EF 30-35% with diffuse hypokinesis but akinesis of mid to apical anteroseptal wall and apex, moderate LVH, mild MR, grade I diastolic dysfunction, SPEP/UP negative and HIV negative. Most recent EF 10-15% 04/2011  . Smoking   . CAD (coronary artery disease) 1. 2007  2. 10/11    1. Left heart cath with 40-50% stenosis in small RCA EF 50%  2. Lexiscan myoview EF 43%, global hypokinesis, possible small area of apical ischemia; LHC no angiographic CAD, no LV-gram done due to CKD   2. No angiographic CAD by cath 02/2010  . CKD (chronic kidney disease) stage 2, GFR 60-89 ml/min as of 02/2011  .  Headache   . Diabetes mellitus   . Blood transfusion   . Anemia   . Syncope     1. in setting of presumed orthostatic Hypotn 09/2011  . Right fibular fracture     1. 09/2011 - resulting from fall/syncope  . Closed right ankle fracture 10/10/2011    Current Outpatient Prescriptions  Medication Sig Dispense Refill  . acetaminophen (TYLENOL) 500 MG tablet Take 500 mg by mouth every 6 (six) hours as needed for pain.      Marland Kitchen atorvastatin (LIPITOR) 20 MG tablet Take 20 mg by mouth every morning.       . digoxin (LANOXIN) 0.125 MG tablet Take 1 tablet (0.125 mg total) by mouth daily.  90 tablet  3  . eplerenone (INSPRA) 25 MG tablet Take 1 tablet (25 mg total) by mouth daily.   90 tablet  3  . fluticasone (CUTIVATE) 0.005 % ointment Apply 1 application topically 2 (two) times daily. For affected areas      . glipiZIDE (GLUCOTROL XL) 5 MG 24 hr tablet Take 5 mg by mouth daily.       . hydrALAZINE (APRESOLINE) 25 MG tablet Take 1 tablet (25 mg total) by mouth 3 (three) times daily.  90 tablet  6  . insulin glargine (LANTUS) 100 UNIT/ML injection Inject 20 Units into the skin at bedtime.       . isosorbide mononitrate (IMDUR) 30 MG 24 hr tablet Take 30 mg by mouth daily.       . meclizine (ANTIVERT) 25 MG tablet Take 25 mg by mouth 3 (three) times daily as needed. For vertigo      . metolazone (ZAROXOLYN) 2.5 MG tablet Take 1 tablet (2.5 mg total) by mouth as needed.  10 tablet  3  . metoprolol succinate (TOPROL-XL) 50 MG 24 hr tablet Take 50 mg by mouth daily. Take with or immediately following a meal.      . naproxen sodium (ANAPROX) 220 MG tablet Take 440 mg by mouth 3 (three) times daily with meals.      . pantoprazole (PROTONIX) 40 MG tablet Take 1 tablet (40 mg total) by mouth daily.  30 tablet  4  . potassium chloride (K-DUR) 10 MEQ tablet Take eight today ( ) today 07/12/2012, and then  four tabs daily ( )  120 tablet  0  . Tamsulosin HCl (FLOMAX) 0.4 MG CAPS Take 1 capsule (0.4 mg total) by mouth at bedtime.  90 capsule  1  . torsemide (DEMADEX) 20 MG tablet Take 1 tablet (20 mg total) by mouth 2 (two) times daily.      . traMADol (ULTRAM) 50 MG tablet Take 50 mg by mouth every 6 (six) hours as needed. For pain       No current facility-administered medications for this encounter.     PHYSICAL EXAM: Filed Vitals:   07/19/12 1408  BP: 108/58  Pulse: 122  Weight: 179 lb 8 oz (81.421 kg)  SpO2: 100%   Weight 196>182>179  pounds General: Comfortable. No acute distress.  HEENT: normal except for poor dentition Neck: supple. JVP 6-7 Carotids 2+ bilaterally; no bruits. No lymphadenopathy or thryomegaly appreciated. Cor: PMI displaced laterally.  Tachycardic regular rate and rhythm. +S3 Lungs: clear Abdomen: Non tender. Good bowel sounds. Mildly distended Extremities: no cyanosis, clubbing, rash, no edema  RLE/ LLE    Neuro: alert & orientedx3, cranial nerves grossly intact. Moves all 4 extremities w/o difficulty. Affect pleasant.    ASSESSMENT & PLAN:

## 2012-07-20 ENCOUNTER — Telehealth (HOSPITAL_COMMUNITY): Payer: Self-pay | Admitting: *Deleted

## 2012-07-20 MED ORDER — POTASSIUM CHLORIDE ER 20 MEQ PO TBCR: 80.0000 meq | EXTENDED_RELEASE_TABLET | Freq: Every day | ORAL | Status: DC

## 2012-07-20 NOTE — Telephone Encounter (Signed)
Per [name], NP increase KCL to 80 meq daily and hold torsemide and metolazone for 2 days, pt is aware and new rx sent in, he will go for recheck labs on Mon 3/10 at [facility]

## 2012-07-26 ENCOUNTER — Other Ambulatory Visit (INDEPENDENT_AMBULATORY_CARE_PROVIDER_SITE_OTHER): Payer: PRIVATE HEALTH INSURANCE

## 2012-07-26 DIAGNOSIS — I5023 Acute on chronic systolic (congestive) heart failure: Secondary | ICD-10-CM

## 2012-07-26 LAB — BASIC METABOLIC PANEL
BUN: 35 mg/dL — ABNORMAL HIGH (ref 6–23)
CO2: 25 mEq/L (ref 19–32)
Chloride: 97 mEq/L (ref 96–112)
Creatinine, Ser: 2.6 mg/dL — ABNORMAL HIGH (ref 0.4–1.5)

## 2012-08-02 ENCOUNTER — Ambulatory Visit (HOSPITAL_COMMUNITY): Payer: PRIVATE HEALTH INSURANCE

## 2012-08-05 ENCOUNTER — Telehealth: Payer: Self-pay

## 2012-08-05 NOTE — Telephone Encounter (Signed)
Called patient to ask him about Hydralazine and Eplerenone medication. Got a fax from [facility] stating patient reported these medications were D/C'd. Patient was rude on the phone and said he didn't know what these medications were and he doesn't take them. I explained to patient that I was trying to figure out his medication list for him and I would note in my telephone encounter that he no longer takes Hydralazine and Eplerenone.

## 2012-08-10 ENCOUNTER — Encounter (HOSPITAL_COMMUNITY): Payer: Self-pay

## 2012-08-10 ENCOUNTER — Ambulatory Visit (HOSPITAL_COMMUNITY)
Admission: RE | Admit: 2012-08-10 | Discharge: 2012-08-10 | Disposition: A | Discharge status: Home / Self Care | Payer: PRIVATE HEALTH INSURANCE | Source: Ambulatory Visit | Attending: Internal Medicine | Admitting: Internal Medicine

## 2012-08-10 VITALS — BP 108/66 | HR 122 | Wt 185.8 lb

## 2012-08-10 DIAGNOSIS — I5022 Chronic systolic (congestive) heart failure: Secondary | ICD-10-CM | POA: Insufficient documentation

## 2012-08-10 DIAGNOSIS — N189 Chronic kidney disease, unspecified: Secondary | ICD-10-CM | POA: Insufficient documentation

## 2012-08-10 DIAGNOSIS — I509 Heart failure, unspecified: Secondary | ICD-10-CM | POA: Insufficient documentation

## 2012-08-10 LAB — BASIC METABOLIC PANEL
BUN: 24 mg/dL — ABNORMAL HIGH (ref 6–23)
Calcium: 9.6 mg/dL (ref 8.4–10.5)
Creatinine, Ser: 1.99 mg/dL — ABNORMAL HIGH (ref 0.50–1.35)
GFR calc non Af Amer: 34 mL/min — ABNORMAL LOW (ref >90)
Glucose, Bld: 94 mg/dL (ref 70–99)
Sodium: 135 mEq/L (ref 135–145)

## 2012-08-10 MED ORDER — EPLERENONE 25 MG PO TABS: 25.0000 mg | ORAL_TABLET | Freq: Every day | ORAL | Status: DC

## 2012-08-10 NOTE — Progress Notes (Signed)
PCP: Dr Pecola Leisure  HPI:  Mr. Galbraith is a 67 year old male with chronic systolic CHF secondary to NICM echo EF 30-35%, HTN, anemia, chronic renal insufficiency Cr 1.31,  UGI bleed,  duodenal AVMs,  rectal bleeding 04/2011,  DVTs (2004, 2005, 2007, 2010, 2011) however coumadin on hold due to GI bleed, IVC filter 2005, GERD, BPD, and chronic low back .  09/2011 S/P ORIF L ankle. Spironolactone switched to eplerenone due to gynecomastia.  Discharged 06/25/11  Weight 159 pounds.  RHC 2/13 RA = 4  RV = 32/6 (7)  PA = 45/15 (24)  PCW = 7  Fick cardiac output/index = 5.2/2.7  Thermo = 4.0/2.1  PVR = 3.4 Woods  FA sat = 94%  PA sat = 56%, 57%  Noninvasive BP : 125/92 (72)  Calculated SVR = 1040   08/13/11 CPX  RER 1.16 Peak VO2 18.6 ml/kg/min Predicted 61.4% VE/VCO2 slope 42.8 08/23/2011 EF 30-35%  Admitted from clinic 05/30/2012 with ~40-50 volume overload. Diuresed with Milrinone and Lasix. Diuresed 41 pounds. Milrinone and lasix gtt weaned off. CO-OX dropped 56>50>43. Beta blocker was stopped due to low output. He was offered home Milrinone however he declined.  Discharge weight 175 pounds.   Presented to [facility] ED 07/08/12 with increased dyspnea and increased heart rate. CE negative. Pro BNP 3300. Received baby aspirin and nitroglycerin.   He returns for follow up today.  Last visit metolazone was cut back.  Since that time he has experienced lightheadedness/dizziness and stopped both his inspra and imdur.  He states he has had improvement in his symptoms since this change.  He denies dyspnea, or orthopnea/PND.  SBP 100s.  Chronic 2 pillow orthopnea.  Weight ranges 183-187 pounds.  Has aide several hours a day.  Going to [facility] for vacation in a couple of weeks.     ROS: All systems negative except as listed in HPI, PMH and Problem List.  Past Medical History  Diagnosis Date  . Venous thromboembolism     DVTs in 2004, 2005, 2007, 8/10, 8/11. IVC filter 2005. Off coumadin 04/2011 secondary to rectal  bleeding  . 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 Chronic venous insufficiency   . DM2 (diabetes mellitus, type 2)   . GERD (gastroesophageal reflux disease)   . BPH (benign prostatic hypertrophy)   . Low back pain   . HTN (hypertension)   . HLD (hyperlipidemia)   . NICM (nonischemic cardiomyopathy) 1. 9/11  2. 2/12    1. Echo septal and apical akinesis, dilated LV, EF 45%, RV normal size and systolic function, EF 43% on Myoview  2. Admitted with decompensated CHF, echo EF 30-35% with diffuse hypokinesis but akinesis of mid to apical anteroseptal wall and apex, moderate LVH, mild MR, grade I diastolic dysfunction, SPEP/UP negative and HIV negative. Most recent EF 10-15% 04/2011  . Smoking   . CAD (coronary artery disease) 1. 2007  2. 10/11    1. Left heart cath with 40-50% stenosis in small RCA EF 50%  2. Lexiscan myoview EF 43%, global hypokinesis, possible small area of apical ischemia; LHC no angiographic CAD, no LV-gram done due to CKD   2. No angiographic CAD by cath 02/2010  . CKD (chronic kidney disease) stage 2, GFR 60-89 ml/min as of 02/2011  . Headache   . Diabetes mellitus   . Blood transfusion   . Anemia   . Syncope     1. in setting  of presumed orthostatic Hypotn 09/2011  . Right fibular fracture     1. 09/2011 - resulting from fall/syncope  . Closed right ankle fracture 10/10/2011    Current Outpatient Prescriptions  Medication Sig Dispense Refill  . acetaminophen (TYLENOL) 500 MG tablet Take 500 mg by mouth every 6 (six) hours as needed for pain.      Marland Kitchen atorvastatin (LIPITOR) 20 MG tablet Take 20 mg by mouth every morning.       . digoxin (LANOXIN) 0.125 MG tablet Take 1 tablet (0.125 mg total) by mouth daily.  90 tablet  3  . fluticasone (CUTIVATE) 0.005 % ointment Apply 1 application topically 2 (two) times daily. For affected areas      . glipiZIDE (GLUCOTROL XL) 5 MG 24 hr tablet Take 5 mg by mouth daily.       . isosorbide mononitrate  (IMDUR) 30 MG 24 hr tablet Take 30 mg by mouth daily.       . meclizine (ANTIVERT) 25 MG tablet Take 25 mg by mouth 3 (three) times daily as needed. For vertigo      . metolazone (ZAROXOLYN) 2.5 MG tablet Take 1 tablet (2.5 mg total) by mouth every Saturday at 6 PM.  10 tablet  3  . metoprolol succinate (TOPROL-XL) 50 MG 24 hr tablet Take 50 mg by mouth daily. Take with or immediately following a meal.      . naproxen sodium (ANAPROX) 220 MG tablet Take 440 mg by mouth 3 (three) times daily with meals.      . pantoprazole (PROTONIX) 40 MG tablet Take 1 tablet (40 mg total) by mouth daily.  30 tablet  4  . Potassium Chloride ER 20 MEQ TBCR Take 40 mEq by mouth daily.      . Tamsulosin HCl (FLOMAX) 0.4 MG CAPS Take 1 capsule (0.4 mg total) by mouth at bedtime.  90 capsule  1  . torsemide (DEMADEX) 20 MG tablet Take 20 mg by mouth daily.      . traMADol (ULTRAM) 50 MG tablet Take 50 mg by mouth every 6 (six) hours as needed. For pain       No current facility-administered medications for this encounter.     PHYSICAL EXAM: Filed Vitals:   08/10/12 1413  BP: 108/66  Pulse: 122  Weight: 185 lb 12 oz (84.256 kg)  SpO2: 97%   General: Comfortable. No acute distress.  HEENT: normal except for poor dentition Neck: supple. JVP 6-7 Carotids 2+ bilaterally; no bruits. No lymphadenopathy or thryomegaly appreciated. Cor: PMI displaced laterally. Tachycardic regular rate and rhythm. +S3 Lungs: clear Abdomen: Non tender. Good bowel sounds. + distention Extremities: no cyanosis, clubbing, rash, tr edema  RLE/ LLE    Neuro: alert & orientedx3, cranial nerves grossly intact. Moves all 4 extremities w/o difficulty. Affect pleasant.    ASSESSMENT & PLAN:

## 2012-08-10 NOTE — Patient Instructions (Addendum)
Increase torsemide 2 tabs for three days.  Start inspra back.   Labs today.  Follow up 1 month.

## 2012-08-11 NOTE — Assessment & Plan Note (Signed)
BMET today to follow renal function.

## 2012-08-11 NOTE — Assessment & Plan Note (Signed)
Volume status trending up.  Will increase torsemide 40 mg for 3 days.  Have also discussed the importance of notifying the clinic prior to discontinuation of medications.  Will try to restart inspra as the patient has had multiple episodes of hypokalemia.  He agrees to retry inspra.  Will repeat labs next week.

## 2012-08-25 ENCOUNTER — Other Ambulatory Visit: Payer: Self-pay | Admitting: *Deleted

## 2012-08-25 MED ORDER — TORSEMIDE 20 MG PO TABS: 20.0000 mg | ORAL_TABLET | Freq: Every day | ORAL | Status: DC

## 2012-08-25 MED ORDER — DIGOXIN 125 MCG PO TABS: 0.1250 mg | ORAL_TABLET | Freq: Every day | ORAL | Status: DC

## 2012-08-27 ENCOUNTER — Emergency Department (HOSPITAL_COMMUNITY)
Admission: EM | Admit: 2012-08-27 | Discharge: 2012-08-27 | Disposition: A | Discharge status: Home / Self Care | Payer: PRIVATE HEALTH INSURANCE | Attending: Emergency Medicine | Admitting: Emergency Medicine

## 2012-08-27 ENCOUNTER — Encounter (HOSPITAL_COMMUNITY): Payer: Self-pay | Admitting: Emergency Medicine

## 2012-08-27 DIAGNOSIS — R142 Eructation: Secondary | ICD-10-CM | POA: Insufficient documentation

## 2012-08-27 DIAGNOSIS — Z8679 Personal history of other diseases of the circulatory system: Secondary | ICD-10-CM | POA: Insufficient documentation

## 2012-08-27 DIAGNOSIS — R338 Other retention of urine: Secondary | ICD-10-CM

## 2012-08-27 DIAGNOSIS — Z862 Personal history of diseases of the blood and blood-forming organs and certain disorders involving the immune mechanism: Secondary | ICD-10-CM | POA: Insufficient documentation

## 2012-08-27 DIAGNOSIS — I129 Hypertensive chronic kidney disease with stage 1 through stage 4 chronic kidney disease, or unspecified chronic kidney disease: Secondary | ICD-10-CM | POA: Insufficient documentation

## 2012-08-27 DIAGNOSIS — K219 Gastro-esophageal reflux disease without esophagitis: Secondary | ICD-10-CM | POA: Insufficient documentation

## 2012-08-27 DIAGNOSIS — Z8781 Personal history of (healed) traumatic fracture: Secondary | ICD-10-CM | POA: Insufficient documentation

## 2012-08-27 DIAGNOSIS — I2510 Atherosclerotic heart disease of native coronary artery without angina pectoris: Secondary | ICD-10-CM | POA: Insufficient documentation

## 2012-08-27 DIAGNOSIS — Z87448 Personal history of other diseases of urinary system: Secondary | ICD-10-CM | POA: Insufficient documentation

## 2012-08-27 DIAGNOSIS — Z79899 Other long term (current) drug therapy: Secondary | ICD-10-CM | POA: Insufficient documentation

## 2012-08-27 DIAGNOSIS — R339 Retention of urine, unspecified: Secondary | ICD-10-CM | POA: Insufficient documentation

## 2012-08-27 DIAGNOSIS — Z8739 Personal history of other diseases of the musculoskeletal system and connective tissue: Secondary | ICD-10-CM | POA: Insufficient documentation

## 2012-08-27 DIAGNOSIS — N182 Chronic kidney disease, stage 2 (mild): Secondary | ICD-10-CM | POA: Insufficient documentation

## 2012-08-27 DIAGNOSIS — R141 Gas pain: Secondary | ICD-10-CM | POA: Insufficient documentation

## 2012-08-27 DIAGNOSIS — Z86718 Personal history of other venous thrombosis and embolism: Secondary | ICD-10-CM | POA: Insufficient documentation

## 2012-08-27 DIAGNOSIS — E785 Hyperlipidemia, unspecified: Secondary | ICD-10-CM | POA: Insufficient documentation

## 2012-08-27 DIAGNOSIS — F17200 Nicotine dependence, unspecified, uncomplicated: Secondary | ICD-10-CM | POA: Insufficient documentation

## 2012-08-27 DIAGNOSIS — E1129 Type 2 diabetes mellitus with other diabetic kidney complication: Secondary | ICD-10-CM | POA: Insufficient documentation

## 2012-08-27 LAB — URINALYSIS, MICROSCOPIC ONLY
Glucose, UA: NEGATIVE mg/dL
Hgb urine dipstick: NEGATIVE
Protein, ur: NEGATIVE mg/dL
Specific Gravity, Urine: 1.011 (ref 1.005–1.030)
pH: 6 (ref 5.0–8.0)

## 2012-08-27 LAB — POCT I-STAT, CHEM 8
Chloride: 99 mEq/L (ref 96–112)
HCT: 44 % (ref 39.0–52.0)
Potassium: 3 mEq/L — ABNORMAL LOW (ref 3.5–5.1)
Sodium: 140 mEq/L (ref 135–145)

## 2012-08-27 MED ORDER — HYDROCODONE-ACETAMINOPHEN 5-325 MG PO TABS: 1.0000 | ORAL_TABLET | ORAL | Status: DC | PRN

## 2012-08-27 MED ORDER — HYDROCODONE-ACETAMINOPHEN 5-325 MG PO TABS
2.0000 | ORAL_TABLET | Freq: Once | ORAL | Status: AC
  Administered 2012-08-27: 2 via ORAL
  Filled 2012-08-27: qty 2

## 2012-08-27 MED ORDER — LIDOCAINE HCL 2 % EX GEL
Freq: Once | CUTANEOUS | Status: DC
  Filled 2012-08-27 (×2): qty 20

## 2012-08-27 NOTE — ED Notes (Signed)
Bladder scanned >931mL noted.

## 2012-08-27 NOTE — ED Notes (Signed)
Pt placed on 2L nasal cannula sats varied from 88-96%/RA

## 2012-08-27 NOTE — ED Notes (Signed)
Leg bag placed on patient . Pt taught how to empty bag and care for bag, pt verbalized understanding

## 2012-08-27 NOTE — ED Notes (Signed)
Pt bladder scanned noted. Pt requesting pain medications. wentz MD made aware.

## 2012-08-27 NOTE — ED Provider Notes (Signed)
History     CSN: [id number]  Arrival date & time 08/27/12  0908   First MD Initiated Contact with Patient 08/27/12 [phone]      Chief Complaint  Patient presents with  . Groin Pain    (Consider location/radiation/quality/duration/timing/severity/associated sxs/prior treatment) HPI Comments: SKANDA WORLDS is a 67 y.o. male who presents for evaluation of left lower abdomen and groin pain. The pain is sharp and severe. It reminds him of pain he had when he had a clot in his left leg. He denies nausea or vomiting. He did not eat breakfast today. He denies recent constipation, change in urine habits, dysuria, urinary frequency, weakness, or dizziness. He is ambulatory today and came by private vehicle, for evaluation. There are no known modifying factors.  Patient is a 67 y.o. male presenting with groin pain. The history is provided by the patient.  Groin Pain    Past Medical History  Diagnosis Date  . Venous thromboembolism     DVTs in 2004, 2005, 2007, 8/10, 8/11. IVC filter 2005. Off coumadin 04/2011 secondary to rectal bleeding  . 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 Chronic venous insufficiency   . DM2 (diabetes mellitus, type 2)   . GERD (gastroesophageal reflux disease)   . BPH (benign prostatic hypertrophy)   . Low back pain   . HTN (hypertension)   . HLD (hyperlipidemia)   . NICM (nonischemic cardiomyopathy) 1. 9/11  2. 2/12    1. Echo septal and apical akinesis, dilated LV, EF 45%, RV normal size and systolic function, EF 43% on Myoview  2. Admitted with decompensated CHF, echo EF 30-35% with diffuse hypokinesis but akinesis of mid to apical anteroseptal wall and apex, moderate LVH, mild MR, grade I diastolic dysfunction, SPEP/UP negative and HIV negative. Most recent EF 10-15% 04/2011  . Smoking   . CAD (coronary artery disease) 1. 2007  2. 10/11    1. Left heart cath with 40-50% stenosis in small RCA EF 50%  2. Lexiscan myoview EF 43%, global  hypokinesis, possible small area of apical ischemia; LHC no angiographic CAD, no LV-gram done due to CKD   2. No angiographic CAD by cath 02/2010  . CKD (chronic kidney disease) stage 2, GFR 60-89 ml/min as of 02/2011  . Headache   . Diabetes mellitus   . Blood transfusion   . Anemia   . Syncope     1. in setting of presumed orthostatic Hypotn 09/2011  . Right fibular fracture     1. 09/2011 - resulting from fall/syncope  . Closed right ankle fracture 10/10/2011    Past Surgical History  Procedure Laterality Date  . Cardiac catheterization    . Appendectomy    . Cholecystectomy    . Colonoscopy  05/01/2011    Procedure: COLONOSCOPY;  Surgeon: Louis Meckel, MD;  Location: [facility] ENDOSCOPY;  Service: Endoscopy;  Laterality: N/A;  . Orif ankle fracture  10/10/2011    Procedure: OPEN REDUCTION INTERNAL FIXATION (ORIF) ANKLE FRACTURE;  Surgeon: Eulas Post, MD;  Location: [facility] OR;  Service: Orthopedics;  Laterality: Right;  Right  Ankle Fracture ORIF    Family History  Problem Relation Age of Onset  . Coronary artery disease Neg Hx     Premature    History  Substance Use Topics  . Smoking status: Current Some Day Smoker -- 1.00 packs/day for 40 years    Types: Cigarettes    Last Attempt to Quit:  06/30/2011  . Smokeless tobacco: Never Used  . Alcohol Use: No      Review of Systems  All other systems reviewed and are negative.    Allergies  Review of patient's allergies indicates no known allergies.  Home Medications   Current Outpatient Rx  Name  Route  Sig  Dispense  Refill  . acetaminophen (TYLENOL) 325 MG tablet   Oral   Take 650 mg by mouth every 6 (six) hours as needed for pain.         Marland Kitchen atorvastatin (LIPITOR) 20 MG tablet   Oral   Take 20 mg by mouth every morning.          . digoxin (LANOXIN) 0.125 MG tablet   Oral   Take 1 tablet (0.125 mg total) by mouth daily.   90 tablet   1     Patient needs an appointment to see cardiologist A ...   .  eplerenone (INSPRA) 25 MG tablet   Oral   Take 1 tablet (25 mg total) by mouth daily.         . fluticasone (CUTIVATE) 0.005 % ointment   Topical   Apply 1 application topically 2 (two) times daily. For affected areas         . glipiZIDE (GLUCOTROL XL) 5 MG 24 hr tablet   Oral   Take 5 mg by mouth daily.          . meclizine (ANTIVERT) 25 MG tablet   Oral   Take 25 mg by mouth 3 (three) times daily as needed. For vertigo         . metolazone (ZAROXOLYN) 2.5 MG tablet   Oral   Take 1 tablet (2.5 mg total) by mouth every Saturday at 6 PM.   10 tablet   3   . metoprolol succinate (TOPROL-XL) 50 MG 24 hr tablet   Oral   Take 50 mg by mouth daily. Take with or immediately following a meal.         . naproxen sodium (ANAPROX) 220 MG tablet   Oral   Take 440 mg by mouth 3 (three) times daily with meals.         . pantoprazole (PROTONIX) 40 MG tablet   Oral   Take 1 tablet (40 mg total) by mouth daily.   30 tablet   4   . Potassium Chloride ER 20 MEQ TBCR   Oral   Take 40 mEq by mouth 2 (two) times daily.          . Tamsulosin HCl (FLOMAX) 0.4 MG CAPS   Oral   Take 1 capsule (0.4 mg total) by mouth at bedtime.   90 capsule   1   . torsemide (DEMADEX) 20 MG tablet   Oral   Take 1 tablet (20 mg total) by mouth daily.   90 tablet   1     Patient needs an appointment to see cardiologist A ...   . traMADol (ULTRAM) 50 MG tablet   Oral   Take 50 mg by mouth every 6 (six) hours as needed. For pain         . HYDROcodone-acetaminophen (NORCO) 5-325 MG per tablet   Oral   Take 1 tablet by mouth every 4 (four) hours as needed for pain.   20 tablet   0     BP 107/90  Pulse 116  Temp(Src) 97.6 F (36.4 C) (Oral)  SpO2 95%  Physical Exam  Nursing  note and vitals reviewed. Constitutional: He is oriented to person, place, and time. He appears well-developed. No distress.  Elderly, frail. He appears older, than stated age.  HENT:  Head:  Normocephalic and atraumatic.  Right Ear: External ear normal.  Left Ear: External ear normal.  Eyes: Conjunctivae and EOM are normal. Pupils are equal, round, and reactive to light.  Neck: Normal range of motion and phonation normal. Neck supple.  Cardiovascular: Normal rate, regular rhythm, normal heart sounds and intact distal pulses.   Pulmonary/Chest: Effort normal and breath sounds normal. He exhibits no bony tenderness.  Abdominal: Soft. Normal appearance. He exhibits distension. There is tenderness. There is no rebound and no guarding.  Possible palpable bladder mass at the umbilicus, with mild tenderness. No groin hernia or mass.  Genitourinary: Penis normal.  Normal scrotum and testicles  Musculoskeletal: Normal range of motion. He exhibits no edema and no tenderness.  Neurological: He is alert and oriented to person, place, and time. He has normal strength. No cranial nerve deficit or sensory deficit. He exhibits normal muscle tone. Coordination normal.  Skin: Skin is warm, dry and intact.  Psychiatric: He has a normal mood and affect. His behavior is normal. Judgment and thought content normal.    ED Course  Procedures (including critical care time) Medications  lidocaine (XYLOCAINE) 2 % jelly (not administered)  HYDROcodone-acetaminophen (NORCO/VICODIN) 5-325 MG per tablet 2 tablet (2 tablets Oral Given 08/27/12 1229)     Bladder scan, greater than 1000 cc urine.  Foley catheter placed with immediate recovery of 1300 cc of clear yellow urine.   Reevaluation: 14:10 patient feels better. Repeat vital signs, are reassuring.     Labs Reviewed  URINALYSIS, MICROSCOPIC ONLY - Abnormal; Notable for the following:    APPearance CLOUDY (*)    Leukocytes, UA LARGE (*)    Bacteria, UA MANY (*)    Squamous Epithelial / LPF FEW (*)    All other components within normal limits  POCT I-STAT, CHEM 8 - Abnormal; Notable for the following:    Potassium 3.0 (*)    BUN 30 (*)     Creatinine, Ser 1.90 (*)    Glucose, Bld 130 (*)    Calcium, Ion 1.10 (*)    All other components within normal limits  URINE CULTURE   Nursing Notes Reviewed/ Care Coordinated, and agree without changes. Applicable Imaging Reviewed.  Interpretation of Laboratory Data incorporated into ED treatment   1. Acute urinary retention       MDM  Acute urinary tract obstruction with retention. Cause unclear. No acute renal failure. Doubt metabolic instability, serious bacterial infection or impending vascular collapse; the patient is stable for discharge.    Plan: Home Medications- Norco, for tolerance of foley; Home Treatments- rest; Recommended follow up- Urology 1 week and prn    Flint Melter, MD 08/27/12 1417

## 2012-08-27 NOTE — ED Notes (Signed)
Pt woke up at 0230 with severe left groin pain rated 10/10, pain does not radiate. Nothing make the pain better. Pain feels the same as when he had a blood clot 6 years ago. Pt was on blood thinners last time he had a clot but stopped taking them 2 years ago due to problems with bleeding.

## 2012-08-29 LAB — URINE CULTURE

## 2012-08-30 ENCOUNTER — Telehealth (HOSPITAL_COMMUNITY): Payer: Self-pay | Admitting: Emergency Medicine

## 2012-08-30 NOTE — ED Notes (Signed)
Patient has +Urine culture. °

## 2012-08-30 NOTE — ED Notes (Signed)
+   Urine Chart sent to EDP office for review. 

## 2012-08-31 ENCOUNTER — Encounter (HOSPITAL_COMMUNITY): Payer: Self-pay | Admitting: Unknown Physician Specialty

## 2012-08-31 ENCOUNTER — Emergency Department (HOSPITAL_COMMUNITY)
Admission: EM | Admit: 2012-08-31 | Discharge: 2012-08-31 | Disposition: A | Discharge status: Home / Self Care | Payer: PRIVATE HEALTH INSURANCE | Attending: Emergency Medicine | Admitting: Emergency Medicine

## 2012-08-31 DIAGNOSIS — Z86718 Personal history of other venous thrombosis and embolism: Secondary | ICD-10-CM | POA: Insufficient documentation

## 2012-08-31 DIAGNOSIS — Z8679 Personal history of other diseases of the circulatory system: Secondary | ICD-10-CM | POA: Insufficient documentation

## 2012-08-31 DIAGNOSIS — Z466 Encounter for fitting and adjustment of urinary device: Secondary | ICD-10-CM

## 2012-08-31 DIAGNOSIS — K219 Gastro-esophageal reflux disease without esophagitis: Secondary | ICD-10-CM | POA: Insufficient documentation

## 2012-08-31 DIAGNOSIS — N400 Enlarged prostate without lower urinary tract symptoms: Secondary | ICD-10-CM | POA: Insufficient documentation

## 2012-08-31 DIAGNOSIS — F17200 Nicotine dependence, unspecified, uncomplicated: Secondary | ICD-10-CM | POA: Insufficient documentation

## 2012-08-31 DIAGNOSIS — N509 Disorder of male genital organs, unspecified: Secondary | ICD-10-CM | POA: Insufficient documentation

## 2012-08-31 DIAGNOSIS — Z8781 Personal history of (healed) traumatic fracture: Secondary | ICD-10-CM | POA: Insufficient documentation

## 2012-08-31 DIAGNOSIS — E119 Type 2 diabetes mellitus without complications: Secondary | ICD-10-CM | POA: Insufficient documentation

## 2012-08-31 DIAGNOSIS — I2510 Atherosclerotic heart disease of native coronary artery without angina pectoris: Secondary | ICD-10-CM | POA: Insufficient documentation

## 2012-08-31 DIAGNOSIS — N182 Chronic kidney disease, stage 2 (mild): Secondary | ICD-10-CM | POA: Insufficient documentation

## 2012-08-31 DIAGNOSIS — Z8774 Personal history of (corrected) congenital malformations of heart and circulatory system: Secondary | ICD-10-CM | POA: Insufficient documentation

## 2012-08-31 DIAGNOSIS — I129 Hypertensive chronic kidney disease with stage 1 through stage 4 chronic kidney disease, or unspecified chronic kidney disease: Secondary | ICD-10-CM | POA: Insufficient documentation

## 2012-08-31 DIAGNOSIS — E785 Hyperlipidemia, unspecified: Secondary | ICD-10-CM | POA: Insufficient documentation

## 2012-08-31 DIAGNOSIS — Z79899 Other long term (current) drug therapy: Secondary | ICD-10-CM | POA: Insufficient documentation

## 2012-08-31 DIAGNOSIS — R300 Dysuria: Secondary | ICD-10-CM | POA: Insufficient documentation

## 2012-08-31 DIAGNOSIS — Z862 Personal history of diseases of the blood and blood-forming organs and certain disorders involving the immune mechanism: Secondary | ICD-10-CM | POA: Insufficient documentation

## 2012-08-31 DIAGNOSIS — Z9861 Coronary angioplasty status: Secondary | ICD-10-CM | POA: Insufficient documentation

## 2012-08-31 NOTE — ED Notes (Signed)
dc'ed , pt up and working around in the unit

## 2012-08-31 NOTE — ED Provider Notes (Signed)
History     CSN: [id number]  Arrival date & time 08/31/12  1737   First MD Initiated Contact with Patient 08/31/12 1938      Chief Complaint  Patient presents with  . Dysuria    (Consider location/radiation/quality/duration/timing/severity/associated sxs/prior treatment) HPI Comments: Patient had a foley placed 5 days ago for retention, has an appointment with urology tomorrow but can not stand the "foley any longer" due to burning sensation States that he was urinating but the stream was less.   He wants the foley out "now"   Patient is a 67 y.o. male presenting with dysuria. The history is provided by the patient.  Dysuria  This is a new problem. The quality of the pain is described as burning. Pertinent negatives include no chills, no nausea and no vomiting.    Past Medical History  Diagnosis Date  . Venous thromboembolism     DVTs in 2004, 2005, 2007, 8/10, 8/11. IVC filter 2005. Off coumadin 04/2011 secondary to rectal bleeding  . 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 Chronic venous insufficiency   . DM2 (diabetes mellitus, type 2)   . GERD (gastroesophageal reflux disease)   . BPH (benign prostatic hypertrophy)   . Low back pain   . HTN (hypertension)   . HLD (hyperlipidemia)   . NICM (nonischemic cardiomyopathy) 1. 9/11  2. 2/12    1. Echo septal and apical akinesis, dilated LV, EF 45%, RV normal size and systolic function, EF 43% on Myoview  2. Admitted with decompensated CHF, echo EF 30-35% with diffuse hypokinesis but akinesis of mid to apical anteroseptal wall and apex, moderate LVH, mild MR, grade I diastolic dysfunction, SPEP/UP negative and HIV negative. Most recent EF 10-15% 04/2011  . Smoking   . CAD (coronary artery disease) 1. 2007  2. 10/11    1. Left heart cath with 40-50% stenosis in small RCA EF 50%  2. Lexiscan myoview EF 43%, global hypokinesis, possible small area of apical ischemia; LHC no angiographic CAD, no LV-gram done due  to CKD   2. No angiographic CAD by cath 02/2010  . CKD (chronic kidney disease) stage 2, GFR 60-89 ml/min as of 02/2011  . Headache   . Diabetes mellitus   . Blood transfusion   . Anemia   . Syncope     1. in setting of presumed orthostatic Hypotn 09/2011  . Right fibular fracture     1. 09/2011 - resulting from fall/syncope  . Closed right ankle fracture 10/10/2011    Past Surgical History  Procedure Laterality Date  . Cardiac catheterization    . Appendectomy    . Cholecystectomy    . Colonoscopy  05/01/2011    Procedure: COLONOSCOPY;  Surgeon: Louis Meckel, MD;  Location: [facility] ENDOSCOPY;  Service: Endoscopy;  Laterality: N/A;  . Orif ankle fracture  10/10/2011    Procedure: OPEN REDUCTION INTERNAL FIXATION (ORIF) ANKLE FRACTURE;  Surgeon: Eulas Post, MD;  Location: [facility] OR;  Service: Orthopedics;  Laterality: Right;  Right  Ankle Fracture ORIF    Family History  Problem Relation Age of Onset  . Coronary artery disease Neg Hx     Premature    History  Substance Use Topics  . Smoking status: Current Some Day Smoker -- 1.00 packs/day for 40 years    Types: Cigarettes    Last Attempt to Quit: 06/30/2011  . Smokeless tobacco: Never Used  . Alcohol Use: No  Review of Systems  Constitutional: Negative for fever and chills.       Very demanding and uncooperative  Gastrointestinal: Negative for nausea, vomiting and abdominal pain.  Genitourinary: Positive for dysuria and penile pain.  All other systems reviewed and are negative.    Allergies  Review of patient's allergies indicates no known allergies.  Home Medications   Current Outpatient Rx  Name  Route  Sig  Dispense  Refill  . acetaminophen (TYLENOL) 325 MG tablet   Oral   Take 650 mg by mouth every 6 (six) hours as needed for pain.         Marland Kitchen atorvastatin (LIPITOR) 20 MG tablet   Oral   Take 20 mg by mouth every morning.          . digoxin (LANOXIN) 0.125 MG tablet   Oral   Take 1 tablet (0.125  mg total) by mouth daily.   90 tablet   1     Patient needs an appointment to see cardiologist A ...   . eplerenone (INSPRA) 25 MG tablet   Oral   Take 1 tablet (25 mg total) by mouth daily.         . fluticasone (CUTIVATE) 0.005 % ointment   Topical   Apply 1 application topically 2 (two) times daily. For affected areas         . glipiZIDE (GLUCOTROL XL) 5 MG 24 hr tablet   Oral   Take 5 mg by mouth daily.          Marland Kitchen HYDROcodone-acetaminophen (NORCO/VICODIN) 5-325 MG per tablet   Oral   Take 1 tablet by mouth every 4 (four) hours as needed for pain.         . meclizine (ANTIVERT) 25 MG tablet   Oral   Take 25 mg by mouth 3 (three) times daily as needed. For vertigo         . metolazone (ZAROXOLYN) 2.5 MG tablet   Oral   Take 1 tablet (2.5 mg total) by mouth every Saturday at 6 PM.   10 tablet   3   . metoprolol succinate (TOPROL-XL) 50 MG 24 hr tablet   Oral   Take 50 mg by mouth daily. Take with or immediately following a meal.         . naproxen sodium (ANAPROX) 220 MG tablet   Oral   Take 440 mg by mouth 3 (three) times daily with meals.         . pantoprazole (PROTONIX) 40 MG tablet   Oral   Take 1 tablet (40 mg total) by mouth daily.   30 tablet   4   . potassium chloride SA (K-DUR,KLOR-CON) 20 MEQ tablet   Oral   Take 40 mEq by mouth 2 (two) times daily.         . Tamsulosin HCl (FLOMAX) 0.4 MG CAPS   Oral   Take 1 capsule (0.4 mg total) by mouth at bedtime.   90 capsule   1   . torsemide (DEMADEX) 20 MG tablet   Oral   Take 1 tablet (20 mg total) by mouth daily.   90 tablet   1     Patient needs an appointment to see cardiologist A ...   . traMADol (ULTRAM) 50 MG tablet   Oral   Take 50 mg by mouth every 6 (six) hours as needed. For pain           BP 129/85  Pulse  123  Temp(Src) 97.7 F (36.5 C) (Oral)  Resp 20  SpO2 99%  Physical Exam  Nursing note and vitals reviewed. Constitutional: He is oriented to person,  place, and time. He appears well-developed and well-nourished.  Eyes: Pupils are equal, round, and reactive to light.  Neck: Normal range of motion.  Cardiovascular: Tachycardia present.   Pulmonary/Chest: Effort normal.  Abdominal: Soft. Bowel sounds are normal. He exhibits no distension. There is no tenderness.  Neurological: He is alert and oriented to person, place, and time.  aggitiated  Skin: Skin is warm and dry.    ED Course  Procedures (including critical care time)  Labs Reviewed - No data to display No results found.   1. Encounter for Foley catheter removal       MDM  Will have foley removed and monitor urination          Arman Filter, NP 08/31/12 2143

## 2012-08-31 NOTE — ED Notes (Signed)
Patient arrived via [facility] with foley catheter pain. Patient states the foley was put in here at [facility] ED on Friday for urinary retention. Patient has an appt tomorrow with the doctor but said he could not wait until then to be seen due to the pain. He denies any retention or blood in his urine.

## 2012-09-03 NOTE — ED Provider Notes (Signed)
Medical screening examination/treatment/procedure(s) were performed by non-physician practitioner and as supervising physician I was immediately available for consultation/collaboration.   Hurman Horn, MD 09/03/12 [phone]

## 2012-09-05 ENCOUNTER — Telehealth (HOSPITAL_COMMUNITY): Payer: Self-pay | Admitting: Emergency Medicine

## 2012-09-08 ENCOUNTER — Ambulatory Visit (HOSPITAL_COMMUNITY)
Admission: RE | Admit: 2012-09-08 | Discharge: 2012-09-08 | Disposition: A | Discharge status: Home / Self Care | Payer: PRIVATE HEALTH INSURANCE | Source: Ambulatory Visit | Attending: Internal Medicine | Admitting: Internal Medicine

## 2012-09-08 ENCOUNTER — Encounter (HOSPITAL_COMMUNITY): Payer: Self-pay

## 2012-09-08 VITALS — HR 119 | Wt 184.0 lb

## 2012-09-08 DIAGNOSIS — I509 Heart failure, unspecified: Secondary | ICD-10-CM | POA: Insufficient documentation

## 2012-09-08 DIAGNOSIS — R339 Retention of urine, unspecified: Secondary | ICD-10-CM

## 2012-09-08 DIAGNOSIS — I5022 Chronic systolic (congestive) heart failure: Secondary | ICD-10-CM

## 2012-09-08 DIAGNOSIS — I10 Essential (primary) hypertension: Secondary | ICD-10-CM | POA: Insufficient documentation

## 2012-09-08 NOTE — Progress Notes (Signed)
Patient ID: [name], male   DOB: 31-Dec-1945, 67 y.o.   MRN: [id number] PCP: Dr Pecola Leisure  HPI:  [name] is a 67 year old male with chronic systolic CHF secondary to NICM echo EF 30-35%, HTN, anemia, chronic renal insufficiency Cr 1.31,  UGI bleed,  duodenal AVMs,  rectal bleeding 04/2011,  DVTs (2004, 2005, 2007, 2010, 2011) however coumadin on hold due to GI bleed, IVC filter 2005, GERD, BPD, and chronic low back .  09/2011 S/P ORIF L ankle. Spironolactone switched to eplerenone due to gynecomastia.  Discharged 06/25/11  Weight 159 pounds.  RHC 2/13 RA = 4  RV = 32/6 (7)  PA = 45/15 (24)  PCW = 7  Fick cardiac output/index = 5.2/2.7  Thermo = 4.0/2.1  PVR = 3.4 Woods  FA sat = 94%  PA sat = 56%, 57%  Noninvasive BP : 125/92 (72)  Calculated SVR = 1040   08/13/11 CPX  RER 1.16 Peak VO2 18.6 ml/kg/min Predicted 61.4% VE/VCO2 slope 42.8 08/23/2011 EF 30-35%  Admitted from HF clinic 05/30/2012 with ~40-50 volume overload. Diuresed with Milrinone and Lasix. Diuresed 41 pounds. Milrinone and lasix gtt weaned off. CO-OX dropped 56>50>43. Beta blocker was stopped due to low output. He was offered home Milrinone however he declined.  Discharge weight 175 pounds.   Presented to [facility] ED 07/08/12 with increased dyspnea and increased heart rate. CE negative. Pro BNP 3300. Received baby aspirin and nitroglycerin.   Presented to [facility] 08/31/12 complaining of dysuria. Urinary retention noted. He followed up with Dr Retta Diones with recommendations for I/O catheter and given Cipro.    He returns for follow up today. Complaining of hematuria. Denies PND/Orthopnea. Exertional dyspnea noted. Chronic 2 pillow orthopnea.  Weight ranges 180-185 pounds.  Has aide several hours a day.  Says he did not take torsemide last night due to hematuria. He is calling his urologist today due to hematuria. Not currently performing I/O caths.    ROS: All systems negative except as listed in HPI, PMH and Problem List.  Past  Medical History  Diagnosis Date  . Venous thromboembolism     DVTs in 2004, 2005, 2007, 8/10, 8/11. IVC filter 2005. Off coumadin 04/2011 secondary to rectal bleeding  . 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 Chronic venous insufficiency   . DM2 (diabetes mellitus, type 2)   . GERD (gastroesophageal reflux disease)   . BPH (benign prostatic hypertrophy)   . Low back pain   . HTN (hypertension)   . HLD (hyperlipidemia)   . NICM (nonischemic cardiomyopathy) 1. 9/11  2. 2/12    1. Echo septal and apical akinesis, dilated LV, EF 45%, RV normal size and systolic function, EF 43% on Myoview  2. Admitted with decompensated CHF, echo EF 30-35% with diffuse hypokinesis but akinesis of mid to apical anteroseptal wall and apex, moderate LVH, mild MR, grade I diastolic dysfunction, SPEP/UP negative and HIV negative. Most recent EF 10-15% 04/2011  . Smoking   . CAD (coronary artery disease) 1. 2007  2. 10/11    1. Left heart cath with 40-50% stenosis in small RCA EF 50%  2. Lexiscan myoview EF 43%, global hypokinesis, possible small area of apical ischemia; LHC no angiographic CAD, no LV-gram done due to CKD   2. No angiographic CAD by cath 02/2010  . CKD (chronic kidney disease) stage 2, GFR 60-89 ml/min as of 02/2011  . Headache   . Diabetes mellitus   .  Blood transfusion   . Anemia   . Syncope     1. in setting of presumed orthostatic Hypotn 09/2011  . Right fibular fracture     1. 09/2011 - resulting from fall/syncope  . Closed right ankle fracture 10/10/2011    Current Outpatient Prescriptions  Medication Sig Dispense Refill  . acetaminophen (TYLENOL) 325 MG tablet Take 650 mg by mouth every 6 (six) hours as needed for pain.      Marland Kitchen atorvastatin (LIPITOR) 20 MG tablet Take 20 mg by mouth every morning.       . digoxin (LANOXIN) 0.125 MG tablet Take 1 tablet (0.125 mg total) by mouth daily.  90 tablet  1  . eplerenone (INSPRA) 25 MG tablet Take 1 tablet (25 mg total) by  mouth daily.      . fluticasone (CUTIVATE) 0.005 % ointment Apply 1 application topically 2 (two) times daily. For affected areas      . glipiZIDE (GLUCOTROL XL) 5 MG 24 hr tablet Take 5 mg by mouth daily.       Marland Kitchen HYDROcodone-acetaminophen (NORCO/VICODIN) 5-325 MG per tablet Take 1 tablet by mouth every 4 (four) hours as needed for pain.      . meclizine (ANTIVERT) 25 MG tablet Take 25 mg by mouth 3 (three) times daily as needed. For vertigo      . metolazone (ZAROXOLYN) 2.5 MG tablet Take 1 tablet (2.5 mg total) by mouth every Saturday at 6 PM.  10 tablet  3  . metoprolol succinate (TOPROL-XL) 50 MG 24 hr tablet Take 50 mg by mouth daily. Take with or immediately following a meal.      . naproxen sodium (ANAPROX) 220 MG tablet Take 440 mg by mouth 3 (three) times daily with meals.      . pantoprazole (PROTONIX) 40 MG tablet Take 1 tablet (40 mg total) by mouth daily.  30 tablet  4  . potassium chloride SA (K-DUR,KLOR-CON) 20 MEQ tablet Take 40 mEq by mouth 2 (two) times daily.      . Tamsulosin HCl (FLOMAX) 0.4 MG CAPS Take 1 capsule (0.4 mg total) by mouth at bedtime.  90 capsule  1  . torsemide (DEMADEX) 20 MG tablet Take 1 tablet (20 mg total) by mouth daily.  90 tablet  1  . traMADol (ULTRAM) 50 MG tablet Take 50 mg by mouth every 6 (six) hours as needed. For pain       No current facility-administered medications for this encounter.     PHYSICAL EXAM: Filed Vitals:   09/08/12 1158  Pulse: 119  Weight: 184 lb (83.462 kg)  SpO2: 98%   General: Comfortable. No acute distress.  HEENT: normal except for poor dentition Neck: supple. JVP 9-10 Carotids 2+ bilaterally; no bruits. No lymphadenopathy or thryomegaly appreciated. Cor: PMI displaced laterally. Tachycardic regular rate and rhythm. +S3 Lungs: clear Abdomen: Non tender. Good bowel sounds. + distention Extremities: no cyanosis, clubbing, rash, tr edema  RLE and LLE 1+  edema Neuro: alert & orientedx3, cranial nerves grossly  intact. Moves all 4 extremities w/o difficulty. Affect pleasant.    ASSESSMENT & PLAN:

## 2012-09-08 NOTE — Assessment & Plan Note (Signed)
NYHA III B. Volume status elevated today because he has not take diuretics in the last 2 days. He stopped torsemide due to hematuria. BP soft will not titrate medications. I have encouraged him to resume Torsemide as soon as possible. He is instructed to call Dr Retta Diones regarding hematuria. Follow up in 2 weeks to reassess volume status.

## 2012-09-08 NOTE — Patient Instructions (Addendum)
Follow up in 2 weeks  Do the following things EVERYDAY: 1) Weigh yourself in the morning before breakfast. Write it down and keep it in a log. 2) Take your medicines as prescribed 3) Eat low salt foods--Limit salt (sodium) to 2000 mg per day.  4) Stay as active as you can everyday 5) Limit all fluids for the day to less than 2 liters 

## 2012-09-08 NOTE — Assessment & Plan Note (Signed)
I have instructed him to contact Dr Retta Diones regarding urinary retention. He has stopped performing I/O caths.

## 2012-09-11 ENCOUNTER — Emergency Department (HOSPITAL_COMMUNITY)
Admission: EM | Admit: 2012-09-11 | Discharge: 2012-09-12 | Disposition: A | Discharge status: Home / Self Care | Payer: PRIVATE HEALTH INSURANCE | Attending: Emergency Medicine | Admitting: Emergency Medicine

## 2012-09-11 DIAGNOSIS — K219 Gastro-esophageal reflux disease without esophagitis: Secondary | ICD-10-CM | POA: Insufficient documentation

## 2012-09-11 DIAGNOSIS — Z8781 Personal history of (healed) traumatic fracture: Secondary | ICD-10-CM | POA: Insufficient documentation

## 2012-09-11 DIAGNOSIS — Z8679 Personal history of other diseases of the circulatory system: Secondary | ICD-10-CM | POA: Insufficient documentation

## 2012-09-11 DIAGNOSIS — Z862 Personal history of diseases of the blood and blood-forming organs and certain disorders involving the immune mechanism: Secondary | ICD-10-CM | POA: Insufficient documentation

## 2012-09-11 DIAGNOSIS — N182 Chronic kidney disease, stage 2 (mild): Secondary | ICD-10-CM | POA: Insufficient documentation

## 2012-09-11 DIAGNOSIS — E119 Type 2 diabetes mellitus without complications: Secondary | ICD-10-CM | POA: Insufficient documentation

## 2012-09-11 DIAGNOSIS — I2510 Atherosclerotic heart disease of native coronary artery without angina pectoris: Secondary | ICD-10-CM | POA: Insufficient documentation

## 2012-09-11 DIAGNOSIS — R339 Retention of urine, unspecified: Secondary | ICD-10-CM | POA: Insufficient documentation

## 2012-09-11 DIAGNOSIS — I129 Hypertensive chronic kidney disease with stage 1 through stage 4 chronic kidney disease, or unspecified chronic kidney disease: Secondary | ICD-10-CM | POA: Insufficient documentation

## 2012-09-11 DIAGNOSIS — N400 Enlarged prostate without lower urinary tract symptoms: Secondary | ICD-10-CM | POA: Insufficient documentation

## 2012-09-11 DIAGNOSIS — Z79899 Other long term (current) drug therapy: Secondary | ICD-10-CM | POA: Insufficient documentation

## 2012-09-11 DIAGNOSIS — F17200 Nicotine dependence, unspecified, uncomplicated: Secondary | ICD-10-CM | POA: Insufficient documentation

## 2012-09-11 DIAGNOSIS — E785 Hyperlipidemia, unspecified: Secondary | ICD-10-CM | POA: Insufficient documentation

## 2012-09-11 LAB — CBC WITH DIFFERENTIAL/PLATELET
Basophils Absolute: 0 10*3/uL (ref 0.0–0.1)
Basophils Relative: 1 % (ref 0–1)
HCT: 36.5 % — ABNORMAL LOW (ref 39.0–52.0)
Lymphocytes Relative: 21 % (ref 12–46)
MCHC: 33.4 g/dL (ref 30.0–36.0)
Monocytes Absolute: 0.3 10*3/uL (ref 0.1–1.0)
Neutro Abs: 2.5 10*3/uL (ref 1.7–7.7)
Platelets: 268 10*3/uL (ref 150–400)
RDW: 18.2 % — ABNORMAL HIGH (ref 11.5–15.5)
WBC: 3.7 10*3/uL — ABNORMAL LOW (ref 4.0–10.5)

## 2012-09-11 LAB — COMPREHENSIVE METABOLIC PANEL
ALT: 16 U/L (ref 0–53)
AST: 28 U/L (ref 0–37)
Albumin: 3.2 g/dL — ABNORMAL LOW (ref 3.5–5.2)
Calcium: 9.3 mg/dL (ref 8.4–10.5)
Chloride: 97 mEq/L (ref 96–112)
Creatinine, Ser: 2.04 mg/dL — ABNORMAL HIGH (ref 0.50–1.35)
Sodium: 134 mEq/L — ABNORMAL LOW (ref 135–145)
Total Bilirubin: 1.8 mg/dL — ABNORMAL HIGH (ref 0.3–1.2)

## 2012-09-11 LAB — URINE MICROSCOPIC-ADD ON

## 2012-09-11 LAB — URINALYSIS, ROUTINE W REFLEX MICROSCOPIC
Glucose, UA: NEGATIVE mg/dL
Nitrite: NEGATIVE
Specific Gravity, Urine: 1.014 (ref 1.005–1.030)
pH: 5.5 (ref 5.0–8.0)

## 2012-09-11 LAB — LIPASE, BLOOD: Lipase: 93 U/L — ABNORMAL HIGH (ref 11–59)

## 2012-09-11 LAB — TROPONIN I: Troponin I: <0.3 ng/mL (ref <0.30)

## 2012-09-11 MED ORDER — HYDROMORPHONE HCL PF 1 MG/ML IJ SOLN
1.0000 mg | Freq: Once | INTRAMUSCULAR | Status: AC
  Administered 2012-09-11: 1 mg via INTRAVENOUS
  Filled 2012-09-11: qty 1

## 2012-09-11 MED ORDER — ONDANSETRON HCL 4 MG/2ML IJ SOLN
4.0000 mg | Freq: Once | INTRAMUSCULAR | Status: AC
Start: 2012-09-11 — End: 2012-09-11
  Administered 2012-09-11: 4 mg via INTRAVENOUS
  Filled 2012-09-11: qty 2

## 2012-09-11 NOTE — ED Notes (Signed)
Bladder scan showed about urine.

## 2012-09-11 NOTE — ED Notes (Signed)
To ED from via EMS, pt c/o left sided abd pain and swelling X1d with bilat lower leg edema, 98/80, HR120, resp 12, 18g left forearm, sinus tach, CBG 170

## 2012-09-12 NOTE — ED Provider Notes (Signed)
History     CSN: [id number]  Arrival date & time 09/11/12  5784   First MD Initiated Contact with Patient 09/11/12 2245      Chief Complaint  Patient presents with  . Abdominal Pain    (Consider location/radiation/quality/duration/timing/severity/associated sxs/prior treatment) HPI Comments: [name] is a 67 y.o. male who complains of abdominal pain and swelling for one day. He stopped using a self catheter 2 days ago because he noticed blood when he did it. He had a Foley catheter removed about 5 days ago. Since that time, he has been doing self-catheterization. This was at the recommendation, on his urologist. He denies fever, chills, nausea, vomiting, weakness, or dizziness. There are no known modifying factors.  Patient is a 67 y.o. male presenting with abdominal pain. The history is provided by the patient.  Abdominal Pain   Past Medical History  Diagnosis Date  . Venous thromboembolism     DVTs in 2004, 2005, 2007, 8/10, 8/11. IVC filter 2005. Off coumadin 04/2011 secondary to rectal bleeding  . 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 Chronic venous insufficiency   . DM2 (diabetes mellitus, type 2)   . GERD (gastroesophageal reflux disease)   . BPH (benign prostatic hypertrophy)   . Low back pain   . HTN (hypertension)   . HLD (hyperlipidemia)   . NICM (nonischemic cardiomyopathy) 1. 9/11  2. 2/12    1. Echo septal and apical akinesis, dilated LV, EF 45%, RV normal size and systolic function, EF 43% on Myoview  2. Admitted with decompensated CHF, echo EF 30-35% with diffuse hypokinesis but akinesis of mid to apical anteroseptal wall and apex, moderate LVH, mild MR, grade I diastolic dysfunction, SPEP/UP negative and HIV negative. Most recent EF 10-15% 04/2011  . Smoking   . CAD (coronary artery disease) 1. 2007  2. 10/11    1. Left heart cath with 40-50% stenosis in small RCA EF 50%  2. Lexiscan myoview EF 43%, global hypokinesis, possible  small area of apical ischemia; LHC no angiographic CAD, no LV-gram done due to CKD   2. No angiographic CAD by cath 02/2010  . CKD (chronic kidney disease) stage 2, GFR 60-89 ml/min as of 02/2011  . Headache   . Diabetes mellitus   . Blood transfusion   . Anemia   . Syncope     1. in setting of presumed orthostatic Hypotn 09/2011  . Right fibular fracture     1. 09/2011 - resulting from fall/syncope  . Closed right ankle fracture 10/10/2011    Past Surgical History  Procedure Laterality Date  . Cardiac catheterization    . Appendectomy    . Cholecystectomy    . Colonoscopy  05/01/2011    Procedure: COLONOSCOPY;  Surgeon: Louis Meckel, MD;  Location: [facility] ENDOSCOPY;  Service: Endoscopy;  Laterality: N/A;  . Orif ankle fracture  10/10/2011    Procedure: OPEN REDUCTION INTERNAL FIXATION (ORIF) ANKLE FRACTURE;  Surgeon: Eulas Post, MD;  Location: [facility] OR;  Service: Orthopedics;  Laterality: Right;  Right  Ankle Fracture ORIF    Family History  Problem Relation Age of Onset  . Coronary artery disease Neg Hx     Premature    History  Substance Use Topics  . Smoking status: Current Some Day Smoker -- 1.00 packs/day for 40 years    Types: Cigarettes    Last Attempt to Quit: 06/30/2011  . Smokeless tobacco: Never Used  .  Alcohol Use: No      Review of Systems  Gastrointestinal: Positive for abdominal pain.  All other systems reviewed and are negative.    Allergies  Review of patient's allergies indicates no known allergies.  Home Medications   Current Outpatient Rx  Name  Route  Sig  Dispense  Refill  . acetaminophen (TYLENOL) 325 MG tablet   Oral   Take 650 mg by mouth every 6 (six) hours as needed for pain.         Marland Kitchen atorvastatin (LIPITOR) 20 MG tablet   Oral   Take 20 mg by mouth every morning.          . ciprofloxacin (CIPRO) 250 MG tablet   Oral   Take 250 mg by mouth 3 (three) times daily.         . digoxin (LANOXIN) 0.125 MG tablet   Oral   Take  1 tablet (0.125 mg total) by mouth daily.   90 tablet   1     Patient needs an appointment to see cardiologist A ...   . eplerenone (INSPRA) 25 MG tablet   Oral   Take 1 tablet (25 mg total) by mouth daily.         . fluticasone (CUTIVATE) 0.005 % ointment   Topical   Apply 1 application topically 2 (two) times daily. For affected areas         . glipiZIDE (GLUCOTROL XL) 5 MG 24 hr tablet   Oral   Take 5 mg by mouth daily.          Marland Kitchen HYDROcodone-acetaminophen (NORCO/VICODIN) 5-325 MG per tablet   Oral   Take 1 tablet by mouth every 4 (four) hours as needed for pain.         . meclizine (ANTIVERT) 25 MG tablet   Oral   Take 25 mg by mouth 3 (three) times daily as needed. For vertigo         . metolazone (ZAROXOLYN) 2.5 MG tablet   Oral   Take 1 tablet (2.5 mg total) by mouth every Saturday at 6 PM.   10 tablet   3   . metoprolol succinate (TOPROL-XL) 50 MG 24 hr tablet   Oral   Take 50 mg by mouth daily. Take with or immediately following a meal.         . naproxen sodium (ANAPROX) 220 MG tablet   Oral   Take 440 mg by mouth 3 (three) times daily as needed. For pain         . pantoprazole (PROTONIX) 40 MG tablet   Oral   Take 1 tablet (40 mg total) by mouth daily.   30 tablet   4   . potassium chloride SA (K-DUR,KLOR-CON) 20 MEQ tablet   Oral   Take 40 mEq by mouth 2 (two) times daily.         . Tamsulosin HCl (FLOMAX) 0.4 MG CAPS   Oral   Take 1 capsule (0.4 mg total) by mouth at bedtime.   90 capsule   1   . torsemide (DEMADEX) 20 MG tablet   Oral   Take 1 tablet (20 mg total) by mouth daily.   90 tablet   1     Patient needs an appointment to see cardiologist A ...   . traMADol (ULTRAM) 50 MG tablet   Oral   Take 50 mg by mouth every 6 (six) hours as needed. For pain  BP 94/62  Pulse 113  Temp(Src) 98.3 F (36.8 C) (Oral)  Resp 22  SpO2 98%  Physical Exam  Nursing note and vitals reviewed. Constitutional: He  is oriented to person, place, and time. He appears well-developed and well-nourished.  HENT:  Head: Normocephalic and atraumatic.  Right Ear: External ear normal.  Left Ear: External ear normal.  Eyes: Conjunctivae and EOM are normal. Pupils are equal, round, and reactive to light.  Neck: Normal range of motion and phonation normal. Neck supple.  Cardiovascular: Normal rate, regular rhythm, normal heart sounds and intact distal pulses.   Pulmonary/Chest: Effort normal and breath sounds normal. He exhibits no bony tenderness.  Abdominal: Soft. Normal appearance. He exhibits distension. He exhibits no mass. There is tenderness (ssuprapubic, moderate). There is no guarding.  Musculoskeletal: Normal range of motion.  Neurological: He is alert and oriented to person, place, and time. He has normal strength. No cranial nerve deficit or sensory deficit. He exhibits normal muscle tone. Coordination normal.  Skin: Skin is warm, dry and intact.  Psychiatric: He has a normal mood and affect. His behavior is normal. Judgment and thought content normal.    ED Course  Procedures (including critical care time)   Bladder scan was done by nursing showed 680 mL is urine in the bladder.  Foley catheter was offered to the patient. He declined. He wants to do self-catheterization at home. He states he is equipment to do it and prefers it to be done, that the way.      Labs Reviewed  CBC WITH DIFFERENTIAL - Abnormal; Notable for the following:    WBC 3.7 (*)    Hemoglobin 12.2 (*)    HCT 36.5 (*)    MCV 72.9 (*)    MCH 24.4 (*)    RDW 18.2 (*)    All other components within normal limits  COMPREHENSIVE METABOLIC PANEL - Abnormal; Notable for the following:    Sodium 134 (*)    Glucose, Bld 165 (*)    BUN 35 (*)    Creatinine, Ser 2.04 (*)    Albumin 3.2 (*)    Alkaline Phosphatase 140 (*)    Total Bilirubin 1.8 (*)    GFR calc non Af Amer 33 (*)    GFR calc Af Amer 38 (*)    All other  components within normal limits  AMYLASE - Abnormal; Notable for the following:    Amylase 181 (*)    All other components within normal limits  LIPASE, BLOOD - Abnormal; Notable for the following:    Lipase 93 (*)    All other components within normal limits  URINALYSIS, ROUTINE W REFLEX MICROSCOPIC - Abnormal; Notable for the following:    Leukocytes, UA SMALL (*)    All other components within normal limits  URINE CULTURE  TROPONIN I  URINE MICROSCOPIC-ADD ON      1. Urinary retention       MDM  Urinary retention. The cause is unclear. The patient has been doing self-catheterization since recent Foley catheter placement. He has an upcoming appointment with his urologist. Doubt metabolic instability, serious bacterial infection or impending vascular collapse; the patient is stable for discharge.   Nursing Notes Reviewed/ Care Coordinated, and agree without changes. Applicable Imaging Reviewed.  Interpretation of Laboratory Data incorporated into ED treatment   Plan: Home Medications- usual; Home Treatments- Self cath TID; Recommended follow up- Urology as scheduled       Flint Melter, MD 09/12/12 0207

## 2012-09-13 LAB — URINE CULTURE

## 2012-09-22 ENCOUNTER — Encounter (HOSPITAL_COMMUNITY): Payer: PRIVATE HEALTH INSURANCE

## 2012-10-24 ENCOUNTER — Encounter (HOSPITAL_COMMUNITY): Payer: Self-pay | Admitting: *Deleted

## 2012-10-24 ENCOUNTER — Emergency Department (HOSPITAL_COMMUNITY)
Admission: EM | Admit: 2012-10-24 | Discharge: 2012-10-24 | Disposition: A | Discharge status: Home / Self Care | Payer: PRIVATE HEALTH INSURANCE | Attending: Emergency Medicine | Admitting: Emergency Medicine

## 2012-10-24 DIAGNOSIS — I2510 Atherosclerotic heart disease of native coronary artery without angina pectoris: Secondary | ICD-10-CM | POA: Insufficient documentation

## 2012-10-24 DIAGNOSIS — I129 Hypertensive chronic kidney disease with stage 1 through stage 4 chronic kidney disease, or unspecified chronic kidney disease: Secondary | ICD-10-CM | POA: Insufficient documentation

## 2012-10-24 DIAGNOSIS — Z862 Personal history of diseases of the blood and blood-forming organs and certain disorders involving the immune mechanism: Secondary | ICD-10-CM | POA: Insufficient documentation

## 2012-10-24 DIAGNOSIS — Z8781 Personal history of (healed) traumatic fracture: Secondary | ICD-10-CM | POA: Insufficient documentation

## 2012-10-24 DIAGNOSIS — F17200 Nicotine dependence, unspecified, uncomplicated: Secondary | ICD-10-CM | POA: Insufficient documentation

## 2012-10-24 DIAGNOSIS — Z79899 Other long term (current) drug therapy: Secondary | ICD-10-CM | POA: Insufficient documentation

## 2012-10-24 DIAGNOSIS — Z8679 Personal history of other diseases of the circulatory system: Secondary | ICD-10-CM | POA: Insufficient documentation

## 2012-10-24 DIAGNOSIS — Z9861 Coronary angioplasty status: Secondary | ICD-10-CM | POA: Insufficient documentation

## 2012-10-24 DIAGNOSIS — N400 Enlarged prostate without lower urinary tract symptoms: Secondary | ICD-10-CM | POA: Insufficient documentation

## 2012-10-24 DIAGNOSIS — E785 Hyperlipidemia, unspecified: Secondary | ICD-10-CM | POA: Insufficient documentation

## 2012-10-24 DIAGNOSIS — Z86718 Personal history of other venous thrombosis and embolism: Secondary | ICD-10-CM | POA: Insufficient documentation

## 2012-10-24 DIAGNOSIS — IMO0002 Reserved for concepts with insufficient information to code with codable children: Secondary | ICD-10-CM | POA: Insufficient documentation

## 2012-10-24 DIAGNOSIS — R21 Rash and other nonspecific skin eruption: Secondary | ICD-10-CM

## 2012-10-24 DIAGNOSIS — K219 Gastro-esophageal reflux disease without esophagitis: Secondary | ICD-10-CM | POA: Insufficient documentation

## 2012-10-24 DIAGNOSIS — R609 Edema, unspecified: Secondary | ICD-10-CM | POA: Insufficient documentation

## 2012-10-24 DIAGNOSIS — E119 Type 2 diabetes mellitus without complications: Secondary | ICD-10-CM | POA: Insufficient documentation

## 2012-10-24 DIAGNOSIS — N182 Chronic kidney disease, stage 2 (mild): Secondary | ICD-10-CM | POA: Insufficient documentation

## 2012-10-24 DIAGNOSIS — Z8774 Personal history of (corrected) congenital malformations of heart and circulatory system: Secondary | ICD-10-CM | POA: Insufficient documentation

## 2012-10-24 MED ORDER — FAMOTIDINE 20 MG PO TABS: 20.0000 mg | ORAL_TABLET | Freq: Every day | ORAL | Status: DC

## 2012-10-24 MED ORDER — PREDNISONE 20 MG PO TABS: ORAL_TABLET | ORAL | Status: DC

## 2012-10-24 MED ORDER — SULFAMETHOXAZOLE-TRIMETHOPRIM 800-160 MG PO TABS: 1.0000 | ORAL_TABLET | Freq: Two times a day (BID) | ORAL | Status: DC

## 2012-10-24 NOTE — ED Notes (Signed)
Per ems: pt from home, developed sores on back, neck and arms 2 weeks ago. Has seen dermatologist, prescribed a cream which pt does not remember name of. States rash/sores have gotten worse. Denies pain, c/o itching to sores. bp 128/98, pulse 94, cbg 115

## 2012-10-24 NOTE — ED Provider Notes (Signed)
History     CSN: [id number]  Arrival date & time 10/24/12  1358   First MD Initiated Contact with Patient 10/24/12 1406      Chief Complaint  Patient presents with  . Rash    (Consider location/radiation/quality/duration/timing/severity/associated sxs/prior treatment) HPI  Patient presents the EMS. He is very agitated and upset. It's hard to direct him to find out what is going on today. He reports he's been seen by Dr. Dorita Sciara dermatology office for several years. He has several concerns. He has some hypopigmented areas on his anterior lower leg that he has had since he had injuries as a child when he was 67 years old. He also has some other lesions on his back that he's had for several years. He states these were the most recent reason he went to see the dermatologist. He has had a skin biopsy but does not show what it shows. He was prescribed clobetasol ointment and hydroxyzine for itching and he states it is not helping. He states now he is having a new rash that has started since he was seen on the 17th of May. He states he called the office 4 times and was told to double the hydroxyzine. He states all this does is make him sleepy.  He denies fever, drainage from the lesions. He states he's never had this rash before. He states they also itch.  PCP Dr Lacey Jensen Cardiologist Dr Jones Broom Dermatologist Dr Dorita Sciara office  Past Medical History  Diagnosis Date  . Venous thromboembolism     DVTs in 2004, 2005, 2007, 8/10, 8/11. IVC filter 2005. Off coumadin 04/2011 secondary to rectal bleeding  . 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 Chronic venous insufficiency   . DM2 (diabetes mellitus, type 2)   . GERD (gastroesophageal reflux disease)   . BPH (benign prostatic hypertrophy)   . Low back pain   . HTN (hypertension)   . HLD (hyperlipidemia)   . NICM (nonischemic cardiomyopathy) 1. 9/11  2. 2/12    1. Echo septal and apical akinesis, dilated LV, EF 45%, RV  normal size and systolic function, EF 43% on Myoview  2. Admitted with decompensated CHF, echo EF 30-35% with diffuse hypokinesis but akinesis of mid to apical anteroseptal wall and apex, moderate LVH, mild MR, grade I diastolic dysfunction, SPEP/UP negative and HIV negative. Most recent EF 10-15% 04/2011  . Smoking   . CAD (coronary artery disease) 1. 2007  2. 10/11    1. Left heart cath with 40-50% stenosis in small RCA EF 50%  2. Lexiscan myoview EF 43%, global hypokinesis, possible small area of apical ischemia; LHC no angiographic CAD, no LV-gram done due to CKD   2. No angiographic CAD by cath 02/2010  . CKD (chronic kidney disease) stage 2, GFR 60-89 ml/min as of 02/2011  . Headache(784.0)   . Diabetes mellitus   . Blood transfusion   . Anemia   . Syncope     1. in setting of presumed orthostatic Hypotn 09/2011  . Right fibular fracture     1. 09/2011 - resulting from fall/syncope  . Closed right ankle fracture 10/10/2011    Past Surgical History  Procedure Laterality Date  . Cardiac catheterization    . Appendectomy    . Cholecystectomy    . Colonoscopy  05/01/2011    Procedure: COLONOSCOPY;  Surgeon: Louis Meckel, MD;  Location: [facility] ENDOSCOPY;  Service: Endoscopy;  Laterality: N/A;  .  Orif ankle fracture  10/10/2011    Procedure: OPEN REDUCTION INTERNAL FIXATION (ORIF) ANKLE FRACTURE;  Surgeon: Eulas Post, MD;  Location: [facility] OR;  Service: Orthopedics;  Laterality: Right;  Right  Ankle Fracture ORIF    Family History  Problem Relation Age of Onset  . Coronary artery disease Neg Hx     Premature    History  Substance Use Topics  . Smoking status: Current Some Day Smoker -- 1.00 packs/day for 40 years    Types: Cigarettes    Last Attempt to Quit: 06/30/2011  . Smokeless tobacco: Never Used  . Alcohol Use: No   Lives at home   Review of Systems  All other systems reviewed and are negative.    Allergies  Review of patient's allergies indicates no known  allergies.  Home Medications   Current Outpatient Rx  Name  Route  Sig  Dispense  Refill  . atorvastatin (LIPITOR) 20 MG tablet   Oral   Take 20 mg by mouth every morning.          . clobetasol cream (TEMOVATE) 0.05 %   Topical   Apply 1 application topically 4 (four) times daily as needed (for rash.).         Marland Kitchen digoxin (LANOXIN) 0.125 MG tablet   Oral   Take 1 tablet (0.125 mg total) by mouth daily.   90 tablet   1     Patient needs an appointment to see cardiologist A ...   . eplerenone (INSPRA) 25 MG tablet   Oral   Take 1 tablet (25 mg total) by mouth daily.         Marland Kitchen glipiZIDE (GLUCOTROL XL) 5 MG 24 hr tablet   Oral   Take 5 mg by mouth daily.          . meclizine (ANTIVERT) 25 MG tablet   Oral   Take 25 mg by mouth 3 (three) times daily as needed. For vertigo         . metolazone (ZAROXOLYN) 2.5 MG tablet   Oral   Take 1 tablet (2.5 mg total) by mouth every Saturday at 6 PM.   10 tablet   3   . metoprolol succinate (TOPROL-XL) 50 MG 24 hr tablet   Oral   Take 50 mg by mouth daily. Take with or immediately following a meal.         . pantoprazole (PROTONIX) 40 MG tablet   Oral   Take 1 tablet (40 mg total) by mouth daily.   30 tablet   4   . Tamsulosin HCl (FLOMAX) 0.4 MG CAPS   Oral   Take 1 capsule (0.4 mg total) by mouth at bedtime.   90 capsule   1   . torsemide (DEMADEX) 20 MG tablet   Oral   Take 1 tablet (20 mg total) by mouth daily.   90 tablet   1     Patient needs an appointment to see cardiologist A ...     BP 141/92  Pulse 115  Temp(Src) 98.1 F (36.7 C) (Oral)  Resp 18  SpO2 90%  Vital signs normal except tachycardia   Physical Exam  Nursing note and vitals reviewed. Constitutional: He is oriented to person, place, and time. He appears well-developed and well-nourished.  Non-toxic appearance. He does not appear ill. No distress.  Agitated, hostile, speaking loudly  HENT:  Head: Normocephalic and atraumatic.   Right Ear: External ear normal.  Left Ear: External  ear normal.  Nose: Nose normal. No mucosal edema or rhinorrhea.  Mouth/Throat: Oropharynx is clear and moist and mucous membranes are normal. No dental abscesses or edematous.  Eyes: Conjunctivae and EOM are normal. Pupils are equal, round, and reactive to light.  Neck: Normal range of motion and full passive range of motion without pain. Neck supple.  Pulmonary/Chest: Effort normal and breath sounds normal. No respiratory distress. He has no rhonchi. He exhibits no crepitus.  Abdominal: Normal appearance.  Musculoskeletal: Normal range of motion. He exhibits edema. He exhibits no tenderness.  Moves all extremities well. Patient has been edema of his lower legs and states "I'm not concerned about that it and taking fluid pills."  Neurological: He is alert and oriented to person, place, and time. He has normal strength. No cranial nerve deficit.  Skin: Skin is warm, dry and intact. Rash noted. No erythema. No pallor.  Patient has a large area on his anterior right lower leg of hypopigmentation that he states he has had on his legs since he was 67 years old from an injury. He has a smaller area on his anterior left lower leg from the same incident.  Patient's noted to have a couple large hyperpigmented areas some with some superficial loss of epidermis that he states have been there for years. These were the original lesion she went to be seen for and he was prescribed a steroid cream and the hydroxyzine.  Patient now has scattered papules on his upper back, upper chest, rare on his arms, some on his neck that he states are new. They do not have a red base, they are normal skin color and are raised, and there appears to be a black dot in the center of each one which may be a hair follicle, although there are no hairs visible This may be a folliculitis or papulitis.  Psychiatric: His speech is normal. His mood appears not anxious.  Agitated    ED  Course  Procedures (including critical care time)   1. Papular rash    New Prescriptions   FAMOTIDINE (PEPCID) 20 MG TABLET    Take 1 tablet (20 mg total) by mouth daily.   PREDNISONE (DELTASONE) 20 MG TABLET   2 po QD x 3d then 1 po QD x 3d   SULFAMETHOXAZOLE-TRIMETHOPRIM (SEPTRA DS) 800-160 MG PER TABLET    Take 1 tablet by mouth 2 (two) times daily.    Plan discharge  Devoria Albe, MD, FACEP   MDM          Ward Givens, MD 10/24/12 [id number]

## 2012-10-24 NOTE — ED Notes (Addendum)
Pt reports being evaluated at [facility] - was prescribed clobetasol and instructed to apply vaseline jelly twice daily.

## 2012-10-24 NOTE — ED Notes (Signed)
Pt has rash to bilateral arms, neck and face. Left eye slightly swollen

## 2012-10-24 NOTE — ED Notes (Signed)
[id number]<JY> Expected date:<BR> Expected time:<BR> Means of arrival:<BR> Comments:<BR> Ems, rash on body

## 2012-10-28 ENCOUNTER — Other Ambulatory Visit (HOSPITAL_COMMUNITY): Payer: Self-pay | Admitting: *Deleted

## 2012-10-28 MED ORDER — METOLAZONE 2.5 MG PO TABS: 2.5000 mg | ORAL_TABLET | ORAL | Status: DC

## 2012-11-05 ENCOUNTER — Telehealth (HOSPITAL_COMMUNITY): Payer: Self-pay | Admitting: *Deleted

## 2012-11-05 NOTE — Telephone Encounter (Signed)
Pt called concerned about lossing wt, he states his wt today is 164, when he saw [place] in April it was 184 but has been dropping recently, he states he feels "fantastic" when his BP was checked last week it was normal, he states he is urinating on his own now and going a lot.  He will hold metolazone this weekend and will also hold Torsemide today and tomorrow, if wt continues to drop he will continue to hold Torsemide, appt sch for Mon at 1pm

## 2012-11-08 ENCOUNTER — Ambulatory Visit (HOSPITAL_COMMUNITY)
Admission: RE | Admit: 2012-11-08 | Discharge: 2012-11-08 | Disposition: A | Discharge status: Home / Self Care | Payer: PRIVATE HEALTH INSURANCE | Source: Ambulatory Visit | Attending: Internal Medicine | Admitting: Internal Medicine

## 2012-11-08 VITALS — HR 110 | Wt 172.5 lb

## 2012-11-08 DIAGNOSIS — Z79899 Other long term (current) drug therapy: Secondary | ICD-10-CM | POA: Insufficient documentation

## 2012-11-08 DIAGNOSIS — K31819 Angiodysplasia of stomach and duodenum without bleeding: Secondary | ICD-10-CM | POA: Insufficient documentation

## 2012-11-08 DIAGNOSIS — IMO0002 Reserved for concepts with insufficient information to code with codable children: Secondary | ICD-10-CM | POA: Insufficient documentation

## 2012-11-08 DIAGNOSIS — K219 Gastro-esophageal reflux disease without esophagitis: Secondary | ICD-10-CM | POA: Insufficient documentation

## 2012-11-08 DIAGNOSIS — G8929 Other chronic pain: Secondary | ICD-10-CM | POA: Insufficient documentation

## 2012-11-08 DIAGNOSIS — I10 Essential (primary) hypertension: Secondary | ICD-10-CM | POA: Insufficient documentation

## 2012-11-08 DIAGNOSIS — I428 Other cardiomyopathies: Secondary | ICD-10-CM | POA: Insufficient documentation

## 2012-11-08 DIAGNOSIS — M545 Low back pain, unspecified: Secondary | ICD-10-CM | POA: Insufficient documentation

## 2012-11-08 DIAGNOSIS — F17200 Nicotine dependence, unspecified, uncomplicated: Secondary | ICD-10-CM | POA: Insufficient documentation

## 2012-11-08 DIAGNOSIS — I5020 Unspecified systolic (congestive) heart failure: Secondary | ICD-10-CM | POA: Insufficient documentation

## 2012-11-08 DIAGNOSIS — I2510 Atherosclerotic heart disease of native coronary artery without angina pectoris: Secondary | ICD-10-CM | POA: Insufficient documentation

## 2012-11-08 DIAGNOSIS — E785 Hyperlipidemia, unspecified: Secondary | ICD-10-CM | POA: Insufficient documentation

## 2012-11-08 DIAGNOSIS — I5022 Chronic systolic (congestive) heart failure: Secondary | ICD-10-CM

## 2012-11-08 MED ORDER — METOLAZONE 2.5 MG PO TABS: 2.5000 mg | ORAL_TABLET | ORAL | Status: DC | PRN

## 2012-11-08 NOTE — Patient Instructions (Addendum)
Follow up in 1 month  Hold Torsemide if your weight is less than 165 pounds  Take an additional 20 mg of Torsemide if your weight is 173 pounds or greater.   Only take Metolazone if your weight is 175 pounds or greater. ( Only on Saturday)  Do the following things EVERYDAY: 1) Weigh yourself in the morning before breakfast. Write it down and keep it in a log. 2) Take your medicines as prescribed 3) Eat low salt foods-Limit salt (sodium) to 2000 mg per day.  4) Stay as active as you can everyday 5) Limit all fluids for the day to less than 2 liters

## 2012-11-08 NOTE — Assessment & Plan Note (Addendum)
Functionally he is able to ambulate without dyspnea. Volume status stable. Continue Torsemide 20 mg daily and take an additional 20 mg of Torsemide if his weight is 173 pounds or greater. Instructed to hold torsemide if his weight is < 165 pounds. He will only take  take Metolazone if his weight is 175 pounds or greater (once a week on Saturday). Follow up in 1 month.

## 2012-11-08 NOTE — Progress Notes (Signed)
Patient ID: [name], male   DOB: 02-08-1946, 67 y.o.   MRN: [id number] PCP: Dr Pecola Leisure Urologist: Dr Retta Diones  HPI:  [name] is a 67 year old male with chronic systolic CHF secondary to NICM echo EF 30-35%, HTN, anemia, chronic renal insufficiency Cr 1.31,  UGI bleed,  duodenal AVMs,  rectal bleeding 04/2011,  DVTs (2004, 2005, 2007, 2010, 2011) however coumadin on hold due to GI bleed, IVC filter 2005, GERD, BPD, and chronic low back .  09/2011 S/P ORIF L ankle. Spironolactone switched to eplerenone due to gynecomastia.  Discharged 06/25/11  Weight 159 pounds.  RHC 2/13 RA = 4  RV = 32/6 (7)  PA = 45/15 (24)  PCW = 7  Fick cardiac output/index = 5.2/2.7  Thermo = 4.0/2.1  PVR = 3.4 Woods  FA sat = 94%  PA sat = 56%, 57%  Noninvasive BP : 125/92 (72)  Calculated SVR = 1040   08/13/11 CPX  RER 1.16 Peak VO2 18.6 ml/kg/min Predicted 61.4% VE/VCO2 slope 42.8 08/23/2011 EF 30-35%  Admitted from HF clinic 05/30/2012 with ~40-50 volume overload. Diuresed with Milrinone and Lasix. Diuresed 41 pounds. Milrinone and lasix gtt weaned off. CO-OX dropped 56>50>43. Beta blocker was stopped due to low output. He was offered home Milrinone however he declined.  Discharge weight 175 pounds.   Presented to [facility] 08/31/12 complaining of dysuria. Urinary retention noted. He followed up with Dr Retta Diones with recommendations for I/O catheter and  Cipro.   He returns for follow up today. On Friday he was instructed to hold Metolazone and Torsemide over the weekend when his weight was 160 pounds. Overall he says he feels great. Denies PND/Orthopnea. Weight ranges 160-169 pounds. Has aide several hours a day. Not currently performing I/O caths and he has noticed his urine is dark brown.    ROS: All systems negative except as listed in HPI, PMH and Problem List.  Past Medical History  Diagnosis Date  . Venous thromboembolism     DVTs in 2004, 2005, 2007, 8/10, 8/11. IVC filter 2005. Off coumadin 04/2011  secondary to rectal bleeding  . 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 Chronic venous insufficiency   . DM2 (diabetes mellitus, type 2)   . GERD (gastroesophageal reflux disease)   . BPH (benign prostatic hypertrophy)   . Low back pain   . HTN (hypertension)   . HLD (hyperlipidemia)   . NICM (nonischemic cardiomyopathy) 1. 9/11  2. 2/12    1. Echo septal and apical akinesis, dilated LV, EF 45%, RV normal size and systolic function, EF 43% on Myoview  2. Admitted with decompensated CHF, echo EF 30-35% with diffuse hypokinesis but akinesis of mid to apical anteroseptal wall and apex, moderate LVH, mild MR, grade I diastolic dysfunction, SPEP/UP negative and HIV negative. Most recent EF 10-15% 04/2011  . Smoking   . CAD (coronary artery disease) 1. 2007  2. 10/11    1. Left heart cath with 40-50% stenosis in small RCA EF 50%  2. Lexiscan myoview EF 43%, global hypokinesis, possible small area of apical ischemia; LHC no angiographic CAD, no LV-gram done due to CKD   2. No angiographic CAD by cath 02/2010  . CKD (chronic kidney disease) stage 2, GFR 60-89 ml/min as of 02/2011  . Headache(784.0)   . Diabetes mellitus   . Blood transfusion   . Anemia   . Syncope     1. in setting of  presumed orthostatic Hypotn 09/2011  . Right fibular fracture     1. 09/2011 - resulting from fall/syncope  . Closed right ankle fracture 10/10/2011    Current Outpatient Prescriptions  Medication Sig Dispense Refill  . atorvastatin (LIPITOR) 20 MG tablet Take 20 mg by mouth every morning.       . clobetasol cream (TEMOVATE) 0.05 % Apply 1 application topically 4 (four) times daily as needed (for rash.).      Marland Kitchen digoxin (LANOXIN) 0.125 MG tablet Take 1 tablet (0.125 mg total) by mouth daily.  90 tablet  1  . eplerenone (INSPRA) 25 MG tablet Take 1 tablet (25 mg total) by mouth daily.      . famotidine (PEPCID) 20 MG tablet Take 1 tablet (20 mg total) by mouth daily.  20 tablet  0  .  glipiZIDE (GLUCOTROL XL) 5 MG 24 hr tablet Take 5 mg by mouth daily.       . metoprolol succinate (TOPROL-XL) 50 MG 24 hr tablet Take 50 mg by mouth daily. Take with or immediately following a meal.      . pantoprazole (PROTONIX) 40 MG tablet Take 1 tablet (40 mg total) by mouth daily.  30 tablet  4  . Tamsulosin HCl (FLOMAX) 0.4 MG CAPS Take 1 capsule (0.4 mg total) by mouth at bedtime.  90 capsule  1  . torsemide (DEMADEX) 20 MG tablet Take 1 tablet (20 mg total) by mouth daily.  90 tablet  1  . meclizine (ANTIVERT) 25 MG tablet Take 25 mg by mouth 3 (three) times daily as needed. For vertigo      . metolazone (ZAROXOLYN) 2.5 MG tablet Take 1 tablet (2.5 mg total) by mouth every Saturday at 6 PM.  10 tablet  3  . predniSONE (DELTASONE) 20 MG tablet Take 3 po QD x 3d , then 2 po QD x 3d then 1 po QD x 3d  18 tablet  0  . sulfamethoxazole-trimethoprim (SEPTRA DS) 800-160 MG per tablet Take 1 tablet by mouth 2 (two) times daily.  20 tablet  0   No current facility-administered medications for this encounter.     PHYSICAL EXAM: Filed Vitals:   11/08/12 1309  Pulse: 110  Weight: 172 lb 8 oz (78.245 kg)  SpO2: 100%   General: Elderly.  No acute distress.  HEENT: normal except for poor dentition Neck: supple. JVP 7-8 Carotids 2+ bilaterally; no bruits. No lymphadenopathy or thryomegaly appreciated. Cor: PMI displaced laterally. Tachycardic regular rate and rhythm. +S3 Lungs: clear Abdomen: Non tender. Good bowel sounds. Mildly distended Extremities: no cyanosis, clubbing, rash, tr edema  RLE and LLENeuro: alert & orientedx3, cranial nerves grossly intact. Moves all 4 extremities w/o difficulty. Affect pleasant.    ASSESSMENT & PLAN:

## 2012-11-08 NOTE — Assessment & Plan Note (Signed)
Instructed to resume I/O cath as directed by Dr Retta Diones.

## 2012-12-09 ENCOUNTER — Ambulatory Visit (HOSPITAL_COMMUNITY)
Admission: RE | Admit: 2012-12-09 | Discharge: 2012-12-09 | Disposition: A | Discharge status: Home / Self Care | Payer: PRIVATE HEALTH INSURANCE | Source: Ambulatory Visit | Attending: Cardiology | Admitting: Cardiology

## 2012-12-09 VITALS — BP 100/78 | HR 112 | Wt 163.0 lb

## 2012-12-09 DIAGNOSIS — R0600 Dyspnea, unspecified: Secondary | ICD-10-CM

## 2012-12-09 DIAGNOSIS — I509 Heart failure, unspecified: Secondary | ICD-10-CM

## 2012-12-09 DIAGNOSIS — I129 Hypertensive chronic kidney disease with stage 1 through stage 4 chronic kidney disease, or unspecified chronic kidney disease: Secondary | ICD-10-CM | POA: Insufficient documentation

## 2012-12-09 DIAGNOSIS — I5022 Chronic systolic (congestive) heart failure: Secondary | ICD-10-CM | POA: Insufficient documentation

## 2012-12-09 DIAGNOSIS — D649 Anemia, unspecified: Secondary | ICD-10-CM | POA: Insufficient documentation

## 2012-12-09 DIAGNOSIS — N189 Chronic kidney disease, unspecified: Secondary | ICD-10-CM | POA: Insufficient documentation

## 2012-12-09 DIAGNOSIS — I5043 Acute on chronic combined systolic (congestive) and diastolic (congestive) heart failure: Secondary | ICD-10-CM

## 2012-12-09 DIAGNOSIS — R0989 Other specified symptoms and signs involving the circulatory and respiratory systems: Secondary | ICD-10-CM

## 2012-12-09 LAB — BASIC METABOLIC PANEL
BUN: 48 mg/dL — ABNORMAL HIGH (ref 6–23)
Calcium: 10 mg/dL (ref 8.4–10.5)
Creatinine, Ser: 1.77 mg/dL — ABNORMAL HIGH (ref 0.50–1.35)
GFR calc Af Amer: 45 mL/min — ABNORMAL LOW (ref >90)
GFR calc non Af Amer: 39 mL/min — ABNORMAL LOW (ref >90)
Potassium: 3.6 mEq/L (ref 3.5–5.1)

## 2012-12-09 MED ORDER — HYDRALAZINE HCL 10 MG PO TABS: 10.0000 mg | ORAL_TABLET | Freq: Three times a day (TID) | ORAL | Status: DC

## 2012-12-09 MED ORDER — ISOSORBIDE MONONITRATE ER 30 MG PO TB24: 15.0000 mg | ORAL_TABLET | Freq: Every day | ORAL | Status: DC

## 2012-12-09 NOTE — Patient Instructions (Addendum)
Take hydralazine 10 mg three times a day  Take IMDUR 15 mg daily  Hold Torsemide if your weight is less than 160 pounds  Do the following things EVERYDAY: 1) Weigh yourself in the morning before breakfast. Write it down and keep it in a log. 2) Take your medicines as prescribed 3) Eat low salt foods-Limit salt (sodium) to 2000 mg per day.  4) Stay as active as you can everyday 5) Limit all fluids for the day to less than 2 liters  Follow up in 1 month

## 2012-12-09 NOTE — Assessment & Plan Note (Addendum)
NYHA II. Doing pretty well but still with mild dyspnea . Volume status stable. Continue torsemide 20 mg daily and hold if weight is < 160 pounds. Continue Toprol XL 50 mg daily. Add hydralazine 10 mg tid and IMDUR 15 mg daily. Reinforced daily weights, low salt food choices, and limiting fluid intake to < 2 liters. Check BMET, pro bnp, and dig level  today.  Follow up in 1 month.

## 2012-12-09 NOTE — Progress Notes (Signed)
Patient ID: CHRISTPHOR GROFT, male   DOB: Oct 07, 1945, 67 y.o.   MRN: [id number] PCP: Dr Pecola Leisure Urologist: Dr Retta Diones  HPI:  Mr. Omary is a 67 year old male with chronic systolic CHF secondary to NICM echo EF 30-35%, HTN, anemia, chronic renal insufficiency Cr 1.31,  UGI bleed,  duodenal AVMs,  rectal bleeding 04/2011,  DVTs (2004, 2005, 2007, 2010, 2011) however coumadin on hold due to GI bleed, IVC filter 2005, GERD, BPD, and chronic low back .  09/2011 S/P ORIF L ankle. Spironolactone switched to eplerenone due to gynecomastia.  Discharged 06/25/11  Weight 159 pounds.  RHC 2/13 RA = 4  RV = 32/6 (7)  PA = 45/15 (24)  PCW = 7  Fick cardiac output/index = 5.2/2.7  Thermo = 4.0/2.1  PVR = 3.4 Woods  FA sat = 94%  PA sat = 56%, 57%  Noninvasive BP : 125/92 (72)  Calculated SVR = 1040   08/13/11 CPX  RER 1.16 Peak VO2 18.6 ml/kg/min Predicted 61.4% VE/VCO2 slope 42.8 08/23/2011 EF 30-35%  Admitted from HF clinic 05/30/2012 with ~40-50 volume overload. Diuresed with Milrinone and Lasix. Diuresed 41 pounds. Milrinone and lasix gtt weaned off. CO-OX dropped 56>50>43. Beta blocker was stopped due to low output. He was offered home Milrinone however he declined.  Discharge weight 175 pounds.   Presented to [facility] 08/31/12 complaining of dysuria. Urinary retention noted. He followed up with Dr Retta Diones with recommendations for I/O catheter and  Cipro.    He returns for follow up today. Last visit he was instructed to continue torsemide 20 mg daily and add 20 mg of torsemide for a weight 173 pounds or greater and to hold torsemide for weight < 165  He was also instructed to only take Metolazone if his weight was 175 pounds or greater on Saturday. Denies SOB/PND/Orthonea/CP. He is able walk up steps.  Weight at home trending down to 163 pounds but he continue to take 20 mg of torsemide daily. Cholesterol managed by PCP Dr Despina Arias.    ROS: All systems negative except as listed in HPI, PMH and Problem  List.  Past Medical History  Diagnosis Date  . Venous thromboembolism     DVTs in 2004, 2005, 2007, 8/10, 8/11. IVC filter 2005. Off coumadin 04/2011 secondary to rectal bleeding  . 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 Chronic venous insufficiency   . DM2 (diabetes mellitus, type 2)   . GERD (gastroesophageal reflux disease)   . BPH (benign prostatic hypertrophy)   . Low back pain   . HTN (hypertension)   . HLD (hyperlipidemia)   . NICM (nonischemic cardiomyopathy) 1. 9/11  2. 2/12    1. Echo septal and apical akinesis, dilated LV, EF 45%, RV normal size and systolic function, EF 43% on Myoview  2. Admitted with decompensated CHF, echo EF 30-35% with diffuse hypokinesis but akinesis of mid to apical anteroseptal wall and apex, moderate LVH, mild MR, grade I diastolic dysfunction, SPEP/UP negative and HIV negative. Most recent EF 10-15% 04/2011  . Smoking   . CAD (coronary artery disease) 1. 2007  2. 10/11    1. Left heart cath with 40-50% stenosis in small RCA EF 50%  2. Lexiscan myoview EF 43%, global hypokinesis, possible small area of apical ischemia; LHC no angiographic CAD, no LV-gram done due to CKD   2. No angiographic CAD by cath 02/2010  . CKD (chronic kidney disease) stage 2, GFR  60-89 ml/min as of 02/2011  . Headache(784.0)   . Diabetes mellitus   . Blood transfusion   . Anemia   . Syncope     1. in setting of presumed orthostatic Hypotn 09/2011  . Right fibular fracture     1. 09/2011 - resulting from fall/syncope  . Closed right ankle fracture 10/10/2011    Current Outpatient Prescriptions  Medication Sig Dispense Refill  . atorvastatin (LIPITOR) 20 MG tablet Take 20 mg by mouth every morning.       . clobetasol cream (TEMOVATE) 0.05 % Apply 1 application topically 4 (four) times daily as needed (for rash.).      Marland Kitchen digoxin (LANOXIN) 0.125 MG tablet Take 1 tablet (0.125 mg total) by mouth daily.  90 tablet  1  . eplerenone (INSPRA) 25 MG tablet  Take 1 tablet (25 mg total) by mouth daily.      . famotidine (PEPCID) 20 MG tablet Take 1 tablet (20 mg total) by mouth daily.  20 tablet  0  . meclizine (ANTIVERT) 25 MG tablet Take 25 mg by mouth 3 (three) times daily as needed. For vertigo      . metolazone (ZAROXOLYN) 2.5 MG tablet Take 1 tablet (2.5 mg total) by mouth as needed.  10 tablet  3  . metoprolol succinate (TOPROL-XL) 50 MG 24 hr tablet Take 50 mg by mouth daily. Take with or immediately following a meal.      . pantoprazole (PROTONIX) 40 MG tablet Take 1 tablet (40 mg total) by mouth daily.  30 tablet  4  . predniSONE (DELTASONE) 20 MG tablet Take 3 po QD x 3d , then 2 po QD x 3d then 1 po QD x 3d  18 tablet  0  . sulfamethoxazole-trimethoprim (SEPTRA DS) 800-160 MG per tablet Take 1 tablet by mouth 2 (two) times daily.  20 tablet  0  . Tamsulosin HCl (FLOMAX) 0.4 MG CAPS Take 1 capsule (0.4 mg total) by mouth at bedtime.  90 capsule  1  . torsemide (DEMADEX) 20 MG tablet Take 1 tablet (20 mg total) by mouth daily.  90 tablet  1  . glipiZIDE (GLUCOTROL XL) 5 MG 24 hr tablet Take 5 mg by mouth daily.        No current facility-administered medications for this encounter.     PHYSICAL EXAM: Filed Vitals:   12/09/12 1401  BP: 100/78  Pulse: 112  Weight: 163 lb (73.936 kg)  SpO2: 96%   General: Elderly.  No acute distress.  HEENT: normal except for poor dentition Neck: supple. JVP 6-7 Carotids 2+ bilaterally; no bruits. No lymphadenopathy or thryomegaly appreciated. Cor: PMI displaced laterally. Tachycardic regular rate and rhythm. +S3 Lungs: clear Abdomen: Non tender. Good bowel sounds. Mildly distended Extremities: no cyanosis, clubbing, rash, LLE tr and  RLE 1+ Neuro: alert & orientedx3, cranial nerves grossly intact. Moves all 4 extremities w/o difficulty. Affect pleasant.    ASSESSMENT & PLAN:

## 2013-01-13 ENCOUNTER — Ambulatory Visit (HOSPITAL_COMMUNITY)
Admission: RE | Admit: 2013-01-13 | Discharge: 2013-01-13 | Disposition: A | Discharge status: Home / Self Care | Payer: PRIVATE HEALTH INSURANCE | Source: Ambulatory Visit | Attending: Internal Medicine | Admitting: Internal Medicine

## 2013-01-13 ENCOUNTER — Encounter (HOSPITAL_COMMUNITY): Payer: Self-pay

## 2013-01-13 VITALS — BP 124/90 | HR 113 | Wt 163.4 lb

## 2013-01-13 DIAGNOSIS — I428 Other cardiomyopathies: Secondary | ICD-10-CM | POA: Insufficient documentation

## 2013-01-13 DIAGNOSIS — K31819 Angiodysplasia of stomach and duodenum without bleeding: Secondary | ICD-10-CM | POA: Insufficient documentation

## 2013-01-13 DIAGNOSIS — Z79899 Other long term (current) drug therapy: Secondary | ICD-10-CM | POA: Insufficient documentation

## 2013-01-13 DIAGNOSIS — I129 Hypertensive chronic kidney disease with stage 1 through stage 4 chronic kidney disease, or unspecified chronic kidney disease: Secondary | ICD-10-CM | POA: Insufficient documentation

## 2013-01-13 DIAGNOSIS — M545 Low back pain, unspecified: Secondary | ICD-10-CM | POA: Insufficient documentation

## 2013-01-13 DIAGNOSIS — I509 Heart failure, unspecified: Secondary | ICD-10-CM | POA: Insufficient documentation

## 2013-01-13 DIAGNOSIS — K219 Gastro-esophageal reflux disease without esophagitis: Secondary | ICD-10-CM | POA: Insufficient documentation

## 2013-01-13 DIAGNOSIS — G8929 Other chronic pain: Secondary | ICD-10-CM | POA: Insufficient documentation

## 2013-01-13 DIAGNOSIS — N189 Chronic kidney disease, unspecified: Secondary | ICD-10-CM | POA: Insufficient documentation

## 2013-01-13 DIAGNOSIS — I5022 Chronic systolic (congestive) heart failure: Secondary | ICD-10-CM

## 2013-01-13 DIAGNOSIS — D649 Anemia, unspecified: Secondary | ICD-10-CM | POA: Insufficient documentation

## 2013-01-13 DIAGNOSIS — I82409 Acute embolism and thrombosis of unspecified deep veins of unspecified lower extremity: Secondary | ICD-10-CM

## 2013-01-13 NOTE — Progress Notes (Addendum)
Patient ID: [name], male   DOB: 1945/08/31, 67 y.o.   MRN: [id number]  PCP: Dr. Jeanelle Malling Osei-Bonsu Urologist: Dr Retta Diones  HPI:  [name] is a 67 year old male with chronic systolic CHF secondary to NICM echo EF 30-35%, HTN, anemia, chronic renal insufficiency,  UGI bleed,  duodenal AVMs,  rectal bleeding 04/2011,  DVTs (2004, 2005, 2007, 2010, 2011) however coumadin on hold due to GI bleed, IVC filter 2005, GERD, BPD, and chronic low back .  09/2011 S/P ORIF L ankle. Spironolactone switched to eplerenone due to gynecomastia.  Discharged 06/25/11  Weight 159 pounds.  RHC 2/13 RA = 4  RV = 32/6 (7)  PA = 45/15 (24)  PCW = 7  Fick cardiac output/index = 5.2/2.7  Thermo = 4.0/2.1  PVR = 3.4 Woods  FA sat = 94%  PA sat = 56%, 57%  Noninvasive BP : 125/92 (72)  Calculated SVR = 1040  08/13/11 CPX  RER 1.16 Peak VO2 18.6 ml/kg/min Predicted 61.4% VE/VCO2 slope 42.8 08/23/2011 EF 30-35%  Admitted from HF clinic 05/30/2012 with ~40-50 volume overload. Diuresed with Milrinone and Lasix. Diuresed 41 pounds. Milrinone and lasix gtt weaned off. CO-OX dropped 43. Beta blocker was stopped due to low output. He was offered home Milrinone however he declined.  Discharge weight 175 pounds.    Presented to [facility] 08/31/12 complaining of dysuria. Urinary retention noted. He followed up with Dr Retta Diones with recommendations for I/O catheter and  Cipro.    Follow up: Last visit started hydralazine 10 mg TID and INDUR 15 mg daily. Feeling good. Weight at home 160 lbs. Takes torsemide only if weight is greater than 160 lbs, he has only needed it a few times. Only take metolazone if weight > 175 lbs, he has not needed. Denies SOB, edema, orthopnea, or CP. Can walk up stairs, but gets a little fatigued. Lipids managed by PCP. Taking medications as prescribed.    ROS: All systems negative except as listed in HPI, PMH and Problem List.  Past Medical History  Diagnosis Date  . Venous thromboembolism     DVTs  in 2004, 2005, 2007, 8/10, 8/11. IVC filter 2005. Off coumadin 04/2011 secondary to rectal bleeding  . 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 Chronic venous insufficiency   . DM2 (diabetes mellitus, type 2)   . GERD (gastroesophageal reflux disease)   . BPH (benign prostatic hypertrophy)   . Low back pain   . HTN (hypertension)   . HLD (hyperlipidemia)   . NICM (nonischemic cardiomyopathy) 1. 9/11  2. 2/12    1. Echo septal and apical akinesis, dilated LV, EF 45%, RV normal size and systolic function, EF 43% on Myoview  2. Admitted with decompensated CHF, echo EF 30-35% with diffuse hypokinesis but akinesis of mid to apical anteroseptal wall and apex, moderate LVH, mild MR, grade I diastolic dysfunction, SPEP/UP negative and HIV negative. Most recent EF 10-15% 04/2011  . Smoking   . CAD (coronary artery disease) 1. 2007  2. 10/11    1. Left heart cath with 40-50% stenosis in small RCA EF 50%  2. Lexiscan myoview EF 43%, global hypokinesis, possible small area of apical ischemia; LHC no angiographic CAD, no LV-gram done due to CKD   2. No angiographic CAD by cath 02/2010  . CKD (chronic kidney disease) stage 2, GFR 60-89 ml/min as of 02/2011  . Headache(784.0)   . Diabetes mellitus   .  Blood transfusion   . Anemia   . Syncope     1. in setting of presumed orthostatic Hypotn 09/2011  . Right fibular fracture     1. 09/2011 - resulting from fall/syncope  . Closed right ankle fracture 10/10/2011    Current Outpatient Prescriptions  Medication Sig Dispense Refill  . atorvastatin (LIPITOR) 20 MG tablet Take 20 mg by mouth every morning.       . clobetasol cream (TEMOVATE) 0.05 % Apply 1 application topically 4 (four) times daily as needed (for rash.).      Marland Kitchen digoxin (LANOXIN) 0.125 MG tablet Take 1 tablet (0.125 mg total) by mouth daily.  90 tablet  1  . eplerenone (INSPRA) 25 MG tablet Take 1 tablet (25 mg total) by mouth daily.      . famotidine (PEPCID) 20 MG  tablet Take 1 tablet (20 mg total) by mouth daily.  20 tablet  0  . glipiZIDE (GLUCOTROL XL) 5 MG 24 hr tablet Take 5 mg by mouth daily.       . hydrALAZINE (APRESOLINE) 10 MG tablet Take 1 tablet (10 mg total) by mouth 3 (three) times daily.  90 tablet  3  . isosorbide mononitrate (IMDUR) 30 MG 24 hr tablet Take 0.5 tablets (15 mg total) by mouth daily.  15 tablet  3  . meclizine (ANTIVERT) 25 MG tablet Take 25 mg by mouth 3 (three) times daily as needed. For vertigo      . metolazone (ZAROXOLYN) 2.5 MG tablet Take 1 tablet (2.5 mg total) by mouth as needed.  10 tablet  3  . metoprolol succinate (TOPROL-XL) 50 MG 24 hr tablet Take 50 mg by mouth daily. Take with or immediately following a meal.      . pantoprazole (PROTONIX) 40 MG tablet Take 1 tablet (40 mg total) by mouth daily.  30 tablet  4  . Tamsulosin HCl (FLOMAX) 0.4 MG CAPS Take 1 capsule (0.4 mg total) by mouth at bedtime.  90 capsule  1  . torsemide (DEMADEX) 20 MG tablet Take 1 tablet (20 mg total) by mouth daily.  90 tablet  1   No current facility-administered medications for this encounter.   PHYSICAL EXAM: Filed Vitals:   01/13/13 1435  BP: 124/90  Pulse: 113  Weight: 163 lb 6.4 oz (74.118 kg)  SpO2: 97%   General: Elderly.  No acute distress.  HEENT: normal except for poor dentition Neck: supple. JVP 7-8, prominent V waves Carotids 2+ bilaterally; no bruits. No lymphadenopathy or thryomegaly appreciated. Cor: PMI displaced laterally. Tachycardic regular rate and rhythm. +S3 Lungs: clear Abdomen: Non tender. Good bowel sounds. Mildly distended Extremities: no cyanosis, clubbing, rash, bilateral trace edema Neuro: alert & orientedx3, cranial nerves grossly intact. Moves all 4 extremities w/o difficulty. Affect pleasant.  ASSESSMENT & PLAN:  1) Chronic systolic HF, EF 30-35% (05/2012), mod/severe TR   - NYHA II. Volume status good. Patient re-instructed to take torsemide only if weight > 160 lbs and metolazone if  weight > 175 lbs.  - Patient is tachycardic, concerned about low-output with previous history and declining home milrinone. He does not have any complaints and feels great. Will not titrate BB at this time.  - continue digoxin - He has room to titrate hydralazine and IMDUR, howeverwill not increase this visit d/t patient getting ready to travel for a month and is doing so well. - EF remains less than 35%, discussed with patient again the risk for SCD and the recommendation  of being referred for an ICD. Mr. Age currently wants to go on trip and will think about it and discuss at the next visit.  2) Recurrent DVTs - not currently on coumadin d/t rectal bleeding and duodenal AVMs - no s/s of DVT currenlty  3) CRI stage 3 - Patient states had BMET this week with new PCP and to obtain results from there.   Ulla Potash B NP-C 7:33 PM

## 2013-01-13 NOTE — Patient Instructions (Addendum)
Continue medications.  Have fun on your trip.  Follow up 1 month.

## 2013-01-18 ENCOUNTER — Other Ambulatory Visit: Payer: Self-pay | Admitting: Internal Medicine

## 2013-02-17 ENCOUNTER — Encounter (HOSPITAL_COMMUNITY): Payer: PRIVATE HEALTH INSURANCE

## 2013-02-23 ENCOUNTER — Encounter (INDEPENDENT_AMBULATORY_CARE_PROVIDER_SITE_OTHER): Payer: Self-pay

## 2013-02-23 ENCOUNTER — Ambulatory Visit (HOSPITAL_COMMUNITY)
Admission: RE | Admit: 2013-02-23 | Discharge: 2013-02-23 | Disposition: A | Discharge status: Home / Self Care | Payer: PRIVATE HEALTH INSURANCE | Source: Ambulatory Visit | Attending: Cardiology | Admitting: Cardiology

## 2013-02-23 VITALS — BP 126/72 | HR 85 | Wt 176.2 lb

## 2013-02-23 DIAGNOSIS — M545 Low back pain, unspecified: Secondary | ICD-10-CM | POA: Insufficient documentation

## 2013-02-23 DIAGNOSIS — I10 Essential (primary) hypertension: Secondary | ICD-10-CM

## 2013-02-23 DIAGNOSIS — E785 Hyperlipidemia, unspecified: Secondary | ICD-10-CM | POA: Insufficient documentation

## 2013-02-23 DIAGNOSIS — N400 Enlarged prostate without lower urinary tract symptoms: Secondary | ICD-10-CM | POA: Insufficient documentation

## 2013-02-23 DIAGNOSIS — I428 Other cardiomyopathies: Secondary | ICD-10-CM | POA: Insufficient documentation

## 2013-02-23 DIAGNOSIS — I5023 Acute on chronic systolic (congestive) heart failure: Secondary | ICD-10-CM

## 2013-02-23 DIAGNOSIS — I5022 Chronic systolic (congestive) heart failure: Secondary | ICD-10-CM | POA: Insufficient documentation

## 2013-02-23 DIAGNOSIS — F17200 Nicotine dependence, unspecified, uncomplicated: Secondary | ICD-10-CM | POA: Insufficient documentation

## 2013-02-23 DIAGNOSIS — I129 Hypertensive chronic kidney disease with stage 1 through stage 4 chronic kidney disease, or unspecified chronic kidney disease: Secondary | ICD-10-CM | POA: Insufficient documentation

## 2013-02-23 DIAGNOSIS — I872 Venous insufficiency (chronic) (peripheral): Secondary | ICD-10-CM | POA: Insufficient documentation

## 2013-02-23 DIAGNOSIS — N189 Chronic kidney disease, unspecified: Secondary | ICD-10-CM | POA: Insufficient documentation

## 2013-02-23 DIAGNOSIS — I82409 Acute embolism and thrombosis of unspecified deep veins of unspecified lower extremity: Secondary | ICD-10-CM

## 2013-02-23 DIAGNOSIS — K31819 Angiodysplasia of stomach and duodenum without bleeding: Secondary | ICD-10-CM | POA: Insufficient documentation

## 2013-02-23 DIAGNOSIS — Z79899 Other long term (current) drug therapy: Secondary | ICD-10-CM | POA: Insufficient documentation

## 2013-02-23 DIAGNOSIS — I2510 Atherosclerotic heart disease of native coronary artery without angina pectoris: Secondary | ICD-10-CM | POA: Insufficient documentation

## 2013-02-23 DIAGNOSIS — D649 Anemia, unspecified: Secondary | ICD-10-CM | POA: Insufficient documentation

## 2013-02-23 DIAGNOSIS — K219 Gastro-esophageal reflux disease without esophagitis: Secondary | ICD-10-CM | POA: Insufficient documentation

## 2013-02-23 LAB — BASIC METABOLIC PANEL
BUN: 19 mg/dL (ref 6–23)
Calcium: 9.6 mg/dL (ref 8.4–10.5)
Chloride: 101 mEq/L (ref 96–112)
GFR calc Af Amer: 51 mL/min — ABNORMAL LOW (ref >90)
GFR calc non Af Amer: 44 mL/min — ABNORMAL LOW (ref >90)
Potassium: 3.9 mEq/L (ref 3.5–5.1)
Sodium: 137 mEq/L (ref 135–145)

## 2013-02-23 LAB — DIGOXIN LEVEL: Digoxin Level: 0.9 ng/mL (ref 0.8–2.0)

## 2013-02-23 MED ORDER — METOPROLOL SUCCINATE ER 25 MG PO TB24: 12.5000 mg | ORAL_TABLET | Freq: Every day | ORAL | Status: DC

## 2013-02-23 MED ORDER — CLOBETASOL PROPIONATE 0.05 % EX CREA: 1.0000 "application " | TOPICAL_CREAM | Freq: Four times a day (QID) | CUTANEOUS | Status: DC | PRN

## 2013-02-23 NOTE — Patient Instructions (Signed)
Start Toprol 12.5 mg (1/2 tablet) at night Follow up in 2 months with Echo.  Your physician has requested that you have an echocardiogram. Echocardiography is a painless test that uses sound waves to create images of your heart. It provides your doctor with information about the size and shape of your heart and how well your heart's chambers and valves are working. This procedure takes approximately one hour. There are no restrictions for this procedure.  Take Metolazone 2.5 mg today.

## 2013-02-23 NOTE — Progress Notes (Signed)
Patient ID: YVES FODOR, male   DOB: [birth year], 67 y.o.   MRN: [id number]  PCP: Dr. Jeanelle Malling Osei-Bonsu Urologist: Dr Retta Diones  HPI:  [name] is a 67 year old male with chronic systolic CHF secondary to NICM echo EF 30-35%, HTN, anemia, chronic renal insufficiency,  UGI bleed,  duodenal AVMs,  rectal bleeding 04/2011,  DVTs (2004, 2005, 2007, 2010, 2011) however coumadin on hold due to GI bleed, IVC filter 2005, GERD, BPD, and chronic low back .  09/2011 S/P ORIF L ankle. Spironolactone switched to eplerenone due to gynecomastia.  Discharged 06/25/11  Weight 159 pounds.  RHC 2/13 RA = 4  RV = 32/6 (7)  PA = 45/15 (24)  PCW = 7  Fick cardiac output/index = 5.2/2.7  Thermo = 4.0/2.1  PVR = 3.4 Woods  FA sat = 94%  PA sat = 56%, 57%  Noninvasive BP : 125/92 (72)  Calculated SVR = 1040  08/13/11 CPX  RER 1.16 Peak VO2 18.6 ml/kg/min Predicted 61.4% VE/VCO2 slope 42.8 08/23/2011 EF 30-35%  Admitted from HF clinic 05/30/2012 with ~40-50 volume overload. Diuresed with Milrinone and Lasix. Diuresed 41 pounds. Milrinone and lasix gtt weaned off. CO-OX dropped 43. Beta blocker was stopped due to low output. He was offered home Milrinone however he declined.  Discharge weight 175 pounds.    Presented to [facility] 08/31/12 complaining of dysuria. Urinary retention noted. He followed up with Dr Retta Diones with recommendations for I/O catheter and  Cipro.    Follow up: Just returned from 3 week trip with marine friends fishing. He reports feeling great. Denies SOB, orthopnea, CP, or edema. Weight at home 173-175 lbs. Has not needed any metolazone. Walking up stairs no longer bothers him. Taking medications as prescribed. Drinking less than 2L a day and following low salt diet.  Labs  ROS: All systems negative except as listed in HPI, PMH and Problem List.  Past Medical History  Diagnosis Date  . Venous thromboembolism     DVTs in 2004, 2005, 2007, 8/10, 8/11. IVC filter 2005. Off coumadin 04/2011  secondary to rectal bleeding  . 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 Chronic venous insufficiency   . DM2 (diabetes mellitus, type 2)   . GERD (gastroesophageal reflux disease)   . BPH (benign prostatic hypertrophy)   . Low back pain   . HTN (hypertension)   . HLD (hyperlipidemia)   . NICM (nonischemic cardiomyopathy) 1. 9/11  2. 2/12    1. Echo septal and apical akinesis, dilated LV, EF 45%, RV normal size and systolic function, EF 43% on Myoview  2. Admitted with decompensated CHF, echo EF 30-35% with diffuse hypokinesis but akinesis of mid to apical anteroseptal wall and apex, moderate LVH, mild MR, grade I diastolic dysfunction, SPEP/UP negative and HIV negative. Most recent EF 10-15% 04/2011  . Smoking   . CAD (coronary artery disease) 1. 2007  2. 10/11    1. Left heart cath with 40-50% stenosis in small RCA EF 50%  2. Lexiscan myoview EF 43%, global hypokinesis, possible small area of apical ischemia; LHC no angiographic CAD, no LV-gram done due to CKD   2. No angiographic CAD by cath 02/2010  . CKD (chronic kidney disease) stage 2, GFR 60-89 ml/min as of 02/2011  . Headache(784.0)   . Diabetes mellitus   . Blood transfusion   . Anemia   . Syncope     1. in setting of presumed orthostatic  Hypotn 09/2011  . Right fibular fracture     1. 09/2011 - resulting from fall/syncope  . Closed right ankle fracture 10/10/2011    Current Outpatient Prescriptions  Medication Sig Dispense Refill  . atorvastatin (LIPITOR) 20 MG tablet Take 20 mg by mouth every morning.       . clobetasol cream (TEMOVATE) 0.05 % Apply 1 application topically 4 (four) times daily as needed (for rash.).      Marland Kitchen digoxin (DIGOX) 0.125 MG tablet Take 1 tablet (0.125 mg total) by mouth daily.  30 tablet  6  . eplerenone (INSPRA) 25 MG tablet Take 1 tablet (25 mg total) by mouth daily.      . famotidine (PEPCID) 20 MG tablet Take 1 tablet (20 mg total) by mouth daily.  20 tablet  0  . glipiZIDE  (GLUCOTROL XL) 5 MG 24 hr tablet Take 5 mg by mouth daily.       . hydrALAZINE (APRESOLINE) 10 MG tablet Take 1 tablet (10 mg total) by mouth 3 (three) times daily.  90 tablet  3  . isosorbide mononitrate (IMDUR) 30 MG 24 hr tablet Take 0.5 tablets (15 mg total) by mouth daily.  15 tablet  3  . meclizine (ANTIVERT) 25 MG tablet Take 25 mg by mouth 3 (three) times daily as needed. For vertigo      . metolazone (ZAROXOLYN) 2.5 MG tablet Take 1 tablet (2.5 mg total) by mouth as needed.  10 tablet  3  . metoprolol succinate (TOPROL-XL) 50 MG 24 hr tablet Take 50 mg by mouth daily. Take with or immediately following a meal.      . pantoprazole (PROTONIX) 40 MG tablet Take 1 tablet (40 mg total) by mouth daily.  30 tablet  4  . Tamsulosin HCl (FLOMAX) 0.4 MG CAPS Take 1 capsule (0.4 mg total) by mouth at bedtime.  90 capsule  1  . torsemide (DEMADEX) 20 MG tablet TAKE 1 TABLET BY MOUTH DAILY  30 tablet  6   No current facility-administered medications for this encounter.    Filed Vitals:   02/23/13 1348  BP: 126/72  Pulse: 85  Weight: 176 lb 4 oz (79.946 kg)  SpO2: 96%   PHYSICAL EXAM: General: Elderly.  No acute distress.  HEENT: normal except for poor dentition Neck: supple. JVP 7-8, prominent V waves Carotids 2+ bilaterally; no bruits. No lymphadenopathy or thryomegaly appreciated. Cor: PMI displaced laterally. Tachycardic regular rate and rhythm. +S3 Lungs: clear Abdomen: Non tender. Good bowel sounds. Mildly distended Extremities: no cyanosis, clubbing, rash, bilateral trace edema Neuro: alert & orientedx3, cranial nerves grossly intact. Moves all 4 extremities w/o difficulty. Affect pleasant.  ASSESSMENT & PLAN:  1) Chronic systolic HF, NICM, EF 30-35% (08/5407), mod/severe TR   - NYHA II symptoms. Volume status mildly elevated, will have patient take one dose of metolazone 2.5 mg today. Patient's weight is up 13 lbs, however do not believe it is all fluid.  - He is currently taking  Toprol XL 50 mg daily, will increase slightly to 50 mg in the am and 12.5 mg in pm. Instructed patient to call if he notices any increase in fatigue or dizziness. - Continue digoxin 0.125 mg daily and will check digoxin level today. - Continue eplerenone 25 mg daily, hydralazine 10 mg TID and IMDUR 15 mg. - Patient is not currently on ACE-I and not quite sure if he did not tolerate in the past. Will get BMET today and if stable can try  starting low dose next visit. - EF remains less than 35% and patient has been reluctant in the past to have ICD placed. Have discussed the risk of SCD with him once again and he would like to wait to see if EF has recovered. Will repeat ECHO in 2 months. Reinforced the need and importance of daily weights, a low sodium diet, and fluid restriction (less than 2 L a day). Instructed to call the HF clinic if weight increases more than 3 lbs overnight or 5 lbs in a week.  2) CRI stage 3 (baseline Cr 1.7-2.0) - Will get BMET today. 3) HTN - controlled on Toprol, hydralazine, IMDUR and eplerenone. As above will increase Toprol slightly to 50/25. 4) Recurrent DVTs - No signs or symptoms of DVT - Not on Coumadin d/t hx GIB  F/U 2 months with ECHO  [name] B NP-C 10:20 PM

## 2013-03-15 ENCOUNTER — Other Ambulatory Visit (HOSPITAL_COMMUNITY): Payer: Self-pay | Admitting: Internal Medicine

## 2013-04-06 ENCOUNTER — Other Ambulatory Visit (HOSPITAL_COMMUNITY): Payer: Self-pay | Admitting: Anesthesiology

## 2013-04-07 ENCOUNTER — Other Ambulatory Visit (HOSPITAL_COMMUNITY): Payer: Self-pay | Admitting: Internal Medicine

## 2013-04-12 ENCOUNTER — Other Ambulatory Visit (HOSPITAL_COMMUNITY): Payer: Self-pay | Admitting: Anesthesiology

## 2013-05-03 ENCOUNTER — Other Ambulatory Visit (HOSPITAL_COMMUNITY): Payer: Self-pay | Admitting: Anesthesiology

## 2013-05-05 ENCOUNTER — Other Ambulatory Visit (HOSPITAL_COMMUNITY): Payer: Self-pay | Admitting: Cardiology

## 2013-05-05 DIAGNOSIS — I509 Heart failure, unspecified: Secondary | ICD-10-CM

## 2013-05-09 ENCOUNTER — Ambulatory Visit (HOSPITAL_BASED_OUTPATIENT_CLINIC_OR_DEPARTMENT_OTHER)
Admission: RE | Admit: 2013-05-09 | Discharge: 2013-05-09 | Disposition: A | Discharge status: Home / Self Care | Payer: PRIVATE HEALTH INSURANCE | Source: Ambulatory Visit | Attending: Internal Medicine | Admitting: Internal Medicine

## 2013-05-09 ENCOUNTER — Ambulatory Visit (HOSPITAL_COMMUNITY)
Admission: RE | Admit: 2013-05-09 | Discharge: 2013-05-09 | Disposition: A | Discharge status: Home / Self Care | Payer: PRIVATE HEALTH INSURANCE | Source: Ambulatory Visit | Attending: Family Medicine | Admitting: Family Medicine

## 2013-05-09 VITALS — BP 130/78 | HR 84 | Wt 177.5 lb

## 2013-05-09 DIAGNOSIS — I369 Nonrheumatic tricuspid valve disorder, unspecified: Secondary | ICD-10-CM

## 2013-05-09 DIAGNOSIS — R0989 Other specified symptoms and signs involving the circulatory and respiratory systems: Secondary | ICD-10-CM | POA: Insufficient documentation

## 2013-05-09 DIAGNOSIS — E119 Type 2 diabetes mellitus without complications: Secondary | ICD-10-CM | POA: Insufficient documentation

## 2013-05-09 DIAGNOSIS — I5023 Acute on chronic systolic (congestive) heart failure: Secondary | ICD-10-CM

## 2013-05-09 DIAGNOSIS — I5022 Chronic systolic (congestive) heart failure: Secondary | ICD-10-CM

## 2013-05-09 DIAGNOSIS — R0609 Other forms of dyspnea: Secondary | ICD-10-CM | POA: Insufficient documentation

## 2013-05-09 DIAGNOSIS — I509 Heart failure, unspecified: Secondary | ICD-10-CM | POA: Insufficient documentation

## 2013-05-09 DIAGNOSIS — M109 Gout, unspecified: Secondary | ICD-10-CM

## 2013-05-09 DIAGNOSIS — I2510 Atherosclerotic heart disease of native coronary artery without angina pectoris: Secondary | ICD-10-CM | POA: Insufficient documentation

## 2013-05-09 DIAGNOSIS — R55 Syncope and collapse: Secondary | ICD-10-CM | POA: Insufficient documentation

## 2013-05-09 DIAGNOSIS — I10 Essential (primary) hypertension: Secondary | ICD-10-CM | POA: Insufficient documentation

## 2013-05-09 LAB — BASIC METABOLIC PANEL
BUN: 17 mg/dL (ref 6–23)
Calcium: 9.4 mg/dL (ref 8.4–10.5)
Chloride: 100 mEq/L (ref 96–112)
GFR calc Af Amer: 53 mL/min — ABNORMAL LOW (ref >90)
GFR calc non Af Amer: 46 mL/min — ABNORMAL LOW (ref >90)
Potassium: 3.8 mEq/L (ref 3.5–5.1)
Sodium: 137 mEq/L (ref 135–145)

## 2013-05-09 LAB — URIC ACID: Uric Acid, Serum: 9 mg/dL — ABNORMAL HIGH (ref 4.0–7.8)

## 2013-05-09 MED ORDER — ALLOPURINOL 100 MG PO TABS: 100.0000 mg | ORAL_TABLET | Freq: Every day | ORAL | Status: DC

## 2013-05-09 MED ORDER — PREDNISONE 10 MG PO TABS: 40.0000 mg | ORAL_TABLET | Freq: Every day | ORAL | Status: DC

## 2013-05-09 MED ORDER — PREDNISONE 20 MG PO TABS: 40.0000 mg | ORAL_TABLET | Freq: Every day | ORAL | Status: DC

## 2013-05-09 NOTE — Patient Instructions (Addendum)
Follow up in 2 months   Take 40 mg prednisone daily for 3 days  Take allopurinol 100 mg daily   Do the following things EVERYDAY: 1) Weigh yourself in the morning before breakfast. Write it down and keep it in a log. 2) Take your medicines as prescribed 3) Eat low salt foods-Limit salt (sodium) to 2000 mg per day.  4) Stay as active as you can everyday 5) Limit all fluids for the day to less than 2 liters

## 2013-05-09 NOTE — Progress Notes (Signed)
Patient ID: [name], male   DOB: 18-Mar-1946, 67 y.o.   MRN: [id number]  PCP: Dr. Jeanelle Malling Osei-Bonsu Urologist: Dr Retta Diones  HPI:   Mr. Candy is a 67 year old male with chronic systolic CHF secondary to NICM echo EF 30-35%, HTN, anemia, chronic renal insufficiency, UGI bleed, duodenal AVMs,  rectal bleeding 04/2011,  DVTs (2004, 2005, 2007, 2010, 2011) however coumadin on hold due to GI bleed, IVC filter 2005, GERD, BPD, and chronic low back.  09/2011 S/P ORIF L ankle. Spironolactone switched to eplerenone due to gynecomastia.  Discharged 06/25/11  Weight 159 pounds.  RHC 2/13 RA = 4  RV = 32/6 (7)  PA = 45/15 (24)  PCW = 7  Fick cardiac output/index = 5.2/2.7  Thermo = 4.0/2.1  PVR = 3.4 Woods  FA sat = 94%  PA sat = 56%, 57%  Noninvasive BP : 125/92 (72)  Calculated SVR = 1040  08/13/11 CPX  RER 1.16 Peak VO2 18.6 ml/kg/min Predicted 61.4% VE/VCO2 slope 42.8 08/23/2011 EF 30-35%  Admitted from HF clinic 05/30/2012 with volume overload. Diuresed with Milrinone and Lasix. Diuresed 41 pounds. Milrinone and lasix gtt weaned off. CO-OX dropped 43. Beta blocker was stopped due to low output. He was offered home Milrinone however he declined.  Discharge weight 175 pounds.    Presented to [facility] 08/31/12 complaining of dysuria. Urinary retention noted. He followed up with Dr Retta Diones with recommendations for I/O catheter and cipro.    He returns for follow up. Last visit Toprol XL increased to 50 mg in am 12.5 mg in pm added. Complaining L ankle pain from his gout. Says he can do ADLs without too much problem but having a hard time walking due to gout pain.  Denies edema/PND/Orthopnea. + Dyspnea/fatigue with mild to moderate exertion. Weight at home 166-167 pounds. Compliant with medications. Previously has refused ICD but now he says he is interested in a device.   ECHO 05/09/13 EF 15% RV mod-severely hypokinetic  Labs 02/23/13 K 3.9 Creatinine 1.58 Dig level 0.9    ROS: All systems  negative except as listed in HPI, PMH and Problem List.  Past Medical History  Diagnosis Date  . Venous thromboembolism     DVTs in 2004, 2005, 2007, 8/10, 8/11. IVC filter 2005. Off coumadin 04/2011 secondary to rectal bleeding  . 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 Chronic venous insufficiency   . DM2 (diabetes mellitus, type 2)   . GERD (gastroesophageal reflux disease)   . BPH (benign prostatic hypertrophy)   . Low back pain   . HTN (hypertension)   . HLD (hyperlipidemia)   . NICM (nonischemic cardiomyopathy) 1. 9/11  2. 2/12    1. Echo septal and apical akinesis, dilated LV, EF 45%, RV normal size and systolic function, EF 43% on Myoview  2. Admitted with decompensated CHF, echo EF 30-35% with diffuse hypokinesis but akinesis of mid to apical anteroseptal wall and apex, moderate LVH, mild MR, grade I diastolic dysfunction, SPEP/UP negative and HIV negative. Most recent EF 10-15% 04/2011  . Smoking   . CAD (coronary artery disease) 1. 2007  2. 10/11    1. Left heart cath with 40-50% stenosis in small RCA EF 50%  2. Lexiscan myoview EF 43%, global hypokinesis, possible small area of apical ischemia; LHC no angiographic CAD, no LV-gram done due to CKD   2. No angiographic CAD by cath 02/2010  . CKD (chronic kidney disease)  stage 2, GFR 60-89 ml/min as of 02/2011  . Headache(784.0)   . Diabetes mellitus   . Blood transfusion   . Anemia   . Syncope     1. in setting of presumed orthostatic Hypotn 09/2011  . Right fibular fracture     1. 09/2011 - resulting from fall/syncope  . Closed right ankle fracture 10/10/2011    Current Outpatient Prescriptions  Medication Sig Dispense Refill  . atorvastatin (LIPITOR) 20 MG tablet Take 20 mg by mouth every morning.       . clobetasol cream (TEMOVATE) 0.05 % Apply 1 application topically 4 (four) times daily as needed (for rash.).  30 g  0  . digoxin (DIGOX) 0.125 MG tablet Take 1 tablet (0.125 mg total) by mouth  daily.  30 tablet  6  . eplerenone (INSPRA) 25 MG tablet Take 1 tablet (25 mg total) by mouth daily.      . famotidine (PEPCID) 20 MG tablet Take 1 tablet (20 mg total) by mouth daily.  20 tablet  0  . glipiZIDE (GLUCOTROL XL) 5 MG 24 hr tablet Take 5 mg by mouth daily.       . hydrALAZINE (APRESOLINE) 10 MG tablet Take 1 tablet (10 mg total) by mouth 3 (three) times daily.  90 tablet  3  . isosorbide mononitrate (IMDUR) 30 MG 24 hr tablet Take 0.5 tablets (15 mg total) by mouth daily.  15 tablet  3  . meclizine (ANTIVERT) 25 MG tablet Take 25 mg by mouth 3 (three) times daily as needed. For vertigo      . metolazone (ZAROXOLYN) 2.5 MG tablet Take 1 tablet (2.5 mg total) by mouth as needed.  10 tablet  3  . metoprolol succinate (TOPROL-XL) 25 MG 24 hr tablet Take 12.5 mg by mouth daily.      . pantoprazole (PROTONIX) 40 MG tablet Take 1 tablet (40 mg total) by mouth daily.  30 tablet  4  . Tamsulosin HCl (FLOMAX) 0.4 MG CAPS Take 1 capsule (0.4 mg total) by mouth at bedtime.  90 capsule  1  . torsemide (DEMADEX) 20 MG tablet TAKE 1 TABLET BY MOUTH DAILY  30 tablet  6   No current facility-administered medications for this encounter.    Filed Vitals:   05/09/13 1348  BP: 130/78  Pulse: 84  Weight: 177 lb 8 oz (80.513 kg)  SpO2: 98%   PHYSICAL EXAM: General: Elderly.  No acute distress.  HEENT: normal except for poor dentition Neck: supple. JVP 7-8, prominent V waves Carotids 2+ bilaterally; no bruits. No lymphadenopathy or thryomegaly appreciated. Cor: PMI displaced laterally. Tachycardic regular rate and rhythm. +S3 Lungs: clear Abdomen: Non tender. Good bowel sounds. Mildly distended Extremities: no cyanosis, clubbing, rash, bilateral trace edema Neuro: alert & orientedx3, cranial nerves grossly intact. Moves all 4 extremities w/o difficulty. Affect pleasant.  EKG: NSR 74 bpm   ASSESSMENT & PLAN:  1) Chronic systolic HF, NICM, EF 30-35% (09/4096) but today EF 15-20% . Dr  Gala Romney reviewed and discussed ECHO.  - NYHA II-III symptoms functionally he doing well. Volume status stable. Continue 20 mg of torsemide and  eplerenone 25 mg daily. (no on spiro due to gynecomastia) - Continue Torpol 50 mg in the am and 12.5 mg in pm.and digoxin 0.125 mg  -Continue  hydralazine 10 mg TID and IMDUR 15 mg. - Patient is not currently on ACE-I and not quite sure if he did not tolerate in the past.  Check BMET today  Refer to EP for ICD as noted EF 15-20%. EKG today QRS 86  - Reinforced the need and importance of daily weights, a low sodium diet, and fluid restriction (less than 2 L a day). Instructed to call the HF clinic if weight increases more than 3 lbs overnight or 5 lbs in a week.   2) CRI stage 3 (baseline Cr 1.7-2.0) Check BMET  3) Recurrent DVTs - No signs or symptoms of DVT - Not on Coumadin d/t hx GIB and he refuses all antocoagulants  4) L ankle pain- likely acute gout. check uric acid . If uric acid elevated Will give allopurinol 100 mg daily and a short course of prednisone 40 mg daily for 3 days.   Follow up in 8 weeks.   CLEGG,AMY NP-C 2:03 PM  Patient seen and examined with [name], NP. We discussed all aspects of the encounter. I agree with the assessment and plan as stated above.   Overall he is fairly stable from HF standpoint, if not somewhat improved. That said he is still quite tenuous. Previous CPX and RHC c/w advanced HF. He is also now interested in ICD, which he refused previously. Will refer to EP. Check labs today, if renal function and potassium OK may try low dose ACE/ARB, however he became quite hypotensive with these in the past and had a fall with injury. Agree with treatment of gout.   Reuel Boom Bensimhon,MD 3:42 PM

## 2013-05-18 DIAGNOSIS — M109 Gout, unspecified: Secondary | ICD-10-CM | POA: Insufficient documentation

## 2013-05-20 ENCOUNTER — Other Ambulatory Visit: Payer: Self-pay

## 2013-05-20 ENCOUNTER — Other Ambulatory Visit (HOSPITAL_COMMUNITY): Payer: Self-pay

## 2013-05-20 ENCOUNTER — Emergency Department (HOSPITAL_COMMUNITY)
Admission: EM | Admit: 2013-05-20 | Discharge: 2013-05-20 | Disposition: A | Discharge status: Home / Self Care | Payer: PRIVATE HEALTH INSURANCE | Attending: Emergency Medicine | Admitting: Emergency Medicine

## 2013-05-20 ENCOUNTER — Telehealth (HOSPITAL_COMMUNITY): Payer: Self-pay

## 2013-05-20 ENCOUNTER — Encounter (HOSPITAL_COMMUNITY): Payer: Self-pay | Admitting: Emergency Medicine

## 2013-05-20 DIAGNOSIS — I2510 Atherosclerotic heart disease of native coronary artery without angina pectoris: Secondary | ICD-10-CM | POA: Insufficient documentation

## 2013-05-20 DIAGNOSIS — IMO0002 Reserved for concepts with insufficient information to code with codable children: Secondary | ICD-10-CM | POA: Insufficient documentation

## 2013-05-20 DIAGNOSIS — F17200 Nicotine dependence, unspecified, uncomplicated: Secondary | ICD-10-CM | POA: Insufficient documentation

## 2013-05-20 DIAGNOSIS — Z95818 Presence of other cardiac implants and grafts: Secondary | ICD-10-CM | POA: Insufficient documentation

## 2013-05-20 DIAGNOSIS — K219 Gastro-esophageal reflux disease without esophagitis: Secondary | ICD-10-CM | POA: Insufficient documentation

## 2013-05-20 DIAGNOSIS — E119 Type 2 diabetes mellitus without complications: Secondary | ICD-10-CM | POA: Insufficient documentation

## 2013-05-20 DIAGNOSIS — R079 Chest pain, unspecified: Secondary | ICD-10-CM | POA: Insufficient documentation

## 2013-05-20 DIAGNOSIS — Z86718 Personal history of other venous thrombosis and embolism: Secondary | ICD-10-CM | POA: Insufficient documentation

## 2013-05-20 DIAGNOSIS — Z79899 Other long term (current) drug therapy: Secondary | ICD-10-CM | POA: Insufficient documentation

## 2013-05-20 DIAGNOSIS — N400 Enlarged prostate without lower urinary tract symptoms: Secondary | ICD-10-CM | POA: Insufficient documentation

## 2013-05-20 DIAGNOSIS — E785 Hyperlipidemia, unspecified: Secondary | ICD-10-CM | POA: Insufficient documentation

## 2013-05-20 DIAGNOSIS — R000 Tachycardia, unspecified: Secondary | ICD-10-CM | POA: Insufficient documentation

## 2013-05-20 DIAGNOSIS — N390 Urinary tract infection, site not specified: Secondary | ICD-10-CM | POA: Insufficient documentation

## 2013-05-20 DIAGNOSIS — Z862 Personal history of diseases of the blood and blood-forming organs and certain disorders involving the immune mechanism: Secondary | ICD-10-CM | POA: Insufficient documentation

## 2013-05-20 DIAGNOSIS — Q279 Congenital malformation of peripheral vascular system, unspecified: Secondary | ICD-10-CM | POA: Insufficient documentation

## 2013-05-20 DIAGNOSIS — N182 Chronic kidney disease, stage 2 (mild): Secondary | ICD-10-CM | POA: Insufficient documentation

## 2013-05-20 DIAGNOSIS — I129 Hypertensive chronic kidney disease with stage 1 through stage 4 chronic kidney disease, or unspecified chronic kidney disease: Secondary | ICD-10-CM | POA: Insufficient documentation

## 2013-05-20 DIAGNOSIS — Z8781 Personal history of (healed) traumatic fracture: Secondary | ICD-10-CM | POA: Insufficient documentation

## 2013-05-20 LAB — URINALYSIS, ROUTINE W REFLEX MICROSCOPIC
Bilirubin Urine: NEGATIVE
Glucose, UA: 100 mg/dL — AB
Ketones, ur: NEGATIVE mg/dL
NITRITE: NEGATIVE
PH: 5.5 (ref 5.0–8.0)
Protein, ur: NEGATIVE mg/dL
Specific Gravity, Urine: 1.014 (ref 1.005–1.030)
Urobilinogen, UA: 0.2 mg/dL (ref 0.0–1.0)

## 2013-05-20 LAB — CBC WITH DIFFERENTIAL/PLATELET
Basophils Absolute: 0 10*3/uL (ref 0.0–0.1)
Basophils Relative: 0 % (ref 0–1)
EOS ABS: 0.1 10*3/uL (ref 0.0–0.7)
EOS PCT: 2 % (ref 0–5)
HEMATOCRIT: 49.5 % (ref 39.0–52.0)
Hemoglobin: 16.4 g/dL (ref 13.0–17.0)
LYMPHS ABS: 0.8 10*3/uL (ref 0.7–4.0)
Lymphocytes Relative: 15 % (ref 12–46)
MCH: 29 pg (ref 26.0–34.0)
MCHC: 33.1 g/dL (ref 30.0–36.0)
MCV: 87.5 fL (ref 78.0–100.0)
Monocytes Absolute: 0.5 10*3/uL (ref 0.1–1.0)
Monocytes Relative: 10 % (ref 3–12)
Neutro Abs: 3.8 10*3/uL (ref 1.7–7.7)
Neutrophils Relative %: 73 % (ref 43–77)
PLATELETS: 126 10*3/uL — AB (ref 150–400)
RBC: 5.66 MIL/uL (ref 4.22–5.81)
RDW: 14.7 % (ref 11.5–15.5)
WBC: 5.2 10*3/uL (ref 4.0–10.5)

## 2013-05-20 LAB — POCT I-STAT TROPONIN I
TROPONIN I, POC: 0.02 ng/mL (ref 0.00–0.08)
Troponin i, poc: 0.08 ng/mL (ref 0.00–0.08)

## 2013-05-20 LAB — COMPREHENSIVE METABOLIC PANEL
ALT: 52 U/L (ref 0–53)
AST: 39 U/L — ABNORMAL HIGH (ref 0–37)
Albumin: 3.9 g/dL (ref 3.5–5.2)
Alkaline Phosphatase: 84 U/L (ref 39–117)
BUN: 21 mg/dL (ref 6–23)
CO2: 30 mEq/L (ref 19–32)
Calcium: 9.1 mg/dL (ref 8.4–10.5)
Chloride: 95 mEq/L — ABNORMAL LOW (ref 96–112)
Creatinine, Ser: 1.88 mg/dL — ABNORMAL HIGH (ref 0.50–1.35)
GFR calc non Af Amer: 36 mL/min — ABNORMAL LOW (ref >90)
GFR, EST AFRICAN AMERICAN: 42 mL/min — AB (ref >90)
Glucose, Bld: 295 mg/dL — ABNORMAL HIGH (ref 70–99)
Potassium: 3.8 mEq/L (ref 3.7–5.3)
SODIUM: 137 meq/L (ref 137–147)
TOTAL PROTEIN: 7.9 g/dL (ref 6.0–8.3)
Total Bilirubin: 0.8 mg/dL (ref 0.3–1.2)

## 2013-05-20 LAB — OCCULT BLOOD, POC DEVICE: Fecal Occult Bld: NEGATIVE

## 2013-05-20 LAB — PROTIME-INR
INR: 1.06 (ref 0.00–1.49)
Prothrombin Time: 13.6 seconds (ref 11.6–15.2)

## 2013-05-20 LAB — URINE MICROSCOPIC-ADD ON

## 2013-05-20 LAB — APTT: aPTT: 28 seconds (ref 24–37)

## 2013-05-20 MED ORDER — PREDNISONE 20 MG PO TABS: 40.0000 mg | ORAL_TABLET | Freq: Every day | ORAL | Status: DC

## 2013-05-20 MED ORDER — CEPHALEXIN 500 MG PO CAPS: 1000.0000 mg | ORAL_CAPSULE | Freq: Two times a day (BID) | ORAL | Status: DC

## 2013-05-20 MED ORDER — ALLOPURINOL 100 MG PO TABS: 300.0000 mg | ORAL_TABLET | Freq: Every day | ORAL | Status: DC

## 2013-05-20 NOTE — ED Notes (Signed)
Pt states that he has to be out of here by 11 or the place he lives at won't be able to pick him up.

## 2013-05-20 NOTE — ED Provider Notes (Signed)
Pt received from Dr. Anitra Lauth.  Pt has a non-ischemic cardiomyopathy and most recent Cardiac cath in 02/2010 showed 40-50% stenosis of RCA, otherwise unremarkable.  Pt presented to ED today w/ c/o CP that occurred in setting of anxiety related to seeing what I believed to be blood in his stool.  No gross blood in rectum on exam and hemoccult neg.  There is however blood in urine and evidence of UTI.  Patient may have somehow misinterpreted origin of blood.  Will treat w/ keflex for UTI and he will f/u with PCP for f/u rectal examination.  Initial troponin 0.08 and delta 0.02.  Consulted cardiologist on call and he is comfortable with patient being discharged home.  Troponin leak likely d/t CHF.  All test results have been discussed w/ patient.  Advised return for syncope, SOB, worsening CP, rectal bleeding.  Stable at time of discharge. 10:54 PM   Otilio Miu, PA-C 05/20/13 [phone]

## 2013-05-20 NOTE — Telephone Encounter (Signed)
Pt. Called c/o continued gout pain in L foot despite 3 day dose of 40 mg prednisone and new Rx of allopurinol 100mg  daily.  Per Karie Mainland, pt will take another 3 day dose of 40mg  prednisone PO and will increase allopurinol to 300 mg daily.  Rx refills sent to [facility], pt aware without further questions or concerns.  Instructed to call back to clinic if s/s do not improve or pt has further questions/concerns. Ave Filter

## 2013-05-20 NOTE — ED Provider Notes (Signed)
CSN: [id number]     Arrival date & time 05/20/13  1849 History   First MD Initiated Contact with Patient 05/20/13 1853     Chief Complaint  Patient presents with  . Rectal Bleeding   (Consider location/radiation/quality/duration/timing/severity/associated sxs/prior Treatment) Patient is a 68 y.o. male presenting with hematochezia. The history is provided by the patient.  Rectal Bleeding Quality:  Bright red Amount:  Scant Duration: 30 min. Timing:  Intermittent Progression:  Resolved Chronicity:  New Context comment:  States he went to the bathroom and noticed stool looked dark and then went a second time and noticed bright red blood on toilet paper Similar prior episodes: yes (only once with elevated coumadin level over 1 year ago)   Relieved by:  None tried Worsened by:  Nothing tried Ineffective treatments:  None tried Associated symptoms: no abdominal pain, no dizziness, no hematemesis, no light-headedness, no loss of consciousness, no recent illness and no vomiting   Associated symptoms comment:  States after he saw the blood in his stool he became very anxious and then felt chest pressure that resolved in .  No SOB, dizziness or syncope with sx. Risk factors: NSAID use   Risk factors: no anticoagulant use, no liver disease and no steroid use   Risk factors comment:  Currently has been on NSAIDs for the last 1 week for gout   Past Medical History  Diagnosis Date  . Venous thromboembolism     DVTs in 2004, 2005, 2007, 8/10, 8/11. IVC filter 2005. Off coumadin 04/2011 secondary to rectal bleeding  . 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 Chronic venous insufficiency   . DM2 (diabetes mellitus, type 2)   . GERD (gastroesophageal reflux disease)   . BPH (benign prostatic hypertrophy)   . Low back pain   . HTN (hypertension)   . HLD (hyperlipidemia)   . NICM (nonischemic cardiomyopathy) 1. 9/11  2. 2/12    1. Echo septal and apical akinesis,  dilated LV, EF 45%, RV normal size and systolic function, EF 43% on Myoview  2. Admitted with decompensated CHF, echo EF 30-35% with diffuse hypokinesis but akinesis of mid to apical anteroseptal wall and apex, moderate LVH, mild MR, grade I diastolic dysfunction, SPEP/UP negative and HIV negative. Most recent EF 10-15% 04/2011  . Smoking   . CAD (coronary artery disease) 1. 2007  2. 10/11    1. Left heart cath with 40-50% stenosis in small RCA EF 50%  2. Lexiscan myoview EF 43%, global hypokinesis, possible small area of apical ischemia; LHC no angiographic CAD, no LV-gram done due to CKD   2. No angiographic CAD by cath 02/2010  . CKD (chronic kidney disease) stage 2, GFR 60-89 ml/min as of 02/2011  . Headache(784.0)   . Diabetes mellitus   . Blood transfusion   . Anemia   . Syncope     1. in setting of presumed orthostatic Hypotn 09/2011  . Right fibular fracture     1. 09/2011 - resulting from fall/syncope  . Closed right ankle fracture 10/10/2011   Past Surgical History  Procedure Laterality Date  . Cardiac catheterization    . Appendectomy    . Cholecystectomy    . Colonoscopy  05/01/2011    Procedure: COLONOSCOPY;  Surgeon: Louis Meckel, MD;  Location: [facility] ENDOSCOPY;  Service: Endoscopy;  Laterality: N/A;  . Orif ankle fracture  10/10/2011    Procedure: OPEN REDUCTION INTERNAL FIXATION (ORIF) ANKLE FRACTURE;  Surgeon: Eulas Post, MD;  Location: [facility] OR;  Service: Orthopedics;  Laterality: Right;  Right  Ankle Fracture ORIF   Family History  Problem Relation Age of Onset  . Coronary artery disease Neg Hx     Premature   History  Substance Use Topics  . Smoking status: Current Some Day Smoker -- 1.00 packs/day for 40 years    Types: Cigarettes    Last Attempt to Quit: 06/30/2011  . Smokeless tobacco: Never Used  . Alcohol Use: No    Review of Systems  Respiratory: Positive for chest tightness. Negative for cough and shortness of breath.   Gastrointestinal: Positive for  hematochezia. Negative for vomiting, abdominal pain and hematemesis.  Neurological: Negative for dizziness, loss of consciousness and light-headedness.  All other systems reviewed and are negative.    Allergies  Review of patient's allergies indicates no known allergies.  Home Medications   Current Outpatient Rx  Name  Route  Sig  Dispense  Refill  . allopurinol (ZYLOPRIM) 100 MG tablet   Oral   Take 3 tablets (300 mg total) by mouth daily. Take 3 tablets (300 mg) by mouth daily   90 tablet   6   . atorvastatin (LIPITOR) 20 MG tablet   Oral   Take 20 mg by mouth every morning.          . clobetasol cream (TEMOVATE) 0.05 %   Topical   Apply 1 application topically 4 (four) times daily as needed (for rash.).   30 g   0   . digoxin (DIGOX) 0.125 MG tablet   Oral   Take 1 tablet (0.125 mg total) by mouth daily.   30 tablet   6   . eplerenone (INSPRA) 25 MG tablet   Oral   Take 1 tablet (25 mg total) by mouth daily.         . famotidine (PEPCID) 20 MG tablet   Oral   Take 1 tablet (20 mg total) by mouth daily.   20 tablet   0   . glipiZIDE (GLUCOTROL XL) 5 MG 24 hr tablet   Oral   Take 5 mg by mouth daily.          . hydrALAZINE (APRESOLINE) 10 MG tablet   Oral   Take 1 tablet (10 mg total) by mouth 3 (three) times daily.   90 tablet   3   . isosorbide mononitrate (IMDUR) 30 MG 24 hr tablet   Oral   Take 0.5 tablets (15 mg total) by mouth daily.   15 tablet   3   . meclizine (ANTIVERT) 25 MG tablet   Oral   Take 25 mg by mouth 3 (three) times daily as needed. For vertigo         . metolazone (ZAROXOLYN) 2.5 MG tablet   Oral   Take 1 tablet (2.5 mg total) by mouth as needed.   10 tablet   3   . metoprolol succinate (TOPROL-XL) 25 MG 24 hr tablet   Oral   Take 12.5 mg by mouth daily at 10 pm.          . metoprolol succinate (TOPROL-XL) 50 MG 24 hr tablet   Oral   Take 50 mg by mouth daily. Take with or immediately following a meal.          . pantoprazole (PROTONIX) 40 MG tablet   Oral   Take 1 tablet (40 mg total) by mouth daily.   30 tablet  4   . predniSONE (DELTASONE) 20 MG tablet   Oral   Take 2 tablets (40 mg total) by mouth daily with breakfast.   6 tablet   0   . Tamsulosin HCl (FLOMAX) 0.4 MG CAPS   Oral   Take 1 capsule (0.4 mg total) by mouth at bedtime.   90 capsule   1   . torsemide (DEMADEX) 20 MG tablet      TAKE 1 TABLET BY MOUTH DAILY   30 tablet   6    BP 135/86  Pulse 103  Temp(Src) 98.3 F (36.8 C) (Oral)  Resp 22  SpO2 98% Physical Exam  Nursing note and vitals reviewed. Constitutional: He is oriented to person, place, and time. He appears well-developed and well-nourished. No distress.  HENT:  Head: Normocephalic and atraumatic.  Mouth/Throat: Oropharynx is clear and moist.  Eyes: Conjunctivae and EOM are normal. Pupils are equal, round, and reactive to light.  Neck: Normal range of motion. Neck supple.  Cardiovascular: Regular rhythm and intact distal pulses.  Tachycardia present.   No murmur heard. Pulmonary/Chest: Effort normal and breath sounds normal. No respiratory distress. He has no wheezes. He has no rales.  Abdominal: Soft. He exhibits no distension. There is no tenderness. There is no rebound and no guarding.  Genitourinary: Prostate normal. Rectal exam shows no external hemorrhoid, no internal hemorrhoid, no fissure and no tenderness. Guaiac negative stool.  Stool color is light brown  Musculoskeletal: Normal range of motion. He exhibits no edema and no tenderness.  Neurological: He is alert and oriented to person, place, and time.  Skin: Skin is warm and dry. No rash noted. No erythema.  Psychiatric: He has a normal mood and affect. His behavior is normal.    ED Course  Procedures (including critical care time) Labs Review Labs Reviewed  CBC WITH DIFFERENTIAL - Abnormal; Notable for the following:    Platelets 126 (*)    All other components within normal  limits  COMPREHENSIVE METABOLIC PANEL - Abnormal; Notable for the following:    Chloride 95 (*)    Glucose, Bld 295 (*)    Creatinine, Ser 1.88 (*)    AST 39 (*)    GFR calc non Af Amer 36 (*)    GFR calc Af Amer 42 (*)    All other components within normal limits  URINALYSIS, ROUTINE W REFLEX MICROSCOPIC - Abnormal; Notable for the following:    APPearance CLOUDY (*)    Glucose, UA 100 (*)    Hgb urine dipstick SMALL (*)    Leukocytes, UA MODERATE (*)    All other components within normal limits  URINE MICROSCOPIC-ADD ON - Abnormal; Notable for the following:    Bacteria, UA MANY (*)    Casts HYALINE CASTS (*)    All other components within normal limits  URINE CULTURE  PROTIME-INR  APTT  OCCULT BLOOD X 1 CARD TO LAB, STOOL  OCCULT BLOOD, POC DEVICE  POCT I-STAT TROPONIN I   Imaging Review No results found.  EKG Interpretation   None      Date: 05/20/2013  Rate: 103  Rhythm: sinus tachycardia  QRS Axis: normal  Intervals: normal  ST/T Wave abnormalities: t-wave inversion laterally  Conduction Disutrbances:none  Narrative Interpretation:   Old EKG Reviewed: unchanged    MDM  No diagnosis found.  Patient with an episode of dark stool and then bright red rectal bleeding that started tonight around 6:30. After he sat up when he became  very anxious and states that he had chest tightness and shortness of breath for a short period of time which is now resolved. He has had rectal bleeding once in the past when he had a supratherapeutic INR from taking Coumadin but states has not had any problems since then.  Per notes he had AV malformation in duodenum.  Patient has been taking medication for gout for the last one week which she states is helping with his gout pain and it appears to be an NSAID based on name however he denies abdominal pain, nausea or vomiting. Rectal exam without any sign of external hemorrhoids or fissures. Stool is light in color Hemoccult negative.  CP  atypical today that started after he became anxious when seeing blood in his stool.  He denies any pain now and no other associated sx.  However pt does have hx of ischemic cardiomyopathy and scheduled for pacemaker next month with hx of CAD with diffuse disease but no stenting.  EKG unchanged.  CBC, CMP, UA, INR, troponin and CXR pending.  Initial troponin neg.  CBC wnl.  CMP, INR pending and UA with findings concerning for UTI.  No blood on hemoccult and no evidence of bleeding on exam.  Given cardiac hx feel pt needs a delta troponin and then will discuss with cards.  Checked out with PA at 2000.   Gwyneth SproutWhitney Vestal Crandall, MD 05/20/13 2137

## 2013-05-20 NOTE — ED Notes (Signed)
Pt went to pharmacy this afternoon and had a BM while he was there. Pt had dark, hard schools there. Pt then had another BM of dark, hard stool. When he stood up he felt the need to relieve himself and had a BM of straight red blood. BP stable. A&O. 20 g L Hand No pain. Pt felt anxious and had chest pressure when he saw the blood. 12 lead unremarkable. Hx of GI bleed and transfusions. CHF, Afib (not currently on blood thinners).  Marland Kitchen

## 2013-05-20 NOTE — Discharge Instructions (Signed)
Take antibiotic as prescribed for bladder infection.  Follow up with your cardiologist.  Follow up with your primary care doctor to have your bottom rechecked for bleeding.  Return to the ER immediately if your chest pain returns, you develop difficulty breathing, have heavy bleeding from rectum or have a passing out spell.

## 2013-05-21 NOTE — ED Provider Notes (Signed)
Medical screening examination/treatment/procedure(s) were conducted as a shared visit with non-physician practitioner(s) and myself.  I personally evaluated the patient during the encounter.  EKG Interpretation   None         Gwyneth Sprout, MD 05/21/13 1103

## 2013-05-24 LAB — URINE CULTURE

## 2013-05-31 ENCOUNTER — Ambulatory Visit (INDEPENDENT_AMBULATORY_CARE_PROVIDER_SITE_OTHER): Payer: PRIVATE HEALTH INSURANCE | Admitting: Internal Medicine

## 2013-05-31 ENCOUNTER — Encounter: Payer: Self-pay | Admitting: *Deleted

## 2013-05-31 ENCOUNTER — Encounter: Payer: Self-pay | Admitting: Internal Medicine

## 2013-05-31 VITALS — Ht 70.0 in | Wt 168.0 lb

## 2013-05-31 DIAGNOSIS — I519 Heart disease, unspecified: Secondary | ICD-10-CM

## 2013-05-31 DIAGNOSIS — I5022 Chronic systolic (congestive) heart failure: Secondary | ICD-10-CM

## 2013-05-31 DIAGNOSIS — I10 Essential (primary) hypertension: Secondary | ICD-10-CM

## 2013-05-31 NOTE — Progress Notes (Signed)
HPI [name] is referred today by Dr. Teressa Lower for consideration for ICD implantation. The patient is a very pleasant 68 year old man with class II heart failure symptoms. He is a history of left ventricular dysfunction secondary to a nonischemic cardiomyopathy. A year ago, the patient's ejection fraction was 30%. Several weeks ago, he had a repeat echo demonstrating that his ejection fraction had dropped down to 20%. He has not had syncope. He is a history of difficulty with fluid overload but on his current medical regimen, has managed to stay fairly euvolemic. His heart failure is class II as previously noted and he denies noncompliance with salt and medication. He has minimal peripheral edema presently. He still smokes cigarettes infrequently. He is on maximal medical therapy. He has been intolerant to ACE inhibitors, as well as to ARB drugs secondary to hypotension. . No Known Allergies   Current Outpatient Prescriptions  Medication Sig Dispense Refill  . allopurinol (ZYLOPRIM) 100 MG tablet Take 3 tablets (300 mg total) by mouth daily. Take 3 tablets (300 mg) by mouth daily  90 tablet  6  . atorvastatin (LIPITOR) 20 MG tablet Take 20 mg by mouth every morning.       . cephALEXin (KEFLEX) 500 MG capsule Take 2 capsules (1,000 mg total) by mouth 2 (two) times daily.  28 capsule  0  . clobetasol cream (TEMOVATE) 0.05 % Apply 1 application topically 4 (four) times daily as needed (for rash.).  30 g  0  . digoxin (DIGOX) 0.125 MG tablet Take 1 tablet (0.125 mg total) by mouth daily.  30 tablet  6  . famotidine (PEPCID) 20 MG tablet Take 1 tablet (20 mg total) by mouth daily.  20 tablet  0  . glipiZIDE (GLUCOTROL XL) 5 MG 24 hr tablet Take 5 mg by mouth daily.       . hydrALAZINE (APRESOLINE) 10 MG tablet Take 10 mg by mouth daily.      . isosorbide mononitrate (IMDUR) 30 MG 24 hr tablet Take 30 mg by mouth daily.      . metolazone (ZAROXOLYN) 2.5 MG tablet Take 1 tablet (2.5 mg total)  by mouth as needed.  10 tablet  3  . metoprolol succinate (TOPROL-XL) 25 MG 24 hr tablet Take 12.5 mg by mouth daily at 10 pm.       . NAFTIN 2 % CREA Apply 1 application topically as needed.      . pantoprazole (PROTONIX) 40 MG tablet Take 1 tablet (40 mg total) by mouth daily.  30 tablet  4  . predniSONE (DELTASONE) 20 MG tablet Take 2 tablets (40 mg total) by mouth daily with breakfast.  6 tablet  0  . Tamsulosin HCl (FLOMAX) 0.4 MG CAPS Take 1 capsule (0.4 mg total) by mouth at bedtime.  90 capsule  1  . torsemide (DEMADEX) 20 MG tablet TAKE 1 TABLET BY MOUTH DAILY  30 tablet  6   No current facility-administered medications for this visit.     Past Medical History  Diagnosis Date  . Venous thromboembolism     DVTs in 2004, 2005, 2007, 8/10, 8/11. IVC filter 2005. Off coumadin 04/2011 secondary to rectal bleeding  . 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 Chronic venous insufficiency   . DM2 (diabetes mellitus, type 2)   . GERD (gastroesophageal reflux disease)   . BPH (benign prostatic hypertrophy)   . Low back pain   .  HTN (hypertension)   . HLD (hyperlipidemia)   . NICM (nonischemic cardiomyopathy) 1. 9/11  2. 2/12    1. Echo septal and apical akinesis, dilated LV, EF 45%, RV normal size and systolic function, EF 43% on Myoview  2. Admitted with decompensated CHF, echo EF 30-35% with diffuse hypokinesis but akinesis of mid to apical anteroseptal wall and apex, moderate LVH, mild MR, grade I diastolic dysfunction, SPEP/UP negative and HIV negative. Most recent EF 10-15% 04/2011  . Smoking   . CAD (coronary artery disease) 1. 2007  2. 10/11    1. Left heart cath with 40-50% stenosis in small RCA EF 50%  2. Lexiscan myoview EF 43%, global hypokinesis, possible small area of apical ischemia; LHC no angiographic CAD, no LV-gram done due to CKD   2. No angiographic CAD by cath 02/2010  . CKD (chronic kidney disease) stage 2, GFR 60-89 ml/min as of 02/2011  .  Headache(784.0)   . Diabetes mellitus   . Blood transfusion   . Anemia   . Syncope     1. in setting of presumed orthostatic Hypotn 09/2011  . Right fibular fracture     1. 09/2011 - resulting from fall/syncope  . Closed right ankle fracture 10/10/2011    ROS:   All systems reviewed and negative except as noted in the HPI.   Past Surgical History  Procedure Laterality Date  . Cardiac catheterization    . Appendectomy    . Cholecystectomy    . Colonoscopy  05/01/2011    Procedure: COLONOSCOPY;  Surgeon: Louis Meckelobert D Kaplan, MD;  [facility];  Service: Endoscopy;  Laterality: N/A;  . Orif ankle fracture  10/10/2011    Procedure: OPEN REDUCTION INTERNAL FIXATION (ORIF) ANKLE FRACTURE;  Surgeon: Eulas PostJoshua P Landau, MD;  Location: [facility] OR;  Service: Orthopedics;  Laterality: Right;  Right  Ankle Fracture ORIF     Family History  Problem Relation Age of Onset  . Coronary artery disease Neg Hx     Premature     History   Social History  . Marital Status: Divorced    Spouse Name: N/A    Number of Children: 1  . Years of Education: N/A   Occupational History  . security guard     Part time   Social History Main Topics  . Smoking status: Current Some Day Smoker -- 1.00 packs/day for 40 years    Types: Cigarettes    Last Attempt to Quit: 06/30/2011  . Smokeless tobacco: Never Used  . Alcohol Use: No  . Drug Use: No  . Sexual Activity: Not on file   Other Topics Concern  . Not on file   Social History Narrative   Has 1 son.     Ht 5\' 10"  (1.778 m)  Wt 168 lb (76.204 kg)  BMI 24.11 kg/m2  Physical Exam:  Well appearing 68 year old man,NAD HEENT: Unremarkable Neck:  No JVD, no thyromegally Back:  No CVA tenderness Lungs:  Clear with no wheezes, rales, or rhonchi. Breath sounds are slightly reduced HEART:  Regular rate rhythm, no murmurs, no rubs, no clicks Abd:  soft, positive bowel sounds, no organomegally, no rebound, no guarding Ext:  2 plus pulses, no  edema, no cyanosis, no clubbing Skin:  No rashes no nodules Neuro:  CN II through XII intact, motor grossly intact  EKG - normal sinus rhythm with left ventricular hypertrophy   Assess/Plan:

## 2013-05-31 NOTE — Assessment & Plan Note (Signed)
His current systolic heart failure is class II and well compensated. His ejection fraction is 20%, down from 30% one year ago. He is on maximal medical therapy though I note he developed hypotension with ACE inhibitors and ARB drugs in the past. I discussed the treatment options with the patient including the risk, goals, benefits, and expectations of ICD implantation and he would like to proceed with this treatment.

## 2013-05-31 NOTE — Assessment & Plan Note (Signed)
His blood pressure is currently well controlled. No change in medical therapy. 

## 2013-05-31 NOTE — Patient Instructions (Signed)

## 2013-06-01 LAB — BASIC METABOLIC PANEL
BUN: 14 mg/dL (ref 6–23)
CHLORIDE: 100 meq/L (ref 96–112)
CO2: 30 meq/L (ref 19–32)
Calcium: 9.7 mg/dL (ref 8.4–10.5)
Creatinine, Ser: 1.6 mg/dL — ABNORMAL HIGH (ref 0.4–1.5)
GFR: 57.68 mL/min — ABNORMAL LOW (ref >60.00)
Glucose, Bld: 325 mg/dL — ABNORMAL HIGH (ref 70–99)
POTASSIUM: 4 meq/L (ref 3.5–5.1)
Sodium: 136 mEq/L (ref 135–145)

## 2013-06-07 ENCOUNTER — Encounter (HOSPITAL_COMMUNITY): Payer: Self-pay | Admitting: Pharmacy Technician

## 2013-06-12 MED ORDER — SODIUM CHLORIDE 0.9 % IV SOLN
INTRAVENOUS | Status: DC
Start: 2013-06-12 — End: 2013-06-13
  Administered 2013-06-13: 08:00:00 via INTRAVENOUS

## 2013-06-12 MED ORDER — CHLORHEXIDINE GLUCONATE 4 % EX LIQD
60.0000 mL | Freq: Once | CUTANEOUS | Status: DC
  Filled 2013-06-12: qty 60

## 2013-06-12 MED ORDER — SODIUM CHLORIDE 0.9 % IR SOLN
80.0000 mg | Status: DC
  Filled 2013-06-12: qty 2

## 2013-06-12 MED ORDER — CEFAZOLIN SODIUM-DEXTROSE 2-3 GM-% IV SOLR
2.0000 g | INTRAVENOUS | Status: DC
  Filled 2013-06-12: qty 50

## 2013-06-13 ENCOUNTER — Ambulatory Visit (HOSPITAL_COMMUNITY)
Admission: RE | Admit: 2013-06-13 | Discharge: 2013-06-14 | Disposition: A | Discharge status: Home / Self Care | Payer: PRIVATE HEALTH INSURANCE | Source: Ambulatory Visit | Attending: Internal Medicine | Admitting: Internal Medicine

## 2013-06-13 ENCOUNTER — Encounter (HOSPITAL_COMMUNITY)
Admission: RE | Disposition: A | Discharge status: Home / Self Care | Payer: Self-pay | Source: Ambulatory Visit | Attending: Internal Medicine

## 2013-06-13 DIAGNOSIS — I2510 Atherosclerotic heart disease of native coronary artery without angina pectoris: Secondary | ICD-10-CM | POA: Insufficient documentation

## 2013-06-13 DIAGNOSIS — N400 Enlarged prostate without lower urinary tract symptoms: Secondary | ICD-10-CM | POA: Insufficient documentation

## 2013-06-13 DIAGNOSIS — I428 Other cardiomyopathies: Secondary | ICD-10-CM

## 2013-06-13 DIAGNOSIS — Z86718 Personal history of other venous thrombosis and embolism: Secondary | ICD-10-CM | POA: Insufficient documentation

## 2013-06-13 DIAGNOSIS — E785 Hyperlipidemia, unspecified: Secondary | ICD-10-CM | POA: Insufficient documentation

## 2013-06-13 DIAGNOSIS — I5022 Chronic systolic (congestive) heart failure: Secondary | ICD-10-CM | POA: Diagnosis present

## 2013-06-13 DIAGNOSIS — I509 Heart failure, unspecified: Secondary | ICD-10-CM | POA: Insufficient documentation

## 2013-06-13 DIAGNOSIS — I519 Heart disease, unspecified: Secondary | ICD-10-CM

## 2013-06-13 DIAGNOSIS — I517 Cardiomegaly: Secondary | ICD-10-CM | POA: Insufficient documentation

## 2013-06-13 DIAGNOSIS — I872 Venous insufficiency (chronic) (peripheral): Secondary | ICD-10-CM | POA: Insufficient documentation

## 2013-06-13 DIAGNOSIS — I129 Hypertensive chronic kidney disease with stage 1 through stage 4 chronic kidney disease, or unspecified chronic kidney disease: Secondary | ICD-10-CM | POA: Insufficient documentation

## 2013-06-13 DIAGNOSIS — E119 Type 2 diabetes mellitus without complications: Secondary | ICD-10-CM | POA: Insufficient documentation

## 2013-06-13 DIAGNOSIS — N182 Chronic kidney disease, stage 2 (mild): Secondary | ICD-10-CM | POA: Insufficient documentation

## 2013-06-13 DIAGNOSIS — F17200 Nicotine dependence, unspecified, uncomplicated: Secondary | ICD-10-CM | POA: Insufficient documentation

## 2013-06-13 DIAGNOSIS — K219 Gastro-esophageal reflux disease without esophagitis: Secondary | ICD-10-CM | POA: Insufficient documentation

## 2013-06-13 HISTORY — PX: IMPLANTABLE CARDIOVERTER DEFIBRILLATOR IMPLANT: SHX5473

## 2013-06-13 LAB — GLUCOSE, CAPILLARY
GLUCOSE-CAPILLARY: 395 mg/dL — AB (ref 70–99)
Glucose-Capillary: 397 mg/dL — ABNORMAL HIGH (ref 70–99)
Glucose-Capillary: 408 mg/dL — ABNORMAL HIGH (ref 70–99)

## 2013-06-13 LAB — CBC
HEMATOCRIT: 49.4 % (ref 39.0–52.0)
Hemoglobin: 17.3 g/dL — ABNORMAL HIGH (ref 13.0–17.0)
MCH: 29.8 pg (ref 26.0–34.0)
MCHC: 35 g/dL (ref 30.0–36.0)
MCV: 85 fL (ref 78.0–100.0)
Platelets: 154 10*3/uL (ref 150–400)
RBC: 5.81 MIL/uL (ref 4.22–5.81)
RDW: 14.5 % (ref 11.5–15.5)
WBC: 9 10*3/uL (ref 4.0–10.5)

## 2013-06-13 LAB — SURGICAL PCR SCREEN
MRSA, PCR: NEGATIVE
STAPHYLOCOCCUS AUREUS: NEGATIVE

## 2013-06-13 LAB — HEMOGLOBIN A1C
HEMOGLOBIN A1C: 11.7 % — AB (ref <5.7)
MEAN PLASMA GLUCOSE: 289 mg/dL — AB (ref <117)

## 2013-06-13 SURGERY — IMPLANTABLE CARDIOVERTER DEFIBRILLATOR IMPLANT
Anesthesia: LOCAL

## 2013-06-13 MED ORDER — OXYCODONE-ACETAMINOPHEN 5-325 MG PO TABS
1.0000 | ORAL_TABLET | ORAL | Status: DC | PRN
  Administered 2013-06-13: 1 via ORAL
  Filled 2013-06-13: qty 1

## 2013-06-13 MED ORDER — METOPROLOL SUCCINATE 12.5 MG HALF TABLET
12.5000 mg | ORAL_TABLET | Freq: Every day | ORAL | Status: DC
  Administered 2013-06-13 – 2013-06-14 (×2): 12.5 mg via ORAL
  Filled 2013-06-13 (×2): qty 1

## 2013-06-13 MED ORDER — INSULIN ASPART 100 UNIT/ML ~~LOC~~ SOLN
SUBCUTANEOUS | Status: AC
  Filled 2013-06-13: qty 1

## 2013-06-13 MED ORDER — MIDAZOLAM HCL 5 MG/5ML IJ SOLN
INTRAMUSCULAR | Status: AC
  Filled 2013-06-13: qty 5

## 2013-06-13 MED ORDER — DIGOXIN 125 MCG PO TABS
0.1250 mg | ORAL_TABLET | Freq: Every day | ORAL | Status: DC
Start: 2013-06-13 — End: 2013-06-14
  Administered 2013-06-13 – 2013-06-14 (×2): 0.125 mg via ORAL
  Filled 2013-06-13 (×3): qty 1

## 2013-06-13 MED ORDER — SODIUM CHLORIDE 0.9 % IR SOLN
Freq: Once | Status: DC
  Filled 2013-06-13: qty 2

## 2013-06-13 MED ORDER — GLIPIZIDE ER 5 MG PO TB24
5.0000 mg | ORAL_TABLET | Freq: Every day | ORAL | Status: DC
  Administered 2013-06-13 – 2013-06-14 (×2): 5 mg via ORAL
  Filled 2013-06-13 (×2): qty 1

## 2013-06-13 MED ORDER — MUPIROCIN 2 % EX OINT
TOPICAL_OINTMENT | CUTANEOUS | Status: AC
  Filled 2013-06-13: qty 22

## 2013-06-13 MED ORDER — HYDRALAZINE HCL 10 MG PO TABS
10.0000 mg | ORAL_TABLET | Freq: Every day | ORAL | Status: DC
  Administered 2013-06-13 – 2013-06-14 (×2): 10 mg via ORAL
  Filled 2013-06-13 (×2): qty 1

## 2013-06-13 MED ORDER — HEPARIN (PORCINE) IN NACL 2-0.9 UNIT/ML-% IJ SOLN
INTRAMUSCULAR | Status: AC
  Filled 2013-06-13: qty 500

## 2013-06-13 MED ORDER — ALLOPURINOL 300 MG PO TABS
300.0000 mg | ORAL_TABLET | Freq: Every day | ORAL | Status: DC
  Administered 2013-06-13 – 2013-06-14 (×2): 300 mg via ORAL
  Filled 2013-06-13 (×2): qty 1

## 2013-06-13 MED ORDER — ONDANSETRON HCL 4 MG/2ML IJ SOLN: 4.0000 mg | Freq: Four times a day (QID) | INTRAMUSCULAR | Status: DC | PRN

## 2013-06-13 MED ORDER — TAMSULOSIN HCL 0.4 MG PO CAPS
0.4000 mg | ORAL_CAPSULE | Freq: Every day | ORAL | Status: DC
Start: 2013-06-13 — End: 2013-06-14
  Administered 2013-06-13: 0.4 mg via ORAL
  Filled 2013-06-13 (×3): qty 1

## 2013-06-13 MED ORDER — ISOSORBIDE MONONITRATE ER 30 MG PO TB24
30.0000 mg | ORAL_TABLET | Freq: Every day | ORAL | Status: DC
  Administered 2013-06-14: 30 mg via ORAL
  Filled 2013-06-13: qty 1

## 2013-06-13 MED ORDER — FAMOTIDINE 20 MG PO TABS
20.0000 mg | ORAL_TABLET | Freq: Every day | ORAL | Status: DC
  Administered 2013-06-13 – 2013-06-14 (×2): 20 mg via ORAL
  Filled 2013-06-13 (×2): qty 1

## 2013-06-13 MED ORDER — CEFAZOLIN SODIUM-DEXTROSE 2-3 GM-% IV SOLR
2.0000 g | Freq: Four times a day (QID) | INTRAVENOUS | Status: AC
  Administered 2013-06-13 – 2013-06-14 (×3): 2 g via INTRAVENOUS
  Filled 2013-06-13 (×4): qty 50

## 2013-06-13 MED ORDER — PANTOPRAZOLE SODIUM 40 MG PO TBEC
40.0000 mg | DELAYED_RELEASE_TABLET | Freq: Every day | ORAL | Status: DC
  Administered 2013-06-14: 40 mg via ORAL
  Filled 2013-06-13: qty 1

## 2013-06-13 MED ORDER — METOLAZONE 2.5 MG PO TABS
2.5000 mg | ORAL_TABLET | Freq: Every day | ORAL | Status: DC | PRN
  Filled 2013-06-13: qty 1

## 2013-06-13 MED ORDER — INSULIN ASPART 100 UNIT/ML ~~LOC~~ SOLN
0.0000 [IU] | SUBCUTANEOUS | Status: DC
  Administered 2013-06-13 (×2): 15 [IU] via SUBCUTANEOUS
  Administered 2013-06-14: 2 [IU] via SUBCUTANEOUS
  Administered 2013-06-14: 3 [IU] via SUBCUTANEOUS
  Administered 2013-06-14: 5 [IU] via SUBCUTANEOUS

## 2013-06-13 MED ORDER — TORSEMIDE 20 MG PO TABS
20.0000 mg | ORAL_TABLET | Freq: Every evening | ORAL | Status: DC
  Administered 2013-06-13: 20 mg via ORAL
  Filled 2013-06-13 (×2): qty 1

## 2013-06-13 MED ORDER — LIDOCAINE HCL (PF) 1 % IJ SOLN
INTRAMUSCULAR | Status: AC
  Filled 2013-06-13: qty 60

## 2013-06-13 MED ORDER — FENTANYL CITRATE 0.05 MG/ML IJ SOLN
INTRAMUSCULAR | Status: AC
  Filled 2013-06-13: qty 2

## 2013-06-13 MED ORDER — ACETAMINOPHEN 325 MG PO TABS
325.0000 mg | ORAL_TABLET | ORAL | Status: DC | PRN
  Administered 2013-06-13: 650 mg via ORAL
  Filled 2013-06-13: qty 2

## 2013-06-13 MED ORDER — LIDOCAINE HCL (PF) 1 % IJ SOLN
INTRAMUSCULAR | Status: AC
  Filled 2013-06-13: qty 30

## 2013-06-13 MED ORDER — ATORVASTATIN CALCIUM 20 MG PO TABS
20.0000 mg | ORAL_TABLET | Freq: Every day | ORAL | Status: DC
  Administered 2013-06-14: 20 mg via ORAL
  Filled 2013-06-13: qty 1

## 2013-06-13 MED ORDER — MUPIROCIN 2 % EX OINT
TOPICAL_OINTMENT | Freq: Two times a day (BID) | CUTANEOUS | Status: DC
  Administered 2013-06-13: 08:00:00 via NASAL

## 2013-06-13 NOTE — Progress Notes (Signed)
Pt CBG 408, notified MD, ordered to give 10 U novolog.  Pt refusing to take insulin, says it makes his sugar worse.  MD notified.

## 2013-06-13 NOTE — H&P (Signed)
  ICD Criteria  Current LVEF (within 6 months):20%  NYHA Functional Classification: Class II  Heart Failure History:  Yes, Duration of heart failure since onset is > 9 months  Non-Ischemic Dilated Cardiomyopathy History:  No.  Atrial Fibrillation/Atrial Flutter:  No.  Ventricular Tachycardia History:  No.  Cardiac Arrest History:  No  History of Syndromes with Risk of Sudden Death:  No.  Previous ICD:  No.  Electrophysiology Study: No.  Prior MI: No.  PPM: No.  OSA:  No  Patient Life Expectancy of >=1 year: Yes.  Anticoagulation Therapy:  Patient is NOT on anticoagulation therapy.   Beta Blocker Therapy:  Yes.   Ace Inhibitor/ARB Therapy:  No, Reason not on Ace Inhibitor/ARB therapy:  Hypotension

## 2013-06-13 NOTE — CV Procedure (Signed)
Electrophysiology procedure note  Procedure: Insertion of a single chamber ICD  Indication: Long-standing nonischemic cardiomyopathy, chronic systolic heart failure, ejection fraction 20%, despite maximal medical therapy.  Findings: After informed consent was obtained, the patient was taken to the diagnostic electrophysiology laboratory in the fasting state. After the usual preparation and draping, intravenous fentanyl and Versed was given for sedation. 30 cc of lidocaine was infiltrated into the left infraclavicular region. A 7 cm incision was carried out over this region and electrocautery was utilized to dissect down to the fascial plane. Attempts to puncture the left subclavian vein were unsuccessful. 10 cc of contrast dye was infiltrated into the left upper extremity venous system, demonstrating that the vein had spasmed and was quite small. It was then successfully punctured and the [employer] Jude single coil active fixation defibrillation lead, [id number], was inserted under fluoroscopic guidance into the left subclavian vein and on into the right ventricle. Mapping was carried out which demonstrated R waves of 13. The pacing impedance was 700 ohms. The pacing threshold was less than 1 V at 0.5 ms. 10 V pacing did not stimulate the diaphragm. A satisfactory injury current was present. The lead was secured to the subpectoralis fascia with silk suture. The sewing sleeve was secured with silk suture. Antibiotic irrigation was utilized to irrigate the pocket. Electrocautery was utilized to assure hemostasis. Electrocautery was utilized to make a subcutaneous pocket. The [employer] Jude Ellipse VR single chamber ICD was connected to the ICD lead and placed into the subcutaneous pocket. The pocket was irrigated with additional antibiotic irrigation. The incision was closed with 2 layers of Vicryl suture. Benzoin and Steri-Strips were pain on the scan. At this point I scrubbed out of the case to supervise defibrillation  threshold testing.  After the patient was more deeply sedated under my direct supervision with intravenous fentanyl and Versed, ventricular fibrillation was induced with a T-wave shock. A 20 J shock was then delivered, restoring sinus rhythm. Appropriate sensing was present.  Complications: There were no immediate pedal complications  Conclusion: This demonstrate successful insertion of a single chamber ICD in a patient with long-standing nonischemic cardiomyopathy, chronic class II systolic heart failure, ejection fraction 20%, despite maximal medical therapy.  Lewayne Bunting, M.D.

## 2013-06-13 NOTE — Discharge Summary (Signed)
ELECTROPHYSIOLOGY PROCEDURE DISCHARGE SUMMARY    Patient ID: [name],  MRN: [id number], DOB/AGE: [birth year] 68 y.o.  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  Prior venous thromboembolism, now off anticoagulation due to rectal bleeding 2.  Chronic venous insufficiency 3.  Type II diabetes 4.  GERD 5.  BPH 6.  Hypertension 7.  Hyperlipidemia 8.  Chronic kidney disease  No Known Allergies   Procedures This Admission:  1.  Implantation of a STJ single chamber ICD on 06/14/2013 by Dr Ladona Ridgelaylor.  The patient received a St Jude model number Ellipse VR ICD with model number , [id number] right ventricular lead.  DFT's were successful at 20 J.  There were no immediate post procedure complications. 2.  CXR on 06/14/13 demonstrated no PTX.   Brief HPI: Timothy BisMelvin D Norden is a 68 y.o. male was referred to electrophysiology in the outpatient setting for consideration of ICD implantation.  Past medical history includes non ischemic cardiomyopathy, chronic systolic heart failure (EF20%), hypertension, and diabetes.  The patient has persistent LV dysfunction despite guideline directed therapy.  Risks, benefits, and alternatives to ICD implantation were reviewed with the patient who wished to proceed.   Hospital Course:  The patient was admitted and underwent implantation of a STJ single chamber ICD with details as outlined above.   He was monitored on telemetry overnight which demonstrated sinus rhythm.  Left chest was without hematoma or ecchymosis.  The device was interrogated and found to be functioning normally.  CXR was obtained and demonstrated no pneumothorax status post device implantation.  Wound care, arm mobility, and  restrictions were reviewed with the patient.  Dr Ladona Ridgelaylor examined the patient and considered them stable for discharge to home. The patient has been advised to establish with a PCP for diabetes management.   The patient's discharge medications include a beta blocker (Metoprolol). The patient has been intolerant to ACE/ARB due to hypotension.   Discharge Vitals: Blood pressure 114/77, pulse 89, temperature 97.8 F (36.6 C), temperature source Oral, resp. rate 20, height 6' (1.829 m), weight 175 lb 11.3 oz (79.7 kg), SpO2 96.00%.   Labs:   Lab Results  Component Value Date   WBC 9.0 06/13/2013   HGB 17.3* 06/13/2013   HCT 49.4 06/13/2013   MCV 85.0 06/13/2013   PLT 154 06/13/2013   No results found for this basename: NA, K, CL, CO2, BUN, CREATININE, CALCIUM, LABALBU, PROT, BILITOT, ALKPHOS, ALT, AST, GLUCOSE,  in the last 168 hours   Discharge Medications:    Medication List    ASK your doctor about these medications       allopurinol 100 MG tablet  Commonly known as:  ZYLOPRIM  Take 3 tablets (300 mg total) by mouth daily. Take 3 tablets (300 mg) by mouth daily     atorvastatin 20 MG tablet  Commonly known as:  LIPITOR  Take 20 mg by mouth every morning.     clobetasol cream 0.05 %  Commonly known as:  TEMOVATE  Apply 1 application topically 4 (four) times daily as needed (for rash.).     digoxin 0.125 MG tablet  Commonly known as:  DIGOX  Take 1 tablet (0.125 mg total) by mouth daily.     famotidine 20 MG tablet  Commonly known  as:  PEPCID  Take 1 tablet (20 mg total) by mouth daily.     glipiZIDE 5 MG 24 hr tablet  Commonly known as:  GLUCOTROL XL  Take 5 mg by mouth daily.     hydrALAZINE 10 MG tablet  Commonly known as:  APRESOLINE  Take 10 mg by mouth daily.     isosorbide mononitrate 30 MG 24 hr tablet  Commonly known as:  IMDUR  Take 30 mg by mouth daily.     metolazone 2.5 MG tablet  Commonly known as:  ZAROXOLYN  Take 2.5 mg by mouth daily as needed (for  increased fluid.).     metoprolol succinate 25 MG 24 hr tablet  Commonly known as:  TOPROL-XL  Take 12.5 mg by mouth daily.     NAFTIN 2 % Crea  Generic drug:  Naftifine HCl  Apply 1 application topically daily as needed (apply to foot fungus).     pantoprazole 40 MG tablet  Commonly known as:  PROTONIX  Take 1 tablet (40 mg total) by mouth daily.     tamsulosin 0.4 MG Caps capsule  Commonly known as:  FLOMAX  Take 1 capsule (0.4 mg total) by mouth at bedtime.     torsemide 20 MG tablet  Commonly known as:  DEMADEX  Take 20 mg by mouth every evening.        Disposition:   Future Appointments Provider Department Dept Phone   06/23/2013 3:00 PM [facility] [place] Office [phone]       Duration of Discharge Encounter: less than 30 minutes including physician time.  Signed, Leonia Reeves.D.

## 2013-06-13 NOTE — Interval H&P Note (Signed)
History and Physical Interval Note:  06/13/2013 10:40 AM  [name]  has presented today for surgery, with the diagnosis of CHF, LV DISFUNCTION  The various methods of treatment have been discussed with the patient and family. After consideration of risks, benefits and other options for treatment, the patient has consented to  Procedure(s): IMPLANTABLE CARDIOVERTER DEFIBRILLATOR IMPLANT (N/A) as a surgical intervention .  The patient's history has been reviewed, patient examined, no change in status, stable for surgery.  I have reviewed the patient's chart and labs.  Questions were answered to the patient's satisfaction.     Leonia Reeves.D.

## 2013-06-13 NOTE — H&P (View-Only) (Signed)
HPI [name] is referred today by Dr. Teressa Lower for consideration for ICD implantation. The patient is a very pleasant 68 year old man with class II heart failure symptoms. He is a history of left ventricular dysfunction secondary to a nonischemic cardiomyopathy. A year ago, the patient's ejection fraction was 30%. Several weeks ago, he had a repeat echo demonstrating that his ejection fraction had dropped down to 20%. He has not had syncope. He is a history of difficulty with fluid overload but on his current medical regimen, has managed to stay fairly euvolemic. His heart failure is class II as previously noted and he denies noncompliance with salt and medication. He has minimal peripheral edema presently. He still smokes cigarettes infrequently. He is on maximal medical therapy. He has been intolerant to ACE inhibitors, as well as to ARB drugs secondary to hypotension. . No Known Allergies   Current Outpatient Prescriptions  Medication Sig Dispense Refill  . allopurinol (ZYLOPRIM) 100 MG tablet Take 3 tablets (300 mg total) by mouth daily. Take 3 tablets (300 mg) by mouth daily  90 tablet  6  . atorvastatin (LIPITOR) 20 MG tablet Take 20 mg by mouth every morning.       . cephALEXin (KEFLEX) 500 MG capsule Take 2 capsules (1,000 mg total) by mouth 2 (two) times daily.  28 capsule  0  . clobetasol cream (TEMOVATE) 0.05 % Apply 1 application topically 4 (four) times daily as needed (for rash.).  30 g  0  . digoxin (DIGOX) 0.125 MG tablet Take 1 tablet (0.125 mg total) by mouth daily.  30 tablet  6  . famotidine (PEPCID) 20 MG tablet Take 1 tablet (20 mg total) by mouth daily.  20 tablet  0  . glipiZIDE (GLUCOTROL XL) 5 MG 24 hr tablet Take 5 mg by mouth daily.       . hydrALAZINE (APRESOLINE) 10 MG tablet Take 10 mg by mouth daily.      . isosorbide mononitrate (IMDUR) 30 MG 24 hr tablet Take 30 mg by mouth daily.      . metolazone (ZAROXOLYN) 2.5 MG tablet Take 1 tablet (2.5 mg total)  by mouth as needed.  10 tablet  3  . metoprolol succinate (TOPROL-XL) 25 MG 24 hr tablet Take 12.5 mg by mouth daily at 10 pm.       . NAFTIN 2 % CREA Apply 1 application topically as needed.      . pantoprazole (PROTONIX) 40 MG tablet Take 1 tablet (40 mg total) by mouth daily.  30 tablet  4  . predniSONE (DELTASONE) 20 MG tablet Take 2 tablets (40 mg total) by mouth daily with breakfast.  6 tablet  0  . Tamsulosin HCl (FLOMAX) 0.4 MG CAPS Take 1 capsule (0.4 mg total) by mouth at bedtime.  90 capsule  1  . torsemide (DEMADEX) 20 MG tablet TAKE 1 TABLET BY MOUTH DAILY  30 tablet  6   No current facility-administered medications for this visit.     Past Medical History  Diagnosis Date  . Venous thromboembolism     DVTs in 2004, 2005, 2007, 8/10, 8/11. IVC filter 2005. Off coumadin 04/2011 secondary to rectal bleeding  . 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 Chronic venous insufficiency   . DM2 (diabetes mellitus, type 2)   . GERD (gastroesophageal reflux disease)   . BPH (benign prostatic hypertrophy)   . Low back pain   .  HTN (hypertension)   . HLD (hyperlipidemia)   . NICM (nonischemic cardiomyopathy) 1. 9/11  2. 2/12    1. Echo septal and apical akinesis, dilated LV, EF 45%, RV normal size and systolic function, EF 43% on Myoview  2. Admitted with decompensated CHF, echo EF 30-35% with diffuse hypokinesis but akinesis of mid to apical anteroseptal wall and apex, moderate LVH, mild MR, grade I diastolic dysfunction, SPEP/UP negative and HIV negative. Most recent EF 10-15% 04/2011  . Smoking   . CAD (coronary artery disease) 1. 2007  2. 10/11    1. Left heart cath with 40-50% stenosis in small RCA EF 50%  2. Lexiscan myoview EF 43%, global hypokinesis, possible small area of apical ischemia; LHC no angiographic CAD, no LV-gram done due to CKD   2. No angiographic CAD by cath 02/2010  . CKD (chronic kidney disease) stage 2, GFR 60-89 ml/min as of 02/2011  .  Headache(784.0)   . Diabetes mellitus   . Blood transfusion   . Anemia   . Syncope     1. in setting of presumed orthostatic Hypotn 09/2011  . Right fibular fracture     1. 09/2011 - resulting from fall/syncope  . Closed right ankle fracture 10/10/2011    ROS:   All systems reviewed and negative except as noted in the HPI.   Past Surgical History  Procedure Laterality Date  . Cardiac catheterization    . Appendectomy    . Cholecystectomy    . Colonoscopy  05/01/2011    Procedure: COLONOSCOPY;  Surgeon: Robert D Kaplan, MD;  Location: [facility] ENDOSCOPY;  Service: Endoscopy;  Laterality: N/A;  . Orif ankle fracture  10/10/2011    Procedure: OPEN REDUCTION INTERNAL FIXATION (ORIF) ANKLE FRACTURE;  Surgeon: Joshua P Landau, MD;  Location: [facility] OR;  Service: Orthopedics;  Laterality: Right;  Right  Ankle Fracture ORIF     Family History  Problem Relation Age of Onset  . Coronary artery disease Neg Hx     Premature     History   Social History  . Marital Status: Divorced    Spouse Name: N/A    Number of Children: 1  . Years of Education: N/A   Occupational History  . security guard     Part time   Social History Main Topics  . Smoking status: Current Some Day Smoker -- 1.00 packs/day for 40 years    Types: Cigarettes    Last Attempt to Quit: 06/30/2011  . Smokeless tobacco: Never Used  . Alcohol Use: No  . Drug Use: No  . Sexual Activity: Not on file   Other Topics Concern  . Not on file   Social History Narrative   Has 1 son.     Ht 5' 10" (1.778 m)  Wt 168 lb (76.204 kg)  BMI 24.11 kg/m2  Physical Exam:  Well appearing 67-year-old man,NAD HEENT: Unremarkable Neck:  No JVD, no thyromegally Back:  No CVA tenderness Lungs:  Clear with no wheezes, rales, or rhonchi. Breath sounds are slightly reduced HEART:  Regular rate rhythm, no murmurs, no rubs, no clicks Abd:  soft, positive bowel sounds, no organomegally, no rebound, no guarding Ext:  2 plus pulses, no  edema, no cyanosis, no clubbing Skin:  No rashes no nodules Neuro:  CN II through XII intact, motor grossly intact  EKG - normal sinus rhythm with left ventricular hypertrophy   Assess/Plan: 

## 2013-06-14 ENCOUNTER — Ambulatory Visit (HOSPITAL_COMMUNITY): Payer: PRIVATE HEALTH INSURANCE

## 2013-06-14 LAB — GLUCOSE, CAPILLARY
Glucose-Capillary: 359 mg/dL — ABNORMAL HIGH (ref 70–99)
Glucose-Capillary: 138 mg/dL — ABNORMAL HIGH (ref 70–99)
Glucose-Capillary: 168 mg/dL — ABNORMAL HIGH (ref 70–99)
Glucose-Capillary: 201 mg/dL — ABNORMAL HIGH (ref 70–99)

## 2013-06-14 NOTE — Progress Notes (Signed)
Laurey Morale, CN attempted to review discharge instructions and pt refused.  Signed forms and dated and took forms.  Written on form are d/c instructions with no new meds so no prescriptions, f/u appt scheduled with Dr Julio Sicks for appt Thursday, 06/16/13, and appt for wound check 06/23/13 @ 3pm.  Diet listed on sheet and pt is followed at HF clinic and [place], HF clinic reviewed HF discharge instructions.  Pt alert and oriented.

## 2013-06-14 NOTE — Progress Notes (Signed)
Pt viewed ICD video.  No questions.  Stated understanding

## 2013-06-14 NOTE — Progress Notes (Addendum)
Physician on call for Dr.Taylor, Dr.Nahsar was called and notified this am about patient's EKG results. Pt.is A/Ox4 and has no c/o pain and no signs of distress. He remains ambulatory with 1 person assist. Pt.had recent ICD placement to left upper chest. Dressing in clean, dry, and intact.

## 2013-06-14 NOTE — Progress Notes (Signed)
I talked briefly with [name] who is an established patient with the [facility] Failure clinic.  He is admitted for ICD Implantation.  We reviewed heart failure education and he acknowledges that he is aware of the importance of daily weights, signs and symptoms of heart failure, when to call the physician, low sodium diet and fluid restriction. Driscilla Moats RN, BSN, PCCN--Heart Failure Clinic

## 2013-06-14 NOTE — Discharge Instructions (Signed)
° ° °  Supplemental Discharge Instructions for  °Pacemaker/Defibrillator Patients ° °Activity °No heavy lifting or vigorous activity with your left/right arm for 6 to 8 weeks.  Do not raise your left/right arm above your head for one week.  Gradually raise your affected arm as drawn below. ° °        ° 06/16/13                            06/17/13                    06/18/13                  06/19/13 ° °NO DRIVING for 1 week    ° °WOUND CARE °  Keep the wound area clean and dry.  Do not get this area wet for one week. No showers for one week; you may shower on 06/21/13   . °  The tape/steri-strips on your wound will fall off; do not pull them off.  No bandage is needed on the site.  DO  NOT apply any creams, oils, or ointments to the wound area. °  If you notice any drainage or discharge from the wound, any swelling or bruising at the site, or you develop a fever > 101? F after you are discharged home, call the office at once. ° °Special Instructions °  You are still able to use cellular telephones; use the ear opposite the side where you have your pacemaker/defibrillator.  Avoid carrying your cellular phone near your device. °  When traveling through airports, show security personnel your identification card to avoid being screened in the metal detectors.  Ask the security personnel to use the hand wand. °  Avoid arc welding equipment, MRI testing (magnetic resonance imaging), TENS units (transcutaneous nerve stimulators).  Call the office for questions about other devices. °  Avoid electrical appliances that are in poor condition or are not properly grounded. °  Microwave ovens are safe to be near or to operate. ° °Additional information for defibrillator patients should your device go off: °  If your device goes off ONCE and you feel fine afterward, notify the device clinic nurses. °  If your device goes off ONCE and you do not feel well afterward, call 911. °  If your device goes off TWICE, call 911. °  If your device  goes off THREE times in one day, call 911. ° °DO NOT DRIVE YOURSELF OR A FAMILY MEMBER °WITH A DEFIBRILLATOR TO THE HOSPITAL--CALL 911. ° °

## 2013-06-23 ENCOUNTER — Ambulatory Visit: Payer: PRIVATE HEALTH INSURANCE

## 2013-06-24 ENCOUNTER — Ambulatory Visit (INDEPENDENT_AMBULATORY_CARE_PROVIDER_SITE_OTHER): Payer: PRIVATE HEALTH INSURANCE | Admitting: *Deleted

## 2013-06-24 ENCOUNTER — Encounter: Payer: Self-pay | Admitting: *Deleted

## 2013-06-24 DIAGNOSIS — I428 Other cardiomyopathies: Secondary | ICD-10-CM

## 2013-06-24 DIAGNOSIS — R000 Tachycardia, unspecified: Secondary | ICD-10-CM

## 2013-06-24 DIAGNOSIS — I5022 Chronic systolic (congestive) heart failure: Secondary | ICD-10-CM

## 2013-06-24 LAB — MDC_IDC_ENUM_SESS_TYPE_INCLINIC
Brady Statistic RV Percent Paced: 0 %
Date Time Interrogation Session: 20150206152703
HighPow Impedance: 66.375
Implantable Pulse Generator Serial Number: 7155321
Lead Channel Impedance Value: 525 Ohm
Lead Channel Pacing Threshold Pulse Width: 0.5 ms
Lead Channel Sensing Intrinsic Amplitude: 12 mV
MDC IDC MSMT BATTERY REMAINING LONGEVITY: 97.2 mo
MDC IDC MSMT LEADCHNL RV PACING THRESHOLD AMPLITUDE: 0.75 V
MDC IDC MSMT LEADCHNL RV PACING THRESHOLD AMPLITUDE: 0.75 V
MDC IDC MSMT LEADCHNL RV PACING THRESHOLD PULSEWIDTH: 0.5 ms
MDC IDC SET LEADCHNL RV PACING AMPLITUDE: 3.5 V
MDC IDC SET LEADCHNL RV PACING PULSEWIDTH: 0.5 ms
MDC IDC SET LEADCHNL RV SENSING SENSITIVITY: 0.5 mV
Zone Setting Detection Interval: 250 ms
Zone Setting Detection Interval: 280 ms
Zone Setting Detection Interval: 335 ms

## 2013-06-24 NOTE — Progress Notes (Signed)
Wound check appointment. Steri-strips removed. Wound without redness or edema. Incision edges approximated, wound well healed. Normal device function. Threshold, sensing, and impedance consistent with implant measurements. Device programmed at 3.5V for extra safety margin until 3 month visit. Histogram distribution appropriate for patient and level of activity. No ventricular arrhythmias noted. Patient educated about wound care, arm mobility, lifting restrictions, shock plan. ROV in 3 months with GT.

## 2013-07-05 NOTE — H&P (Signed)
  ICD Criteria  Current LVEF 20% ;Obtained < 1 month ago.  NYHA Functional Classification: Class II  Heart Failure History:  Yes, Duration of heart failure since onset is > 9 months  Non-Ischemic Dilated Cardiomyopathy History:  Yes, timeframe is > 9 months  Atrial Fibrillation/Atrial Flutter:  No.  Ventricular Tachycardia History:  No.  Cardiac Arrest History:  No  History of Syndromes with Risk of Sudden Death:  No.  Previous ICD:  No.  Electrophysiology Study: No.  Prior MI: No.  PPM: No.  OSA:  No  Patient Life Expectancy of >=1 year: Yes.  Anticoagulation Therapy:  Patient is NOT on anticoagulation therapy.   Beta Blocker Therapy:  Yes.   Ace Inhibitor/ARB Therapy:  No, Reason not on Ace Inhibitor/ARB therapy:  hypotension

## 2013-07-06 ENCOUNTER — Encounter (HOSPITAL_COMMUNITY): Payer: PRIVATE HEALTH INSURANCE

## 2013-07-07 ENCOUNTER — Encounter (HOSPITAL_COMMUNITY): Payer: Self-pay

## 2013-07-07 ENCOUNTER — Inpatient Hospital Stay (HOSPITAL_COMMUNITY): Admission: RE | Admit: 2013-07-07 | Payer: PRIVATE HEALTH INSURANCE | Source: Ambulatory Visit

## 2013-07-07 ENCOUNTER — Telehealth (HOSPITAL_COMMUNITY): Payer: Self-pay

## 2013-07-07 NOTE — Telephone Encounter (Signed)
Returned missed call to patient to reschedule missed appointment from today.  Will send reminder letter in mail with necessary appointment information for patient. [facility]

## 2013-07-18 ENCOUNTER — Encounter (HOSPITAL_COMMUNITY): Payer: Self-pay

## 2013-07-18 ENCOUNTER — Ambulatory Visit (HOSPITAL_COMMUNITY)
Admission: RE | Admit: 2013-07-18 | Discharge: 2013-07-18 | Disposition: A | Discharge status: Home / Self Care | Payer: PRIVATE HEALTH INSURANCE | Source: Ambulatory Visit | Attending: Internal Medicine | Admitting: Internal Medicine

## 2013-07-18 VITALS — BP 128/64 | HR 74 | Resp 18 | Wt 164.2 lb

## 2013-07-18 DIAGNOSIS — N189 Chronic kidney disease, unspecified: Secondary | ICD-10-CM

## 2013-07-18 DIAGNOSIS — I82409 Acute embolism and thrombosis of unspecified deep veins of unspecified lower extremity: Secondary | ICD-10-CM

## 2013-07-18 DIAGNOSIS — I5022 Chronic systolic (congestive) heart failure: Secondary | ICD-10-CM

## 2013-07-18 NOTE — Progress Notes (Signed)
Patient ID: [name], male   DOB: 04/24/1946, 68 y.o.   MRN: [id number]  PCP: Dr. Jeanelle Malling Osei-Bonsu Urologist: Dr Retta Diones  HPI:  [name] is a 68 year old male with chronic systolic CHF secondary to NICM echo EF 30-35%, HTN, anemia, chronic renal insufficiency, UGI bleed, duodenal AVMs,  rectal bleeding 04/2011,  DVTs (2004, 2005, 2007, 2010, 2011) however coumadin on hold due to GI bleed, IVC filter 2005, GERD, BPD, Urinary Retention, and chronic low back.  09/2011 S/P ORIF L ankle. S/P ICD January 2015.  Spironolactone switched to eplerenone due to gynecomastia.  Admitted from HF clinic 05/30/2012 with volume overload. Diuresed with Milrinone and Lasix. Diuresed 41 pounds. Milrinone and lasix gtt weaned off. CO-OX dropped 43. Beta blocker was stopped due to low output. He was offered home Milrinone however he declined.  Discharge weight 175 pounds.    He returns for follow up. Since the last visit he had ST Jude ICD placed the end of January. No shocks. Overall he is feeling ok. Denies SOB/PND/Orthopnea. Weight at home 160-170 pounds. He has not required additional diuretics. Following low salt diet and limiting fluid intake to < 2 liters per day. Compliant with medications.    RHC 2/13 RA = 4  RV = 32/6 (7)  PA = 45/15 (24)  PCW = 7  Fick cardiac output/index = 5.2/2.7  Thermo = 4.0/2.1  PVR = 3.4 Woods  FA sat = 94%  PA sat = 56%, 57%  Noninvasive BP : 125/92 (72)  Calculated SVR = 1040  08/13/11 CPX  RER 1.16 Peak VO2 18.6 ml/kg/min Predicted 61.4% VE/VCO2 slope 42.8 08/23/2011 ECHO EF 30-35% 05/09/13 ECHO EF 20-25% RV mod-severely hypokinetic  Labs 02/23/13 K 3.9 Creatinine 1.58 Dig level 0.9  Labs 05/31/13 K 4.0 Creatinine  1.6     ROS: All systems negative except as listed in HPI, PMH and Problem List.  Past Medical History  Diagnosis Date  . Venous thromboembolism     DVTs in 2004, 2005, 2007, 8/10, 8/11. IVC filter 2005. Off coumadin 04/2011 secondary to rectal  bleeding  . 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 Chronic venous insufficiency   . DM2 (diabetes mellitus, type 2)   . GERD (gastroesophageal reflux disease)   . BPH (benign prostatic hypertrophy)   . Low back pain   . HTN (hypertension)   . HLD (hyperlipidemia)   . NICM (nonischemic cardiomyopathy) 1. 9/11  2. 2/12    1. Echo septal and apical akinesis, dilated LV, EF 45%, RV normal size and systolic function, EF 43% on Myoview  2. Admitted with decompensated CHF, echo EF 30-35% with diffuse hypokinesis but akinesis of mid to apical anteroseptal wall and apex, moderate LVH, mild MR, grade I diastolic dysfunction, SPEP/UP negative and HIV negative. Most recent EF 10-15% 04/2011  . Smoking   . CAD (coronary artery disease) 1. 2007  2. 10/11    1. Left heart cath with 40-50% stenosis in small RCA EF 50%  2. Lexiscan myoview EF 43%, global hypokinesis, possible small area of apical ischemia; LHC no angiographic CAD, no LV-gram done due to CKD   2. No angiographic CAD by cath 02/2010  . CKD (chronic kidney disease) stage 2, GFR 60-89 ml/min as of 02/2011  . Headache(784.0)   . Diabetes mellitus   . Blood transfusion   . Anemia   . Syncope     1. in setting of presumed orthostatic  Hypotn 09/2011  . Right fibular fracture     1. 09/2011 - resulting from fall/syncope  . Closed right ankle fracture 10/10/2011    Current Outpatient Prescriptions  Medication Sig Dispense Refill  . allopurinol (ZYLOPRIM) 100 MG tablet Take 3 tablets (300 mg total) by mouth daily. Take 3 tablets (300 mg) by mouth daily  90 tablet  6  . atorvastatin (LIPITOR) 20 MG tablet Take 20 mg by mouth every morning.       . clobetasol cream (TEMOVATE) 0.05 % Apply 1 application topically 4 (four) times daily as needed (for rash.).  30 g  0  . digoxin (DIGOX) 0.125 MG tablet Take 1 tablet (0.125 mg total) by mouth daily.  30 tablet  6  . famotidine (PEPCID) 20 MG tablet Take 1 tablet (20 mg total) by  mouth daily.  20 tablet  0  . hydrALAZINE (APRESOLINE) 10 MG tablet Take 10 mg by mouth daily.      . isosorbide mononitrate (IMDUR) 30 MG 24 hr tablet Take 30 mg by mouth daily.      . metolazone (ZAROXOLYN) 2.5 MG tablet Take 2.5 mg by mouth daily as needed (for increased fluid.).      Marland Kitchen. metoprolol succinate (TOPROL-XL) 25 MG 24 hr tablet Take 12.5 mg by mouth daily.       Marland Kitchen. NAFTIN 2 % CREA Apply 1 application topically daily as needed (apply to foot fungus).       . pantoprazole (PROTONIX) 40 MG tablet Take 1 tablet (40 mg total) by mouth daily.  30 tablet  4  . Tamsulosin HCl (FLOMAX) 0.4 MG CAPS Take 1 capsule (0.4 mg total) by mouth at bedtime.  90 capsule  1  . torsemide (DEMADEX) 20 MG tablet Take 20 mg by mouth every evening.      Marland Kitchen. glipiZIDE (GLUCOTROL XL) 5 MG 24 hr tablet Take 5 mg by mouth daily.        No current facility-administered medications for this encounter.    Filed Vitals:   07/18/13 1435  BP: 128/64  Pulse: 74  Resp: 18  Weight: 164 lb 4 oz (74.503 kg)  SpO2: 98%   PHYSICAL EXAM: General: Elderly.  No acute distress.  HEENT: normal except for poor dentition Neck: supple. JVP 5-6 prominent V waves Carotids 2+ bilaterally; no bruits. No lymphadenopathy or thryomegaly appreciated. Cor: PMI displaced laterally. Regular rate and rhythm. +S3 L upper chest scar from ICD.  Lungs: clear Abdomen: Non tender. Good bowel sounds. Non distended Extremities: no cyanosis, clubbing, rash,edema Neuro: alert & orientedx3, cranial nerves grossly intact. Moves all 4 extremities w/o difficulty. Affect pleasant.    ASSESSMENT & PLAN:  1) Chronic systolic HF, NICM, EF 20-25% 04/2013  -S/P St Jude ICD  NYHA II-III  Functionally he continues to do well. Volume status stable. Continue 20 mg of torsemide and  eplerenone 25 mg daily. (no on spiro due to gynecomastia) - Continue Torpol 50 mg in the am and 12.5 mg in pm.and digoxin 0.125 mg  -Continue  hydralazine 10 mg TID and IMDUR  15 mg. -He is not on ACE-I due to hypotension/CKD. - Reinforced the need and importance of daily weights, a low sodium diet, and fluid restriction (less than 2 L a day). Instructed to call the HF clinic if weight increases more than 3 lbs overnight or 5 lbs in a week.   2) CRI stage 3 (baseline Cr 1.7-2.0) Reviewed BMET from January 2015. Stable  3) Recurrent  DVTs - No signs or symptoms of DVT - Not on Coumadin d/t hx GIB and he refuses all antocoagulants  4) Gout- stable. Continue allopurinol daily.  Follow up in 2 months    Timothy Schubert NP-C 2:50 PM

## 2013-07-18 NOTE — Patient Instructions (Signed)
Follow up in 2 months with Dr Bensimhon  Do the following things EVERYDAY: 1) Weigh yourself in the morning before breakfast. Write it down and keep it in a log. 2) Take your medicines as prescribed 3) Eat low salt foods-Limit salt (sodium) to 2000 mg per day.  4) Stay as active as you can everyday 5) Limit all fluids for the day to less than 2 liters 

## 2013-07-19 ENCOUNTER — Encounter: Payer: Self-pay | Admitting: Internal Medicine

## 2013-07-20 ENCOUNTER — Telehealth: Payer: Self-pay | Admitting: Internal Medicine

## 2013-07-20 NOTE — Telephone Encounter (Signed)
Unable to connect phone call and wrong number for cell/kwm

## 2013-07-20 NOTE — Telephone Encounter (Signed)
New message     Defibulator box is not hooked up correct.  Pt cannot get outside calls on his house phone.  The phone company said it is not them it is the box.  Pt is not happy.  Please call him soon on his cell phone

## 2013-08-04 ENCOUNTER — Other Ambulatory Visit (HOSPITAL_COMMUNITY): Payer: Self-pay

## 2013-08-04 MED ORDER — TORSEMIDE 20 MG PO TABS: 20.0000 mg | ORAL_TABLET | Freq: Every evening | ORAL | Status: DC

## 2013-08-10 ENCOUNTER — Emergency Department (HOSPITAL_COMMUNITY)
Admission: EM | Admit: 2013-08-10 | Discharge: 2013-08-10 | Disposition: A | Discharge status: Home / Self Care | Payer: PRIVATE HEALTH INSURANCE | Attending: Emergency Medicine | Admitting: Emergency Medicine

## 2013-08-10 ENCOUNTER — Encounter (HOSPITAL_COMMUNITY): Payer: Self-pay | Admitting: Emergency Medicine

## 2013-08-10 DIAGNOSIS — N182 Chronic kidney disease, stage 2 (mild): Secondary | ICD-10-CM | POA: Insufficient documentation

## 2013-08-10 DIAGNOSIS — Z8781 Personal history of (healed) traumatic fracture: Secondary | ICD-10-CM | POA: Insufficient documentation

## 2013-08-10 DIAGNOSIS — E119 Type 2 diabetes mellitus without complications: Secondary | ICD-10-CM | POA: Insufficient documentation

## 2013-08-10 DIAGNOSIS — Z9889 Other specified postprocedural states: Secondary | ICD-10-CM | POA: Insufficient documentation

## 2013-08-10 DIAGNOSIS — I2510 Atherosclerotic heart disease of native coronary artery without angina pectoris: Secondary | ICD-10-CM | POA: Insufficient documentation

## 2013-08-10 DIAGNOSIS — F17200 Nicotine dependence, unspecified, uncomplicated: Secondary | ICD-10-CM | POA: Insufficient documentation

## 2013-08-10 DIAGNOSIS — Z950 Presence of cardiac pacemaker: Secondary | ICD-10-CM | POA: Insufficient documentation

## 2013-08-10 DIAGNOSIS — Q279 Congenital malformation of peripheral vascular system, unspecified: Secondary | ICD-10-CM | POA: Insufficient documentation

## 2013-08-10 DIAGNOSIS — N400 Enlarged prostate without lower urinary tract symptoms: Secondary | ICD-10-CM | POA: Insufficient documentation

## 2013-08-10 DIAGNOSIS — E785 Hyperlipidemia, unspecified: Secondary | ICD-10-CM | POA: Insufficient documentation

## 2013-08-10 DIAGNOSIS — K219 Gastro-esophageal reflux disease without esophagitis: Secondary | ICD-10-CM | POA: Insufficient documentation

## 2013-08-10 DIAGNOSIS — Z862 Personal history of diseases of the blood and blood-forming organs and certain disorders involving the immune mechanism: Secondary | ICD-10-CM | POA: Insufficient documentation

## 2013-08-10 DIAGNOSIS — IMO0002 Reserved for concepts with insufficient information to code with codable children: Secondary | ICD-10-CM | POA: Insufficient documentation

## 2013-08-10 DIAGNOSIS — I129 Hypertensive chronic kidney disease with stage 1 through stage 4 chronic kidney disease, or unspecified chronic kidney disease: Secondary | ICD-10-CM | POA: Insufficient documentation

## 2013-08-10 DIAGNOSIS — Z86718 Personal history of other venous thrombosis and embolism: Secondary | ICD-10-CM | POA: Insufficient documentation

## 2013-08-10 DIAGNOSIS — R739 Hyperglycemia, unspecified: Secondary | ICD-10-CM

## 2013-08-10 DIAGNOSIS — Z79899 Other long term (current) drug therapy: Secondary | ICD-10-CM | POA: Insufficient documentation

## 2013-08-10 LAB — CBC WITH DIFFERENTIAL/PLATELET
BASOS PCT: 0 % (ref 0–1)
Basophils Absolute: 0 10*3/uL (ref 0.0–0.1)
EOS ABS: 0.2 10*3/uL (ref 0.0–0.7)
Eosinophils Relative: 4 % (ref 0–5)
HEMATOCRIT: 44.4 % (ref 39.0–52.0)
HEMOGLOBIN: 15.6 g/dL (ref 13.0–17.0)
LYMPHS ABS: 0.8 10*3/uL (ref 0.7–4.0)
Lymphocytes Relative: 13 % (ref 12–46)
MCH: 30.1 pg (ref 26.0–34.0)
MCHC: 35.1 g/dL (ref 30.0–36.0)
MCV: 85.5 fL (ref 78.0–100.0)
MONO ABS: 0.4 10*3/uL (ref 0.1–1.0)
MONOS PCT: 6 % (ref 3–12)
NEUTROS PCT: 77 % (ref 43–77)
Neutro Abs: 4.9 10*3/uL (ref 1.7–7.7)
Platelets: 124 10*3/uL — ABNORMAL LOW (ref 150–400)
RBC: 5.19 MIL/uL (ref 4.22–5.81)
RDW: 13.9 % (ref 11.5–15.5)
WBC: 6.4 10*3/uL (ref 4.0–10.5)

## 2013-08-10 LAB — COMPREHENSIVE METABOLIC PANEL
ALK PHOS: 114 U/L (ref 39–117)
ALT: 57 U/L — ABNORMAL HIGH (ref 0–53)
AST: 37 U/L (ref 0–37)
Albumin: 4.3 g/dL (ref 3.5–5.2)
BILIRUBIN TOTAL: 1 mg/dL (ref 0.3–1.2)
BUN: 13 mg/dL (ref 6–23)
CO2: 24 mEq/L (ref 19–32)
CREATININE: 1.47 mg/dL — AB (ref 0.50–1.35)
Calcium: 10.1 mg/dL (ref 8.4–10.5)
Chloride: 91 mEq/L — ABNORMAL LOW (ref 96–112)
GFR calc Af Amer: 56 mL/min — ABNORMAL LOW (ref >90)
GFR calc non Af Amer: 48 mL/min — ABNORMAL LOW (ref >90)
Glucose, Bld: 568 mg/dL (ref 70–99)
Potassium: 4.1 mEq/L (ref 3.7–5.3)
Sodium: 129 mEq/L — ABNORMAL LOW (ref 137–147)
TOTAL PROTEIN: 7.6 g/dL (ref 6.0–8.3)

## 2013-08-10 LAB — CBG MONITORING, ED
GLUCOSE-CAPILLARY: 515 mg/dL — AB (ref 70–99)
Glucose-Capillary: 439 mg/dL — ABNORMAL HIGH (ref 70–99)

## 2013-08-10 LAB — URINALYSIS, ROUTINE W REFLEX MICROSCOPIC
Bilirubin Urine: NEGATIVE
Glucose, UA: >1000 mg/dL — AB
Hgb urine dipstick: NEGATIVE
KETONES UR: NEGATIVE mg/dL
Leukocytes, UA: NEGATIVE
Nitrite: NEGATIVE
PROTEIN: NEGATIVE mg/dL
Specific Gravity, Urine: 1.026 (ref 1.005–1.030)
Urobilinogen, UA: 0.2 mg/dL (ref 0.0–1.0)
pH: 7 (ref 5.0–8.0)

## 2013-08-10 LAB — DIGOXIN LEVEL: Digoxin Level: <0.3 ng/mL — ABNORMAL LOW (ref 0.8–2.0)

## 2013-08-10 LAB — PRO B NATRIURETIC PEPTIDE: Pro B Natriuretic peptide (BNP): 1181 pg/mL — ABNORMAL HIGH (ref 0–125)

## 2013-08-10 LAB — URINE MICROSCOPIC-ADD ON

## 2013-08-10 MED ORDER — INSULIN GLARGINE 100 UNIT/ML ~~LOC~~ SOLN
10.0000 [IU] | Freq: Once | SUBCUTANEOUS | Status: AC
  Administered 2013-08-10: 10 [IU] via SUBCUTANEOUS
  Filled 2013-08-10: qty 0.1

## 2013-08-10 MED ORDER — INSULIN ASPART 100 UNIT/ML ~~LOC~~ SOLN
10.0000 [IU] | Freq: Once | SUBCUTANEOUS | Status: AC
  Administered 2013-08-10: 10 [IU] via SUBCUTANEOUS
  Filled 2013-08-10: qty 1

## 2013-08-10 MED ORDER — IBUPROFEN 200 MG PO TABS
400.0000 mg | ORAL_TABLET | Freq: Once | ORAL | Status: AC
  Administered 2013-08-10: 400 mg via ORAL
  Filled 2013-08-10: qty 2

## 2013-08-10 MED ORDER — ACETAMINOPHEN 500 MG PO TABS
1000.0000 mg | ORAL_TABLET | Freq: Once | ORAL | Status: AC
  Administered 2013-08-10: 1000 mg via ORAL
  Filled 2013-08-10: qty 2

## 2013-08-10 NOTE — ED Provider Notes (Signed)
CSN: [id number]     Arrival date & time 08/10/13  1628 History   First MD Initiated Contact with Patient 08/10/13 1639     Chief Complaint  Patient presents with  . Hyperglycemia  . Hypertension     (Consider location/radiation/quality/duration/timing/severity/associated sxs/prior Treatment) Patient is a 68 y.o. male presenting with hyperglycemia.  Hyperglycemia Severity:  Severe Onset quality:  Gradual Duration:  2 months Timing:  Intermittent Progression:  Waxing and waning Chronicity:  Chronic Current diabetic treatments:  presccrriibbeedd  iinnssuullin by his primary doctor last week, but has been afraid to use it. Context comment:  Patient states that his blood sugar and blood pressure have been out of control since having a permanent pacemaker placed 2 months ago Relieved by:  Nothing Ineffective treatments:  None tried Associated symptoms: dizziness   Associated symptoms: no abdominal pain, no chest pain, no dehydration, no fever, no nausea, no shortness of breath and no vomiting   Associated symptoms comment:  Headache started today   Past Medical History  Diagnosis Date  . Venous thromboembolism     DVTs in 2004, 2005, 2007, 8/10, 8/11. IVC filter 2005. Off coumadin 04/2011 secondary to rectal bleeding  . 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 Chronic venous insufficiency   . DM2 (diabetes mellitus, type 2)   . GERD (gastroesophageal reflux disease)   . BPH (benign prostatic hypertrophy)   . Low back pain   . HTN (hypertension)   . HLD (hyperlipidemia)   . NICM (nonischemic cardiomyopathy) 1. 9/11  2. 2/12    1. Echo septal and apical akinesis, dilated LV, EF 45%, RV normal size and systolic function, EF 43% on Myoview  2. Admitted with decompensated CHF, echo EF 30-35% with diffuse hypokinesis but akinesis of mid to apical anteroseptal wall and apex, moderate LVH, mild MR, grade I diastolic dysfunction, SPEP/UP negative and HIV negative.  Most recent EF 10-15% 04/2011  . Smoking   . CAD (coronary artery disease) 1. 2007  2. 10/11    1. Left heart cath with 40-50% stenosis in small RCA EF 50%  2. Lexiscan myoview EF 43%, global hypokinesis, possible small area of apical ischemia; LHC no angiographic CAD, no LV-gram done due to CKD   2. No angiographic CAD by cath 02/2010  . CKD (chronic kidney disease) stage 2, GFR 60-89 ml/min as of 02/2011  . Headache(784.0)   . Diabetes mellitus   . Blood transfusion   . Anemia   . Syncope     1. in setting of presumed orthostatic Hypotn 09/2011  . Right fibular fracture     1. 09/2011 - resulting from fall/syncope  . Closed right ankle fracture 10/10/2011   Past Surgical History  Procedure Laterality Date  . Cardiac catheterization    . Appendectomy    . Cholecystectomy    . Colonoscopy  05/01/2011    Procedure: COLONOSCOPY;  Surgeon: Louis Meckelobert D Kaplan, MD;  Location: [facility] ENDOSCOPY;  Service: Endoscopy;  Laterality: N/A;  . Orif ankle fracture  10/10/2011    Procedure: OPEN REDUCTION INTERNAL FIXATION (ORIF) ANKLE FRACTURE;  Surgeon: Eulas PostJoshua P Landau, MD;  Location: [facility] OR;  Service: Orthopedics;  Laterality: Right;  Right  Ankle Fracture ORIF   Family History  Problem Relation Age of Onset  . Coronary artery disease Neg Hx     Premature   History  Substance Use Topics  . Smoking status: Current Some Day Smoker -- 1.00 packs/day for  40 years    Types: Cigarettes    Last Attempt to Quit: 06/30/2011  . Smokeless tobacco: Never Used  . Alcohol Use: No    Review of Systems  Constitutional: Negative for fever.  HENT: Negative for congestion.   Respiratory: Negative for cough and shortness of breath.   Cardiovascular: Negative for chest pain.  Gastrointestinal: Negative for nausea, vomiting, abdominal pain and diarrhea.  Neurological: Positive for dizziness.  All other systems reviewed and are negative.      Allergies  Review of patient's allergies indicates no known  allergies.  Home Medications   Current Outpatient Rx  Name  Route  Sig  Dispense  Refill  . allopurinol (ZYLOPRIM) 100 MG tablet   Oral   Take 3 tablets (300 mg total) by mouth daily. Take 3 tablets (300 mg) by mouth daily   90 tablet   6   . atorvastatin (LIPITOR) 20 MG tablet   Oral   Take 20 mg by mouth every morning.          . clobetasol cream (TEMOVATE) 0.05 %   Topical   Apply 1 application topically 4 (four) times daily as needed (for rash.).   30 g   0   . digoxin (DIGOX) 0.125 MG tablet   Oral   Take 1 tablet (0.125 mg total) by mouth daily.   30 tablet   6   . famotidine (PEPCID) 20 MG tablet   Oral   Take 1 tablet (20 mg total) by mouth daily.   20 tablet   0   . glipiZIDE (GLUCOTROL XL) 5 MG 24 hr tablet   Oral   Take 5 mg by mouth daily.          . hydrALAZINE (APRESOLINE) 10 MG tablet   Oral   Take 10 mg by mouth daily.         . isosorbide mononitrate (IMDUR) 30 MG 24 hr tablet   Oral   Take 30 mg by mouth daily.         . metolazone (ZAROXOLYN) 2.5 MG tablet   Oral   Take 2.5 mg by mouth daily as needed (for increased fluid.).         Marland Kitchen metoprolol succinate (TOPROL-XL) 25 MG 24 hr tablet   Oral   Take 12.5 mg by mouth daily.          Marland Kitchen NAFTIN 2 % CREA   Topical   Apply 1 application topically daily as needed (apply to foot fungus).          . pantoprazole (PROTONIX) 40 MG tablet   Oral   Take 1 tablet (40 mg total) by mouth daily.   30 tablet   4   . Tamsulosin HCl (FLOMAX) 0.4 MG CAPS   Oral   Take 1 capsule (0.4 mg total) by mouth at bedtime.   90 capsule   1   . torsemide (DEMADEX) 20 MG tablet   Oral   Take 1 tablet (20 mg total) by mouth every evening.   30 tablet   6    BP 171/95  Pulse 102  Temp(Src) 98.4 F (36.9 C)  Resp 18  SpO2 97% Physical Exam  Nursing note and vitals reviewed. Constitutional: He is oriented to person, place, and time. He appears well-developed and well-nourished. No  distress.  HENT:  Head: Normocephalic and atraumatic.  Mouth/Throat: Oropharynx is clear and moist.  Eyes: Conjunctivae are normal. Pupils are equal, round, and reactive  to light. No scleral icterus.  Neck: Neck supple.  Cardiovascular: Normal rate, regular rhythm, normal heart sounds and intact distal pulses.   No murmur heard. Pulmonary/Chest: Effort normal and breath sounds normal. No stridor. No respiratory distress. He has no wheezes. He has no rales.  Pacemaker site is nontender and not erythematous  Abdominal: Soft. He exhibits no distension. There is no tenderness.  Musculoskeletal: Normal range of motion. He exhibits no edema.  Neurological: He is alert and oriented to person, place, and time.  Skin: Skin is warm and dry. No rash noted.  Psychiatric: He has a normal mood and affect. His behavior is normal.    ED Course  Procedures (including critical care time) Labs Review Labs Reviewed  CBC WITH DIFFERENTIAL - Abnormal; Notable for the following:    Platelets 124 (*)    All other components within normal limits  COMPREHENSIVE METABOLIC PANEL - Abnormal; Notable for the following:    Sodium 129 (*)    Chloride 91 (*)    Glucose, Bld 568 (*)    Creatinine, Ser 1.47 (*)    ALT 57 (*)    GFR calc non Af Amer 48 (*)    GFR calc Af Amer 56 (*)    All other components within normal limits  URINALYSIS, ROUTINE W REFLEX MICROSCOPIC - Abnormal; Notable for the following:    Glucose, UA >1000 (*)    All other components within normal limits  PRO B NATRIURETIC PEPTIDE - Abnormal; Notable for the following:    Pro B Natriuretic peptide (BNP) 1181.0 (*)    All other components within normal limits  DIGOXIN LEVEL - Abnormal; Notable for the following:    Digoxin Level <0.3 (*)    All other components within normal limits  CBG MONITORING, ED - Abnormal; Notable for the following:    Glucose-Capillary 515 (*)    All other components within normal limits  CBG MONITORING, ED -  Abnormal; Notable for the following:    Glucose-Capillary 439 (*)    All other components within normal limits  URINE MICROSCOPIC-ADD ON   Imaging Review No results found.   EKG Interpretation   Date/Time:  Wednesday August 10 2013 17:23:11 EDT Ventricular Rate:  97 PR Interval:  167 QRS Duration: 93 QT Interval:  299 QTC Calculation: 380 R Axis:   86 Text Interpretation:  Sinus rhythm Borderline right axis deviation  Probable LVH with secondary repol abnrm Baseline wander in lead(s) V4 V5  No significant change was found Confirmed by [facility]  MD, TREY ([id number]) on  08/10/2013 5:58:11 PM      MDM   Final diagnoses:  Hyperglycemia    68 yo male presenting with hyperglycemia.  He has been having difficulty controlling his blood sugar for the past few months.  His PCP prescribed Lantus about a week ago, but patient has not begun taking it.  I agree with his PCP that he should be taking this insulin.  He has no signs of complications from his hyperglycemia.  Not given IV fluids due to poor EF.  Given SQ insulin which began to improve sugars.  Given dose on Lantus and instructed to follow up closely with PCP.  Pt comfortable with plan.     Candyce Churn III, MD 08/10/13 2234

## 2013-08-10 NOTE — ED Notes (Signed)
Patient presents from home via EMS with a chief complaint of hyperglycemia and hypertension. Patient went to his PCP today and had CBG reading of "high" EMS checked and got 411. Patient reports he's not had his insulin since January.

## 2013-08-10 NOTE — ED Notes (Signed)
Bed: WA11 Expected date:  Expected time:  Means of arrival:  Comments: EMS 

## 2013-08-10 NOTE — Discharge Instructions (Signed)

## 2013-09-28 ENCOUNTER — Encounter: Payer: Self-pay | Admitting: Internal Medicine

## 2013-09-28 ENCOUNTER — Ambulatory Visit (INDEPENDENT_AMBULATORY_CARE_PROVIDER_SITE_OTHER): Payer: PRIVATE HEALTH INSURANCE | Admitting: Internal Medicine

## 2013-09-28 VITALS — BP 134/90 | HR 88 | Ht 72.0 in | Wt 164.0 lb

## 2013-09-28 DIAGNOSIS — I428 Other cardiomyopathies: Secondary | ICD-10-CM

## 2013-09-28 DIAGNOSIS — Z95810 Presence of automatic (implantable) cardiac defibrillator: Secondary | ICD-10-CM

## 2013-09-28 DIAGNOSIS — I5043 Acute on chronic combined systolic (congestive) and diastolic (congestive) heart failure: Secondary | ICD-10-CM

## 2013-09-28 DIAGNOSIS — I509 Heart failure, unspecified: Secondary | ICD-10-CM

## 2013-09-28 DIAGNOSIS — I5022 Chronic systolic (congestive) heart failure: Secondary | ICD-10-CM

## 2013-09-28 NOTE — Patient Instructions (Signed)
Your physician wants you to follow-up in: 9 months with Dr Court Joy will receive a reminder letter in the mail two months in advance. If you don't receive a letter, please call our office to schedule the follow-up appointment.   Remote monitoring is used to monitor your Pacemaker of ICD from home. This monitoring reduces the number of office visits required to check your device to one time per year. It allows [place] to keep an eye on the functioning of your device to ensure it is working properly. You are scheduled for a device check from home on 12/28/13. You may send your transmission at any time that day. If you have a wireless device, the transmission will be sent automatically. After your physician reviews your transmission, you will receive a postcard with your next transmission date.

## 2013-09-28 NOTE — Progress Notes (Signed)
HPI [name] returns today for followup. He is a pleasant 68 yo man with chronic systolic heart failure, s/p ICD implant. He has done well in the interim. He denies chest pain or sob. No syncope. No Known Allergies   Current Outpatient Prescriptions  Medication Sig Dispense Refill  . allopurinol (ZYLOPRIM) 100 MG tablet Take 3 tablets (300 mg total) by mouth daily. Take 3 tablets (300 mg) by mouth daily  90 tablet  6  . atorvastatin (LIPITOR) 20 MG tablet Take 20 mg by mouth every morning.       . clobetasol cream (TEMOVATE) 0.05 % Apply 1 application topically 4 (four) times daily as needed (for rash.).  30 g  0  . digoxin (DIGOX) 0.125 MG tablet Take 1 tablet (0.125 mg total) by mouth daily.  30 tablet  6  . famotidine (PEPCID) 20 MG tablet Take 1 tablet (20 mg total) by mouth daily.  20 tablet  0  . hydrALAZINE (APRESOLINE) 10 MG tablet Take 10 mg by mouth daily.      . isosorbide mononitrate (IMDUR) 30 MG 24 hr tablet Take 30 mg by mouth daily.      Marland Kitchen. LANTUS SOLOSTAR 100 UNIT/ML Solostar Pen 30 Units daily.      . metolazone (ZAROXOLYN) 2.5 MG tablet Take 2.5 mg by mouth daily as needed (for increased fluid.).      Marland Kitchen. metoprolol succinate (TOPROL-XL) 25 MG 24 hr tablet Take 12.5 mg by mouth daily.       . nateglinide (STARLIX) 60 MG tablet Take 120 mg by mouth 3 (three) times daily.       Marland Kitchen. NEXIUM 40 MG capsule Take 1 capsule by mouth daily.      . Tamsulosin HCl (FLOMAX) 0.4 MG CAPS Take 1 capsule (0.4 mg total) by mouth at bedtime.  90 capsule  1  . torsemide (DEMADEX) 20 MG tablet Take 1 tablet (20 mg total) by mouth every evening.  30 tablet  6   No current facility-administered medications for this visit.     Past Medical History  Diagnosis Date  . Venous thromboembolism     DVTs in 2004, 2005, 2007, 8/10, 8/11. IVC filter 2005. Off coumadin 04/2011 secondary to rectal bleeding  . 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 Chronic venous  insufficiency   . DM2 (diabetes mellitus, type 2)   . GERD (gastroesophageal reflux disease)   . BPH (benign prostatic hypertrophy)   . Low back pain   . HTN (hypertension)   . HLD (hyperlipidemia)   . NICM (nonischemic cardiomyopathy) 1. 9/11  2. 2/12    1. Echo septal and apical akinesis, dilated LV, EF 45%, RV normal size and systolic function, EF 43% on Myoview  2. Admitted with decompensated CHF, echo EF 30-35% with diffuse hypokinesis but akinesis of mid to apical anteroseptal wall and apex, moderate LVH, mild MR, grade I diastolic dysfunction, SPEP/UP negative and HIV negative. Most recent EF 10-15% 04/2011  . Smoking   . CAD (coronary artery disease) 1. 2007  2. 10/11    1. Left heart cath with 40-50% stenosis in small RCA EF 50%  2. Lexiscan myoview EF 43%, global hypokinesis, possible small area of apical ischemia; LHC no angiographic CAD, no LV-gram done due to CKD   2. No angiographic CAD by cath 02/2010  . CKD (chronic kidney disease) stage 2, GFR 60-89 ml/min as of 02/2011  .  Headache(784.0)   . Diabetes mellitus   . Blood transfusion   . Anemia   . Syncope     1. in setting of presumed orthostatic Hypotn 09/2011  . Right fibular fracture     1. 09/2011 - resulting from fall/syncope  . Closed right ankle fracture 10/10/2011    ROS:   All systems reviewed and negative except as noted in the HPI.   Past Surgical History  Procedure Laterality Date  . Cardiac catheterization    . Appendectomy    . Cholecystectomy    . Colonoscopy  05/01/2011    Procedure: COLONOSCOPY;  Surgeon: Louis Meckel, MD;  Location: [facility] ENDOSCOPY;  Service: Endoscopy;  Laterality: N/A;  . Orif ankle fracture  10/10/2011    Procedure: OPEN REDUCTION INTERNAL FIXATION (ORIF) ANKLE FRACTURE;  Surgeon: Eulas Post, MD;  Location: [facility] OR;  Service: Orthopedics;  Laterality: Right;  Right  Ankle Fracture ORIF     Family History  Problem Relation Age of Onset  . Coronary artery disease Neg Hx      Premature     History   Social History  . Marital Status: Divorced    Spouse Name: N/A    Number of Children: 1  . Years of Education: N/A   Occupational History  . security guard     Part time   Social History Main Topics  . Smoking status: Current Some Day Smoker -- 1.00 packs/day for 40 years    Types: Cigarettes    Last Attempt to Quit: 06/30/2011  . Smokeless tobacco: Never Used  . Alcohol Use: No  . Drug Use: No  . Sexual Activity: Not on file   Other Topics Concern  . Not on file   Social History Narrative   Has 1 son.     BP 134/90  Pulse 88  Ht 6' (1.829 m)  Wt 164 lb (74.39 kg)  BMI 22.24 kg/m2  Physical Exam:  Well appearing 68 yo man, NAD HEENT: Unremarkable Neck:  7 cm JVD, no thyromegally Back:  No CVA tenderness Lungs:  Clear with no wheezes HEART:  Regular rate rhythm, no murmurs, no rubs, no clicks Abd:  soft, positive bowel sounds, no organomegally, no rebound, no guarding Ext:  2 plus pulses, no edema, no cyanosis, no clubbing Skin:  No rashes no nodules Neuro:  CN II through XII intact, motor grossly intact   DEVICE  Normal device function.  See PaceArt for details.   Assess/Plan:

## 2013-09-28 NOTE — Assessment & Plan Note (Signed)
His chronic systolic heart failure is clasrs 2. He will continue his current meds. I have asked the patient to reduce his sodium intake.

## 2013-09-28 NOTE — Assessment & Plan Note (Signed)
His single chamber device is working normally. Will recheck in several months.

## 2013-09-29 LAB — MDC_IDC_ENUM_SESS_TYPE_INCLINIC
Battery Remaining Longevity: 94.8 mo
Date Time Interrogation Session: 20150513184132
HIGH POWER IMPEDANCE MEASURED VALUE: 87 Ohm
Lead Channel Impedance Value: 600 Ohm
Lead Channel Pacing Threshold Amplitude: 0.75 V
Lead Channel Pacing Threshold Pulse Width: 0.5 ms
Lead Channel Sensing Intrinsic Amplitude: 12 mV
Lead Channel Setting Pacing Amplitude: 2.5 V
Lead Channel Setting Sensing Sensitivity: 0.5 mV
MDC IDC MSMT LEADCHNL RV PACING THRESHOLD AMPLITUDE: 0.75 V
MDC IDC MSMT LEADCHNL RV PACING THRESHOLD PULSEWIDTH: 0.5 ms
MDC IDC PG SERIAL: 7155321
MDC IDC SET LEADCHNL RV PACING PULSEWIDTH: 0.5 ms
MDC IDC SET ZONE DETECTION INTERVAL: 280 ms
MDC IDC STAT BRADY RV PERCENT PACED: 0 %
Zone Setting Detection Interval: 250 ms
Zone Setting Detection Interval: 335 ms

## 2013-10-03 ENCOUNTER — Encounter (HOSPITAL_COMMUNITY): Payer: Self-pay | Admitting: Cardiology

## 2013-10-03 ENCOUNTER — Telehealth (HOSPITAL_COMMUNITY): Payer: Self-pay | Admitting: Cardiology

## 2013-10-03 NOTE — Telephone Encounter (Signed)
Attempting to schedule 2 month follow up I have been unable to reach this patient by phone.  A letter is being sent to the last known home address.

## 2013-10-17 ENCOUNTER — Ambulatory Visit (HOSPITAL_COMMUNITY)
Admission: RE | Admit: 2013-10-17 | Discharge: 2013-10-17 | Disposition: A | Discharge status: Home / Self Care | Payer: PRIVATE HEALTH INSURANCE | Source: Ambulatory Visit | Attending: Internal Medicine | Admitting: Internal Medicine

## 2013-10-17 ENCOUNTER — Encounter (HOSPITAL_COMMUNITY): Payer: Self-pay

## 2013-10-17 VITALS — BP 104/76 | HR 82 | Resp 18 | Wt 150.4 lb

## 2013-10-17 DIAGNOSIS — N182 Chronic kidney disease, stage 2 (mild): Secondary | ICD-10-CM | POA: Insufficient documentation

## 2013-10-17 DIAGNOSIS — I2510 Atherosclerotic heart disease of native coronary artery without angina pectoris: Secondary | ICD-10-CM | POA: Insufficient documentation

## 2013-10-17 DIAGNOSIS — I10 Essential (primary) hypertension: Secondary | ICD-10-CM

## 2013-10-17 DIAGNOSIS — E785 Hyperlipidemia, unspecified: Secondary | ICD-10-CM | POA: Diagnosis not present

## 2013-10-17 DIAGNOSIS — I509 Heart failure, unspecified: Secondary | ICD-10-CM | POA: Insufficient documentation

## 2013-10-17 DIAGNOSIS — N189 Chronic kidney disease, unspecified: Secondary | ICD-10-CM

## 2013-10-17 DIAGNOSIS — E119 Type 2 diabetes mellitus without complications: Secondary | ICD-10-CM | POA: Diagnosis not present

## 2013-10-17 DIAGNOSIS — I129 Hypertensive chronic kidney disease with stage 1 through stage 4 chronic kidney disease, or unspecified chronic kidney disease: Secondary | ICD-10-CM | POA: Diagnosis not present

## 2013-10-17 DIAGNOSIS — I5043 Acute on chronic combined systolic (congestive) and diastolic (congestive) heart failure: Secondary | ICD-10-CM

## 2013-10-17 DIAGNOSIS — I5022 Chronic systolic (congestive) heart failure: Secondary | ICD-10-CM | POA: Diagnosis not present

## 2013-10-17 LAB — BASIC METABOLIC PANEL
BUN: 18 mg/dL (ref 6–23)
CALCIUM: 9.9 mg/dL (ref 8.4–10.5)
CO2: 27 mEq/L (ref 19–32)
CREATININE: 1.56 mg/dL — AB (ref 0.50–1.35)
Chloride: 91 mEq/L — ABNORMAL LOW (ref 96–112)
GFR calc Af Amer: 52 mL/min — ABNORMAL LOW (ref >90)
GFR calc non Af Amer: 45 mL/min — ABNORMAL LOW (ref >90)
Glucose, Bld: 542 mg/dL — ABNORMAL HIGH (ref 70–99)
Potassium: 4.5 mEq/L (ref 3.7–5.3)
Sodium: 131 mEq/L — ABNORMAL LOW (ref 137–147)

## 2013-10-17 MED ORDER — LISINOPRIL 5 MG PO TABS: 5.0000 mg | ORAL_TABLET | Freq: Every evening | ORAL | Status: DC

## 2013-10-17 NOTE — Patient Instructions (Signed)
Doing great.  Start lisinopril 5 mg (1 tablet) at night. Call me if you have any dizziness.  Continue all other medications as prescribed.  Follow up in 7-10 days for lab work.  Come back to clinic in 3 months.  Do the following things EVERYDAY: 1) Weigh yourself in the morning before breakfast. Write it down and keep it in a log. 2) Take your medicines as prescribed 3) Eat low salt foods-Limit salt (sodium) to 2000 mg per day.  4) Stay as active as you can everyday 5) Limit all fluids for the day to less than 2 liters 6)

## 2013-10-17 NOTE — Progress Notes (Signed)
Patient ID: [name], male   DOB: 1945/11/12, 68 y.o.   MRN: [id number]  PCP: Dr. Jeanelle Malling [name] Urologist: Dr [name] EP: Dr. Ladona [name]  HPI:  [name] is a 68 year old male with chronic systolic CHF secondary to NICM echo EF 30-35%, HTN, anemia, chronic renal insufficiency, UGI bleed, duodenal AVMs,  rectal bleeding 04/2011,  DVTs (2004, 2005, 2007, 2010, 2011) however coumadin on hold due to GI bleed, IVC filter 2005, GERD, BPD, Urinary Retention, and chronic low back.  09/2011 S/P ORIF L ankle. S/P ICD January 2015.  Spironolactone switched to eplerenone due to gynecomastia.  Admitted from HF clinic 05/30/2012 with volume overload. Diuresed with Milrinone and Lasix. Diuresed 41 pounds. Milrinone and lasix gtt weaned off. CO-OX dropped 43. Beta blocker was stopped due to low output. He was offered home Milrinone however he declined.  Discharge weight 175 pounds.    RHC 2/13 RA = 4  RV = 32/6 (7)  PA = 45/15 (24)  PCW = 7  Fick cardiac output/index = 5.2/2.7  Thermo = 4.0/2.1  PVR = 3.4 Woods  FA sat = 94%  PA sat = 56%, 57%  Noninvasive BP : 125/92 (72)  Calculated SVR = 1040  08/13/11 CPX  RER 1.16 Peak VO2 18.6 ml/kg/min Predicted 61.4% VE/VCO2 slope 42.8 08/23/2011 ECHO EF 30-35% 05/09/13 ECHO EF 20-25% RV mod-severely hypokinetic   Follow up for Heart Failure: Since last visit came to ED with hyperglycemia and was given insulin and followed up with PCP. Now on lantus at night and reports blood sugars much improved 100-115. Denies SOB, orthopnea, PND or CP.  Walking about everyday 15-20 min two times a day with no issues. Able to go up hills and stairs with no issues. Weight at home 140-150 lbs. Following a low salt diet and drinking less than 2L a day. Taking medications as prescribed.   Labs 02/23/13 K 3.9 Creatinine 1.58 Dig level 0.9  Labs 05/31/13 K 4.0 Creatinine  1.6   SH: Lives alone, quit smoking about a month ago (08/2013), no ETOH or drugs FH: Mother deceased:  stroke, HTN,         Father deceased: no issues, died from intracranial bleed from fall        4 sisters and 3 brothers: no CAD   ROS: All systems negative except as listed in HPI, PMH and Problem List.  Past Medical History  Diagnosis Date  . Venous thromboembolism     DVTs in 2004, 2005, 2007, 8/10, 8/11. IVC filter 2005. Off coumadin 04/2011 secondary to rectal bleeding  . 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 Chronic venous insufficiency   . DM2 (diabetes mellitus, type 2)   . GERD (gastroesophageal reflux disease)   . BPH (benign prostatic hypertrophy)   . Low back pain   . HTN (hypertension)   . HLD (hyperlipidemia)   . NICM (nonischemic cardiomyopathy) 1. 9/11  2. 2/12    1. Echo septal and apical akinesis, dilated LV, EF 45%, RV normal size and systolic function, EF 43% on Myoview  2. Admitted with decompensated CHF, echo EF 30-35% with diffuse hypokinesis but akinesis of mid to apical anteroseptal wall and apex, moderate LVH, mild MR, grade I diastolic dysfunction, SPEP/UP negative and HIV negative. Most recent EF 10-15% 04/2011  . Smoking   . CAD (coronary artery disease) 1. 2007  2. 10/11    1. Left heart cath with 40-50% stenosis in  small RCA EF 50%  2. Lexiscan myoview EF 43%, global hypokinesis, possible small area of apical ischemia; LHC no angiographic CAD, no LV-gram done due to CKD   2. No angiographic CAD by cath 02/2010  . CKD (chronic kidney disease) stage 2, GFR 60-89 ml/min as of 02/2011  . Headache(784.0)   . Diabetes mellitus   . Blood transfusion   . Anemia   . Syncope     1. in setting of presumed orthostatic Hypotn 09/2011  . Right fibular fracture     1. 09/2011 - resulting from fall/syncope  . Closed right ankle fracture 10/10/2011    Current Outpatient Prescriptions  Medication Sig Dispense Refill  . allopurinol (ZYLOPRIM) 100 MG tablet Take 3 tablets (300 mg total) by mouth daily. Take 3 tablets (300 mg) by mouth daily  90  tablet  6  . atorvastatin (LIPITOR) 20 MG tablet Take 20 mg by mouth every morning.       . clobetasol cream (TEMOVATE) 0.05 % Apply 1 application topically 4 (four) times daily as needed (for rash.).  30 g  0  . digoxin (DIGOX) 0.125 MG tablet Take 1 tablet (0.125 mg total) by mouth daily.  30 tablet  6  . eplerenone (INSPRA) 25 MG tablet Take 25 mg by mouth daily.      . famotidine (PEPCID) 20 MG tablet Take 1 tablet (20 mg total) by mouth daily.  20 tablet  0  . hydrALAZINE (APRESOLINE) 10 MG tablet Take 10 mg by mouth 3 (three) times daily.       . isosorbide mononitrate (IMDUR) 30 MG 24 hr tablet Take 30 mg by mouth daily.      Marland Kitchen LANTUS SOLOSTAR 100 UNIT/ML Solostar Pen 30 Units daily.      . metolazone (ZAROXOLYN) 2.5 MG tablet Take 2.5 mg by mouth daily as needed (for increased fluid.).      Marland Kitchen metoprolol succinate (TOPROL-XL) 25 MG 24 hr tablet Take 12.5 mg by mouth daily.       . nateglinide (STARLIX) 60 MG tablet Take 120 mg by mouth 3 (three) times daily.       Marland Kitchen NEXIUM 40 MG capsule Take 1 capsule by mouth daily.      . Tamsulosin HCl (FLOMAX) 0.4 MG CAPS Take 1 capsule (0.4 mg total) by mouth at bedtime.  90 capsule  1  . torsemide (DEMADEX) 20 MG tablet Take 1 tablet (20 mg total) by mouth every evening.  30 tablet  6   No current facility-administered medications for this encounter.    Filed Vitals:   10/17/13 1457  BP: 104/76  Pulse: 82  Resp: 18  Weight: 150 lb 6 oz (68.21 kg)  SpO2: 100%   PHYSICAL EXAM: General: Well appearing.  No acute distress.  HEENT: normal except for poor dentition Neck: supple. JVP flat Carotids 2+ bilaterally; no bruits. No lymphadenopathy or thryomegaly appreciated. Cor: PMI displaced laterally. Regular rate and rhythm.  Lungs: clear Abdomen: Non tender. Good bowel sounds. Non distended Extremities: no cyanosis, clubbing, rash,edema Neuro: alert & orientedx3, cranial nerves grossly intact. Moves all 4 extremities w/o difficulty. Affect  pleasant.    ASSESSMENT & PLAN:  1) Chronic systolic HF:  NICM s/p [employer]. Jude ICD, EF 20-25% (04/2013)   - NYHA II symptoms and volume status stable. Will continue torsemide 20 mg daily. Check BMET today. - Patient reports taking Toprol 12.5 mg daily, however last note said Toprol 25 mg q am and 12.5 mg  q pm. There is some other confusion about medications. Will have him go home and call clinic with medication dosages. Eplerenonewas no longer on his list but he thinks he is taking 25 mg daily which I would continue. Had gynecomastia on Spiro.  - Hydralazine is listed 10 mg daily, however should be taking TID. Continue Imdur 30 mg daily  - Continue digoxin 0.125 mg daily, last dig level <0.3 (07/2013) - Will try to start low dose ACE-I, lisinopril 5 mg daily at night. As above BMET today and then in 7-10 days.  - Reinforced the need and importance of daily weights, a low sodium diet, and fluid restriction (less than 2 L a day). Instructed to call the HF clinic if weight increases more than 3 lbs overnight or 5 lbs in a week.  2) CRI stage 3  - Baseline creatinine 1.5-1.8. Will try to start low dose lisinopril 5 mg daily. Check BMET today and in 7-10 days. 3) Recurrent DVTs - No signs or symptoms of DVT - Not on Coumadin d/t hx GIB and he refuses all antocoagulants 4) DM2 - Continue to follow with PCP.     F/U 3 months.  Aundria RudAli B Prinston Kynard NP-C 3:11 PM

## 2013-10-18 ENCOUNTER — Telehealth (HOSPITAL_COMMUNITY): Payer: Self-pay

## 2013-10-18 NOTE — Telephone Encounter (Signed)
Patient made aware of lab results, specifically concerned about glucose.  Advised to make appointment with PCP, results faxed to PCP.  Aware and agreeable. Ave Filter

## 2013-10-25 ENCOUNTER — Telehealth (HOSPITAL_COMMUNITY): Payer: Self-pay | Admitting: Cardiology

## 2013-10-25 ENCOUNTER — Ambulatory Visit (HOSPITAL_COMMUNITY)
Admission: RE | Admit: 2013-10-25 | Discharge: 2013-10-25 | Disposition: A | Discharge status: Home / Self Care | Payer: PRIVATE HEALTH INSURANCE | Source: Ambulatory Visit | Attending: Cardiology | Admitting: Cardiology

## 2013-10-25 DIAGNOSIS — I509 Heart failure, unspecified: Secondary | ICD-10-CM | POA: Insufficient documentation

## 2013-10-25 DIAGNOSIS — I5023 Acute on chronic systolic (congestive) heart failure: Secondary | ICD-10-CM | POA: Diagnosis not present

## 2013-10-25 DIAGNOSIS — I5022 Chronic systolic (congestive) heart failure: Secondary | ICD-10-CM

## 2013-10-25 LAB — BASIC METABOLIC PANEL
BUN: 17 mg/dL (ref 6–23)
CALCIUM: 10 mg/dL (ref 8.4–10.5)
CO2: 26 meq/L (ref 19–32)
CREATININE: 1.56 mg/dL — AB (ref 0.50–1.35)
Chloride: 93 mEq/L — ABNORMAL LOW (ref 96–112)
GFR calc Af Amer: 52 mL/min — ABNORMAL LOW (ref >90)
GFR, EST NON AFRICAN AMERICAN: 45 mL/min — AB (ref >90)
Glucose, Bld: 579 mg/dL (ref 70–99)
Potassium: 4.5 mEq/L (ref 3.7–5.3)
SODIUM: 132 meq/L — AB (ref 137–147)

## 2013-10-25 NOTE — Telephone Encounter (Signed)
Lab call to report critical glucose= [phone] Advised pt to contact PCP regarding insulin dosing for tonight  Pt agreeable

## 2013-12-28 ENCOUNTER — Encounter: Payer: PRIVATE HEALTH INSURANCE | Admitting: *Deleted

## 2013-12-28 ENCOUNTER — Telehealth: Payer: Self-pay | Admitting: Cardiology

## 2013-12-28 NOTE — Telephone Encounter (Signed)
Spoke with pt and reminded pt of remote transmission that is due today. Pt verbalized understanding.   

## 2013-12-29 ENCOUNTER — Encounter: Payer: Self-pay | Admitting: Cardiology

## 2014-01-18 ENCOUNTER — Inpatient Hospital Stay (HOSPITAL_COMMUNITY): Admission: RE | Admit: 2014-01-18 | Payer: PRIVATE HEALTH INSURANCE | Source: Ambulatory Visit

## 2014-01-30 ENCOUNTER — Other Ambulatory Visit (HOSPITAL_COMMUNITY): Payer: Self-pay | Admitting: Anesthesiology

## 2014-02-06 ENCOUNTER — Encounter: Payer: Self-pay | Admitting: Podiatry

## 2014-02-06 ENCOUNTER — Ambulatory Visit (INDEPENDENT_AMBULATORY_CARE_PROVIDER_SITE_OTHER): Payer: Medicare HMO | Admitting: Podiatry

## 2014-02-06 VITALS — BP 138/84 | HR 79 | Resp 16 | Ht 72.0 in | Wt 140.0 lb

## 2014-02-06 DIAGNOSIS — M79609 Pain in unspecified limb: Secondary | ICD-10-CM

## 2014-02-06 DIAGNOSIS — M79676 Pain in unspecified toe(s): Secondary | ICD-10-CM

## 2014-02-06 DIAGNOSIS — B351 Tinea unguium: Secondary | ICD-10-CM

## 2014-02-06 NOTE — Patient Instructions (Signed)
The vascular lab we'll contact you to schedule a lower extremity arterial Doppler  Diabetes and Foot Care Diabetes may cause you to have problems because of poor blood supply (circulation) to your feet and legs. This may cause the skin on your feet to become thinner, break easier, and heal more slowly. Your skin may become dry, and the skin may peel and crack. You may also have nerve damage in your legs and feet causing decreased feeling in them. You may not notice minor injuries to your feet that could lead to infections or more serious problems. Taking care of your feet is one of the most important things you can do for yourself.  HOME CARE INSTRUCTIONS  Wear shoes at all times, even in the house. Do not go barefoot. Bare feet are easily injured.  Check your feet daily for blisters, cuts, and redness. If you cannot see the bottom of your feet, use a mirror or ask someone for help.  Wash your feet with warm water (do not use hot water) and mild soap. Then pat your feet and the areas between your toes until they are completely dry. Do not soak your feet as this can dry your skin.  Apply a moisturizing lotion or petroleum jelly (that does not contain alcohol and is unscented) to the skin on your feet and to dry, brittle toenails. Do not apply lotion between your toes.  Trim your toenails straight across. Do not dig under them or around the cuticle. File the edges of your nails with an emery board or nail file.  Do not cut corns or calluses or try to remove them with medicine.  Wear clean socks or stockings every day. Make sure they are not too tight. Do not wear knee-high stockings since they may decrease blood flow to your legs.  Wear shoes that fit properly and have enough cushioning. To break in new shoes, wear them for just a few hours a day. This prevents you from injuring your feet. Always look in your shoes before you put them on to be sure there are no objects inside.  Do not cross your  legs. This may decrease the blood flow to your feet.  If you find a minor scrape, cut, or break in the skin on your feet, keep it and the skin around it clean and dry. These areas may be cleansed with mild soap and water. Do not cleanse the area with peroxide, alcohol, or iodine.  When you remove an adhesive bandage, be sure not to damage the skin around it.  If you have a wound, look at it several times a day to make sure it is healing.  Do not use heating pads or hot water bottles. They may burn your skin. If you have lost feeling in your feet or legs, you may not know it is happening until it is too late.  Make sure your health care provider performs a complete foot exam at least annually or more often if you have foot problems. Report any cuts, sores, or bruises to your health care provider immediately. SEEK MEDICAL CARE IF:   You have an injury that is not healing.  You have cuts or breaks in the skin.  You have an ingrown nail.  You notice redness on your legs or feet.  You feel burning or tingling in your legs or feet.  You have pain or cramps in your legs and feet.  Your legs or feet are numb.  Your feet   always feel cold. SEEK IMMEDIATE MEDICAL CARE IF:   There is increasing redness, swelling, or pain in or around a wound.  There is a red line that goes up your leg.  Pus is coming from a wound.  You develop a fever or as directed by your health care provider.  You notice a bad smell coming from an ulcer or wound. Document Released: 05/02/2000 Document Revised: 01/05/2013 Document Reviewed: 10/12/2012 ExitCare Patient Information 2015 [employer]. This information is not intended to replace advice given to you by your health care provider. Make sure you discuss any questions you have with your health care provider.  

## 2014-02-06 NOTE — Progress Notes (Signed)
   Subjective:    Patient ID: [name], male    DOB: 25-Apr-1946, 68 y.o.   MRN: [id number]  HPI Comments: N debridement L 10 toenails D and O long-term C elongated, thick toenails A diabetes and difficult to cut T hx of podiatric care     Review of Systems  All other systems reviewed and are negative.      Objective:   Physical Exam  Orientated x3 black male  Vascular: DP left 0/4 DP right 2/4 PT left 0/4 PT right 2/4  Neurological: Sensation to 10 g monofilament wire intact 2/5 bilaterally Vibratory sensation reactive bilaterally Ankle reflex equal and reactive bilaterally  Dermatological: Varicosities dorsal ankle and feet bilaterally The toenails are brittle, elongated, hypertrophic, discolored 6-10  Musculoskeletal: Pes planus bilaterally No restriction of ankle, subtalar, midtarsal joints bilaterally      Assessment & Plan:   Assessment: Diminished pedal pulses left suggesting possible peripheral arterial disease Diminished protective sensation bilaterally Symptomatic onychomycoses 6-10  Plan: Patient is referred for lower extremity arterial Doppler for the indication of diminished pedal pulses left and diabetes Notify patient of results arterial Doppler  Nails x10 are debrided without a bleeding  Reappoint at three-month intervals for nail debridement

## 2014-02-07 ENCOUNTER — Encounter: Payer: Self-pay | Admitting: Podiatry

## 2014-02-09 ENCOUNTER — Other Ambulatory Visit (HOSPITAL_COMMUNITY): Payer: Self-pay | Admitting: Cardiology

## 2014-02-09 DIAGNOSIS — R0989 Other specified symptoms and signs involving the circulatory and respiratory systems: Secondary | ICD-10-CM

## 2014-02-13 ENCOUNTER — Other Ambulatory Visit (HOSPITAL_COMMUNITY): Payer: Self-pay | Admitting: Cardiology

## 2014-02-13 ENCOUNTER — Ambulatory Visit (HOSPITAL_COMMUNITY): Payer: Medicare HMO | Attending: Internal Medicine | Admitting: Cardiology

## 2014-02-13 DIAGNOSIS — E119 Type 2 diabetes mellitus without complications: Secondary | ICD-10-CM | POA: Diagnosis not present

## 2014-02-13 DIAGNOSIS — E785 Hyperlipidemia, unspecified: Secondary | ICD-10-CM | POA: Insufficient documentation

## 2014-02-13 DIAGNOSIS — I739 Peripheral vascular disease, unspecified: Secondary | ICD-10-CM | POA: Insufficient documentation

## 2014-02-13 DIAGNOSIS — I10 Essential (primary) hypertension: Secondary | ICD-10-CM | POA: Insufficient documentation

## 2014-02-13 DIAGNOSIS — F17200 Nicotine dependence, unspecified, uncomplicated: Secondary | ICD-10-CM | POA: Insufficient documentation

## 2014-02-13 DIAGNOSIS — R0989 Other specified symptoms and signs involving the circulatory and respiratory systems: Secondary | ICD-10-CM

## 2014-02-13 DIAGNOSIS — E1159 Type 2 diabetes mellitus with other circulatory complications: Secondary | ICD-10-CM

## 2014-02-13 NOTE — Progress Notes (Signed)
Lower arterial doppler and bilateral duplex performed  

## 2014-02-21 ENCOUNTER — Telehealth: Payer: Self-pay | Admitting: *Deleted

## 2014-02-21 ENCOUNTER — Encounter: Payer: Self-pay | Admitting: *Deleted

## 2014-02-21 DIAGNOSIS — I739 Peripheral vascular disease, unspecified: Secondary | ICD-10-CM

## 2014-02-21 NOTE — Telephone Encounter (Signed)
Message copied by Enedina Finner on Tue Feb 21, 2014  5:27 PM ------      Message from: Carrington Clamp      Created: Mon Feb 20, 2014  8:01 AM       Abnormal arterial Doppler of the lower extremity dated 02/15/2014            Contact Dr.Arida to arrange followup evaluation      Notify patient that he followup is indicated for the vascular examination dated 02/15/2014 ------

## 2014-02-21 NOTE — Telephone Encounter (Signed)
I called and informed the patient that Dr. Leeanne Deed got the doppler results back and they were abnormal.  He wants you to follow-up with Dr. Kirke Corin at [facility] for a consultation.  I asked if he was okay with this.  He stated he was okay.  I told him we would send the referral and they should give you a call to make the appointment.  He stated okay.

## 2014-02-25 ENCOUNTER — Encounter (HOSPITAL_COMMUNITY): Payer: Self-pay | Admitting: Emergency Medicine

## 2014-02-25 ENCOUNTER — Emergency Department (HOSPITAL_COMMUNITY)
Admission: EM | Admit: 2014-02-25 | Discharge: 2014-02-25 | Disposition: A | Discharge status: Home / Self Care | Payer: Medicare HMO | Attending: Emergency Medicine | Admitting: Emergency Medicine

## 2014-02-25 DIAGNOSIS — N400 Enlarged prostate without lower urinary tract symptoms: Secondary | ICD-10-CM | POA: Diagnosis not present

## 2014-02-25 DIAGNOSIS — E119 Type 2 diabetes mellitus without complications: Secondary | ICD-10-CM | POA: Insufficient documentation

## 2014-02-25 DIAGNOSIS — Z9889 Other specified postprocedural states: Secondary | ICD-10-CM | POA: Insufficient documentation

## 2014-02-25 DIAGNOSIS — E785 Hyperlipidemia, unspecified: Secondary | ICD-10-CM | POA: Diagnosis not present

## 2014-02-25 DIAGNOSIS — N182 Chronic kidney disease, stage 2 (mild): Secondary | ICD-10-CM | POA: Diagnosis not present

## 2014-02-25 DIAGNOSIS — R739 Hyperglycemia, unspecified: Secondary | ICD-10-CM

## 2014-02-25 DIAGNOSIS — Z792 Long term (current) use of antibiotics: Secondary | ICD-10-CM | POA: Diagnosis not present

## 2014-02-25 DIAGNOSIS — Z79899 Other long term (current) drug therapy: Secondary | ICD-10-CM | POA: Insufficient documentation

## 2014-02-25 DIAGNOSIS — Z720 Tobacco use: Secondary | ICD-10-CM | POA: Diagnosis not present

## 2014-02-25 DIAGNOSIS — Z794 Long term (current) use of insulin: Secondary | ICD-10-CM | POA: Insufficient documentation

## 2014-02-25 DIAGNOSIS — L03211 Cellulitis of face: Secondary | ICD-10-CM | POA: Diagnosis not present

## 2014-02-25 DIAGNOSIS — I2510 Atherosclerotic heart disease of native coronary artery without angina pectoris: Secondary | ICD-10-CM | POA: Insufficient documentation

## 2014-02-25 DIAGNOSIS — Z86718 Personal history of other venous thrombosis and embolism: Secondary | ICD-10-CM | POA: Diagnosis not present

## 2014-02-25 DIAGNOSIS — I129 Hypertensive chronic kidney disease with stage 1 through stage 4 chronic kidney disease, or unspecified chronic kidney disease: Secondary | ICD-10-CM | POA: Diagnosis not present

## 2014-02-25 DIAGNOSIS — Z8781 Personal history of (healed) traumatic fracture: Secondary | ICD-10-CM | POA: Diagnosis not present

## 2014-02-25 DIAGNOSIS — Z8719 Personal history of other diseases of the digestive system: Secondary | ICD-10-CM | POA: Insufficient documentation

## 2014-02-25 DIAGNOSIS — Z862 Personal history of diseases of the blood and blood-forming organs and certain disorders involving the immune mechanism: Secondary | ICD-10-CM | POA: Insufficient documentation

## 2014-02-25 LAB — CBC WITH DIFFERENTIAL/PLATELET
Basophils Absolute: 0 10*3/uL (ref 0.0–0.1)
Basophils Relative: 1 % (ref 0–1)
Eosinophils Absolute: 0.1 10*3/uL (ref 0.0–0.7)
Eosinophils Relative: 2 % (ref 0–5)
HEMATOCRIT: 47.1 % (ref 39.0–52.0)
Hemoglobin: 15.9 g/dL (ref 13.0–17.0)
LYMPHS PCT: 10 % — AB (ref 12–46)
Lymphs Abs: 0.8 10*3/uL (ref 0.7–4.0)
MCH: 29.7 pg (ref 26.0–34.0)
MCHC: 33.8 g/dL (ref 30.0–36.0)
MCV: 87.9 fL (ref 78.0–100.0)
Monocytes Absolute: 0.5 10*3/uL (ref 0.1–1.0)
Monocytes Relative: 7 % (ref 3–12)
Neutro Abs: 6.5 10*3/uL (ref 1.7–7.7)
Neutrophils Relative %: 80 % — ABNORMAL HIGH (ref 43–77)
Platelets: 191 10*3/uL (ref 150–400)
RBC: 5.36 MIL/uL (ref 4.22–5.81)
RDW: 12.7 % (ref 11.5–15.5)
WBC: 8.1 10*3/uL (ref 4.0–10.5)

## 2014-02-25 LAB — KETONES, QUALITATIVE: Acetone, Bld: NEGATIVE

## 2014-02-25 LAB — CBG MONITORING, ED
GLUCOSE-CAPILLARY: 434 mg/dL — AB (ref 70–99)
Glucose-Capillary: 454 mg/dL — ABNORMAL HIGH (ref 70–99)

## 2014-02-25 LAB — BASIC METABOLIC PANEL
ANION GAP: 15 (ref 5–15)
BUN: 23 mg/dL (ref 6–23)
CHLORIDE: 84 meq/L — AB (ref 96–112)
CO2: 28 mEq/L (ref 19–32)
Calcium: 10.3 mg/dL (ref 8.4–10.5)
Creatinine, Ser: 1.72 mg/dL — ABNORMAL HIGH (ref 0.50–1.35)
GFR calc non Af Amer: 40 mL/min — ABNORMAL LOW (ref >90)
GFR, EST AFRICAN AMERICAN: 46 mL/min — AB (ref >90)
Glucose, Bld: 509 mg/dL — ABNORMAL HIGH (ref 70–99)
Potassium: 3.9 mEq/L (ref 3.7–5.3)
Sodium: 127 mEq/L — ABNORMAL LOW (ref 137–147)

## 2014-02-25 MED ORDER — OXYCODONE-ACETAMINOPHEN 5-325 MG PO TABS
1.0000 | ORAL_TABLET | Freq: Once | ORAL | Status: AC
  Administered 2014-02-25: 1 via ORAL
  Filled 2014-02-25: qty 1

## 2014-02-25 MED ORDER — SODIUM CHLORIDE 0.9 % IV BOLUS (SEPSIS)
1000.0000 mL | Freq: Once | INTRAVENOUS | Status: AC
  Administered 2014-02-25: 1000 mL via INTRAVENOUS

## 2014-02-25 MED ORDER — CLINDAMYCIN HCL 150 MG PO CAPS: 300.0000 mg | ORAL_CAPSULE | Freq: Three times a day (TID) | ORAL | Status: DC

## 2014-02-25 MED ORDER — INSULIN ASPART 100 UNIT/ML ~~LOC~~ SOLN
10.0000 [IU] | Freq: Once | SUBCUTANEOUS | Status: AC
  Administered 2014-02-25: 10 [IU] via SUBCUTANEOUS
  Filled 2014-02-25: qty 1

## 2014-02-25 NOTE — Discharge Instructions (Signed)
Please follow up with your primary care physician in 1-2 days. If you do not have one please call the [facility] number listed above. Please take your antibiotic until completion. Please read all discharge instructions and return precautions.  ° °Cellulitis °Cellulitis is an infection of the skin and the tissue beneath it. The infected area is usually red and tender. Cellulitis occurs most often in the arms and lower legs.  °CAUSES  °Cellulitis is caused by bacteria that enter the skin through cracks or cuts in the skin. The most common types of bacteria that cause cellulitis are staphylococci and streptococci. °SIGNS AND SYMPTOMS  °· Redness and warmth. °· Swelling. °· Tenderness or pain. °· Fever. °DIAGNOSIS  °Your health care provider can usually determine what is wrong based on a physical exam. Blood tests may also be done. °TREATMENT  °Treatment usually involves taking an antibiotic medicine. °HOME CARE INSTRUCTIONS  °· Take your antibiotic medicine as directed by your health care provider. Finish the antibiotic even if you start to feel better. °· Keep the infected arm or leg elevated to reduce swelling. °· Apply a warm cloth to the affected area up to 4 times per day to relieve pain. °· Take medicines only as directed by your health care provider. °· Keep all follow-up visits as directed by your health care provider. °SEEK MEDICAL CARE IF:  °· You notice red streaks coming from the infected area. °· Your red area gets larger or turns dark in color. °· Your bone or joint underneath the infected area becomes painful after the skin has healed. °· Your infection returns in the same area or another area. °· You notice a swollen bump in the infected area. °· You develop new symptoms. °· You have a fever. °SEEK IMMEDIATE MEDICAL CARE IF:  °· You feel very sleepy. °· You develop vomiting or diarrhea. °· You have a general ill feeling (malaise) with muscle aches and pains. °MAKE SURE YOU:   °· Understand these instructions. °· Will watch your condition. °· Will get help right away if you are not doing well or get worse. °Document Released: 02/12/2005 Document Revised: 09/19/2013 Document Reviewed: 07/21/2011 °ExitCare® Patient Information ©2015 [employer]. This information is not intended to replace advice given to you by your health care provider. Make sure you discuss any questions you have with your health care provider. ° ° ° °

## 2014-02-25 NOTE — ED Notes (Signed)
Preparing to move to A14

## 2014-02-25 NOTE — ED Notes (Signed)
Pt. reports multiple small red lumps at face and scalp onset several weeks ago with no drainage , denies fever or chills.

## 2014-02-25 NOTE — ED Notes (Signed)
cbg is 434

## 2014-02-25 NOTE — ED Notes (Signed)
Reported CBG level of 434 to [place], PA-C. No new orders at this time, will move patient to new room.

## 2014-02-25 NOTE — ED Notes (Signed)
  CBG 103  

## 2014-02-25 NOTE — ED Provider Notes (Signed)
CSN: [id number]     Arrival date & time 02/25/14  1905 History   First MD Initiated Contact with Patient 02/25/14 1943     Chief Complaint  Patient presents with  . Abscess     (Consider location/radiation/quality/duration/timing/severity/associated sxs/prior Treatment) HPI Comments: Patient is a 68 yo M PMHx significant for DM, CAD, CKD, Anemia, NICM presenting to the ED with the complaint of multiple small abscesses on his face. Patient states they have been present for several weeks and getting larger. He states only one, on the back of his head, has been draining. Denies any fevers, chills, headache, lip or tongue swelling, CP, SOB, abdominal pain, nausea, vomiting, diarrhea. Denies any missed doses of his medicine, chronically elevated glucose levels her previous visit on review of chart.  Patient is a 68 y.o. male presenting with abscess.  Abscess   Past Medical History  Diagnosis Date  . Venous thromboembolism     DVTs in 2004, 2005, 2007, 8/10, 8/11. IVC filter 2005. Off coumadin 04/2011 secondary to rectal bleeding  . 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 Chronic venous insufficiency   . DM2 (diabetes mellitus, type 2)   . GERD (gastroesophageal reflux disease)   . BPH (benign prostatic hypertrophy)   . Low back pain   . HTN (hypertension)   . HLD (hyperlipidemia)   . NICM (nonischemic cardiomyopathy) 1. 9/11  2. 2/12    1. Echo septal and apical akinesis, dilated LV, EF 45%, RV normal size and systolic function, EF 43% on Myoview  2. Admitted with decompensated CHF, echo EF 30-35% with diffuse hypokinesis but akinesis of mid to apical anteroseptal wall and apex, moderate LVH, mild MR, grade I diastolic dysfunction, SPEP/UP negative and HIV negative. Most recent EF 10-15% 04/2011  . Smoking   . CAD (coronary artery disease) 1. 2007  2. 10/11    1. Left heart cath with 40-50% stenosis in small RCA EF 50%  2. Lexiscan myoview EF 43%, global  hypokinesis, possible small area of apical ischemia; LHC no angiographic CAD, no LV-gram done due to CKD   2. No angiographic CAD by cath 02/2010  . CKD (chronic kidney disease) stage 2, GFR 60-89 ml/min as of 02/2011  . Headache(784.0)   . Diabetes mellitus   . Blood transfusion   . Anemia   . Syncope     1. in setting of presumed orthostatic Hypotn 09/2011  . Right fibular fracture     1. 09/2011 - resulting from fall/syncope  . Closed right ankle fracture 10/10/2011   Past Surgical History  Procedure Laterality Date  . Cardiac catheterization    . Appendectomy    . Cholecystectomy    . Colonoscopy  05/01/2011    Procedure: COLONOSCOPY;  Surgeon: Louis Meckel, MD;  Location: [facility] ENDOSCOPY;  Service: Endoscopy;  Laterality: N/A;  . Orif ankle fracture  10/10/2011    Procedure: OPEN REDUCTION INTERNAL FIXATION (ORIF) ANKLE FRACTURE;  Surgeon: Eulas Post, MD;  Location: [facility] OR;  Service: Orthopedics;  Laterality: Right;  Right  Ankle Fracture ORIF   Family History  Problem Relation Age of Onset  . Coronary artery disease Neg Hx     Premature   History  Substance Use Topics  . Smoking status: Current Some Day Smoker -- 1.00 packs/day for 40 years    Types: Cigarettes    Last Attempt to Quit: 06/30/2011  . Smokeless tobacco: Never Used  . Alcohol Use:  No    Review of Systems  Skin: Positive for color change.  All other systems reviewed and are negative.     Allergies  Review of patient's allergies indicates no known allergies.  Home Medications   Prior to Admission medications   Medication Sig Start Date End Date Taking? Authorizing Provider  allopurinol (ZYLOPRIM) 100 MG tablet Take 300 mg by mouth daily.   Yes Historical Provider, MD  atorvastatin (LIPITOR) 20 MG tablet Take 20 mg by mouth every morning.    Yes Historical Provider, MD  digoxin (LANOXIN) 0.125 MG tablet Take 0.125 mg by mouth daily.   Yes Historical Provider, MD  eplerenone (INSPRA) 25 MG tablet  Take 25 mg by mouth daily.   Yes Historical Provider, MD  famotidine (PEPCID) 20 MG tablet Take 20 mg by mouth daily.   Yes Historical Provider, MD  hydrALAZINE (APRESOLINE) 10 MG tablet Take 10 mg by mouth 3 (three) times daily.  12/09/12  Yes Amy D Clegg, NP  isosorbide mononitrate (IMDUR) 30 MG 24 hr tablet Take 30 mg by mouth daily. 12/09/12  Yes Amy D Clegg, NP  LANTUS SOLOSTAR 100 UNIT/ML Solostar Pen Inject 30 Units into the skin daily.  07/27/13  Yes Historical Provider, MD  lisinopril (PRINIVIL,ZESTRIL) 5 MG tablet Take 5 mg by mouth every evening.   Yes Historical Provider, MD  metolazone (ZAROXOLYN) 2.5 MG tablet Take 2.5 mg by mouth daily as needed (for increased fluid.).   Yes Historical Provider, MD  metoprolol succinate (TOPROL-XL) 25 MG 24 hr tablet Take 12.5 mg by mouth daily.    Yes Historical Provider, MD  nateglinide (STARLIX) 60 MG tablet Take 120 mg by mouth 3 (three) times daily.  07/29/13  Yes Historical Provider, MD  NEXIUM 40 MG capsule Take 1 capsule by mouth daily. 07/29/13  Yes Historical Provider, MD  tamsulosin (FLOMAX) 0.4 MG CAPS capsule Take 0.4 mg by mouth daily after supper.   Yes Historical Provider, MD  torsemide (DEMADEX) 20 MG tablet Take 20 mg by mouth every evening.   Yes Historical Provider, MD  clindamycin (CLEOCIN) 150 MG capsule Take 2 capsules (300 mg total) by mouth 3 (three) times daily. May dispense as 150mg  capsules 02/25/14   Imo Cumbie L Adrionna Delcid, PA-C   BP 132/85  Pulse 95  Temp(Src) 98.1 F (36.7 C) (Oral)  Resp 15  Ht 6' (1.829 m)  Wt 150 lb (68.04 kg)  BMI 20.34 kg/m2  SpO2 99% Physical Exam  Nursing note and vitals reviewed. Constitutional: He is oriented to person, place, and time. He appears well-developed and well-nourished. No distress.  HENT:  Head: Normocephalic and atraumatic.    Right Ear: External ear normal.  Left Ear: External ear normal.  Nose: Nose normal.  Mouth/Throat: Oropharynx is clear and moist.  Eyes:  Conjunctivae are normal.  Neck: Normal range of motion. Neck supple.  Cardiovascular: Normal rate.   Pulmonary/Chest: Effort normal.  Abdominal: Soft.  Musculoskeletal: Normal range of motion.  Neurological: He is alert and oriented to person, place, and time.  Skin: Skin is warm and dry. He is not diaphoretic.  Psychiatric: He has a normal mood and affect.    ED Course  Procedures (including critical care time) Medications  sodium chloride 0.9 % bolus 1,000 mL (0 mLs Intravenous Stopped 02/25/14 2341)  insulin aspart (novoLOG) injection 10 Units (10 Units Subcutaneous Given 02/25/14 2245)  oxyCODONE-acetaminophen (PERCOCET/ROXICET) 5-325 MG per tablet 1 tablet (1 tablet Oral Given 02/25/14 2245)    Labs  Review Labs Reviewed  BASIC METABOLIC PANEL - Abnormal; Notable for the following:    Sodium 127 (*)    Chloride 84 (*)    Glucose, Bld 509 (*)    Creatinine, Ser 1.72 (*)    GFR calc non Af Amer 40 (*)    GFR calc Af Amer 46 (*)    All other components within normal limits  CBC WITH DIFFERENTIAL - Abnormal; Notable for the following:    Neutrophils Relative % 80 (*)    Lymphocytes Relative 10 (*)    All other components within normal limits  CBG MONITORING, ED - Abnormal; Notable for the following:    Glucose-Capillary 434 (*)    All other components within normal limits  CBG MONITORING, ED - Abnormal; Notable for the following:    Glucose-Capillary 454 (*)    All other components within normal limits  KETONES, QUALITATIVE    Imaging Review No results found.   EKG Interpretation None      MDM   Final diagnoses:  Cellulitis of face  Hyperglycemia without ketosis    Filed Vitals:   02/25/14 2330  BP: 132/85  Pulse: 95  Temp:   Resp: 15   Afebrile, NAD, non-toxic appearing, AAOx4.   1) Cellulitis: Suspect uncomplicated cellulitis based on limited area of involvement, minimal pain, no systemic signs of illness (eg, fever, chills, dehydration, altered  mental status, tachypnea, tachycardia, hypotension), no risk factors for serious illness (eg, extremes of age, general debility, immunocompromised status). PE reveals redness, swelling, mildly tender, warm to touch. Skin intact, No bleeding. No bullae. Non purulent. Non circumferential.  Borders are not elevated or sharply demarcated.  Preformed ultrasound and did not detect any occult abscess. Directed pt to apply warm compresses and to return to ED for I&D if pain should increase or abscess should develop.   2) Hyperglycemia without ketosis: CBG elevated. No evidence of AG. No ketones. There is no evidence of DKA. Bolus and Novolog given with improvement of blood sugar level. Patient with chronically elevated glucose levels. Patient is requesting to be discharged he does not want to wait any long for better improvement of his glucose. As there is no evidence of DKA feel patient can go home with advised use of his home medications with PCP f/u to discuss his hyperglycemia.   Patient d/w with Dr. Hyacinth MeekerMiller, agrees with plan.    Jeannetta EllisJennifer L Pammie Chirino, PA-C 02/26/14 0041

## 2014-02-26 NOTE — ED Provider Notes (Signed)
Medical screening examination/treatment/procedure(s) were performed by non-physician practitioner and as supervising physician I was immediately available for consultation/collaboration.    Vida Roller, MD 02/26/14 1028

## 2014-03-01 ENCOUNTER — Encounter (HOSPITAL_COMMUNITY): Payer: Self-pay | Admitting: Emergency Medicine

## 2014-03-01 ENCOUNTER — Emergency Department (HOSPITAL_COMMUNITY)
Admission: EM | Admit: 2014-03-01 | Discharge: 2014-03-01 | Disposition: A | Discharge status: Home / Self Care | Payer: Medicare HMO | Attending: Emergency Medicine | Admitting: Emergency Medicine

## 2014-03-01 ENCOUNTER — Encounter: Payer: Self-pay | Admitting: Cardiology

## 2014-03-01 DIAGNOSIS — Z792 Long term (current) use of antibiotics: Secondary | ICD-10-CM | POA: Diagnosis not present

## 2014-03-01 DIAGNOSIS — R739 Hyperglycemia, unspecified: Secondary | ICD-10-CM

## 2014-03-01 DIAGNOSIS — Z8781 Personal history of (healed) traumatic fracture: Secondary | ICD-10-CM | POA: Insufficient documentation

## 2014-03-01 DIAGNOSIS — N182 Chronic kidney disease, stage 2 (mild): Secondary | ICD-10-CM | POA: Insufficient documentation

## 2014-03-01 DIAGNOSIS — L02811 Cutaneous abscess of head [any part, except face]: Secondary | ICD-10-CM | POA: Diagnosis not present

## 2014-03-01 DIAGNOSIS — I129 Hypertensive chronic kidney disease with stage 1 through stage 4 chronic kidney disease, or unspecified chronic kidney disease: Secondary | ICD-10-CM | POA: Diagnosis not present

## 2014-03-01 DIAGNOSIS — L0201 Cutaneous abscess of face: Secondary | ICD-10-CM | POA: Diagnosis not present

## 2014-03-01 DIAGNOSIS — N400 Enlarged prostate without lower urinary tract symptoms: Secondary | ICD-10-CM | POA: Insufficient documentation

## 2014-03-01 DIAGNOSIS — E119 Type 2 diabetes mellitus without complications: Secondary | ICD-10-CM | POA: Diagnosis not present

## 2014-03-01 DIAGNOSIS — Z794 Long term (current) use of insulin: Secondary | ICD-10-CM | POA: Insufficient documentation

## 2014-03-01 DIAGNOSIS — Z86718 Personal history of other venous thrombosis and embolism: Secondary | ICD-10-CM | POA: Insufficient documentation

## 2014-03-01 DIAGNOSIS — K219 Gastro-esophageal reflux disease without esophagitis: Secondary | ICD-10-CM | POA: Diagnosis not present

## 2014-03-01 DIAGNOSIS — I2510 Atherosclerotic heart disease of native coronary artery without angina pectoris: Secondary | ICD-10-CM | POA: Diagnosis not present

## 2014-03-01 DIAGNOSIS — Z862 Personal history of diseases of the blood and blood-forming organs and certain disorders involving the immune mechanism: Secondary | ICD-10-CM | POA: Insufficient documentation

## 2014-03-01 DIAGNOSIS — Z720 Tobacco use: Secondary | ICD-10-CM | POA: Insufficient documentation

## 2014-03-01 DIAGNOSIS — L0231 Cutaneous abscess of buttock: Secondary | ICD-10-CM | POA: Insufficient documentation

## 2014-03-01 DIAGNOSIS — E785 Hyperlipidemia, unspecified: Secondary | ICD-10-CM | POA: Diagnosis not present

## 2014-03-01 DIAGNOSIS — L0291 Cutaneous abscess, unspecified: Secondary | ICD-10-CM

## 2014-03-01 LAB — CBC WITH DIFFERENTIAL/PLATELET
Basophils Absolute: 0 10*3/uL (ref 0.0–0.1)
Basophils Relative: 0 % (ref 0–1)
EOS ABS: 0.2 10*3/uL (ref 0.0–0.7)
EOS PCT: 2 % (ref 0–5)
HCT: 46.4 % (ref 39.0–52.0)
Hemoglobin: 15.6 g/dL (ref 13.0–17.0)
LYMPHS ABS: 1 10*3/uL (ref 0.7–4.0)
Lymphocytes Relative: 9 % — ABNORMAL LOW (ref 12–46)
MCH: 30.4 pg (ref 26.0–34.0)
MCHC: 33.6 g/dL (ref 30.0–36.0)
MCV: 90.4 fL (ref 78.0–100.0)
MONOS PCT: 5 % (ref 3–12)
Monocytes Absolute: 0.6 10*3/uL (ref 0.1–1.0)
Neutro Abs: 9.7 10*3/uL — ABNORMAL HIGH (ref 1.7–7.7)
Neutrophils Relative %: 84 % — ABNORMAL HIGH (ref 43–77)
PLATELETS: 232 10*3/uL (ref 150–400)
RBC: 5.13 MIL/uL (ref 4.22–5.81)
RDW: 12.5 % (ref 11.5–15.5)
WBC: 11.6 10*3/uL — ABNORMAL HIGH (ref 4.0–10.5)

## 2014-03-01 LAB — I-STAT CHEM 8, ED
BUN: 22 mg/dL (ref 6–23)
Calcium, Ion: 1.21 mmol/L (ref 1.13–1.30)
Chloride: 92 mEq/L — ABNORMAL LOW (ref 96–112)
Creatinine, Ser: 1.7 mg/dL — ABNORMAL HIGH (ref 0.50–1.35)
GLUCOSE: 384 mg/dL — AB (ref 70–99)
HCT: 53 % — ABNORMAL HIGH (ref 39.0–52.0)
HEMOGLOBIN: 18 g/dL — AB (ref 13.0–17.0)
Potassium: 3.9 mEq/L (ref 3.7–5.3)
SODIUM: 133 meq/L — AB (ref 137–147)
TCO2: 30 mmol/L (ref 0–100)

## 2014-03-01 LAB — CBG MONITORING, ED: Glucose-Capillary: 451 mg/dL — ABNORMAL HIGH (ref 70–99)

## 2014-03-01 MED ORDER — LIDOCAINE-EPINEPHRINE 1 %-1:100000 IJ SOLN
10.0000 mL | Freq: Once | INTRAMUSCULAR | Status: DC
  Filled 2014-03-01: qty 1

## 2014-03-01 MED ORDER — SODIUM CHLORIDE 0.9 % IV BOLUS (SEPSIS): 1000.0000 mL | Freq: Once | INTRAVENOUS | Status: DC

## 2014-03-01 MED ORDER — INSULIN ASPART 100 UNIT/ML ~~LOC~~ SOLN
10.0000 [IU] | Freq: Once | SUBCUTANEOUS | Status: AC
  Administered 2014-03-01: 10 [IU] via SUBCUTANEOUS
  Filled 2014-03-01: qty 1

## 2014-03-01 MED ORDER — INSULIN ASPART 100 UNIT/ML ~~LOC~~ SOLN: 10.0000 [IU] | Freq: Once | SUBCUTANEOUS | Status: DC

## 2014-03-01 MED ORDER — HYDROCODONE-ACETAMINOPHEN 5-325 MG PO TABS: 1.0000 | ORAL_TABLET | Freq: Four times a day (QID) | ORAL | Status: DC | PRN

## 2014-03-01 NOTE — Discharge Instructions (Signed)

## 2014-03-01 NOTE — ED Provider Notes (Signed)
CSN: [id number]     Arrival date & time 03/01/14  1120 History   First MD Initiated Contact with Patient 03/01/14 1236     Chief Complaint  Patient presents with  . Abscess  . Cellulitis     (Consider location/radiation/quality/duration/timing/severity/associated sxs/prior Treatment) HPI Comments:  Patient presents to the emergency department with chief complaints of abscesses. He states that he was seen here for the same on Sunday. History with antibiotics, which she states have not been helping. He has a history of diabetes, but no other immunocompromising diseases. He states that he has not had abscesses prior to 2 weeks ago. He denies any fevers, chills, nausea, or vomiting. He states that the abscesses have been becoming more painful.  The history is provided by the patient. No language interpreter was used.    Past Medical History  Diagnosis Date  . Venous thromboembolism     DVTs in 2004, 2005, 2007, 8/10, 8/11. IVC filter 2005. Off coumadin 04/2011 secondary to rectal bleeding  . 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 Chronic venous insufficiency   . DM2 (diabetes mellitus, type 2)   . GERD (gastroesophageal reflux disease)   . BPH (benign prostatic hypertrophy)   . Low back pain   . HTN (hypertension)   . HLD (hyperlipidemia)   . NICM (nonischemic cardiomyopathy) 1. 9/11  2. 2/12    1. Echo septal and apical akinesis, dilated LV, EF 45%, RV normal size and systolic function, EF 43% on Myoview  2. Admitted with decompensated CHF, echo EF 30-35% with diffuse hypokinesis but akinesis of mid to apical anteroseptal wall and apex, moderate LVH, mild MR, grade I diastolic dysfunction, SPEP/UP negative and HIV negative. Most recent EF 10-15% 04/2011  . Smoking   . CAD (coronary artery disease) 1. 2007  2. 10/11    1. Left heart cath with 40-50% stenosis in small RCA EF 50%  2. Lexiscan myoview EF 43%, global hypokinesis, possible small area of apical  ischemia; LHC no angiographic CAD, no LV-gram done due to CKD   2. No angiographic CAD by cath 02/2010  . CKD (chronic kidney disease) stage 2, GFR 60-89 ml/min as of 02/2011  . Headache(784.0)   . Diabetes mellitus   . Blood transfusion   . Anemia   . Syncope     1. in setting of presumed orthostatic Hypotn 09/2011  . Right fibular fracture     1. 09/2011 - resulting from fall/syncope  . Closed right ankle fracture 10/10/2011   Past Surgical History  Procedure Laterality Date  . Cardiac catheterization    . Appendectomy    . Cholecystectomy    . Colonoscopy  05/01/2011    Procedure: COLONOSCOPY;  Surgeon: Louis Meckel, MD;  Location: [facility] ENDOSCOPY;  Service: Endoscopy;  Laterality: N/A;  . Orif ankle fracture  10/10/2011    Procedure: OPEN REDUCTION INTERNAL FIXATION (ORIF) ANKLE FRACTURE;  Surgeon: Eulas Post, MD;  Location: [facility] OR;  Service: Orthopedics;  Laterality: Right;  Right  Ankle Fracture ORIF   Family History  Problem Relation Age of Onset  . Coronary artery disease Neg Hx     Premature   History  Substance Use Topics  . Smoking status: Current Some Day Smoker -- 1.00 packs/day for 40 years    Types: Cigarettes    Last Attempt to Quit: 06/30/2011  . Smokeless tobacco: Never Used  . Alcohol Use: No    Review of  Systems  Constitutional: Negative for fever and chills.  Respiratory: Negative for shortness of breath.   Cardiovascular: Negative for chest pain.  Gastrointestinal: Negative for nausea, vomiting, diarrhea and constipation.  Genitourinary: Negative for dysuria.  All other systems reviewed and are negative.     Allergies  Review of patient's allergies indicates no known allergies.  Home Medications   Prior to Admission medications   Medication Sig Start Date End Date Taking? Authorizing Provider  allopurinol (ZYLOPRIM) 100 MG tablet Take 300 mg by mouth daily.    Historical Provider, MD  atorvastatin (LIPITOR) 20 MG tablet Take 20 mg by mouth  every morning.     Historical Provider, MD  clindamycin (CLEOCIN) 150 MG capsule Take 2 capsules (300 mg total) by mouth 3 (three) times daily. May dispense as 150mg  capsules 02/25/14   Jennifer L Piepenbrink, PA-C  digoxin (LANOXIN) 0.125 MG tablet Take 0.125 mg by mouth daily.    Historical Provider, MD  eplerenone (INSPRA) 25 MG tablet Take 25 mg by mouth daily.    Historical Provider, MD  famotidine (PEPCID) 20 MG tablet Take 20 mg by mouth daily.    Historical Provider, MD  hydrALAZINE (APRESOLINE) 10 MG tablet Take 10 mg by mouth 3 (three) times daily.  12/09/12   Amy D Filbert Schilderlegg, NP  isosorbide mononitrate (IMDUR) 30 MG 24 hr tablet Take 30 mg by mouth daily. 12/09/12   Amy D Clegg, NP  LANTUS SOLOSTAR 100 UNIT/ML Solostar Pen Inject 30 Units into the skin daily.  07/27/13   Historical Provider, MD  lisinopril (PRINIVIL,ZESTRIL) 5 MG tablet Take 5 mg by mouth every evening.    Historical Provider, MD  metolazone (ZAROXOLYN) 2.5 MG tablet Take 2.5 mg by mouth daily as needed (for increased fluid.).    Historical Provider, MD  metoprolol succinate (TOPROL-XL) 25 MG 24 hr tablet Take 12.5 mg by mouth daily.     Historical Provider, MD  nateglinide (STARLIX) 60 MG tablet Take 120 mg by mouth 3 (three) times daily.  07/29/13   Historical Provider, MD  NEXIUM 40 MG capsule Take 1 capsule by mouth daily. 07/29/13   Historical Provider, MD  tamsulosin (FLOMAX) 0.4 MG CAPS capsule Take 0.4 mg by mouth daily after supper.    Historical Provider, MD  torsemide (DEMADEX) 20 MG tablet Take 20 mg by mouth every evening.    Historical Provider, MD   BP 121/60  Pulse 100  Temp(Src) 97.6 F (36.4 C) (Oral)  Resp 16  SpO2 97% Physical Exam  Nursing note and vitals reviewed. Constitutional: He is oriented to person, place, and time. He appears well-developed and well-nourished.  HENT:  Head: Normocephalic and atraumatic.  Eyes: Conjunctivae and EOM are normal.  Neck: Normal range of motion.  Cardiovascular:  Normal rate.   Pulmonary/Chest: Effort normal.  Abdominal: He exhibits no distension.  Musculoskeletal: Normal range of motion.  Neurological: He is alert and oriented to person, place, and time.  Skin: Skin is dry.     Multiple small 1-2 cm abscesses as diagrammed  Psychiatric: He has a normal mood and affect. His behavior is normal. Judgment and thought content normal.    ED Course  Procedures (including critical care time) Results for orders placed during the hospital encounter of 03/01/14  CBG MONITORING, ED      Result Value Ref Range   Glucose-Capillary 451 (*) 70 - 99 mg/dL   No results found.    EKG Interpretation None      INCISION  AND DRAINAGE Performed by: Roxy Horseman Consent: Verbal consent obtained. Risks and benefits: risks, benefits and alternatives were discussed Type: abscess  Body area: right occipital scalp  Anesthesia: local infiltration  Incision was made with a scalpel.  Local anesthetic: lidocaine  1%  with epinephrine  Anesthetic total:  2 ml  Complexity: complex Blunt dissection to break up loculations  Drainage: purulent  Drainage amount:  moderate  Packing material:  Not packed  Patient tolerance: Patient tolerated the procedure well with no immediate complications.   INCISION AND DRAINAGE Performed by: Roxy Horseman Consent: Verbal consent obtained. Risks and benefits: risks, benefits and alternatives were discussed Type: abscess  Body area:  Right cheek  Anesthesia: local infiltration  Incision was made with a scalpel.  Local anesthetic: lidocaine  1%  with epinephrine  Anesthetic total:  1 ml  Complexity: complex Blunt dissection to break up loculations  Drainage: purulent  Drainage amount:  mild  Packing material:  Not packed  Patient tolerance: Patient tolerated the procedure well with no immediate complications.   INCISION AND DRAINAGE Performed by: Roxy Horseman Consent: Verbal consent  obtained. Risks and benefits: risks, benefits and alternatives were discussed Type: abscess  Body area:  Right buttock  Anesthesia: local infiltration  Incision was made with a scalpel.  Local anesthetic: lidocaine  1%  with epinephrine  Anesthetic total:  5 ml  Complexity: complex Blunt dissection to break up loculations  Drainage: purulent  Drainage amount:  moderate  Packing material:  Not packed  Patient tolerance: Patient tolerated the procedure well with no immediate complications.    MDM   Final diagnoses:  Abscess  Hyperglycemia     patient with multiple abscesses requiring incision and drainage. He was seen several days ago and started on antibiotics. Advised him to continue these. Also advised warm compresses. Return precautions given.   Glucose is elevated. Will give fluids, and check electrolytes.  2:52 PM Glucose trending down with subcutaneous insulin.  Patient refuses fluids.  Patient seen by and discussed with Dr. Hyacinth Meeker.  No evidence of systemic process.  DC to home.   Roxy Horseman, PA-C 03/01/14 1453

## 2014-03-01 NOTE — ED Notes (Signed)
Pt here with rash multiple abscesses to face. sts seen here Sunday for the same ans started on abx but getting worse.

## 2014-03-01 NOTE — ED Provider Notes (Signed)
Medical screening examination/treatment/procedure(s) were conducted as a shared visit with non-physician practitioner(s) and myself.  I personally evaluated the patient during the encounter  Please see my separate respective documentation pertaining to this patient encounter   Vida Roller, MD 03/01/14 2108

## 2014-03-01 NOTE — ED Notes (Signed)
Pt refused IV and blood draw. Pt states he can't handle the pain of needles. Oral fluids given and continuing to encourage fluids.

## 2014-03-01 NOTE — ED Provider Notes (Signed)
68 year old male, diabetic, presents with a complaint of multiple skin infections, on exam the patient has several scattered small abscesses including the posterior scalp on the right, right cheek, left cheek, right buttock. On exam several of these are fluctuant, tender, they're not consistent with subcutaneous lymphadenopathy. Several have arty opened up and drained, there is no fever, no shortness of breath, no tachycardia. The patient is currently taking antibiotics which he has had since Sunday, they are not improving the patient's symptoms, he will need incision and drainage of those lesions large enough to be treated operatively. Fluids for hyperglycemia, check for DKA. The patient does not appear to be in distress.  Medical screening examination/treatment/procedure(s) were conducted as a shared visit with non-physician practitioner(s) and myself.  I personally evaluated the patient during the encounter.  Clinical Impression:   Final diagnoses:  Abscess  Hyperglycemia      Vida Roller, MD 03/01/14 2108

## 2014-03-01 NOTE — ED Notes (Signed)
CBG-451

## 2014-03-22 ENCOUNTER — Other Ambulatory Visit (HOSPITAL_COMMUNITY): Payer: Self-pay | Admitting: Adult Health

## 2014-03-29 ENCOUNTER — Encounter: Payer: Self-pay | Admitting: *Deleted

## 2014-04-26 ENCOUNTER — Encounter (HOSPITAL_COMMUNITY): Payer: Self-pay

## 2014-04-26 ENCOUNTER — Inpatient Hospital Stay (HOSPITAL_COMMUNITY)
Admission: EM | Admit: 2014-04-26 | Discharge: 2014-04-29 | DRG: 638 | Disposition: A | Discharge status: Home / Self Care | Payer: Medicare HMO | Attending: Internal Medicine | Admitting: Internal Medicine

## 2014-04-26 DIAGNOSIS — I5022 Chronic systolic (congestive) heart failure: Secondary | ICD-10-CM | POA: Diagnosis present

## 2014-04-26 DIAGNOSIS — I872 Venous insufficiency (chronic) (peripheral): Secondary | ICD-10-CM | POA: Diagnosis present

## 2014-04-26 DIAGNOSIS — E860 Dehydration: Secondary | ICD-10-CM | POA: Diagnosis present

## 2014-04-26 DIAGNOSIS — E785 Hyperlipidemia, unspecified: Secondary | ICD-10-CM | POA: Diagnosis present

## 2014-04-26 DIAGNOSIS — M109 Gout, unspecified: Secondary | ICD-10-CM | POA: Diagnosis present

## 2014-04-26 DIAGNOSIS — Y9289 Other specified places as the place of occurrence of the external cause: Secondary | ICD-10-CM

## 2014-04-26 DIAGNOSIS — N183 Chronic kidney disease, stage 3 (moderate): Secondary | ICD-10-CM | POA: Diagnosis present

## 2014-04-26 DIAGNOSIS — M1039 Gout due to renal impairment, multiple sites: Secondary | ICD-10-CM

## 2014-04-26 DIAGNOSIS — L97921 Non-pressure chronic ulcer of unspecified part of left lower leg limited to breakdown of skin: Secondary | ICD-10-CM | POA: Diagnosis present

## 2014-04-26 DIAGNOSIS — I429 Cardiomyopathy, unspecified: Secondary | ICD-10-CM | POA: Diagnosis present

## 2014-04-26 DIAGNOSIS — E1165 Type 2 diabetes mellitus with hyperglycemia: Secondary | ICD-10-CM | POA: Diagnosis present

## 2014-04-26 DIAGNOSIS — Z95810 Presence of automatic (implantable) cardiac defibrillator: Secondary | ICD-10-CM | POA: Diagnosis present

## 2014-04-26 DIAGNOSIS — I10 Essential (primary) hypertension: Secondary | ICD-10-CM | POA: Diagnosis present

## 2014-04-26 DIAGNOSIS — Z86718 Personal history of other venous thrombosis and embolism: Secondary | ICD-10-CM

## 2014-04-26 DIAGNOSIS — E1100 Type 2 diabetes mellitus with hyperosmolarity without nonketotic hyperglycemic-hyperosmolar coma (NKHHC): Principal | ICD-10-CM | POA: Diagnosis present

## 2014-04-26 DIAGNOSIS — I129 Hypertensive chronic kidney disease with stage 1 through stage 4 chronic kidney disease, or unspecified chronic kidney disease: Secondary | ICD-10-CM | POA: Diagnosis present

## 2014-04-26 DIAGNOSIS — T502X5A Adverse effect of carbonic-anhydrase inhibitors, benzothiadiazides and other diuretics, initial encounter: Secondary | ICD-10-CM | POA: Diagnosis present

## 2014-04-26 DIAGNOSIS — R739 Hyperglycemia, unspecified: Secondary | ICD-10-CM

## 2014-04-26 DIAGNOSIS — E876 Hypokalemia: Secondary | ICD-10-CM | POA: Diagnosis present

## 2014-04-26 DIAGNOSIS — L98421 Non-pressure chronic ulcer of back limited to breakdown of skin: Secondary | ICD-10-CM | POA: Diagnosis present

## 2014-04-26 DIAGNOSIS — Z79899 Other long term (current) drug therapy: Secondary | ICD-10-CM

## 2014-04-26 DIAGNOSIS — N400 Enlarged prostate without lower urinary tract symptoms: Secondary | ICD-10-CM | POA: Diagnosis present

## 2014-04-26 DIAGNOSIS — Z794 Long term (current) use of insulin: Secondary | ICD-10-CM

## 2014-04-26 DIAGNOSIS — F17210 Nicotine dependence, cigarettes, uncomplicated: Secondary | ICD-10-CM | POA: Diagnosis present

## 2014-04-26 DIAGNOSIS — I2510 Atherosclerotic heart disease of native coronary artery without angina pectoris: Secondary | ICD-10-CM | POA: Diagnosis present

## 2014-04-26 DIAGNOSIS — N179 Acute kidney failure, unspecified: Secondary | ICD-10-CM | POA: Diagnosis present

## 2014-04-26 DIAGNOSIS — Z9049 Acquired absence of other specified parts of digestive tract: Secondary | ICD-10-CM | POA: Diagnosis present

## 2014-04-26 DIAGNOSIS — K31819 Angiodysplasia of stomach and duodenum without bleeding: Secondary | ICD-10-CM

## 2014-04-26 DIAGNOSIS — I5043 Acute on chronic combined systolic (congestive) and diastolic (congestive) heart failure: Secondary | ICD-10-CM

## 2014-04-26 DIAGNOSIS — K219 Gastro-esophageal reflux disease without esophagitis: Secondary | ICD-10-CM | POA: Diagnosis present

## 2014-04-26 LAB — URINALYSIS, ROUTINE W REFLEX MICROSCOPIC
Bilirubin Urine: NEGATIVE
Glucose, UA: 250 mg/dL — AB
Hgb urine dipstick: NEGATIVE
Ketones, ur: NEGATIVE mg/dL
LEUKOCYTES UA: NEGATIVE
Nitrite: NEGATIVE
PH: 5 (ref 5.0–8.0)
PROTEIN: NEGATIVE mg/dL
Specific Gravity, Urine: 1.013 (ref 1.005–1.030)
Urobilinogen, UA: 0.2 mg/dL (ref 0.0–1.0)

## 2014-04-26 LAB — COMPREHENSIVE METABOLIC PANEL
ALBUMIN: 4.8 g/dL (ref 3.5–5.2)
ALT: 24 U/L (ref 0–53)
AST: 34 U/L (ref 0–37)
Alkaline Phosphatase: 101 U/L (ref 39–117)
Anion gap: 20 — ABNORMAL HIGH (ref 5–15)
BUN: 80 mg/dL — ABNORMAL HIGH (ref 6–23)
CALCIUM: 10.1 mg/dL (ref 8.4–10.5)
CO2: 28 mEq/L (ref 19–32)
CREATININE: 2.89 mg/dL — AB (ref 0.50–1.35)
Chloride: 74 mEq/L — ABNORMAL LOW (ref 96–112)
GFR calc Af Amer: 25 mL/min — ABNORMAL LOW (ref >90)
GFR calc non Af Amer: 21 mL/min — ABNORMAL LOW (ref >90)
Glucose, Bld: 587 mg/dL (ref 70–99)
Potassium: 2.7 mEq/L — CL (ref 3.7–5.3)
Sodium: 122 mEq/L — ABNORMAL LOW (ref 137–147)
Total Bilirubin: 1 mg/dL (ref 0.3–1.2)
Total Protein: 8.7 g/dL — ABNORMAL HIGH (ref 6.0–8.3)

## 2014-04-26 LAB — CBC
HCT: 47 % (ref 39.0–52.0)
Hemoglobin: 16.5 g/dL (ref 13.0–17.0)
MCH: 29.5 pg (ref 26.0–34.0)
MCHC: 35.1 g/dL (ref 30.0–36.0)
MCV: 83.9 fL (ref 78.0–100.0)
Platelets: 164 10*3/uL (ref 150–400)
RBC: 5.6 MIL/uL (ref 4.22–5.81)
RDW: 12.2 % (ref 11.5–15.5)
WBC: 5.3 10*3/uL (ref 4.0–10.5)

## 2014-04-26 LAB — CBG MONITORING, ED: Glucose-Capillary: >600 mg/dL (ref 70–99)

## 2014-04-26 MED ORDER — HYDROCODONE-ACETAMINOPHEN 5-325 MG PO TABS
1.0000 | ORAL_TABLET | Freq: Once | ORAL | Status: AC
  Administered 2014-04-26: 1 via ORAL
  Filled 2014-04-26: qty 1

## 2014-04-26 MED ORDER — SODIUM CHLORIDE 0.9 % IV BOLUS (SEPSIS)
500.0000 mL | Freq: Once | INTRAVENOUS | Status: AC
  Administered 2014-04-26: 500 mL via INTRAVENOUS

## 2014-04-26 MED ORDER — SODIUM CHLORIDE 0.9 % IV BOLUS (SEPSIS): 2000.0000 mL | Freq: Once | INTRAVENOUS | Status: DC

## 2014-04-26 MED ORDER — SODIUM CHLORIDE 0.9 % IV BOLUS (SEPSIS): 1000.0000 mL | Freq: Once | INTRAVENOUS | Status: DC

## 2014-04-26 NOTE — ED Notes (Addendum)
cbg >600; md notified

## 2014-04-26 NOTE — ED Notes (Signed)
Per EMS - pt c/o bilateral leg pain. Pt ran out of gout medication yesterday and was supposed to see the doctor tomorrow; however, he states that pain has increased. Pt has hx of hypertension, diabetes, and gout. CBG 600+,  bp 124/78, hr 64, O2 98%, RR 16.

## 2014-04-26 NOTE — ED Provider Notes (Signed)
CSN: [id number]     Arrival date & time 04/26/14  2232 History  This chart was scribed for Timothy Crumble, MD by Karle Plumber, ED Scribe. This patient was seen in room B16C/B16C and the patient's care was started at 11:15 PM.   Chief Complaint  Patient presents with  . Hyperglycemia   Patient is a 68 y.o. male presenting with hyperglycemia. The history is provided by the patient. No language interpreter was used.  Hyperglycemia Associated symptoms: no chest pain, no diaphoresis, no fever and no shortness of breath    HPI Comments:  DMITRIY [name] is a 68 y.o. male brought in by EMS, with PMH of DM and gout, who presents to the Emergency Department complaining of ongoing hyperglycemia, frequent urination and excessive thirst. He reports sore lesions to bilateral lower extremities and gout pain in bilateral great toes. He reports his CBG levels have been high since receiving his pace maker in January (12 months ago). He reports associated tachycardia that has also been baseline since his defibrillator was placed. He reports applying a medicated cream to his legs he received from his PCP, Dr. Julio Sicks with no significant relief of the pain. Pt states he normally takes Allopurinol for his gout pain but ran out of it yesterday. He denies modifying factors. He states Denies CP, SOB, fever, chills or diaphoresis. PMH of DVT, HTN, HLD, CAD, CKD, and anemia.  Past Medical History  Diagnosis Date  . Venous thromboembolism     DVTs in 2004, 2005, 2007, 8/10, 8/11. IVC filter 2005. Off coumadin 04/2011 secondary to rectal bleeding  . 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 Chronic venous insufficiency   . DM2 (diabetes mellitus, type 2)   . GERD (gastroesophageal reflux disease)   . BPH (benign prostatic hypertrophy)   . Low back pain   . HTN (hypertension)   . HLD (hyperlipidemia)   . NICM (nonischemic cardiomyopathy) 1. 9/11  2. 2/12    1. Echo septal and apical akinesis,  dilated LV, EF 45%, RV normal size and systolic function, EF 43% on Myoview  2. Admitted with decompensated CHF, echo EF 30-35% with diffuse hypokinesis but akinesis of mid to apical anteroseptal wall and apex, moderate LVH, mild MR, grade I diastolic dysfunction, SPEP/UP negative and HIV negative. Most recent EF 10-15% 04/2011  . Smoking   . CAD (coronary artery disease) 1. 2007  2. 10/11    1. Left heart cath with 40-50% stenosis in small RCA EF 50%  2. Lexiscan myoview EF 43%, global hypokinesis, possible small area of apical ischemia; LHC no angiographic CAD, no LV-gram done due to CKD   2. No angiographic CAD by cath 02/2010  . CKD (chronic kidney disease) stage 2, GFR 60-89 ml/min as of 02/2011  . Headache(784.0)   . Diabetes mellitus   . Blood transfusion   . Anemia   . Syncope     1. in setting of presumed orthostatic Hypotn 09/2011  . Right fibular fracture     1. 09/2011 - resulting from fall/syncope  . Closed right ankle fracture 10/10/2011   Past Surgical History  Procedure Laterality Date  . Cardiac catheterization    . Appendectomy    . Cholecystectomy    . Colonoscopy  05/01/2011    Procedure: COLONOSCOPY;  Surgeon: Louis Meckel, MD;  Location: [facility] ENDOSCOPY;  Service: Endoscopy;  Laterality: N/A;  . Orif ankle fracture  10/10/2011    Procedure: OPEN  REDUCTION INTERNAL FIXATION (ORIF) ANKLE FRACTURE;  Surgeon: Eulas Post, MD;  Location: [facility] OR;  Service: Orthopedics;  Laterality: Right;  Right  Ankle Fracture ORIF   Family History  Problem Relation Age of Onset  . Coronary artery disease Neg Hx     Premature   History  Substance Use Topics  . Smoking status: Current Some Day Smoker -- 1.00 packs/day for 40 years    Types: Cigarettes    Last Attempt to Quit: 06/30/2011  . Smokeless tobacco: Never Used  . Alcohol Use: No    Review of Systems  Constitutional: Negative for fever, chills and diaphoresis.  Respiratory: Negative for shortness of breath.    Cardiovascular: Negative for chest pain.  Musculoskeletal: Positive for arthralgias.  Skin: Positive for wound.  All other systems reviewed and are negative.  Allergies  Review of patient's allergies indicates no known allergies.  Home Medications   Prior to Admission medications   Medication Sig Start Date End Date Taking? Authorizing Provider  allopurinol (ZYLOPRIM) 100 MG tablet Take 300 mg by mouth daily.   Yes Historical Provider, MD  atorvastatin (LIPITOR) 20 MG tablet Take 20 mg by mouth every morning.    Yes Historical Provider, MD  digoxin (LANOXIN) 0.125 MG tablet Take 0.125 mg by mouth daily.   Yes Historical Provider, MD  eplerenone (INSPRA) 25 MG tablet Take 25 mg by mouth daily.   Yes Historical Provider, MD  esomeprazole (NEXIUM) 40 MG capsule Take 40 mg by mouth daily at 12 noon.   Yes Historical Provider, MD  famotidine (PEPCID) 20 MG tablet Take 20 mg by mouth daily.   Yes Historical Provider, MD  hydrALAZINE (APRESOLINE) 10 MG tablet Take 10 mg by mouth 3 (three) times daily.  12/09/12  Yes Amy D Clegg, NP  isosorbide mononitrate (IMDUR) 30 MG 24 hr tablet Take 30 mg by mouth daily. 12/09/12  Yes Amy D Clegg, NP  LANTUS SOLOSTAR 100 UNIT/ML Solostar Pen Inject 30 Units into the skin at bedtime.  07/27/13  Yes Historical Provider, MD  lisinopril (PRINIVIL,ZESTRIL) 5 MG tablet Take 5 mg by mouth every evening.   Yes Historical Provider, MD  metolazone (ZAROXOLYN) 2.5 MG tablet Take 2.5 mg by mouth daily as needed (for increased fluid.).   Yes Historical Provider, MD  metoprolol succinate (TOPROL-XL) 25 MG 24 hr tablet Take 12.5 mg by mouth daily.    Yes Historical Provider, MD  nateglinide (STARLIX) 60 MG tablet Take 120 mg by mouth 3 (three) times daily.  07/29/13  Yes Historical Provider, MD  tamsulosin (FLOMAX) 0.4 MG CAPS capsule Take 0.4 mg by mouth daily after supper.   Yes Historical Provider, MD  torsemide (DEMADEX) 20 MG tablet Take 20 mg by mouth every evening.    Yes Historical Provider, MD  allopurinol (ZYLOPRIM) 100 MG tablet TAKE 1 TABLET BY MOUTH DAILY 03/23/14   Aundria Rud, NP  clindamycin (CLEOCIN) 150 MG capsule Take 2 capsules (300 mg total) by mouth 3 (three) times daily. May dispense as 150mg  capsules Patient not taking: Reported on 04/26/2014 02/25/14   Lise Auer Piepenbrink, PA-C  HYDROcodone-acetaminophen (NORCO/VICODIN) 5-325 MG per tablet Take 1 tablet by mouth every 6 (six) hours as needed. Patient not taking: Reported on 04/26/2014 03/01/14   Roxy Horseman, PA-C   Triage Vitals: BP 119/79 mmHg  Pulse 115  Temp(Src) 97.8 F (36.6 C) (Oral)  Resp 19  SpO2 99% Physical Exam  Constitutional: He is oriented to person, place, and time.  Vital signs are normal. He appears well-developed and well-nourished.  Non-toxic appearance. He does not appear ill. No distress.  HENT:  Head: Normocephalic and atraumatic.  Nose: Nose normal.  Mouth/Throat: Oropharynx is clear and moist. No oropharyngeal exudate.  Eyes: Conjunctivae and EOM are normal. Pupils are equal, round, and reactive to light. No scleral icterus.  Neck: Normal range of motion. Neck supple. No tracheal deviation, no edema, no erythema and normal range of motion present. No thyroid mass and no thyromegaly present.  Cardiovascular: Normal rate, regular rhythm, S1 normal, S2 normal, normal heart sounds, intact distal pulses and normal pulses.  Exam reveals no gallop and no friction rub.   No murmur heard. Pulses:      Radial pulses are 2+ on the right side, and 2+ on the left side.       Dorsalis pedis pulses are 2+ on the right side, and 2+ on the left side.  Pulmonary/Chest: Effort normal and breath sounds normal. No respiratory distress. He has no wheezes. He has no rhonchi. He has no rales.  AICD/pacemaker noted to left chest.  Abdominal: Soft. Normal appearance and bowel sounds are normal. He exhibits no distension, no ascites and no mass. There is no hepatosplenomegaly.  There is no tenderness. There is no rebound, no guarding and no CVA tenderness.  Musculoskeletal: Normal range of motion. He exhibits no edema or tenderness.  Lymphadenopathy:    He has no cervical adenopathy.  Neurological: He is alert and oriented to person, place, and time. He has normal strength. No cranial nerve deficit or sensory deficit. GCS eye subscore is 4. GCS verbal subscore is 5. GCS motor subscore is 6.  Skin: Skin is warm, dry and intact. No petechiae and no rash noted. He is not diaphoretic. No erythema. No pallor.  1 cm lesion to right shin. No erythema or purulent drainage. No warmth  Psychiatric: He has a normal mood and affect. His behavior is normal. Judgment normal.  Nursing note and vitals reviewed.   ED Course  Procedures (including critical care time) DIAGNOSTIC STUDIES: Oxygen Saturation is 99% on RA, normal by my interpretation.   COORDINATION OF CARE: 11:22 PM- Will order lab work and IV fluids. Pt verbalizes understanding and agrees to plan.  Medications  magnesium sulfate IVPB 2 g 50 mL (2 g Intravenous New Bag/Given 04/27/14 0057)  potassium chloride 10 mEq in 100 mL IVPB (10 mEq Intravenous New Bag/Given 04/27/14 0057)  sodium chloride 0.9 % bolus 500 mL (0 mLs Intravenous Stopped 04/27/14 0103)  HYDROcodone-acetaminophen (NORCO/VICODIN) 5-325 MG per tablet 1 tablet (1 tablet Oral Given 04/26/14 2350)  potassium chloride SA (K-DUR,KLOR-CON) CR tablet 60 mEq (60 mEq Oral Given 04/27/14 0050)    Labs Review Labs Reviewed  COMPREHENSIVE METABOLIC PANEL - Abnormal; Notable for the following:    Sodium 122 (*)    Potassium 2.7 (*)    Chloride 74 (*)    Glucose, Bld 587 (*)    BUN 80 (*)    Creatinine, Ser 2.89 (*)    Total Protein 8.7 (*)    GFR calc non Af Amer 21 (*)    GFR calc Af Amer 25 (*)    Anion gap 20 (*)    All other components within normal limits  URINALYSIS, ROUTINE W REFLEX MICROSCOPIC - Abnormal; Notable for the following:     Glucose, UA 250 (*)    All other components within normal limits  MAGNESIUM - Abnormal; Notable for the following:  Magnesium 1.2 (*)    All other components within normal limits  DIGOXIN LEVEL - Abnormal; Notable for the following:    Digoxin Level <0.3 (*)    All other components within normal limits  BASIC METABOLIC PANEL - Abnormal; Notable for the following:    Sodium 123 (*)    Potassium 3.6 (*)    Chloride 77 (*)    Glucose, Bld 487 (*)    BUN 75 (*)    Creatinine, Ser 2.65 (*)    GFR calc non Af Amer 24 (*)    GFR calc Af Amer 27 (*)    Anion gap 17 (*)    All other components within normal limits  GLUCOSE, CAPILLARY - Abnormal; Notable for the following:    Glucose-Capillary 501 (*)    All other components within normal limits  GLUCOSE, CAPILLARY - Abnormal; Notable for the following:    Glucose-Capillary 410 (*)    All other components within normal limits  CBG MONITORING, ED - Abnormal; Notable for the following:    Glucose-Capillary >600 (*)    All other components within normal limits  MRSA PCR SCREENING  CBC  URIC ACID  TROPONIN I  HEMOGLOBIN A1C  BASIC METABOLIC PANEL  BASIC METABOLIC PANEL  BASIC METABOLIC PANEL  CBG MONITORING, ED    Imaging Review No results found.   EKG Interpretation None      MDM   Final diagnoses:  Hyperglycemia  Hypokalemia  Acute gout due to renal impairment involving multiple sites    Patient presents to the emergency department out of concern for pain in his bilateral feet. He states is consistent with his gout. Takes allopurinol but ran out yesterday. Patient has limited choices due to his renal failure and heart failure. He was given a Norco for pain relief and I will refill his allopurinol prescription. He was also given a 500 mL bolus for his hyperglycemia.  BASIC metabolic panel reveals hypokalemia along with an AKI.  This makes the patient's treatment of hyperglycemia and gout more difficult. He'll require  inpatient admission to replace his magnesium, potassium, and give insulin to decrease his blood sugar. Gentle fluids was given due to his history of heart failure. I gave him 2 g of magnesium and 60 mEq is of potassium in the emergency department. [facility]. Uric acid was drawn at their request.  I personally performed the services described in this documentation, which was scribed in my presence. The recorded information has been reviewed and is accurate.    Timothy Crumble, MD 04/27/14 [phone]

## 2014-04-27 ENCOUNTER — Encounter (HOSPITAL_COMMUNITY): Payer: Self-pay | Admitting: Internal Medicine

## 2014-04-27 DIAGNOSIS — E785 Hyperlipidemia, unspecified: Secondary | ICD-10-CM | POA: Diagnosis present

## 2014-04-27 DIAGNOSIS — E876 Hypokalemia: Secondary | ICD-10-CM | POA: Diagnosis present

## 2014-04-27 DIAGNOSIS — M109 Gout, unspecified: Secondary | ICD-10-CM | POA: Diagnosis present

## 2014-04-27 DIAGNOSIS — L98421 Non-pressure chronic ulcer of back limited to breakdown of skin: Secondary | ICD-10-CM | POA: Diagnosis present

## 2014-04-27 DIAGNOSIS — L97921 Non-pressure chronic ulcer of unspecified part of left lower leg limited to breakdown of skin: Secondary | ICD-10-CM | POA: Diagnosis present

## 2014-04-27 DIAGNOSIS — Z9049 Acquired absence of other specified parts of digestive tract: Secondary | ICD-10-CM | POA: Diagnosis present

## 2014-04-27 DIAGNOSIS — N179 Acute kidney failure, unspecified: Secondary | ICD-10-CM | POA: Diagnosis present

## 2014-04-27 DIAGNOSIS — Z95810 Presence of automatic (implantable) cardiac defibrillator: Secondary | ICD-10-CM | POA: Diagnosis not present

## 2014-04-27 DIAGNOSIS — I872 Venous insufficiency (chronic) (peripheral): Secondary | ICD-10-CM | POA: Diagnosis present

## 2014-04-27 DIAGNOSIS — Z79899 Other long term (current) drug therapy: Secondary | ICD-10-CM | POA: Diagnosis not present

## 2014-04-27 DIAGNOSIS — I2510 Atherosclerotic heart disease of native coronary artery without angina pectoris: Secondary | ICD-10-CM | POA: Diagnosis present

## 2014-04-27 DIAGNOSIS — Y9289 Other specified places as the place of occurrence of the external cause: Secondary | ICD-10-CM | POA: Diagnosis not present

## 2014-04-27 DIAGNOSIS — Z86718 Personal history of other venous thrombosis and embolism: Secondary | ICD-10-CM | POA: Diagnosis not present

## 2014-04-27 DIAGNOSIS — E1100 Type 2 diabetes mellitus with hyperosmolarity without nonketotic hyperglycemic-hyperosmolar coma (NKHHC): Secondary | ICD-10-CM | POA: Diagnosis present

## 2014-04-27 DIAGNOSIS — F17210 Nicotine dependence, cigarettes, uncomplicated: Secondary | ICD-10-CM | POA: Diagnosis present

## 2014-04-27 DIAGNOSIS — I129 Hypertensive chronic kidney disease with stage 1 through stage 4 chronic kidney disease, or unspecified chronic kidney disease: Secondary | ICD-10-CM | POA: Diagnosis present

## 2014-04-27 DIAGNOSIS — K219 Gastro-esophageal reflux disease without esophagitis: Secondary | ICD-10-CM | POA: Diagnosis present

## 2014-04-27 DIAGNOSIS — T502X5A Adverse effect of carbonic-anhydrase inhibitors, benzothiadiazides and other diuretics, initial encounter: Secondary | ICD-10-CM | POA: Diagnosis present

## 2014-04-27 DIAGNOSIS — I82409 Acute embolism and thrombosis of unspecified deep veins of unspecified lower extremity: Secondary | ICD-10-CM | POA: Diagnosis not present

## 2014-04-27 DIAGNOSIS — N183 Chronic kidney disease, stage 3 (moderate): Secondary | ICD-10-CM | POA: Diagnosis present

## 2014-04-27 DIAGNOSIS — N400 Enlarged prostate without lower urinary tract symptoms: Secondary | ICD-10-CM | POA: Diagnosis present

## 2014-04-27 DIAGNOSIS — I429 Cardiomyopathy, unspecified: Secondary | ICD-10-CM | POA: Diagnosis present

## 2014-04-27 DIAGNOSIS — Z794 Long term (current) use of insulin: Secondary | ICD-10-CM | POA: Diagnosis not present

## 2014-04-27 DIAGNOSIS — E1165 Type 2 diabetes mellitus with hyperglycemia: Secondary | ICD-10-CM | POA: Diagnosis present

## 2014-04-27 DIAGNOSIS — E860 Dehydration: Secondary | ICD-10-CM | POA: Diagnosis present

## 2014-04-27 DIAGNOSIS — I5022 Chronic systolic (congestive) heart failure: Secondary | ICD-10-CM | POA: Diagnosis present

## 2014-04-27 DIAGNOSIS — I10 Essential (primary) hypertension: Secondary | ICD-10-CM | POA: Diagnosis not present

## 2014-04-27 LAB — BASIC METABOLIC PANEL
Anion gap: 17 — ABNORMAL HIGH (ref 5–15)
Anion gap: 21 — ABNORMAL HIGH (ref 5–15)
Anion gap: 19 — ABNORMAL HIGH (ref 5–15)
BUN: 75 mg/dL — AB (ref 6–23)
BUN: 72 mg/dL — ABNORMAL HIGH (ref 6–23)
BUN: 69 mg/dL — ABNORMAL HIGH (ref 6–23)
CALCIUM: 9.3 mg/dL (ref 8.4–10.5)
CALCIUM: 9.6 mg/dL (ref 8.4–10.5)
CO2: 29 mEq/L (ref 19–32)
CO2: 24 meq/L (ref 19–32)
CO2: 27 meq/L (ref 19–32)
Calcium: 9.4 mg/dL (ref 8.4–10.5)
Chloride: 77 mEq/L — ABNORMAL LOW (ref 96–112)
Chloride: 86 mEq/L — ABNORMAL LOW (ref 96–112)
Chloride: 88 mEq/L — ABNORMAL LOW (ref 96–112)
Creatinine, Ser: 2.65 mg/dL — ABNORMAL HIGH (ref 0.50–1.35)
Creatinine, Ser: 2.08 mg/dL — ABNORMAL HIGH (ref 0.50–1.35)
Creatinine, Ser: 1.97 mg/dL — ABNORMAL HIGH (ref 0.50–1.35)
GFR calc Af Amer: 27 mL/min — ABNORMAL LOW (ref >90)
GFR calc Af Amer: 37 mL/min — ABNORMAL LOW (ref >90)
GFR calc Af Amer: 39 mL/min — ABNORMAL LOW (ref >90)
GFR calc non Af Amer: 24 mL/min — ABNORMAL LOW (ref >90)
GFR calc non Af Amer: 32 mL/min — ABNORMAL LOW (ref >90)
GFR calc non Af Amer: 34 mL/min — ABNORMAL LOW (ref >90)
GLUCOSE: 487 mg/dL — AB (ref 70–99)
GLUCOSE: 241 mg/dL — AB (ref 70–99)
GLUCOSE: 67 mg/dL — AB (ref 70–99)
POTASSIUM: 3.6 meq/L — AB (ref 3.7–5.3)
Potassium: 3.4 mEq/L — ABNORMAL LOW (ref 3.7–5.3)
Potassium: 3.5 mEq/L — ABNORMAL LOW (ref 3.7–5.3)
SODIUM: 131 meq/L — AB (ref 137–147)
SODIUM: 134 meq/L — AB (ref 137–147)
Sodium: 123 mEq/L — ABNORMAL LOW (ref 137–147)

## 2014-04-27 LAB — DIGOXIN LEVEL

## 2014-04-27 LAB — HEMOGLOBIN A1C
HEMOGLOBIN A1C: 15.2 % — AB (ref <5.7)
MEAN PLASMA GLUCOSE: 390 mg/dL — AB (ref <117)

## 2014-04-27 LAB — GLUCOSE, CAPILLARY
GLUCOSE-CAPILLARY: 410 mg/dL — AB (ref 70–99)
GLUCOSE-CAPILLARY: 387 mg/dL — AB (ref 70–99)
GLUCOSE-CAPILLARY: 176 mg/dL — AB (ref 70–99)
GLUCOSE-CAPILLARY: 71 mg/dL (ref 70–99)
Glucose-Capillary: 501 mg/dL — ABNORMAL HIGH (ref 70–99)
Glucose-Capillary: 365 mg/dL — ABNORMAL HIGH (ref 70–99)
Glucose-Capillary: 337 mg/dL — ABNORMAL HIGH (ref 70–99)
Glucose-Capillary: 182 mg/dL — ABNORMAL HIGH (ref 70–99)
Glucose-Capillary: 74 mg/dL (ref 70–99)
Glucose-Capillary: 220 mg/dL — ABNORMAL HIGH (ref 70–99)
Glucose-Capillary: 220 mg/dL — ABNORMAL HIGH (ref 70–99)
Glucose-Capillary: 327 mg/dL — ABNORMAL HIGH (ref 70–99)

## 2014-04-27 LAB — MRSA PCR SCREENING: MRSA by PCR: NEGATIVE

## 2014-04-27 LAB — TROPONIN I: Troponin I: <0.3 ng/mL (ref <0.30)

## 2014-04-27 LAB — URIC ACID: Uric Acid, Serum: 5.8 mg/dL (ref 4.0–7.8)

## 2014-04-27 LAB — MAGNESIUM: MAGNESIUM: 1.2 mg/dL — AB (ref 1.5–2.5)

## 2014-04-27 MED ORDER — POTASSIUM CHLORIDE 10 MEQ/100ML IV SOLN
10.0000 meq | INTRAVENOUS | Status: AC
  Administered 2014-04-27 (×4): 10 meq via INTRAVENOUS
  Filled 2014-04-27 (×4): qty 100

## 2014-04-27 MED ORDER — INSULIN GLARGINE 100 UNIT/ML SOLOSTAR PEN: 30.0000 [IU] | PEN_INJECTOR | Freq: Every day | SUBCUTANEOUS | Status: DC

## 2014-04-27 MED ORDER — ATORVASTATIN CALCIUM 20 MG PO TABS
20.0000 mg | ORAL_TABLET | Freq: Every day | ORAL | Status: DC
  Administered 2014-04-27 – 2014-04-29 (×3): 20 mg via ORAL
  Filled 2014-04-27 (×3): qty 1

## 2014-04-27 MED ORDER — ONDANSETRON HCL 4 MG/2ML IJ SOLN: 4.0000 mg | Freq: Four times a day (QID) | INTRAMUSCULAR | Status: DC | PRN

## 2014-04-27 MED ORDER — POTASSIUM CHLORIDE CRYS ER 20 MEQ PO TBCR
60.0000 meq | EXTENDED_RELEASE_TABLET | Freq: Once | ORAL | Status: AC
Start: 2014-04-27 — End: 2014-04-27
  Administered 2014-04-27: 60 meq via ORAL
  Filled 2014-04-27: qty 3

## 2014-04-27 MED ORDER — ALBUTEROL SULFATE (2.5 MG/3ML) 0.083% IN NEBU: 2.5000 mg | INHALATION_SOLUTION | RESPIRATORY_TRACT | Status: DC | PRN

## 2014-04-27 MED ORDER — SODIUM CHLORIDE 0.9 % IV BOLUS (SEPSIS)
500.0000 mL | Freq: Once | INTRAVENOUS | Status: AC
  Administered 2014-04-27: 500 mL via INTRAVENOUS

## 2014-04-27 MED ORDER — INSULIN ASPART 100 UNIT/ML ~~LOC~~ SOLN
10.0000 [IU] | Freq: Once | SUBCUTANEOUS | Status: AC
  Administered 2014-04-27: 10 [IU] via SUBCUTANEOUS

## 2014-04-27 MED ORDER — MAGNESIUM SULFATE 2 GM/50ML IV SOLN
2.0000 g | Freq: Once | INTRAVENOUS | Status: AC
  Administered 2014-04-27: 2 g via INTRAVENOUS
  Filled 2014-04-27: qty 50

## 2014-04-27 MED ORDER — TAMSULOSIN HCL 0.4 MG PO CAPS
0.4000 mg | ORAL_CAPSULE | Freq: Every day | ORAL | Status: DC
  Administered 2014-04-27 – 2014-04-28 (×2): 0.4 mg via ORAL
  Filled 2014-04-27 (×3): qty 1

## 2014-04-27 MED ORDER — ONDANSETRON HCL 4 MG PO TABS: 4.0000 mg | ORAL_TABLET | Freq: Four times a day (QID) | ORAL | Status: DC | PRN

## 2014-04-27 MED ORDER — MORPHINE SULFATE 2 MG/ML IJ SOLN: 1.0000 mg | INTRAMUSCULAR | Status: DC | PRN

## 2014-04-27 MED ORDER — INSULIN ASPART 100 UNIT/ML ~~LOC~~ SOLN
0.0000 [IU] | Freq: Three times a day (TID) | SUBCUTANEOUS | Status: DC
  Administered 2014-04-27: 5 [IU] via SUBCUTANEOUS
  Administered 2014-04-28: 2 [IU] via SUBCUTANEOUS
  Administered 2014-04-28: 5 [IU] via SUBCUTANEOUS
  Administered 2014-04-28: 8 [IU] via SUBCUTANEOUS

## 2014-04-27 MED ORDER — GUAIFENESIN-DM 100-10 MG/5ML PO SYRP
5.0000 mL | ORAL_SOLUTION | ORAL | Status: DC | PRN
Start: 2014-04-27 — End: 2014-04-29
  Filled 2014-04-27: qty 5

## 2014-04-27 MED ORDER — COLLAGENASE 250 UNIT/GM EX OINT
TOPICAL_OINTMENT | Freq: Every day | CUTANEOUS | Status: DC
  Administered 2014-04-27: 1 via TOPICAL
  Administered 2014-04-28 – 2014-04-29 (×2): via TOPICAL
  Filled 2014-04-27: qty 30

## 2014-04-27 MED ORDER — SODIUM CHLORIDE 0.9 % IV SOLN
INTRAVENOUS | Status: DC
  Administered 2014-04-27: 07:00:00 via INTRAVENOUS

## 2014-04-27 MED ORDER — HYDRALAZINE HCL 10 MG PO TABS
10.0000 mg | ORAL_TABLET | Freq: Three times a day (TID) | ORAL | Status: DC
  Administered 2014-04-27 – 2014-04-29 (×7): 10 mg via ORAL
  Filled 2014-04-27 (×9): qty 1

## 2014-04-27 MED ORDER — ALLOPURINOL 300 MG PO TABS
300.0000 mg | ORAL_TABLET | Freq: Every day | ORAL | Status: DC
  Administered 2014-04-27 – 2014-04-29 (×3): 300 mg via ORAL
  Filled 2014-04-27 (×3): qty 1

## 2014-04-27 MED ORDER — OXYCODONE HCL 5 MG PO TABS
5.0000 mg | ORAL_TABLET | ORAL | Status: DC | PRN
  Administered 2014-04-27: 5 mg via ORAL
  Filled 2014-04-27: qty 1

## 2014-04-27 MED ORDER — DIGOXIN 250 MCG PO TABS
0.2500 mg | ORAL_TABLET | Freq: Three times a day (TID) | ORAL | Status: AC
  Administered 2014-04-27 (×2): 0.25 mg via ORAL
  Filled 2014-04-27 (×2): qty 1

## 2014-04-27 MED ORDER — SODIUM CHLORIDE 0.9 % IV SOLN
INTRAVENOUS | Status: DC
  Administered 2014-04-27: 2.4 [IU]/h via INTRAVENOUS
  Administered 2014-04-27: 4.4 [IU]/h via INTRAVENOUS
  Administered 2014-04-27: 3.1 [IU]/h via INTRAVENOUS
  Administered 2014-04-27: 5.5 [IU]/h via INTRAVENOUS
  Administered 2014-04-27: 1 [IU]/h via INTRAVENOUS
  Filled 2014-04-27: qty 2.5

## 2014-04-27 MED ORDER — DIGOXIN 125 MCG PO TABS
0.1250 mg | ORAL_TABLET | Freq: Every day | ORAL | Status: DC
  Administered 2014-04-27 – 2014-04-29 (×3): 0.125 mg via ORAL
  Filled 2014-04-27 (×3): qty 1

## 2014-04-27 MED ORDER — HEPARIN SODIUM (PORCINE) 5000 UNIT/ML IJ SOLN
5000.0000 [IU] | Freq: Three times a day (TID) | INTRAMUSCULAR | Status: DC
  Administered 2014-04-27 – 2014-04-29 (×6): 5000 [IU] via SUBCUTANEOUS
  Filled 2014-04-27 (×10): qty 1

## 2014-04-27 MED ORDER — INSULIN REGULAR BOLUS VIA INFUSION
0.0000 [IU] | Freq: Three times a day (TID) | INTRAVENOUS | Status: DC
  Administered 2014-04-27: 1 [IU] via INTRAVENOUS
  Filled 2014-04-27: qty 10

## 2014-04-27 MED ORDER — ACETAMINOPHEN 325 MG PO TABS: 650.0000 mg | ORAL_TABLET | Freq: Four times a day (QID) | ORAL | Status: DC | PRN

## 2014-04-27 MED ORDER — DEXTROSE 50 % IV SOLN: 25.0000 mL | INTRAVENOUS | Status: DC | PRN

## 2014-04-27 MED ORDER — SODIUM CHLORIDE 0.9 % IV SOLN
INTRAVENOUS | Status: DC
  Administered 2014-04-27: 08:00:00 via INTRAVENOUS

## 2014-04-27 MED ORDER — ACETAMINOPHEN 650 MG RE SUPP: 650.0000 mg | Freq: Four times a day (QID) | RECTAL | Status: DC | PRN

## 2014-04-27 MED ORDER — METOPROLOL SUCCINATE 12.5 MG HALF TABLET
12.5000 mg | ORAL_TABLET | Freq: Every day | ORAL | Status: DC
  Administered 2014-04-27 – 2014-04-29 (×3): 12.5 mg via ORAL
  Filled 2014-04-27 (×3): qty 1

## 2014-04-27 MED ORDER — SENNA 8.6 MG PO TABS
1.0000 | ORAL_TABLET | Freq: Two times a day (BID) | ORAL | Status: DC
  Administered 2014-04-27 – 2014-04-29 (×5): 8.6 mg via ORAL
  Filled 2014-04-27 (×6): qty 1

## 2014-04-27 MED ORDER — POTASSIUM CHLORIDE CRYS ER 20 MEQ PO TBCR
60.0000 meq | EXTENDED_RELEASE_TABLET | Freq: Once | ORAL | Status: AC
  Administered 2014-04-27: 60 meq via ORAL
  Filled 2014-04-27: qty 3

## 2014-04-27 MED ORDER — POTASSIUM CHLORIDE CRYS ER 20 MEQ PO TBCR: 40.0000 meq | EXTENDED_RELEASE_TABLET | Freq: Once | ORAL | Status: DC

## 2014-04-27 MED ORDER — PANTOPRAZOLE SODIUM 40 MG PO TBEC
40.0000 mg | DELAYED_RELEASE_TABLET | Freq: Every day | ORAL | Status: DC
Start: 2014-04-27 — End: 2014-04-29
  Administered 2014-04-27 – 2014-04-29 (×3): 40 mg via ORAL
  Filled 2014-04-27 (×2): qty 1

## 2014-04-27 MED ORDER — DEXTROSE-NACL 5-0.45 % IV SOLN: INTRAVENOUS | Status: DC

## 2014-04-27 MED ORDER — ISOSORBIDE MONONITRATE ER 30 MG PO TB24
30.0000 mg | ORAL_TABLET | Freq: Every day | ORAL | Status: DC
  Administered 2014-04-27 – 2014-04-29 (×3): 30 mg via ORAL
  Filled 2014-04-27 (×3): qty 1

## 2014-04-27 MED ORDER — INSULIN GLARGINE 100 UNIT/ML ~~LOC~~ SOLN
30.0000 [IU] | Freq: Every day | SUBCUTANEOUS | Status: DC
  Administered 2014-04-27: 30 [IU] via SUBCUTANEOUS
  Filled 2014-04-27: qty 0.3

## 2014-04-27 NOTE — Progress Notes (Signed)
OTTIE LINDENBAUM [id number] Code Status: Full   Admission Data: 04/27/2014 4:57 AM Attending Provider: Ghimire  [id number],KMMNOT, MD Consults/ Treatment Team:    IDAN NAZARI is a 68 y.o. male patient admitted from ED awake, alert - oriented  X 3 - no acute distress noted.  VSS - Blood pressure 135/91, pulse 97, temperature 97.5 F (36.4 C), temperature source Oral, resp. rate 18, height 6' (1.829 m), weight 56.745 kg (125 lb 1.6 oz), SpO2 98 %.    IV in place, occlusive dsg intact without redness.  Orientation to room, and floor completed with information packet given to patient/family.  Patient declined safety video at this time.  Admission INP armband ID verified with patient/family, and in place.   SR up x 2, fall assessment complete, with patient and family able to verbalize understanding of risk associated with falls, and verbalized understanding to call nsg before up out of bed.  Call light within reach, patient able to voice, and demonstrate understanding.  Skin, clean-dry- Pt has scab on back, and sore on left leg.        Will cont to eval and treat per MD orders.  Sharilyn Sites, RN 04/27/2014 4:57 AM

## 2014-04-27 NOTE — Progress Notes (Signed)
Inpatient Diabetes Program Recommendations  AACE/ADA: New Consensus Statement on Inpatient Glycemic Control (2013)  Target Ranges:  Prepandial:   less than 140 mg/dL      Peak postprandial:   less than 180 mg/dL (1-2 hours)      Critically ill patients:  140 - 180 mg/dL  Results for [name], [name] (MRN [id number]) as of 04/27/2014 10:20  Ref. Range 04/27/2014 06:31 04/27/2014 07:42 04/27/2014 08:25 04/27/2014 08:46 04/27/2014 09:52  Glucose-Capillary Latest Range: 70-99 mg/dL 299 (H) 371 (H)  696 (H) 182 (H)    Reason for assessment- Glucostablizer, elevated CBG  Diabetes history: Type 2 Outpatient Diabetes medications: Lantus 30 units qhs, Starlix 60 mg qday Current orders for Inpatient glycemic control: Glucostabalizer  Spoke to [employer] correction not given at recent meal where patient ate 100% of meal.  MD is going to transition patient off the [employer] and is currently writing order- reminded staff to give long acting Levemir or Lantus 2 hours before discontinuing the drip. Reminded to request insulin for meal coverage. Will follow.  [name], RN, BA, MHA, CDE Diabetes Coordinator Inpatient Diabetes Program  [phone] (Team Pager) [phone] [name]) 04/27/2014 10:27 AM

## 2014-04-27 NOTE — Progress Notes (Signed)
PT IV infiltrated, awaiting IV team. MD notified. MD gave telephone orders to give 10 units of Novolog .

## 2014-04-27 NOTE — H&P (Signed)
PATIENT DETAILS Name: Timothy BisMelvin D Dansby Age: 68 y.o. Sex: male Date of Birth: 25-Oct-1945 Admit Date: 04/26/2014 ZOX:[id number]:OSEI-BONSU,GEORGE, MD  CHIEF COMPLAINT:  Bilateral lower extremity small ulcers, uncontrolled diabetes  HPI: [name] is a 68 y.o. male with a Past Medical History of chronic systolic heart failure status post AICD placement, history of recurrent venous thromboembolism not on anticoagulation due to history of GI bleeding-has IVC filter, chronic kidney disease stage III, who presents today with the above noted complaint. Per patient, for the past few weeks he has had 1 small ulceration in both of his lower extremities that are painful. He presented to the emergency room for this, however upon further evaluation was found to have CBG more than 600, potassium of 2.7 and worsening of his renal failure with a creatinine of 2.89. Patient does endorse significant polyuria and polydipsia for the past 1 month. He claims that his sugars have been persistently in the 300-400 range for the past 1 month. He claims that he takes Lantus 20 units at night, and takes Starlix for his diabetes. He claims he has been compliant with these medications and also been compliant with diet. He denies any chest pain or shortness of breath, claims that he can easily walk a mile  on a daily basis. He denies any orthopnea or PND or leg swelling. Patient denies any fever, headache, chest pain, shortness of breath, nausea, vomiting, diarrhea   ALLERGIES:  No Known Allergies  PAST MEDICAL HISTORY: Past Medical History  Diagnosis Date  . Venous thromboembolism     DVTs in 2004, 2005, 2007, 8/10, 8/11. IVC filter 2005. Off coumadin 04/2011 secondary to rectal bleeding  . 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 Chronic venous insufficiency   . DM2 (diabetes mellitus, type 2)   . GERD (gastroesophageal reflux disease)   . BPH (benign prostatic hypertrophy)   . Low back pain    . HTN (hypertension)   . HLD (hyperlipidemia)   . NICM (nonischemic cardiomyopathy) 1. 9/11  2. 2/12    1. Echo septal and apical akinesis, dilated LV, EF 45%, RV normal size and systolic function, EF 43% on Myoview  2. Admitted with decompensated CHF, echo EF 30-35% with diffuse hypokinesis but akinesis of mid to apical anteroseptal wall and apex, moderate LVH, mild MR, grade I diastolic dysfunction, SPEP/UP negative and HIV negative. Most recent EF 10-15% 04/2011  . Smoking   . CAD (coronary artery disease) 1. 2007  2. 10/11    1. Left heart cath with 40-50% stenosis in small RCA EF 50%  2. Lexiscan myoview EF 43%, global hypokinesis, possible small area of apical ischemia; LHC no angiographic CAD, no LV-gram done due to CKD   2. No angiographic CAD by cath 02/2010  . CKD (chronic kidney disease) stage 2, GFR 60-89 ml/min as of 02/2011  . Headache(784.0)   . Diabetes mellitus   . Blood transfusion   . Anemia   . Syncope     1. in setting of presumed orthostatic Hypotn 09/2011  . Right fibular fracture     1. 09/2011 - resulting from fall/syncope  . Closed right ankle fracture 10/10/2011    PAST SURGICAL HISTORY: Past Surgical History  Procedure Laterality Date  . Cardiac catheterization    . Appendectomy    . Cholecystectomy    . Colonoscopy  05/01/2011    Procedure: COLONOSCOPY;  Surgeon: Louis Meckelobert D Kaplan,  MD;  Location: [facility] END[facility]COPY;  Service: Endoscopy;  Laterality: N/A;  . Orif ankle fracture  10/10/2011    Procedure: OPEN REDUCTION INTERNAL FIXATION (ORIF) ANKLE FRACTURE;  Surgeon: Eulas Post, MD;  Location: [facility] OR;  Service: Orthopedics;  Laterality: Right;  Right  Ankle Fracture ORIF    MEDICATIONS AT HOME: Prior to Admission medications   Medication Sig Start Date End Date Taking? Authorizing Provider  allopurinol (ZYLOPRIM) 100 MG tablet Take 300 mg by mouth daily.   Yes Historical Provider, MD  atorvastatin (LIPITOR) 20 MG tablet Take 20 mg by mouth every morning.     Yes Historical Provider, MD  digoxin (LANOXIN) 0.125 MG tablet Take 0.125 mg by mouth daily.   Yes Historical Provider, MD  eplerenone (INSPRA) 25 MG tablet Take 25 mg by mouth daily.   Yes Historical Provider, MD  esomeprazole (NEXIUM) 40 MG capsule Take 40 mg by mouth daily at 12 noon.   Yes Historical Provider, MD  famotidine (PEPCID) 20 MG tablet Take 20 mg by mouth daily.   Yes Historical Provider, MD  hydrALAZINE (APRESOLINE) 10 MG tablet Take 10 mg by mouth 3 (three) times daily.  12/09/12  Yes Amy D Clegg, NP  isosorbide mononitrate (IMDUR) 30 MG 24 hr tablet Take 30 mg by mouth daily. 12/09/12  Yes Amy D Clegg, NP  LANTUS SOLOSTAR 100 UNIT/ML Solostar Pen Inject 30 Units into the skin at bedtime.  07/27/13  Yes Historical Provider, MD  lisinopril (PRINIVIL,ZESTRIL) 5 MG tablet Take 5 mg by mouth every evening.   Yes Historical Provider, MD  metolazone (ZAROXOLYN) 2.5 MG tablet Take 2.5 mg by mouth daily as needed (for increased fluid.).   Yes Historical Provider, MD  metoprolol succinate (TOPROL-XL) 25 MG 24 hr tablet Take 12.5 mg by mouth daily.    Yes Historical Provider, MD  nateglinide (STARLIX) 60 MG tablet Take 120 mg by mouth 3 (three) times daily.  07/29/13  Yes Historical Provider, MD  tamsulosin (FLOMAX) 0.4 MG CAPS capsule Take 0.4 mg by mouth daily after supper.   Yes Historical Provider, MD  torsemide (DEMADEX) 20 MG tablet Take 20 mg by mouth every evening.   Yes Historical Provider, MD  allopurinol (ZYLOPRIM) 100 MG tablet TAKE 1 TABLET BY MOUTH DAILY 03/23/14   Aundria Rud, NP  clindamycin (CLEOCIN) 150 MG capsule Take 2 capsules (300 mg total) by mouth 3 (three) times daily. May dispense as 150mg  capsules Patient not taking: Reported on 04/26/2014 02/25/14   Lise Auer Piepenbrink, PA-C  HYDROcodone-acetaminophen (NORCO/VICODIN) 5-325 MG per tablet Take 1 tablet by mouth every 6 (six) hours as needed. Patient not taking: Reported on 04/26/2014 03/01/14   Roxy Horseman, PA-C      FAMILY HISTORY: Family History  Problem Relation Age of Onset  . Coronary artery disease Neg Hx     Premature    SOCIAL HISTORY:  reports that he has been smoking Cigarettes.  He has a 40 pack-year smoking history. He has never used smokeless tobacco. He reports that he does not drink alcohol or use illicit drugs.  REVIEW OF SYSTEMS:  Constitutional:   No  weight loss, night sweats,  Fevers, chills, fatigue.  HEENT:    No headaches, Difficulty swallowing,Tooth/dental problems,Sore throat,   Cardio-vascular: No chest pain,  Orthopnea, PND, swelling in lower extremities.  GI:  No heartburn, indigestion, abdominal pain, nausea, vomiting, diarrhea.  Resp: No shortness of breath with exertion or at rest.  No excess mucus, no productive  cough, No non-productive cough,  No coughing up of blood.No change in color of mucus.No wheezing.No chest wall deformity  Skin:  no rash or lesions.  GU:  no dysuria, change in color of urine, no urgency or frequency.  No flank pain.  Musculoskeletal: No joint pain or swelling.  No decreased range of motion.  No back pain.  Psych: No change in mood or affect. No depression or anxiety.  No memory loss.   PHYSICAL EXAM: Blood pressure 119/79, pulse 115, temperature 97.8 F (36.6 C), temperature source Oral, resp. rate 19, SpO2 99 %.  General appearance :Awake, alert, not in any distress. Speech Clear. Not toxic Looking HEENT: Atraumatic and Normocephalic, pupils equally reactive to light and accomodation Neck: supple, no JVD. No cervical lymphadenopathy.  Chest:Good air entry bilaterally, no added sounds  CVS: S1 S2 regular, no murmurs.  Abdomen: Bowel sounds present, Non tender and not distended with no gaurding, rigidity or rebound. Extremities: B/L Lower Ext shows no edema, both legs are warm to touch Neurology: Awake alert, and oriented X 3, CN II-XII intact, Non focal Skin:No Rash Wounds: Approximately 1 cm 1 cm small ulceration  noted in bilateral shin area. A larger 2 cm 4 cm ulceration noted in the left mid back area. No active discharge, no surrounding cellulitis seen.  LABS ON ADMISSION:   Recent Labs  04/26/14 2306  NA 122*  K 2.7*  CL 74*  CO2 28  GLUCOSE 587*  BUN 80*  CREATININE 2.89*  CALCIUM 10.1  MG 1.2*    Recent Labs  04/26/14 2306  AST 34  ALT 24  ALKPHOS 101  BILITOT 1.0  PROT 8.7*  ALBUMIN 4.8   No results for input(s): LIPASE, AMYLASE in the last 72 hours.  Recent Labs  04/26/14 2306  WBC 5.3  HGB 16.5  HCT 47.0  MCV 83.9  PLT 164   No results for input(s): CKTOTAL, CKMB, CKMBINDEX, TROPONINI in the last 72 hours. No results for input(s): DDIMER in the last 72 hours. Invalid input(s): POCBNP   RADIOLOGIC STUDIES ON ADMISSION: No results found.   EKG: Independently reviewed. Sinus rhythm, LVH pattern  ASSESSMENT AND PLAN: Present on Admission:  . Uncontrolled type 2 DM with hyperosmolar nonketotic hyperglycemia: Will place on insulin infusion per glucose stabilizer protocol. Have asked RN to start insulin infusion, after giving 2 runs of IV KCl and oral 60 mEq of potassium. Given history of significant chronic systolic heart failure, will need need to be very cautious with IV fluids. He does not appear to be overtly dehydrated, but suspect does have some room for fluids. Check A1c, likely will need escalation in his insulin regimen prior to discharge.  . Hypokalemia: Likely secondary to diuretic regimen. will receive a total of 4 runs of IV KCl and 60 mEq of oral potassium. Since he will be on insulin infusion, we will check BMET every 4 hours to make sure potassium remains within normal range.    . SYSTOLIC HEART FAILURE, CHRONIC (Echo 05/09/13-EF 20-25%): Compensated, in fact perhaps slightly on the dry side. Hold diuretics and ACE inhibitor for now given worsening renal function. Follow weight and volume status, and resume medications accordingly. Digoxin levels  ordered-please follow  . Acute on chronic renal failure stage III: Acute renal failure likely secondary to uncontrolled diabetes. Will hold diuretics and ace inhibitor briefly for now, suspect it after correction of hyperglycemia renal failure should significantly improve. Will repeat electrolytes in a.m., reassess volume status and start  usual medications if able.  . ICD (implantable cardioverter-defibrillator) in place: Monitor in telemetry   . Essential hypertension, benign: Hold diuretics and ACE inhibitor, resume usual home medications. Follow BP, adjust medications accordingly.   Marland Kitchen BPH (benign prostatic hyperplasia): Continue Flomax   . DVT (deep venous thrombosis)-status post IVC filter: None on any anticoagulation, given history of GI bleeding from gastric AVMs.  . Bilateral lower extremity small ulceration/ulcer in back: Wound care evaluation.  Marland Kitchen Hx of gout: Complains of pain in bilateral foot area-no evidence of any acute gouty arthritis-will check uric acid level. Apparently ran out of allopurinol, will resume.  Further plan will depend as patient's clinical course evolves and further radiologic and laboratory data become available. Patient will be monitored closely.  Above noted plan was discussed with patient,he was in agreement.   DVT Prophylaxis:  Heparin  Code Status: Full Code  Disposition Plan: Home when stable  Total time spent for admission equals 45 minutes.  [facility] Pager [phone]  If 7PM-7AM, please contact night-coverage [email or web address] Password [email or web address] 04/27/2014, 12:53 AM

## 2014-04-27 NOTE — Progress Notes (Signed)
[facility] PROGRESS NOTE  [name] AVW:[id number] DOB: 1945-08-13 DOA: 04/26/2014 PCP: Jackie PlumSEI-BONSU,GEORGE, MD  Assessment/Plan:  Uncontrolled type II DM with hyperosmolar nonketotic hyperglycemia -Presented with CBGs greater than 600- reports compliance with diabetes medication.  No ketones on urine. -Patient placed on glucose stabilizer and is subsequently transition to SQ Lantus 30 units daily, and SSI after CBGs at target -hgb A1c-15.2- will likely need adjustment of home diabetes meds -continue CBG monitoring q 4 hrs. Repeat BMET in am  Hypokalemia -K 3.5, improved from 2.7 on admission -Placed on 4 runs of IV potassium and Kdur 40 meq X2 -magnesium 1.2-replete with 2 g of IV magnesium. -monitor BMET and magnesium in the a.m.  Chronic systolic heart failure -Appears dehydrated on exam -2D echo 04/2013- EF 20-25%, grade II diastolic dysfunction -Continue digoxin 0.125mg  daily; continue Imdur 30 MG daily    Acute on chronic renal failure stage III -Creat 1.97; baseline around 1.7 -we'll hold ACEI -Continue gentile IV hydration in setting of CHF at 3550ml/hr -repeat BMET in a.m.  Multiple ulcers -Bilateral lower extremities and back with small noninfected ulcers -Appreciate wound care rec's- Santyl ointment, outpatient dermatology follow-up for further evaluation of ulcers. -Continue pain management when necessary  History of CAD -Denies current chest pain.  Troponin 1 negative -status post ICD -continue telemonitoring  Hypertension -Blood pressure controlled.  Patient with episodes of elevated BP on admission -Hold ACE inhibitor.  Continue  Metoprolol 12.5 MG daily, hydralazine 10mg  TID, and Imdur 30mg  daily  History of gout -denies any current foot pain -Uric Acid normal -Continue Allopurinol 300 MG  Hyperlipidemia -Continue Lipitor 20 MG daily  History of DVT -Status post IVC filter, not anticoagulated due to GIB from AVMs  BPH Continue Flomax  0.4 MG daily  Hx of Gastric AVM -stable with no current bleed -due to hx of GIB, not anticoag for DVTs  GERD -Continue Protonix 40mg  daily  DVT Prophylaxis SQ heparin Code Status: full Family Communication: No family at bedside Disposition Plan: Inpatient   Consultants:  Wound care/ diabetic coordinator  Procedures:  None  Antibiotics:  None  HPI/Subjective: [name] is a 68 yo male with PMH of diabetes mellitus, chronic venous insufficiency, coronary artery disease, chronic systolic heart failure, CK D stage II, nonischemic cardiomyopathy, hypertension, hyperlipidemia, GERD, BPH,history of DVT ( not on anticoag due to hx of GIB ,AVM ( duodenal, with GI bleed), low back pain, and headache.  Patient presented to the ED with complaints of bilateral lower extremity ulcers and back ulcer.  He admits to increased polyuria polydipsia for the past 1 month and states his sugars are always elevated in the 400 range.  He states that the ulcer has been present for years, but recently with increased pain.  He states his is compliant with his diabetes medications and his diet.  He states he takes Lantus 20 units at night and Starlix. He denies any recent illness, chest pain, shortness of breath, orthopnea, or leg edema. In ED, patient is found with elevated CBGs more than 600, potassium of 2.7, and worsening renal failure with creatinine of 2.89.   States he has a headache, and complains of pain from his ulcer sites.  Objective: Filed Vitals:   04/27/14 1404  BP: 99/64  Pulse: 100  Temp: 98 F (36.7 C)  Resp: 16    Intake/Output Summary (Last 24 hours) at 04/27/14 1422 Last data filed at 04/27/14 1404  Gross per 24 hour  Intake    530  ml  Output    980 ml  Net   -450 ml   Filed Weights   04/27/14 0240  Weight: 56.745 kg (125 lb 1.6 oz)    Exam:  Gen: Alert and oriented chronically sick looking AA male in NAD. HEENT: Normocephalic, atraumatic.  Pupils symmertrical.   Moist mucosa.   Chest: clear to auscultate bilaterally, no ronchi or rales.  Back with small non infected ulcer. Cardiac: Regular rate and rhythm, S1-S2, no rubs murmurs or gallops  Abdomen: soft, non tender, non distended, +bowel sounds. No guarding or rigidity  Extremities: Symmetrical in appearance without cyanosis or edema.  With chronic venous skin changes. BLE- with small non infected ulcer   Neurological: Alert awake oriented to time place and person.  Psychiatric: Appears normal.   Data Reviewed: Basic Metabolic Panel:  Recent Labs Lab 04/26/14 2306 04/27/14 0315 04/27/14 0825 04/27/14 1140  NA 122* 123* 131* 134*  K 2.7* 3.6* 3.4* 3.5*  CL 74* 77* 86* 88*  CO2 28 29 24 27   GLUCOSE 587* 487* 241* 67*  BUN 80* 75* 72* 69*  CREATININE 2.89* 2.65* 2.08* 1.97*  CALCIUM 10.1 9.3 9.6 9.4  MG 1.2*  --   --   --    Liver Function Tests:  Recent Labs Lab 04/26/14 2306  AST 34  ALT 24  ALKPHOS 101  BILITOT 1.0  PROT 8.7*  ALBUMIN 4.8   No results for input(s): LIPASE, AMYLASE in the last 168 hours. No results for input(s): AMMONIA in the last 168 hours. CBC:  Recent Labs Lab 04/26/14 2306  WBC 5.3  HGB 16.5  HCT 47.0  MCV 83.9  PLT 164   Cardiac Enzymes:  Recent Labs Lab 04/27/14 0315  TROPONINI <0.30   BNP (last 3 results)  Recent Labs  08/10/13 1650  PROBNP 1181.0*   CBG:  Recent Labs Lab 04/27/14 0742 04/27/14 0846 04/27/14 0952 04/27/14 1125 04/27/14 1142  GLUCAP 337* 176* 182* 71 74    Recent Results (from the past 240 hour(s))  MRSA PCR Screening     Status: None   Collection Time: 04/27/14  2:58 AM  Result Value Ref Range Status   MRSA by PCR NEGATIVE NEGATIVE Final    Comment:        The GeneXpert MRSA Assay (FDA approved for NASAL specimens only), is one component of a comprehensive MRSA colonization surveillance program. It is not intended to diagnose MRSA infection nor to guide or monitor treatment for MRSA  infections.      Studies: No results found.  Scheduled Meds: . allopurinol  300 mg Oral Daily  . atorvastatin  20 mg Oral Daily  . collagenase   Topical Daily  . digoxin  0.125 mg Oral Daily  . digoxin  0.25 mg Oral Q8H  . heparin  5,000 Units Subcutaneous 3 times per day  . hydrALAZINE  10 mg Oral TID  . insulin aspart  0-15 Units Subcutaneous TID WC  . insulin glargine  30 Units Subcutaneous QHS  . isosorbide mononitrate  30 mg Oral Daily  . metoprolol succinate  12.5 mg Oral Daily  . pantoprazole  40 mg Oral Daily  . potassium chloride SA  40 mEq Oral Once  . senna  1 tablet Oral BID  . tamsulosin  0.4 mg Oral QPC supper   Continuous Infusions: . sodium chloride 50 mL/hr at 04/27/14 4536    Active Problems:   Essential hypertension, benign   SYSTOLIC HEART FAILURE, CHRONIC  DVT (deep venous thrombosis)   Gastric AVM   BPH (benign prostatic hyperplasia)   ICD (implantable cardioverter-defibrillator) in place   Uncontrolled type 2 DM with hyperosmolar nonketotic hyperglycemia   Hypokalemia   ARF (acute renal failure)    Time spent: 50    Illa Level [facility]  [facility] Pager [phone]. If 7PM-7AM, please contact night-coverage at [email or web address], password [facility] 04/27/2014, 2:22 PM  LOS: 1 day

## 2014-04-27 NOTE — ED Notes (Signed)
Attempt x1 

## 2014-04-27 NOTE — Progress Notes (Signed)
MD paged about Pt IV infiltrated twice. Pt received 3 of 4 runs of potassium. 30cc of Potassium left in 4th run of potassium. Awaiting call back, Day RN made aware.

## 2014-04-27 NOTE — Consult Note (Signed)
WOC wound consult note Reason for Consult: evaluation of back wound and LE wound.  Pt reports back wound has been around 7 months or so.  But bedside nurse states that patient said 2 years. Unclear just how long this has been present but concerned that it "Heal up some and then get worse again"  for dermatological issues which may need biopsy.  Pt reports that he has seen dermatologist a long time ago to have "boils lanced". Pt reports he is ambulatory, location not suggestive of pressure ulcer.  He also has one open ulcer left pretibial that it is unclear the etiology, he has palpable pulses and no significant edema to suggest this is venous or arterial in nature. Pt when asked does not recall any trauma to the left pretibial area. For this reason feel that this patient should follow up with dermatology as an outpatient to evaluate both ulcerations Wound type: full thickness ulcerations LLE and spine Measurement: spinal wound: 2.5x 1.5cm x 0.2cm; left pretibial: 2.0cm x 1.5cm x 0.2cm  Wound bed: spinal wound-fibrin and grey tissue over 100% of wound bed, Left pretibal- 50% pink, 50% yellow slough.  Drainage (amount, consistency, odor) minimal at each site, serosanguinous   Periwound:  Intact, some epibole of the wound edges on the thoracic area  Dressing procedure/placement/frequency: Enzymatic debridement ointment for the left pretibial area to clear away the slough, cover with moist gauze, change daily.  Silicone foam dressing for the thoracic spine ulcer, to protect from further injury, change every 3 days.  Discussed POC with patient and bedside nurse.  Re consult if needed, will not follow at this time. Thanks  Valina Maes [employer], CWOCN [phone])

## 2014-04-28 DIAGNOSIS — I82409 Acute embolism and thrombosis of unspecified deep veins of unspecified lower extremity: Secondary | ICD-10-CM

## 2014-04-28 DIAGNOSIS — Z95810 Presence of automatic (implantable) cardiac defibrillator: Secondary | ICD-10-CM

## 2014-04-28 DIAGNOSIS — E876 Hypokalemia: Secondary | ICD-10-CM

## 2014-04-28 DIAGNOSIS — N179 Acute kidney failure, unspecified: Secondary | ICD-10-CM

## 2014-04-28 DIAGNOSIS — E1101 Type 2 diabetes mellitus with hyperosmolarity with coma: Secondary | ICD-10-CM

## 2014-04-28 DIAGNOSIS — I5022 Chronic systolic (congestive) heart failure: Secondary | ICD-10-CM

## 2014-04-28 DIAGNOSIS — N400 Enlarged prostate without lower urinary tract symptoms: Secondary | ICD-10-CM

## 2014-04-28 DIAGNOSIS — I10 Essential (primary) hypertension: Secondary | ICD-10-CM

## 2014-04-28 DIAGNOSIS — E1165 Type 2 diabetes mellitus with hyperglycemia: Secondary | ICD-10-CM

## 2014-04-28 LAB — BASIC METABOLIC PANEL
Anion gap: 16 — ABNORMAL HIGH (ref 5–15)
BUN: 49 mg/dL — ABNORMAL HIGH (ref 6–23)
CHLORIDE: 90 meq/L — AB (ref 96–112)
CO2: 27 meq/L (ref 19–32)
Calcium: 8.5 mg/dL (ref 8.4–10.5)
Creatinine, Ser: 1.46 mg/dL — ABNORMAL HIGH (ref 0.50–1.35)
GFR calc Af Amer: 56 mL/min — ABNORMAL LOW (ref >90)
GFR, EST NON AFRICAN AMERICAN: 48 mL/min — AB (ref >90)
GLUCOSE: 239 mg/dL — AB (ref 70–99)
POTASSIUM: 3.2 meq/L — AB (ref 3.7–5.3)
Sodium: 133 mEq/L — ABNORMAL LOW (ref 137–147)

## 2014-04-28 LAB — CBC
HCT: 42.3 % (ref 39.0–52.0)
HEMOGLOBIN: 14.4 g/dL (ref 13.0–17.0)
MCH: 29 pg (ref 26.0–34.0)
MCHC: 34 g/dL (ref 30.0–36.0)
MCV: 85.3 fL (ref 78.0–100.0)
Platelets: 158 10*3/uL (ref 150–400)
RBC: 4.96 MIL/uL (ref 4.22–5.81)
RDW: 12.4 % (ref 11.5–15.5)
WBC: 4.4 10*3/uL (ref 4.0–10.5)

## 2014-04-28 LAB — GLUCOSE, CAPILLARY
GLUCOSE-CAPILLARY: 147 mg/dL — AB (ref 70–99)
GLUCOSE-CAPILLARY: 251 mg/dL — AB (ref 70–99)
GLUCOSE-CAPILLARY: 116 mg/dL — AB (ref 70–99)
Glucose-Capillary: 224 mg/dL — ABNORMAL HIGH (ref 70–99)

## 2014-04-28 MED ORDER — MAGNESIUM SULFATE 2 GM/50ML IV SOLN
2.0000 g | Freq: Three times a day (TID) | INTRAVENOUS | Status: AC
  Administered 2014-04-28 (×2): 2 g via INTRAVENOUS
  Filled 2014-04-28 (×2): qty 50

## 2014-04-28 MED ORDER — INSULIN GLARGINE 100 UNIT/ML ~~LOC~~ SOLN
25.0000 [IU] | Freq: Two times a day (BID) | SUBCUTANEOUS | Status: DC
Start: 2014-04-28 — End: 2014-04-29
  Administered 2014-04-28 – 2014-04-29 (×3): 25 [IU] via SUBCUTANEOUS
  Filled 2014-04-28 (×4): qty 0.25

## 2014-04-28 MED ORDER — POTASSIUM CHLORIDE CRYS ER 20 MEQ PO TBCR
40.0000 meq | EXTENDED_RELEASE_TABLET | Freq: Four times a day (QID) | ORAL | Status: DC
  Filled 2014-04-28: qty 2

## 2014-04-28 MED ORDER — CALCIUM CARBONATE ANTACID 500 MG PO CHEW
1.0000 | CHEWABLE_TABLET | Freq: Three times a day (TID) | ORAL | Status: DC | PRN
  Administered 2014-04-28: 200 mg via ORAL
  Filled 2014-04-28 (×2): qty 1

## 2014-04-28 MED ORDER — POTASSIUM CHLORIDE 20 MEQ/15ML (10%) PO SOLN
40.0000 meq | Freq: Four times a day (QID) | ORAL | Status: AC
  Administered 2014-04-28 (×2): 40 meq via ORAL
  Filled 2014-04-28 (×2): qty 30

## 2014-04-28 NOTE — Progress Notes (Signed)
Inpatient Diabetes Program Recommendations  AACE/ADA: New Consensus Statement on Inpatient Glycemic Control (2013)  Target Ranges:  Prepandial:   less than 140 mg/dL      Peak postprandial:   less than 180 mg/dL (1-2 hours)      Critically ill patients:  140 - 180 mg/dL   Results for REESE, [name] (MRN [id number]) as of 04/28/2014 13:06  Ref. Range 04/27/2014 15:25 04/27/2014 17:10 04/27/2014 22:01 04/28/2014 07:57 04/28/2014 12:03  Glucose-Capillary Latest Range: 70-99 mg/dL 817 (H) 711 (H) 657 (H) 224 (H) 147 (H)   Reason for assessment: elevated CBg  Diabetes history: Type 2 Outpatient Diabetes medications: Lantus 30 units at hs, Starlix 60mg /day Current orders for Inpatient glycemic control: Lantus 25 units qhs, Novolog moderate correction tid   Please consider adding Novolog correction at hs and adding Novolog mealtime insulin 4 units tid    [name], RN, [place], [place], CDE Diabetes Coordinator Inpatient Diabetes Program  [phone] (Team Pager) [phone] [name]) 04/28/2014 1:21 PM

## 2014-04-28 NOTE — Progress Notes (Signed)
Pt requesting to talk with diabetes coordinator. Day shift RN will be made aware. Pt continuously c/o of Ulcer on back itching and wanting it rubbed.

## 2014-04-28 NOTE — Progress Notes (Signed)
[facility] PROGRESS NOTE  DRAKKAR SERGIO AST:[id number] DOB: [birth year] DOA: 04/26/2014 PCP: Jackie Plum, MD    HPI/Subjective: [name] is a 68 yo male with PMH of diabetes mellitus, chronic venous insufficiency, coronary artery disease, chronic systolic heart failure, CK D stage II, nonischemic cardiomyopathy, hypertension, hyperlipidemia, GERD, BPH,history of DVT ( not on anticoag due to hx of GIB ,AVM ( duodenal, with GI bleed), low back pain, and headache.  Patient presented to the ED with complaints of bilateral lower extremity ulcers and back ulcer.  He admits to increased polyuria polydipsia for the past 1 month and states his sugars are always elevated in the 400 range.  He states that the ulcer has been present for years, but recently with increased pain.  He states his is compliant with his diabetes medications and his diet.  He states he takes Lantus 20 units at night and Starlix. He denies any recent illness, chest pain, shortness of breath, orthopnea, or leg edema. In ED, patient is found with elevated CBGs more than 600, potassium of 2.7, and worsening renal failure with creatinine of 2.89.   Denies any specific complaints today, blood sugar still in the high side after starting subcutaneous insulin.  Assessment/Plan:  Uncontrolled type II DM with hyperosmolar nonketotic hyperglycemia -Presented with CBGs greater than 600- reports compliance with diabetes medication.  No ketones on urine. -Patient placed on glucose stabilizer and is subsequently transition to SQ Lantus 30 units daily, and SSI after CBGs at target -hgb A1c-15.2- will likely need adjustment of home diabetes meds -Restarted at 30 units last night, blood sugar is above 220 this morning. -Lantus increased to 25 units BID, expect discharge in a.m.  Hypokalemia and hypomagnesemia -K 3.5, improved from 2.7 on admission -Placed on 4 runs of IV potassium and Kdur 40 meq X2 -magnesium 1.2-replete with 2  g of IV magnesium x2 check in AM. -monitor BMET and magnesium in the a.m.  Chronic systolic heart failure -Appears dehydrated on exam -2D echo 04/2013- EF 20-25%, grade II diastolic dysfunction -Continue digoxin 0.125mg  daily; continue Imdur 30 MG daily    Acute on chronic renal failure stage III -Creat 1.97; baseline around 1.7 -we'll hold ACEI -Continue gentile IV hydration in setting of CHF at 19ml/hr -repeat BMET in a.m.  Multiple ulcers -Bilateral lower extremities and back with small noninfected ulcers -Appreciate wound care rec's- Santyl ointment, outpatient dermatology follow-up for further evaluation of ulcers. -Continue pain management when necessary.  History of CAD -Denies current chest pain.  Troponin 1 negative -status post ICD -continue telemonitoring  Hypertension -Blood pressure controlled.  Patient with episodes of elevated BP on admission -Hold ACE inhibitor.  Continue  Metoprolol 12.5 MG daily, hydralazine 10mg  TID, and Imdur 30mg  daily  History of gout -denies any current foot pain -Uric Acid normal -Continue Allopurinol 300 MG  Hyperlipidemia -Continue Lipitor 20 MG daily  History of DVT -Status post IVC filter, not anticoagulated due to GIB from AVMs  BPH Continue Flomax 0.4 MG daily  Hx of Gastric AVM -stable with no current bleed -due to hx of GIB, not anticoag for DVTs  GERD -Continue Protonix 40mg  daily  DVT Prophylaxis SQ heparin Code Status: full Family Communication: No family at bedside Disposition Plan: Inpatient   Consultants:  Wound care/ diabetic coordinator  Procedures:  None  Antibiotics:  None   Objective: Filed Vitals:   04/28/14 1051  BP: 131/87  Pulse: 87  Temp: 98 F (36.7 C)  Resp:  Intake/Output Summary (Last 24 hours) at 04/28/14 1145 Last data filed at 04/28/14 1055  Gross per 24 hour  Intake    840 ml  Output   1640 ml  Net   -800 ml   Filed Weights   04/27/14 0240  Weight: 56.745  kg (125 lb 1.6 oz)    Exam:  Gen: Alert and oriented chronically sick looking AA male in NAD. HEENT: Normocephalic, atraumatic.  Pupils symmertrical.  Moist mucosa.   Chest: clear to auscultate bilaterally, no ronchi or rales.  Back with small non infected ulcer. Cardiac: Regular rate and rhythm, S1-S2, no rubs murmurs or gallops  Abdomen: soft, non tender, non distended, +bowel sounds. No guarding or rigidity  Extremities: Symmetrical in appearance without cyanosis or edema.  With chronic venous skin changes. BLE- with small non infected ulcer   Neurological: Alert awake oriented to time place and person.  Psychiatric: Appears normal.   Data Reviewed: Basic Metabolic Panel:  Recent Labs Lab 04/26/14 2306 04/27/14 0315 04/27/14 0825 04/27/14 1140 04/28/14 0545  NA 122* 123* 131* 134* 133*  K 2.7* 3.6* 3.4* 3.5* 3.2*  CL 74* 77* 86* 88* 90*  CO2 28 29 24 27 27   GLUCOSE 587* 487* 241* 67* 239*  BUN 80* 75* 72* 69* 49*  CREATININE 2.89* 2.65* 2.08* 1.97* 1.46*  CALCIUM 10.1 9.3 9.6 9.4 8.5  MG 1.2*  --   --   --   --    Liver Function Tests:  Recent Labs Lab 04/26/14 2306  AST 34  ALT 24  ALKPHOS 101  BILITOT 1.0  PROT 8.7*  ALBUMIN 4.8   No results for input(s): LIPASE, AMYLASE in the last 168 hours. No results for input(s): AMMONIA in the last 168 hours. CBC:  Recent Labs Lab 04/26/14 2306 04/28/14 0545  WBC 5.3 4.4  HGB 16.5 14.4  HCT 47.0 42.3  MCV 83.9 85.3  PLT 164 158   Cardiac Enzymes:  Recent Labs Lab 04/27/14 0315  TROPONINI <0.30   BNP (last 3 results)  Recent Labs  08/10/13 1650  PROBNP 1181.0*   CBG:  Recent Labs Lab 04/27/14 1142 04/27/14 1525 04/27/14 1710 04/27/14 2201 04/28/14 0757  GLUCAP 74 220* 220* 327* 224*    Recent Results (from the past 240 hour(s))  MRSA PCR Screening     Status: None   Collection Time: 04/27/14  2:58 AM  Result Value Ref Range Status   MRSA by PCR NEGATIVE NEGATIVE Final    Comment:         The GeneXpert MRSA Assay (FDA approved for NASAL specimens only), is one component of a comprehensive MRSA colonization surveillance program. It is not intended to diagnose MRSA infection nor to guide or monitor treatment for MRSA infections.      Studies: No results found.  Scheduled Meds: . allopurinol  300 mg Oral Daily  . atorvastatin  20 mg Oral Daily  . collagenase   Topical Daily  . digoxin  0.125 mg Oral Daily  . heparin  5,000 Units Subcutaneous 3 times per day  . hydrALAZINE  10 mg Oral TID  . insulin aspart  0-15 Units Subcutaneous TID WC  . insulin glargine  25 Units Subcutaneous BID  . isosorbide mononitrate  30 mg Oral Daily  . metoprolol succinate  12.5 mg Oral Daily  . pantoprazole  40 mg Oral Daily  . potassium chloride  40 mEq Oral Q6H  . senna  1 tablet Oral BID  . tamsulosin  0.4 mg Oral QPC supper   Continuous Infusions:    Active Problems:   Essential hypertension, benign   SYSTOLIC HEART FAILURE, CHRONIC   DVT (deep venous thrombosis)   Gastric AVM   BPH (benign prostatic hyperplasia)   ICD (implantable cardioverter-defibrillator) in place   Uncontrolled type 2 DM with hyperosmolar nonketotic hyperglycemia   Hypokalemia   ARF (acute renal failure)    Time spent: 35    Uziah Sorter A, MD  [facility] Pager [phone]. If 7PM-7AM, please contact night-coverage at [email or web address], password [email or web address] 04/28/2014, 11:45 AM  LOS: 2 days

## 2014-04-28 NOTE — Progress Notes (Signed)
Patient refused oral PO k+ verbalizing that he is unable to swallow these big pill. [facility] called and change to oral liquid form.   Sim Boast, RN

## 2014-04-28 NOTE — Plan of Care (Signed)
Problem: Phase I Progression Outcomes Goal: Voiding-avoid urinary catheter unless indicated Outcome: Completed/Met Date Met:  04/28/14

## 2014-04-28 NOTE — Plan of Care (Signed)
Problem: Phase I Progression Outcomes Goal: Pain controlled with appropriate interventions Outcome: Completed/Met Date Met:  04/28/14

## 2014-04-28 NOTE — Plan of Care (Signed)
Problem: Phase I Progression Outcomes Goal: OOB as tolerated unless otherwise ordered Outcome: Completed/Met Date Met:  04/28/14     

## 2014-04-28 NOTE — Progress Notes (Signed)
Medicare Important Message given?  YES (If response is "NO", the following Medicare IM given date fields will be blank) Date Medicare IM given:   Medicare IM given by:  Fonda Rochon 

## 2014-04-28 NOTE — Progress Notes (Signed)
   04/28/14 1324  Pressure Ulcer Prevention  Repositioned Sitting  Positioning Frequency Able to turn self  Hygiene  Hygiene Peri care  Level of Assistance Independent  Mobility  Activity Ambulate in hall;Ambulate in room;Back to bed (sitting on the side of the bed)  Level of Assistance Independent  Ambulation Response Tolerated well  Range of Motion Active;All extremities

## 2014-04-29 DIAGNOSIS — Q2733 Arteriovenous malformation of digestive system vessel: Secondary | ICD-10-CM

## 2014-04-29 LAB — RENAL FUNCTION PANEL
ANION GAP: 17 — AB (ref 5–15)
Albumin: 4.2 g/dL (ref 3.5–5.2)
BUN: 34 mg/dL — AB (ref 6–23)
CHLORIDE: 93 meq/L — AB (ref 96–112)
CO2: 25 meq/L (ref 19–32)
Calcium: 9.1 mg/dL (ref 8.4–10.5)
Creatinine, Ser: 1.33 mg/dL (ref 0.50–1.35)
GFR calc Af Amer: 63 mL/min — ABNORMAL LOW (ref >90)
GFR calc non Af Amer: 54 mL/min — ABNORMAL LOW (ref >90)
Glucose, Bld: 196 mg/dL — ABNORMAL HIGH (ref 70–99)
Phosphorus: 1.5 mg/dL — ABNORMAL LOW (ref 2.3–4.6)
Potassium: 3.8 mEq/L (ref 3.7–5.3)
Sodium: 135 mEq/L — ABNORMAL LOW (ref 137–147)

## 2014-04-29 LAB — GLUCOSE, CAPILLARY: Glucose-Capillary: 104 mg/dL — ABNORMAL HIGH (ref 70–99)

## 2014-04-29 LAB — MAGNESIUM: Magnesium: 2.5 mg/dL (ref 1.5–2.5)

## 2014-04-29 MED ORDER — INSULIN GLARGINE 100 UNIT/ML SOLOSTAR PEN: 25.0000 [IU] | PEN_INJECTOR | Freq: Two times a day (BID) | SUBCUTANEOUS | Status: DC

## 2014-04-29 NOTE — Discharge Summary (Signed)
Physician Discharge Summary  Timothy BisMelvin D Kloehn ZOX:[id number] DOB: [birth year] DOA: 04/26/2014  PCP: Jackie PlumSEI-BONSU,GEORGE, MD  Admit date: 04/26/2014 Discharge date: 04/29/2014  Time spent: 40 minutes  Recommendations for Outpatient Follow-up:  1. Follow-up with primary care physician within one week. 2. Lantus iincreased to 25 units twice a day 3. Instructed check his blood sugar BID and bring the log to his PCP for further adjustment of the insulin regimen.  Discharge Diagnoses:  Active Problems:   Essential hypertension, benign   SYSTOLIC HEART FAILURE, CHRONIC   Gastric AVM   BPH (benign prostatic hyperplasia)   ICD (implantable cardioverter-defibrillator) in place   Uncontrolled type 2 DM with hyperosmolar nonketotic hyperglycemia   Hypokalemia   ARF (acute renal failure)   Discharge Condition: Stable  Diet recommendation: Heart healthy/carbohydrate modified diet  Filed Weights   04/27/14 0240  Weight: 56.745 kg (125 lb 1.6 oz)    History of present illness:  [name] is a 68 y.o. male with a Past Medical History of chronic systolic heart failure status post AICD placement, history of recurrent venous thromboembolism not on anticoagulation due to history of GI bleeding-has IVC filter, chronic kidney disease stage III, who presents today with the above noted complaint. Per patient, for the past few weeks he has had 1 small ulceration in both of his lower extremities that are painful. He presented to the emergency room for this, however upon further evaluation was found to have CBG more than 600, potassium of 2.7 and worsening of his renal failure with a creatinine of 2.89. Patient does endorse significant polyuria and polydipsia for the past 1 month. He claims that his sugars have been persistently in the 300-400 range for the past 1 month. He claims that he takes Lantus 20 units at night, and takes Starlix for his diabetes. He claims he has been compliant with these medications  and also been compliant with diet. He denies any chest pain or shortness of breath, claims that he can easily walk a mile on a daily basis. He denies any orthopnea or PND or leg swelling. Patient denies any fever, headache, chest pain, shortness of breath, nausea, vomiting, diarrhea  Hospital Course:    Uncontrolled type II DM with hyperosmolar nonketotic hyperglycemia -Presented with CBGs greater than 600, reports compliance with diabetes medication. No ketones on urine. -Patient placed on glucose stabilizer and is subsequently transition to SQ Lantus 30 units daily, and SSI after CBGs at target -hgb A1c-15.2- will likely need adjustment of home diabetes meds -Restarted at 30 units last night, blood sugar is above 220 this morning. -Lantus increased to 25 units BID, Starlix restarted at the time of discharge. -Patient might need further adjustment of his insulin regimen, might need prandial NovoLog instead of the Starlix.  Hypokalemia and hypomagnesemia -Potassium was 2.7 on admission, magnesium was 1.2. -Magnesium repleted parenterally. -Potasium replaced orally and parenterally   Chronic systolic heart failure -Appears dehydrated on exam -2D echo 04/2013- EF 20-25%, grade II diastolic dysfunction -Home medications continued including Toprol-XL, lisinopril, hydralazine, Imdur, Demadex and digoxin. -At the time of discharge no symptoms or signs suggesting decompensation.  -Recommend to follow-up with cardiology as outpatient, he needs reevaluation and to repeat his echo.   Acute on chronic renal failure stage III Creatinine was 2.89 on admission -His Demadex and lisinopril held at the time of admission, patient was started on gentle IV fluid hydration. -Was on normal saline at 50 mL per hour for about 1 day, creatinine went down  to 1.33 on discharge.  -As mentioned above no symptoms or signs of fluid overload, clear lungs and very skinny legs with no edema.   Multiple  ulcers -Bilateral lower extremities and back with small noninfected ulcers -Appreciate wound care rec's- Santyl ointment, outpatient dermatology follow-up for further evaluation of ulcers. -Continue pain management when necessary.  History of CAD -Denies current chest pain. Troponin 1 negative -status post ICD  Hypertension -Blood pressure controlled. Patient with episodes of elevated BP on admission -Hold ACE inhibitor. Continue Metoprolol 12.5 MG daily, hydralazine 10mg  TID, and Imdur 30mg  daily  History of gout -denies any current foot pain -Uric Acid normal -Continue Allopurinol 300 MG  Hyperlipidemia -Continue Lipitor 20 MG daily  History of DVT -Status post IVC filter, not anticoagulated due to GIB from AVMs. -?DVT on duplex ultrasound in January 2013.   BPH Continue Flomax 0.4 MG daily  Hx of Gastric AVM -stable with no current bleed -due to hx of GIB, not anticoag for DVTs  GERD -Continue Protonix 40mg  daily  Procedures:  None  Consultations:  None  Discharge Exam: Filed Vitals:   04/29/14 0532  BP: 125/85  Pulse: 83  Temp: 98.1 F (36.7 C)  Resp: 18   General: Alert and awake, oriented x3, not in any acute distress. HEENT: anicteric sclera, pupils reactive to light and accommodation, EOMI CVS: S1-S2 clear, no murmur rubs or gallops Chest: clear to auscultation bilaterally, no wheezing, rales or rhonchi Abdomen: soft nontender, nondistended, normal bowel sounds, no organomegaly Extremities: no cyanosis, clubbing or edema noted bilaterally Neuro: Cranial nerves II-XII intact, no focal neurological deficits  Discharge Instructions You were cared for by a hospitalist during your hospital stay. If you have any questions about your discharge medications or the care you received while you were in the hospital after you are discharged, you can call the unit and asked to speak with the hospitalist on call if the hospitalist that took care of you is  not available. Once you are discharged, your primary care physician will handle any further medical issues. Please note that NO REFILLS for any discharge medications will be authorized once you are discharged, as it is imperative that you return to your primary care physician (or establish a relationship with a primary care physician if you do not have one) for your aftercare needs so that they can reassess your need for medications and monitor your lab values.  Discharge Instructions    Diet Carb Modified    Complete by:  As directed      Increase activity slowly    Complete by:  As directed           Current Discharge Medication List    CONTINUE these medications which have CHANGED   Details  Insulin Glargine (LANTUS SOLOSTAR) 100 UNIT/ML Solostar Pen Inject 25 Units into the skin 2 (two) times daily.      CONTINUE these medications which have NOT CHANGED   Details  !! allopurinol (ZYLOPRIM) 100 MG tablet Take 300 mg by mouth daily.    atorvastatin (LIPITOR) 20 MG tablet Take 20 mg by mouth every morning.     digoxin (LANOXIN) 0.125 MG tablet Take 0.125 mg by mouth daily.    eplerenone (INSPRA) 25 MG tablet Take 25 mg by mouth daily.    esomeprazole (NEXIUM) 40 MG capsule Take 40 mg by mouth daily at 12 noon.    famotidine (PEPCID) 20 MG tablet Take 20 mg by mouth daily.    hydrALAZINE (  APRESOLINE) 10 MG tablet Take 10 mg by mouth 3 (three) times daily.     isosorbide mononitrate (IMDUR) 30 MG 24 hr tablet Take 30 mg by mouth daily.    lisinopril (PRINIVIL,ZESTRIL) 5 MG tablet Take 5 mg by mouth every evening.    metolazone (ZAROXOLYN) 2.5 MG tablet Take 2.5 mg by mouth daily as needed (for increased fluid.).    metoprolol succinate (TOPROL-XL) 25 MG 24 hr tablet Take 12.5 mg by mouth daily.     nateglinide (STARLIX) 60 MG tablet Take 120 mg by mouth 3 (three) times daily.     tamsulosin (FLOMAX) 0.4 MG CAPS capsule Take 0.4 mg by mouth daily after supper.    torsemide  (DEMADEX) 20 MG tablet Take 20 mg by mouth every evening.    !! allopurinol (ZYLOPRIM) 100 MG tablet TAKE 1 TABLET BY MOUTH DAILY Qty: 90 tablet, Refills: 3    HYDROcodone-acetaminophen (NORCO/VICODIN) 5-325 MG per tablet Take 1 tablet by mouth every 6 (six) hours as needed. Qty: 10 tablet, Refills: 0     !! - Potential duplicate medications found. Please discuss with provider.    STOP taking these medications     clindamycin (CLEOCIN) 150 MG capsule        No Known Allergies    The results of significant diagnostics from this hospitalization (including imaging, microbiology, ancillary and laboratory) are listed below for reference.    Significant Diagnostic Studies: No results found.  Microbiology: Recent Results (from the past 240 hour(s))  MRSA PCR Screening     Status: None   Collection Time: 04/27/14  2:58 AM  Result Value Ref Range Status   MRSA by PCR NEGATIVE NEGATIVE Final    Comment:        The GeneXpert MRSA Assay (FDA approved for NASAL specimens only), is one component of a comprehensive MRSA colonization surveillance program. It is not intended to diagnose MRSA infection nor to guide or monitor treatment for MRSA infections.      Labs: Basic Metabolic Panel:  Recent Labs Lab 04/26/14 2306 04/27/14 0315 04/27/14 0825 04/27/14 1140 04/28/14 0545 04/29/14 0515  NA 122* 123* 131* 134* 133* 135*  K 2.7* 3.6* 3.4* 3.5* 3.2* 3.8  CL 74* 77* 86* 88* 90* 93*  CO2 28 29 24 27 27 25   GLUCOSE 587* 487* 241* 67* 239* 196*  BUN 80* 75* 72* 69* 49* 34*  CREATININE 2.89* 2.65* 2.08* 1.97* 1.46* 1.33  CALCIUM 10.1 9.3 9.6 9.4 8.5 9.1  MG 1.2*  --   --   --   --  2.5  PHOS  --   --   --   --   --  1.5*   Liver Function Tests:  Recent Labs Lab 04/26/14 2306 04/29/14 0515  AST 34  --   ALT 24  --   ALKPHOS 101  --   BILITOT 1.0  --   PROT 8.7*  --   ALBUMIN 4.8 4.2   No results for input(s): LIPASE, AMYLASE in the last 168 hours. No results  for input(s): AMMONIA in the last 168 hours. CBC:  Recent Labs Lab 04/26/14 2306 04/28/14 0545  WBC 5.3 4.4  HGB 16.5 14.4  HCT 47.0 42.3  MCV 83.9 85.3  PLT 164 158   Cardiac Enzymes:  Recent Labs Lab 04/27/14 0315  TROPONINI <0.30   BNP: BNP (last 3 results)  Recent Labs  08/10/13 1650  PROBNP 1181.0*   CBG:  Recent Labs Lab 04/28/14 0757  04/28/14 1203 04/28/14 1701 04/28/14 2158 04/29/14 0859  GLUCAP 224* 147* 251* 116* 104*       Signed:  Avid Guillette A  [facility] 04/29/2014, 10:11 AM

## 2014-04-29 NOTE — Progress Notes (Signed)
Patient discharge teaching given, including activity, diet, follow-up appoints, and medications. Patient verbalized understanding of all discharge instructions. IV access was d/c'd. Vitals are stable. Skin is intact except as charted in most recent assessments. Pt to be escorted out by NT, to be driven home by family. 

## 2014-05-04 NOTE — Care Management Note (Signed)
    Page 1 of 1   05/04/2014     8:14:53 AM CARE MANAGEMENT NOTE 05/04/2014  Patient:  [name], [name]   Account Number:  [id number]  Date Initiated:  05/01/2014  Documentation initiated by:  Letha Cape  Subjective/Objective Assessment:   admit- lives alone.     Action/Plan:   Anticipated DC Date:  04/29/2014   Anticipated DC Plan:  HOME/SELF CARE      DC Planning Services  CM consult      Choice offered to / List presented to:             Status of service:  Completed, signed off Medicare Important Message given?  YES (If response is "NO", the following Medicare IM given date fields will be blank) Date Medicare IM given:  04/28/2014 Medicare IM given by:  Letha Cape Date Additional Medicare IM given:   Additional Medicare IM given by:    Discharge Disposition:  HOME/SELF CARE  Per UR Regulation:  Reviewed for med. necessity/level of care/duration of stay  If discussed at Long Length of Stay Meetings, dates discussed:    Comments:

## 2014-05-08 ENCOUNTER — Ambulatory Visit: Payer: Medicare HMO | Admitting: Podiatry

## 2014-05-15 ENCOUNTER — Emergency Department (HOSPITAL_COMMUNITY): Payer: Medicare HMO

## 2014-05-15 ENCOUNTER — Inpatient Hospital Stay (HOSPITAL_COMMUNITY)
Admission: EM | Admit: 2014-05-15 | Discharge: 2014-05-17 | DRG: 637 | Disposition: A | Discharge status: Home / Self Care | Payer: Medicare HMO | Attending: Internal Medicine | Admitting: Internal Medicine

## 2014-05-15 ENCOUNTER — Encounter (HOSPITAL_COMMUNITY): Payer: Self-pay

## 2014-05-15 DIAGNOSIS — E870 Hyperosmolality and hypernatremia: Secondary | ICD-10-CM | POA: Diagnosis present

## 2014-05-15 DIAGNOSIS — I5022 Chronic systolic (congestive) heart failure: Secondary | ICD-10-CM | POA: Diagnosis present

## 2014-05-15 DIAGNOSIS — Z9119 Patient's noncompliance with other medical treatment and regimen: Secondary | ICD-10-CM | POA: Diagnosis present

## 2014-05-15 DIAGNOSIS — N183 Chronic kidney disease, stage 3 (moderate): Secondary | ICD-10-CM | POA: Diagnosis present

## 2014-05-15 DIAGNOSIS — I10 Essential (primary) hypertension: Secondary | ICD-10-CM | POA: Diagnosis present

## 2014-05-15 DIAGNOSIS — D649 Anemia, unspecified: Secondary | ICD-10-CM | POA: Diagnosis present

## 2014-05-15 DIAGNOSIS — I129 Hypertensive chronic kidney disease with stage 1 through stage 4 chronic kidney disease, or unspecified chronic kidney disease: Secondary | ICD-10-CM | POA: Diagnosis present

## 2014-05-15 DIAGNOSIS — M109 Gout, unspecified: Secondary | ICD-10-CM | POA: Diagnosis present

## 2014-05-15 DIAGNOSIS — E1165 Type 2 diabetes mellitus with hyperglycemia: Secondary | ICD-10-CM | POA: Diagnosis present

## 2014-05-15 DIAGNOSIS — I255 Ischemic cardiomyopathy: Secondary | ICD-10-CM | POA: Diagnosis present

## 2014-05-15 DIAGNOSIS — Z794 Long term (current) use of insulin: Secondary | ICD-10-CM

## 2014-05-15 DIAGNOSIS — E861 Hypovolemia: Secondary | ICD-10-CM | POA: Diagnosis present

## 2014-05-15 DIAGNOSIS — F17210 Nicotine dependence, cigarettes, uncomplicated: Secondary | ICD-10-CM | POA: Diagnosis present

## 2014-05-15 DIAGNOSIS — E1100 Type 2 diabetes mellitus with hyperosmolarity without nonketotic hyperglycemic-hyperosmolar coma (NKHHC): Principal | ICD-10-CM | POA: Diagnosis present

## 2014-05-15 DIAGNOSIS — Z681 Body mass index (BMI) 19 or less, adult: Secondary | ICD-10-CM

## 2014-05-15 DIAGNOSIS — N400 Enlarged prostate without lower urinary tract symptoms: Secondary | ICD-10-CM | POA: Diagnosis present

## 2014-05-15 DIAGNOSIS — I2510 Atherosclerotic heart disease of native coronary artery without angina pectoris: Secondary | ICD-10-CM | POA: Diagnosis present

## 2014-05-15 DIAGNOSIS — N179 Acute kidney failure, unspecified: Secondary | ICD-10-CM

## 2014-05-15 DIAGNOSIS — N189 Chronic kidney disease, unspecified: Secondary | ICD-10-CM

## 2014-05-15 DIAGNOSIS — E43 Unspecified severe protein-calorie malnutrition: Secondary | ICD-10-CM

## 2014-05-15 DIAGNOSIS — Z95810 Presence of automatic (implantable) cardiac defibrillator: Secondary | ICD-10-CM | POA: Diagnosis not present

## 2014-05-15 DIAGNOSIS — T502X5A Adverse effect of carbonic-anhydrase inhibitors, benzothiadiazides and other diuretics, initial encounter: Secondary | ICD-10-CM | POA: Diagnosis present

## 2014-05-15 DIAGNOSIS — E785 Hyperlipidemia, unspecified: Secondary | ICD-10-CM | POA: Diagnosis present

## 2014-05-15 DIAGNOSIS — R739 Hyperglycemia, unspecified: Secondary | ICD-10-CM

## 2014-05-15 DIAGNOSIS — Z86718 Personal history of other venous thrombosis and embolism: Secondary | ICD-10-CM

## 2014-05-15 DIAGNOSIS — L8992 Pressure ulcer of unspecified site, stage 2: Secondary | ICD-10-CM | POA: Diagnosis present

## 2014-05-15 DIAGNOSIS — E860 Dehydration: Secondary | ICD-10-CM | POA: Diagnosis present

## 2014-05-15 DIAGNOSIS — K219 Gastro-esophageal reflux disease without esophagitis: Secondary | ICD-10-CM | POA: Diagnosis present

## 2014-05-15 DIAGNOSIS — I509 Heart failure, unspecified: Secondary | ICD-10-CM

## 2014-05-15 DIAGNOSIS — I872 Venous insufficiency (chronic) (peripheral): Secondary | ICD-10-CM | POA: Diagnosis present

## 2014-05-15 DIAGNOSIS — E1101 Type 2 diabetes mellitus with hyperosmolarity with coma: Secondary | ICD-10-CM

## 2014-05-15 DIAGNOSIS — E876 Hypokalemia: Secondary | ICD-10-CM

## 2014-05-15 LAB — COMPREHENSIVE METABOLIC PANEL
ALT: 31 U/L (ref 0–53)
AST: 27 U/L (ref 0–37)
Albumin: 3.9 g/dL (ref 3.5–5.2)
Alkaline Phosphatase: 90 U/L (ref 39–117)
Anion gap: 16 — ABNORMAL HIGH (ref 5–15)
BILIRUBIN TOTAL: 1.7 mg/dL — AB (ref 0.3–1.2)
BUN: 78 mg/dL — ABNORMAL HIGH (ref 6–23)
CHLORIDE: 79 meq/L — AB (ref 96–112)
CO2: 35 mmol/L — ABNORMAL HIGH (ref 19–32)
Calcium: 12 mg/dL — ABNORMAL HIGH (ref 8.4–10.5)
Creatinine, Ser: 2.78 mg/dL — ABNORMAL HIGH (ref 0.50–1.35)
GFR calc Af Amer: 26 mL/min — ABNORMAL LOW (ref >90)
GFR calc non Af Amer: 22 mL/min — ABNORMAL LOW (ref >90)
Glucose, Bld: 494 mg/dL — ABNORMAL HIGH (ref 70–99)
Potassium: 3.3 mmol/L — ABNORMAL LOW (ref 3.5–5.1)
Sodium: 130 mmol/L — ABNORMAL LOW (ref 135–145)
Total Protein: 7.2 g/dL (ref 6.0–8.3)

## 2014-05-15 LAB — URINALYSIS, ROUTINE W REFLEX MICROSCOPIC
Bilirubin Urine: NEGATIVE
Hgb urine dipstick: NEGATIVE
KETONES UR: NEGATIVE mg/dL
LEUKOCYTES UA: NEGATIVE
Nitrite: NEGATIVE
PH: 6 (ref 5.0–8.0)
Protein, ur: NEGATIVE mg/dL
Specific Gravity, Urine: 1.016 (ref 1.005–1.030)
Urobilinogen, UA: 0.2 mg/dL (ref 0.0–1.0)

## 2014-05-15 LAB — CBC
HCT: 45.4 % (ref 39.0–52.0)
Hemoglobin: 15.3 g/dL (ref 13.0–17.0)
MCH: 28.8 pg (ref 26.0–34.0)
MCHC: 33.7 g/dL (ref 30.0–36.0)
MCV: 85.3 fL (ref 78.0–100.0)
Platelets: 115 10*3/uL — ABNORMAL LOW (ref 150–400)
RBC: 5.32 MIL/uL (ref 4.22–5.81)
RDW: 12.3 % (ref 11.5–15.5)
WBC: 5.5 10*3/uL (ref 4.0–10.5)

## 2014-05-15 LAB — URINE MICROSCOPIC-ADD ON

## 2014-05-15 LAB — BASIC METABOLIC PANEL
ANION GAP: 17 — AB (ref 5–15)
BUN: 76 mg/dL — ABNORMAL HIGH (ref 6–23)
CALCIUM: 11.5 mg/dL — AB (ref 8.4–10.5)
CO2: 32 mmol/L (ref 19–32)
Chloride: 85 mEq/L — ABNORMAL LOW (ref 96–112)
Creatinine, Ser: 2.57 mg/dL — ABNORMAL HIGH (ref 0.50–1.35)
GFR calc Af Amer: 28 mL/min — ABNORMAL LOW (ref >90)
GFR, EST NON AFRICAN AMERICAN: 24 mL/min — AB (ref >90)
Glucose, Bld: 367 mg/dL — ABNORMAL HIGH (ref 70–99)
POTASSIUM: 4 mmol/L (ref 3.5–5.1)
SODIUM: 134 mmol/L — AB (ref 135–145)

## 2014-05-15 LAB — CBG MONITORING, ED
GLUCOSE-CAPILLARY: 389 mg/dL — AB (ref 70–99)
GLUCOSE-CAPILLARY: 366 mg/dL — AB (ref 70–99)

## 2014-05-15 LAB — I-STAT TROPONIN, ED: Troponin i, poc: 0.06 ng/mL (ref 0.00–0.08)

## 2014-05-15 LAB — KETONES, QUALITATIVE: Acetone, Bld: NEGATIVE

## 2014-05-15 LAB — GLUCOSE, CAPILLARY: GLUCOSE-CAPILLARY: 339 mg/dL — AB (ref 70–99)

## 2014-05-15 LAB — BRAIN NATRIURETIC PEPTIDE: B NATRIURETIC PEPTIDE 5: 34 pg/mL (ref 0.0–100.0)

## 2014-05-15 LAB — DIGOXIN LEVEL

## 2014-05-15 LAB — LIPASE, BLOOD: Lipase: 120 U/L — ABNORMAL HIGH (ref 11–59)

## 2014-05-15 MED ORDER — ISOSORBIDE MONONITRATE ER 30 MG PO TB24
30.0000 mg | ORAL_TABLET | Freq: Every day | ORAL | Status: DC
  Administered 2014-05-15 – 2014-05-17 (×3): 30 mg via ORAL
  Filled 2014-05-15 (×3): qty 1

## 2014-05-15 MED ORDER — ALLOPURINOL 300 MG PO TABS
300.0000 mg | ORAL_TABLET | Freq: Every day | ORAL | Status: DC
  Administered 2014-05-15: 300 mg via ORAL
  Filled 2014-05-15: qty 1

## 2014-05-15 MED ORDER — POTASSIUM CHLORIDE 10 MEQ/100ML IV SOLN
10.0000 meq | INTRAVENOUS | Status: AC
  Administered 2014-05-15 – 2014-05-16 (×2): 10 meq via INTRAVENOUS
  Filled 2014-05-15 (×2): qty 100

## 2014-05-15 MED ORDER — SODIUM CHLORIDE 0.9 % IV SOLN
INTRAVENOUS | Status: DC
  Administered 2014-05-16: 3.1 [IU]/h via INTRAVENOUS
  Filled 2014-05-15: qty 2.5

## 2014-05-15 MED ORDER — FAMOTIDINE 20 MG PO TABS
20.0000 mg | ORAL_TABLET | Freq: Every day | ORAL | Status: DC
  Administered 2014-05-15 – 2014-05-17 (×3): 20 mg via ORAL
  Filled 2014-05-15 (×3): qty 1

## 2014-05-15 MED ORDER — SODIUM CHLORIDE 0.9 % IV BOLUS (SEPSIS): 1000.0000 mL | Freq: Once | INTRAVENOUS | Status: DC

## 2014-05-15 MED ORDER — SODIUM CHLORIDE 0.9 % IV BOLUS (SEPSIS): 500.0000 mL | Freq: Once | INTRAVENOUS | Status: DC

## 2014-05-15 MED ORDER — SODIUM CHLORIDE 0.9 % IV BOLUS (SEPSIS)
1000.0000 mL | Freq: Once | INTRAVENOUS | Status: AC
  Administered 2014-05-15: 1000 mL via INTRAVENOUS

## 2014-05-15 MED ORDER — INSULIN REGULAR HUMAN 100 UNIT/ML IJ SOLN
INTRAMUSCULAR | Status: DC
  Filled 2014-05-15: qty 2.5

## 2014-05-15 MED ORDER — ALLOPURINOL 100 MG PO TABS
100.0000 mg | ORAL_TABLET | Freq: Every day | ORAL | Status: DC
  Administered 2014-05-16 – 2014-05-17 (×2): 100 mg via ORAL
  Filled 2014-05-15 (×2): qty 1

## 2014-05-15 MED ORDER — DEXTROSE-NACL 5-0.45 % IV SOLN
INTRAVENOUS | Status: DC
  Administered 2014-05-16: 03:00:00 via INTRAVENOUS

## 2014-05-15 MED ORDER — METOPROLOL SUCCINATE 12.5 MG HALF TABLET
12.5000 mg | ORAL_TABLET | Freq: Every day | ORAL | Status: DC
  Administered 2014-05-15 – 2014-05-17 (×3): 12.5 mg via ORAL
  Filled 2014-05-15 (×3): qty 1

## 2014-05-15 MED ORDER — POTASSIUM CHLORIDE CRYS ER 20 MEQ PO TBCR
40.0000 meq | EXTENDED_RELEASE_TABLET | Freq: Once | ORAL | Status: AC
  Administered 2014-05-15: 40 meq via ORAL
  Filled 2014-05-15: qty 2

## 2014-05-15 MED ORDER — NATEGLINIDE 120 MG PO TABS
120.0000 mg | ORAL_TABLET | Freq: Three times a day (TID) | ORAL | Status: DC
  Administered 2014-05-16 – 2014-05-17 (×4): 120 mg via ORAL
  Filled 2014-05-15 (×7): qty 1

## 2014-05-15 MED ORDER — HEPARIN SODIUM (PORCINE) 5000 UNIT/ML IJ SOLN
5000.0000 [IU] | Freq: Three times a day (TID) | INTRAMUSCULAR | Status: DC
  Administered 2014-05-16 (×2): 5000 [IU] via SUBCUTANEOUS
  Filled 2014-05-15 (×6): qty 1

## 2014-05-15 MED ORDER — SODIUM CHLORIDE 0.9 % IV SOLN: INTRAVENOUS | Status: DC

## 2014-05-15 MED ORDER — PANTOPRAZOLE SODIUM 40 MG PO TBEC
40.0000 mg | DELAYED_RELEASE_TABLET | Freq: Every day | ORAL | Status: DC
Start: 2014-05-15 — End: 2014-05-17
  Administered 2014-05-15 – 2014-05-17 (×3): 40 mg via ORAL
  Filled 2014-05-15 (×3): qty 1

## 2014-05-15 MED ORDER — DIGOXIN 125 MCG PO TABS
0.1250 mg | ORAL_TABLET | Freq: Every day | ORAL | Status: DC
  Administered 2014-05-15 – 2014-05-17 (×3): 0.125 mg via ORAL
  Filled 2014-05-15 (×3): qty 1

## 2014-05-15 MED ORDER — POTASSIUM CHLORIDE 2 MEQ/ML IV SOLN
INTRAVENOUS | Status: DC
  Filled 2014-05-15 (×6): qty 1000

## 2014-05-15 MED ORDER — DEXTROSE-NACL 5-0.45 % IV SOLN: INTRAVENOUS | Status: DC

## 2014-05-15 MED ORDER — HYDROCODONE-ACETAMINOPHEN 5-325 MG PO TABS
1.0000 | ORAL_TABLET | Freq: Four times a day (QID) | ORAL | Status: DC | PRN
  Administered 2014-05-15 – 2014-05-17 (×2): 1 via ORAL
  Filled 2014-05-15 (×2): qty 1

## 2014-05-15 MED ORDER — ATORVASTATIN CALCIUM 20 MG PO TABS
20.0000 mg | ORAL_TABLET | ORAL | Status: DC
  Administered 2014-05-16 – 2014-05-17 (×2): 20 mg via ORAL
  Filled 2014-05-15 (×3): qty 1

## 2014-05-15 MED ORDER — TAMSULOSIN HCL 0.4 MG PO CAPS
0.4000 mg | ORAL_CAPSULE | Freq: Every day | ORAL | Status: DC
  Administered 2014-05-16: 0.4 mg via ORAL
  Filled 2014-05-15 (×2): qty 1

## 2014-05-15 NOTE — ED Notes (Addendum)
Per [facility]: pt. Complaint of generalized weakness and body aches x 1 week. Pt. Has aide at home who sts. That he has lost a lot of weight over the past 2 weeks and is not eating well. Body aches is 9/10. CBG 484 for EMS. Pt. Has pacer/defib. Denies CP, SOB.

## 2014-05-15 NOTE — ED Notes (Signed)
CBG 389. REPORTED TO PA.

## 2014-05-15 NOTE — H&P (Signed)
[facility] History and Physical  [name] QPR:[id number] DOB: 1946/02/15 DOA: 05/15/2014  Referring physician: Dr Adriana Simas PCP: Jackie Plum, MD   Chief Complaint:  Brought by nursing aide for dehydration and poor po intake  HPI:  68 year old male with chronic systolic CHF status post AICD placement (EF of 20-25%) , coronary artery disease , history of recurrent venous somewhat embolism but not on anticoagulation due to GI bleed (has IVC filter), chronic kidney disease stage III, uncontrolled type 2 diabetes mellitus, chronic anemia severe protein calorie malnutrition, chronic venous insufficiency, low back pain, with recent hospitalization for hyperosmolar nonketotic hyperglycemia and acute on chronic kidney disease was brought to the hospital by EMS after his nursing aide found that patient had poor appetite and generalized weakness. On questioning patient reports that his aide called the EMS for no clear reason and he does not know why he is brought to the hospital. Patient reports that he has had poor appetite for the past 2-3 days and has not been using his insulin since then. He reports his fingersticks to be in 300s and 400s. He reports taking his medications by himself and lives alone. Patient denies headache, dizziness, fever, chills, nausea , vomiting, chest pain, palpitations, SOB, abdominal pain, bowel or urinary symptoms. Denies any sick contact. Reports poor appetite. Denies any weakness. Patient's caregiver was not present at bedside.  Course in the ED Patient was found to be hypotensive to 88/61 mmHg with sinus tachycardia in 120s. Remaining vitals were stable. Blood work done showed WBC of 5.5, hemoglobin of 15.3, hematocrit of 45.4 and platelets of 115. Chemistry showed sodium of 1:30, potassium of 3.3, chloride of 79, CO2 of 35 with anion gap of 16.. Was 78 and creatinine of 2.78 (creatinine had improved to 1.33 upon recent discharge). Her glucose was 494. Calcium  of 12. Chest x-ray was unremarkable. Given 1.5 L IV normal saline bolus and hospitalists admission requested to stepdown.  Review of Systems:  Constitutional: Denies fever, chills, diaphoresis, appetite change and fatigue.  HEENT: Denies sore hearing symptoms, ear pain, difficulty swallowing, neck pain, congestion or rhinorrhea  Respiratory: Denies SOB, DOE, cough, chest tightness,  and wheezing.   Cardiovascular: Denies chest pain, palpitations and leg swelling.  Gastrointestinal: Denies nausea, vomiting, abdominal pain, diarrhea,  blood in stool and abdominal distention.  Genitourinary: Denies dysuria, urgency, frequency, hematuria, flank pain and difficulty urinating.  Endocrine: Reports polyuria, polydipsia, denies hot or cold intolerance Musculoskeletal: Denies myalgias, back pain, joint pain Skin: Denies rash and wound.  Neurological: Denies dizziness,  syncope, weakness, light-headedness,and headaches.  Psychiatric/Behavioral: Denies confusion Past Medical History  Diagnosis Date  . Venous thromboembolism     DVTs in 2004, 2005, 2007, 8/10, 8/11. IVC filter 2005. Off coumadin 04/2011 secondary to rectal bleeding  . 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 Chronic venous insufficiency   . DM2 (diabetes mellitus, type 2)   . GERD (gastroesophageal reflux disease)   . BPH (benign prostatic hypertrophy)   . Low back pain   . HTN (hypertension)   . HLD (hyperlipidemia)   . NICM (nonischemic cardiomyopathy) 1. 9/11  2. 2/12    1. Echo septal and apical akinesis, dilated LV, EF 45%, RV normal size and systolic function, EF 43% on Myoview  2. Admitted with decompensated CHF, echo EF 30-35% with diffuse hypokinesis but akinesis of mid to apical anteroseptal wall and apex, moderate LVH, mild MR, grade I diastolic dysfunction, SPEP/UP negative and HIV  negative. Most recent EF 10-15% 04/2011  . Smoking   . CAD (coronary artery disease) 1. 2007  2. 10/11    1. Left  heart cath with 40-50% stenosis in small RCA EF 50%  2. Lexiscan myoview EF 43%, global hypokinesis, possible small area of apical ischemia; LHC no angiographic CAD, no LV-gram done due to CKD   2. No angiographic CAD by cath 02/2010  . CKD (chronic kidney disease) stage 2, GFR 60-89 ml/min as of 02/2011  . Headache(784.0)   . Diabetes mellitus   . Blood transfusion   . Anemia   . Syncope     1. in setting of presumed orthostatic Hypotn 09/2011  . Right fibular fracture     1. 09/2011 - resulting from fall/syncope  . Closed right ankle fracture 10/10/2011   Past Surgical History  Procedure Laterality Date  . Cardiac catheterization    . Appendectomy    . Cholecystectomy    . Colonoscopy  05/01/2011    Procedure: COLONOSCOPY;  Surgeon: Louis Meckel, MD;  Location: [facility] ENDOSCOPY;  Service: Endoscopy;  Laterality: N/A;  . Orif ankle fracture  10/10/2011    Procedure: OPEN REDUCTION INTERNAL FIXATION (ORIF) ANKLE FRACTURE;  Surgeon: Eulas Post, MD;  Location: [facility] OR;  Service: Orthopedics;  Laterality: Right;  Right  Ankle Fracture ORIF  . Right heart catheterization N/A 06/25/2011    Procedure: RIGHT HEART CATH;  Surgeon: Dolores Patty, MD;  Location: [facility] CATH LAB;  Service: Cardiovascular;  Laterality: N/A;  . Implantable cardioverter defibrillator implant N/A 06/13/2013    Procedure: IMPLANTABLE CARDIOVERTER DEFIBRILLATOR IMPLANT;  Surgeon: Marinus Maw, MD;  Location: [facility] CATH LAB;  Service: Cardiovascular;  Laterality: N/A;   Social History:  reports that he has been smoking Cigarettes.  He has a 40 pack-year smoking history. He has never used smokeless tobacco. He reports that he drinks alcohol. He reports that he does not use illicit drugs.  No Known Allergies  Family History  Problem Relation Age of Onset  . Coronary artery disease Neg Hx     Premature    Prior to Admission medications   Medication Sig Start Date End Date Taking? Authorizing Provider  allopurinol  (ZYLOPRIM) 100 MG tablet Take 300 mg by mouth daily.   Yes Historical Provider, MD  atorvastatin (LIPITOR) 20 MG tablet Take 20 mg by mouth every morning.    Yes Historical Provider, MD  digoxin (LANOXIN) 0.125 MG tablet Take 0.125 mg by mouth daily.   Yes Historical Provider, MD  eplerenone (INSPRA) 25 MG tablet Take 25 mg by mouth daily.   Yes Historical Provider, MD  esomeprazole (NEXIUM) 40 MG capsule Take 40 mg by mouth daily at 12 noon.   Yes Historical Provider, MD  famotidine (PEPCID) 20 MG tablet Take 20 mg by mouth daily.   Yes Historical Provider, MD  hydrALAZINE (APRESOLINE) 10 MG tablet Take 10 mg by mouth 3 (three) times daily.  12/09/12  Yes Amy D Clegg, NP  Insulin Glargine (LANTUS SOLOSTAR) 100 UNIT/ML Solostar Pen Inject 25 Units into the skin 2 (two) times daily. 04/29/14  Yes Clydia Llano, MD  isosorbide mononitrate (IMDUR) 30 MG 24 hr tablet Take 30 mg by mouth daily. 12/09/12  Yes Amy D Clegg, NP  lisinopril (PRINIVIL,ZESTRIL) 5 MG tablet Take 5 mg by mouth every evening.   Yes Historical Provider, MD  metolazone (ZAROXOLYN) 2.5 MG tablet Take 2.5 mg by mouth daily as needed (for increased fluid.).  Yes Historical Provider, MD  metoprolol succinate (TOPROL-XL) 25 MG 24 hr tablet Take 12.5 mg by mouth daily.    Yes Historical Provider, MD  nateglinide (STARLIX) 60 MG tablet Take 120 mg by mouth 3 (three) times daily.  07/29/13  Yes Historical Provider, MD  tamsulosin (FLOMAX) 0.4 MG CAPS capsule Take 0.4 mg by mouth daily after supper.   Yes Historical Provider, MD  torsemide (DEMADEX) 20 MG tablet Take 20 mg by mouth every evening.   Yes Historical Provider, MD  allopurinol (ZYLOPRIM) 100 MG tablet TAKE 1 TABLET BY MOUTH DAILY 03/23/14   Aundria RudAli B Cosgrove, NP  HYDROcodone-acetaminophen (NORCO/VICODIN) 5-325 MG per tablet Take 1 tablet by mouth every 6 (six) hours as needed. Patient not taking: Reported on 04/26/2014 03/01/14   Roxy Horsemanobert Browning, PA-C     Physical Exam:  Filed  Vitals:   05/15/14 1830 05/15/14 1845 05/15/14 1900 05/15/14 1915  BP: 113/65 130/75 109/69 92/64  Pulse: 84 88 94 96  Temp:      TempSrc:      Resp: 16 27 18 18   Height:      Weight:      SpO2: 98% 100% 98% 100%    Constitutional: Vital signs reviewed.  Elderly cachectic male in no acute distress HEENT: Temporal wasting, no pallor, no icterus, moist oral mucosa, no cervical lymphadenopathy Cardiovascular: RRR, S1 normal, S2 normal, no MRG Chest: CTAB, no wheezes, rales, or rhonchi  Abdominal: Soft. Non-tender, non-distended, bowel sounds are normal,  Ext: warm, no edema, superficial skin tear on right leg  Neurological: [place] oriented, nonfocal  Labs on Admission:  Basic Metabolic Panel:  Recent Labs Lab 05/15/14 1445  NA 130*  K 3.3*  CL 79*  CO2 35*  GLUCOSE 494*  BUN 78*  CREATININE 2.78*  CALCIUM 12.0*   Liver Function Tests:  Recent Labs Lab 05/15/14 1445  AST 27  ALT 31  ALKPHOS 90  BILITOT 1.7*  PROT 7.2  ALBUMIN 3.9    Recent Labs Lab 05/15/14 1445  LIPASE 120*   No results for input(s): AMMONIA in the last 168 hours. CBC:  Recent Labs Lab 05/15/14 1445  WBC 5.5  HGB 15.3  HCT 45.4  MCV 85.3  PLT 115*   Cardiac Enzymes: No results for input(s): CKTOTAL, CKMB, CKMBINDEX, TROPONINI in the last 168 hours. BNP: Invalid input(s): POCBNP CBG:  Recent Labs Lab 05/15/14 1435 05/15/14 1652 05/15/14 1808  GLUCAP >600* 389* 366*    Radiological Exams on Admission: Dg Chest 2 View  05/15/2014   CLINICAL DATA:  Weakness, body aches  EXAM: CHEST  2 VIEW  COMPARISON:  06/14/2013  FINDINGS: Left lower lobe scarring/ atelectasis. No focal consolidation. No pleural effusion or pneumothorax.  The heart is normal in size.  Left subclavian ICD.  Mild degenerative changes of the visualized thoracolumbar spine.  IMPRESSION: No evidence of acute cardiopulmonary disease.   Electronically Signed   By: Charline BillsSriyesh  Krishnan M.D.   On: 05/15/2014 18:12     EKG: Sinus tachycardia at 120 with LVH, no new changes  Assessment/Plan  Principal Problem:   Uncontrolled type 2 DM with hyperosmolar nonketotic hyperglycemia Admit to stepdown Started on insulin drip in ED. continue IV hydration. Check BMET 2 hrs for closure of anion gap. Monitor k and mg. Once anion gap closes will add basal insulin and overlap drip for next 2 hours.  A1C of  15.2. Patient was recently discharged on increased dose of Lantus (25 units twice daily). Diabetic  coordinator consult.  Patient appears quite dehydrated on exam. Given his underlying ischemic cardiomyopathy to monitor volume overload with IV hydration. However at this time patient patient should be able to tolerate the fluids with close monitoring. -Patient recently hospitalized for similar uncontrolled diabetes. I'm not sure if patient is compliant with his insulin.  Active Problems:   Acute on chronic renal failure Likely secondary to dehydration with poor by mouth intake and hyperosmolar state. Patient is currently hypovolemic. Hold torsemide, metolazone, Eplerenone and ACE inhibitor. Monitor renal function closely with IV fluids.  Hypokalemia Replenish with by mouth and IV fluids. Check magnesium  Chronic CHF Echo from 2014 with EF of 20-25%. Patient is clinically compensated and hypovolemic at this time. Hold ACE inhibitor and torsemide at this time. Monitor strict I/O and daily weight. Patient is on digoxin with subtherapeutic levels. Continue home dose digoxin at this time. Monitor on telemetry. Has A ICD.    Essential hypertension, benign Hypotensive on admission. Continue metoprolol and Imdur. Hold ACE inhibitor , hydralazine and diuretics.  History of gout Complains of pain all over. Check uric acid level. Continue allopurinol  History of recurrent DVT Status post IVC filter. Not on any anticoagulation due to history of GI bleeding from gastric AVMs  BPH Continue Flomax  Weakness and severe  protein calorie malnutrition PT and nutrition consult. Patient may benefit from skilled nursing facility upon discharge.             Diet:cardiac/ diabetic  DVT prophylaxis: sq heparin. Monitor platelets closely.   Code Status: full code Family Communication: discussed with pt  at bedside Disposition Plan: Admit to stepdown. May benefit from skilled nursing facility upon discharge  Eddie North [facility] Pager [phone]  Total time spent on admission :70 minutes  If 7PM-7AM, please contact night-coverage [email or web address] Password [email or web address] 05/15/2014, 8:12 PM

## 2014-05-15 NOTE — ED Provider Notes (Signed)
CSN: [id number]     Arrival date & time 05/15/14  1423 History   First MD Initiated Contact with Patient 05/15/14 1510     Chief Complaint  Patient presents with  . Weakness     (Consider location/radiation/quality/duration/timing/severity/associated sxs/prior Treatment) The history is provided by the patient. No language interpreter was used.  [name] is a 68 y/o M with PMHx of CAD, DM, HTN, HLD, venous thromboembolism, AVM, GERD, BPH, NICM, pacemaker placed in 05/2013 presenting to the ED, BIB EMS, with generalized weakness and patient having weight loss. Patient reported that he has an aide named Peyton Najjar, who is currently not at bedside - who was concerned regarding the patient and called EMS. As per patient reported that he has been eating and has maintained a weight of 123 pounds. Patient could not really tell this provider why he was here in the ED - stated that he would like to "get out of here." Patient reported that he cannot get comfortable because he has aches all over his body. As per nurse report from EMS, reported that patient has not eaten in approximately 2-3 days and that patient has not been taking his insulin. Denied fever, nausea, vomiting, diarrhea, abdominal pain, neck pain, neck stiffness, fatigue, cough, nasal congestion, urinary issues, changes to bowel movements, urinary bowel continence, weakness. PCP Dr. Julio Sicks  Past Medical History  Diagnosis Date  . Venous thromboembolism     DVTs in 2004, 2005, 2007, 8/10, 8/11. IVC filter 2005. Off coumadin 04/2011 secondary to rectal bleeding  . 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 Chronic venous insufficiency   . DM2 (diabetes mellitus, type 2)   . GERD (gastroesophageal reflux disease)   . BPH (benign prostatic hypertrophy)   . Low back pain   . HTN (hypertension)   . HLD (hyperlipidemia)   . NICM (nonischemic cardiomyopathy) 1. 9/11  2. 2/12    1. Echo septal and apical akinesis,  dilated LV, EF 45%, RV normal size and systolic function, EF 43% on Myoview  2. Admitted with decompensated CHF, echo EF 30-35% with diffuse hypokinesis but akinesis of mid to apical anteroseptal wall and apex, moderate LVH, mild MR, grade I diastolic dysfunction, SPEP/UP negative and HIV negative. Most recent EF 10-15% 04/2011  . Smoking   . CAD (coronary artery disease) 1. 2007  2. 10/11    1. Left heart cath with 40-50% stenosis in small RCA EF 50%  2. Lexiscan myoview EF 43%, global hypokinesis, possible small area of apical ischemia; LHC no angiographic CAD, no LV-gram done due to CKD   2. No angiographic CAD by cath 02/2010  . CKD (chronic kidney disease) stage 2, GFR 60-89 ml/min as of 02/2011  . Headache(784.0)   . Diabetes mellitus   . Blood transfusion   . Anemia   . Syncope     1. in setting of presumed orthostatic Hypotn 09/2011  . Right fibular fracture     1. 09/2011 - resulting from fall/syncope  . Closed right ankle fracture 10/10/2011   Past Surgical History  Procedure Laterality Date  . Cardiac catheterization    . Appendectomy    . Cholecystectomy    . Colonoscopy  05/01/2011    Procedure: COLONOSCOPY;  Surgeon: Louis Meckel, MD;  [facility] ENDOSCOPY;  Service: Endoscopy;  Laterality: N/A;  . Orif ankle fracture  10/10/2011    Procedure: OPEN REDUCTION INTERNAL FIXATION (ORIF) ANKLE FRACTURE;  Surgeon: Ivin Booty  Robie RidgeP Landau, MD;  Location: [facility] OR;  Service: Orthopedics;  Laterality: Right;  Right  Ankle Fracture ORIF  . Right heart catheterization N/A 06/25/2011    Procedure: RIGHT HEART CATH;  Surgeon: Dolores Pattyaniel R Bensimhon, MD;  [facility];  Service: Cardiovascular;  Laterality: N/A;  . Implantable cardioverter defibrillator implant N/A 06/13/2013    Procedure: IMPLANTABLE CARDIOVERTER DEFIBRILLATOR IMPLANT;  Surgeon: Marinus MawGregg W Taylor, MD;  [facility];  Service: Cardiovascular;  Laterality: N/A;   Family History  Problem Relation Age of Onset  . Coronary  artery disease Neg Hx     Premature   History  Substance Use Topics  . Smoking status: Current Some Day Smoker -- 1.00 packs/day for 40 years    Types: Cigarettes    Last Attempt to Quit: 06/30/2011  . Smokeless tobacco: Never Used  . Alcohol Use: Yes     Comment: occasionally    Review of Systems  Constitutional: Positive for chills. Negative for fever.  Eyes: Negative for visual disturbance.  Respiratory: Negative for chest tightness and shortness of breath.   Cardiovascular: Negative for chest pain.  Gastrointestinal: Positive for abdominal pain. Negative for nausea, vomiting, diarrhea, constipation, blood in stool and anal bleeding.  Genitourinary: Negative for dysuria and decreased urine volume.  Musculoskeletal: Negative for back pain, neck pain and neck stiffness.  Neurological: Positive for weakness (generalized). Negative for dizziness, light-headedness and headaches.      Allergies  Review of patient's allergies indicates no known allergies.  Home Medications   Prior to Admission medications   Medication Sig Start Date End Date Taking? Authorizing Provider  allopurinol (ZYLOPRIM) 100 MG tablet Take 300 mg by mouth daily.   Yes Historical Provider, MD  atorvastatin (LIPITOR) 20 MG tablet Take 20 mg by mouth every morning.    Yes Historical Provider, MD  digoxin (LANOXIN) 0.125 MG tablet Take 0.125 mg by mouth daily.   Yes Historical Provider, MD  eplerenone (INSPRA) 25 MG tablet Take 25 mg by mouth daily.   Yes Historical Provider, MD  esomeprazole (NEXIUM) 40 MG capsule Take 40 mg by mouth daily at 12 noon.   Yes Historical Provider, MD  famotidine (PEPCID) 20 MG tablet Take 20 mg by mouth daily.   Yes Historical Provider, MD  hydrALAZINE (APRESOLINE) 10 MG tablet Take 10 mg by mouth 3 (three) times daily.  12/09/12  Yes Amy D Clegg, NP  Insulin Glargine (LANTUS SOLOSTAR) 100 UNIT/ML Solostar Pen Inject 25 Units into the skin 2 (two) times daily. 04/29/14  Yes Clydia LlanoMutaz  Elmahi, MD  isosorbide mononitrate (IMDUR) 30 MG 24 hr tablet Take 30 mg by mouth daily. 12/09/12  Yes Amy D Clegg, NP  lisinopril (PRINIVIL,ZESTRIL) 5 MG tablet Take 5 mg by mouth every evening.   Yes Historical Provider, MD  metolazone (ZAROXOLYN) 2.5 MG tablet Take 2.5 mg by mouth daily as needed (for increased fluid.).   Yes Historical Provider, MD  metoprolol succinate (TOPROL-XL) 25 MG 24 hr tablet Take 12.5 mg by mouth daily.    Yes Historical Provider, MD  nateglinide (STARLIX) 60 MG tablet Take 120 mg by mouth 3 (three) times daily.  07/29/13  Yes Historical Provider, MD  tamsulosin (FLOMAX) 0.4 MG CAPS capsule Take 0.4 mg by mouth daily after supper.   Yes Historical Provider, MD  torsemide (DEMADEX) 20 MG tablet Take 20 mg by mouth every evening.   Yes Historical Provider, MD  allopurinol (ZYLOPRIM) 100 MG tablet TAKE 1 TABLET BY MOUTH  DAILY 03/23/14   Aundria Rud, NP  HYDROcodone-acetaminophen (NORCO/VICODIN) 5-325 MG per tablet Take 1 tablet by mouth every 6 (six) hours as needed. Patient not taking: Reported on 04/26/2014 03/01/14   Roxy Horseman, PA-C   BP 92/64 mmHg  Pulse 96  Temp(Src) 98.1 F (36.7 C) (Rectal)  Resp 18  Ht 6' (1.829 m)  Wt 120 lb (54.432 kg)  BMI 16.27 kg/m2  SpO2 100% Physical Exam  Constitutional: He is oriented to person, place, and time.  Frail appearing elderly male, malnourished.   HENT:  Head: Normocephalic and atraumatic.  Eyes: Conjunctivae and EOM are normal. Pupils are equal, round, and reactive to light. Right eye exhibits no discharge. Left eye exhibits no discharge.  Neck: Normal range of motion. Neck supple. No tracheal deviation present.  Cardiovascular: Regular rhythm and normal heart sounds.  Tachycardia present.   Pulses:      Radial pulses are 2+ on the right side, and 2+ on the left side.       Dorsalis pedis pulses are 2+ on the right side, and 2+ on the left side.  Pulmonary/Chest: Effort normal and breath sounds normal. No  respiratory distress. He has no wheezes. He has no rales.  Pacemaker noted to the left side of the chest with negative findings of infection  Abdominal: Soft. Bowel sounds are normal. He exhibits no distension. There is tenderness. There is no rebound and no guarding.  Healed scar note to the right side of the abdomen  Negative abdominal distension  BS normoactive in all 4 quadrants Abdomen soft upon palpation Diffuse tenderness upon palpation to the abdomen   Negative peritoneal signs  Musculoskeletal: Normal range of motion.  Full ROM to upper and lower extremities without difficulty noted, negative ataxia noted.  Lymphadenopathy:    He has no cervical adenopathy.  Neurological: He is alert and oriented to person, place, and time. No cranial nerve deficit. He exhibits normal muscle tone. Coordination normal.  Cranial nerves III-XII grossly intact Strength 5+/5+ to upper and lower extremities bilaterally with resistance applied, equal distribution noted Weakness to grip strength to upper extremities bilaterally  Negative saddle paresthesias bilaterally  Negative arm drift Fine motor skills intact Patient follows commands well  Patient responds to questions appropriately  Skin: Skin is warm and dry. No rash noted. No erythema.  Darkening of the skin noted to the mid tibfib shaft and down to the toes bilaterally. Scattered sores without drainage or bleeding noted to the inner and outer aspects of the lower extremities bilaterally  Psychiatric: He has a normal mood and affect. His behavior is normal. Thought content normal.  Nursing note and vitals reviewed.   ED Course  Procedures (including critical care time)  Results for orders placed or performed during the hospital encounter of 05/15/14  CBC  Result Value Ref Range   WBC 5.5 4.0 - 10.5 K/uL   RBC 5.32 4.22 - 5.81 MIL/uL   Hemoglobin 15.3 13.0 - 17.0 g/dL   HCT 16.1 09.6 - 04.5 %   MCV 85.3 78.0 - 100.0 fL   MCH 28.8 26.0 -  34.0 pg   MCHC 33.7 30.0 - 36.0 g/dL   RDW 40.9 81.1 - 91.4 %   Platelets 115 (L) 150 - 400 K/uL  Comprehensive metabolic panel  Result Value Ref Range   Sodium 130 (L) 135 - 145 mmol/L   Potassium 3.3 (L) 3.5 - 5.1 mmol/L   Chloride 79 (L) 96 - 112 mEq/L  CO2 35 (H) 19 - 32 mmol/L   Glucose, Bld 494 (H) 70 - 99 mg/dL   BUN 78 (H) 6 - 23 mg/dL   Creatinine, Ser 1.61 (H) 0.50 - 1.35 mg/dL   Calcium 09.6 (H) 8.4 - 10.5 mg/dL   Total Protein 7.2 6.0 - 8.3 g/dL   Albumin 3.9 3.5 - 5.2 g/dL   AST 27 0 - 37 U/L   ALT 31 0 - 53 U/L   Alkaline Phosphatase 90 39 - 117 U/L   Total Bilirubin 1.7 (H) 0.3 - 1.2 mg/dL   GFR calc non Af Amer 22 (L) >90 mL/min   GFR calc Af Amer 26 (L) >90 mL/min   Anion gap 16 (H) 5 - 15  Urinalysis, Routine w reflex microscopic  Result Value Ref Range   Color, Urine YELLOW YELLOW   APPearance CLEAR CLEAR   Specific Gravity, Urine 1.016 1.005 - 1.030   pH 6.0 5.0 - 8.0   Glucose, UA >1000 (A) NEGATIVE mg/dL   Hgb urine dipstick NEGATIVE NEGATIVE   Bilirubin Urine NEGATIVE NEGATIVE   Ketones, ur NEGATIVE NEGATIVE mg/dL   Protein, ur NEGATIVE NEGATIVE mg/dL   Urobilinogen, UA 0.2 0.0 - 1.0 mg/dL   Nitrite NEGATIVE NEGATIVE   Leukocytes, UA NEGATIVE NEGATIVE  Digoxin level  Result Value Ref Range   Digoxin Level <0.2 (L) 0.8 - 2.0 ng/mL  Brain natriuretic peptide  Result Value Ref Range   B Natriuretic Peptide 34.0 0.0 - 100.0 pg/mL  Lipase, blood  Result Value Ref Range   Lipase 120 (H) 11 - 59 U/L  Urine microscopic-add on  Result Value Ref Range   Squamous Epithelial / LPF RARE RARE   WBC, UA 0-2 <3 WBC/hpf   Bacteria, UA RARE RARE   Casts HYALINE CASTS (A) NEGATIVE  Ketones, qualitative  Result Value Ref Range   Acetone, Bld NEGATIVE NEGATIVE  CBG monitoring, ED  Result Value Ref Range   Glucose-Capillary >600 (HH) 70 - 99 mg/dL  I-Stat Troponin, ED (not at [facility])  Result Value Ref Range   Troponin i, poc 0.06 0.00 - 0.08 ng/mL   Comment  3          CBG monitoring, ED  Result Value Ref Range   Glucose-Capillary 389 (H) 70 - 99 mg/dL  CBG monitoring, ED  Result Value Ref Range   Glucose-Capillary 366 (H) 70 - 99 mg/dL    Labs Review Labs Reviewed  CBC - Abnormal; Notable for the following:    Platelets 115 (*)    All other components within normal limits  COMPREHENSIVE METABOLIC PANEL - Abnormal; Notable for the following:    Sodium 130 (*)    Potassium 3.3 (*)    Chloride 79 (*)    CO2 35 (*)    Glucose, Bld 494 (*)    BUN 78 (*)    Creatinine, Ser 2.78 (*)    Calcium 12.0 (*)    Total Bilirubin 1.7 (*)    GFR calc non Af Amer 22 (*)    GFR calc Af Amer 26 (*)    Anion gap 16 (*)    All other components within normal limits  URINALYSIS, ROUTINE W REFLEX MICROSCOPIC - Abnormal; Notable for the following:    Glucose, UA >1000 (*)    All other components within normal limits  DIGOXIN LEVEL - Abnormal; Notable for the following:    Digoxin Level <0.2 (*)    All other components within normal limits  LIPASE, BLOOD - Abnormal; Notable for the following:    Lipase 120 (*)    All other components within normal limits  URINE MICROSCOPIC-ADD ON - Abnormal; Notable for the following:    Casts HYALINE CASTS (*)    All other components within normal limits  CBG MONITORING, ED - Abnormal; Notable for the following:    Glucose-Capillary >600 (*)    All other components within normal limits  CBG MONITORING, ED - Abnormal; Notable for the following:    Glucose-Capillary 389 (*)    All other components within normal limits  CBG MONITORING, ED - Abnormal; Notable for the following:    Glucose-Capillary 366 (*)    All other components within normal limits  BRAIN NATRIURETIC PEPTIDE  KETONES, QUALITATIVE  I-STAT TROPOININ, ED    Imaging Review Dg Chest 2 View  05/15/2014   CLINICAL DATA:  Weakness, body aches  EXAM: CHEST  2 VIEW  COMPARISON:  06/14/2013  FINDINGS: Left lower lobe scarring/ atelectasis. No focal  consolidation. No pleural effusion or pneumothorax.  The heart is normal in size.  Left subclavian ICD.  Mild degenerative changes of the visualized thoracolumbar spine.  IMPRESSION: No evidence of acute cardiopulmonary disease.   Electronically Signed   By: Charline Bills M.D.   On: 05/15/2014 18:12     EKG Interpretation   Date/Time:  Monday May 15 2014 14:35:32 EST Ventricular Rate:  120 PR Interval:  155 QRS Duration: 84 QT Interval:  304 QTC Calculation: 429 R Axis:   83 Text Interpretation:  Sinus tachycardia Consider right atrial enlargement  Borderline right axis deviation LVH with secondary repolarization  abnormality Anterior ST elevation, probably due to LVH Confirmed by Adriana Simas   MD, BRIAN ([id number]) on 05/15/2014 5:05:05 PM       7:22 PM Spoke with Dr. Gonzella Lex, [facility] - discussed case, labs, imaging, vitals, ED course in great detail. Patient to be admitted to stepdown under the care [facility].  MDM   Final diagnoses:  Acute renal failure, unspecified acute renal failure type  Hyperglycemia  Dehydration    Medications  sodium chloride 0.9 % bolus 1,000 mL (0 mLs Intravenous Stopped 05/15/14 1709)  sodium chloride 0.9 % bolus 1,000 mL (0 mLs Intravenous Stopped 05/15/14 1855)    Filed Vitals:   05/15/14 1830 05/15/14 1845 05/15/14 1900 05/15/14 1915  BP: 113/65 130/75 109/69 92/64  Pulse: 84 88 94 96  Temp:      TempSrc:      Resp: 16 27 18 18   Height:      Weight:      SpO2: 98% 100% 98% 100%   This provider reviewed the patient's chart. Patient was seen and assessed in the ED setting on 04/26/2014 where patient was admitted for hyperglycemia.  EKG noted sinus tachycardia with right atrial enlargement with borderline right axis deviation with secondary repolarization, heart rate 120 bpm. I-STAT troponin negative elevation. CBC unremarkable. CBG greater than 600. CMP noted hyponatremia of 1:30, mild hypokalemia 3.3. Low chloride of 79.  Bicarbonate mildly elevated at 35. Calcium elevated at 12.0. Bilirubin elevated at 1.7. BUN elevated at 78 and crit and elevated at 2.78-approximately 2 weeks ago patient's creatinine was 1.33. Lipase elevated at 120. Urinalysis negative for hemoglobin, nitrites, leukocytes-negative findings of infection. Negative ketones noted in urine. Negative acetones. Digoxin level less than 0.2. Chest x-ray negative for acute cardiopulmonary disease. After 1 L of fluids patient's glucose has decreased from 494 to 366. Patient's heart rate has  decreased from 124-96 bpm after fluid administered via IV. Doubt DKA-negative ketones noted in serum and in urine. Glucose decreased to 366. Patient presenting to the ED with elevated creatinine of 2.78-acute renal failure secondary to dehydration. CMP noted hypokalemia, hyponatremia, low chloride, elevated bicarbonate. Negative findings of digoxin toxicity. Negative findings of fluid overload. Chest x-ray unremarkable for negative signs of pneumonia. Patient to be admitted to stepdown under the care [facility]. Patient stable for transfer.  Raymon Mutton, PA-C 05/15/14 1925  Donnetta Hutching, MD 05/16/14 [phone]

## 2014-05-15 NOTE — ED Notes (Signed)
Attempted report 

## 2014-05-16 ENCOUNTER — Encounter (HOSPITAL_COMMUNITY): Payer: Self-pay | Admitting: *Deleted

## 2014-05-16 DIAGNOSIS — I10 Essential (primary) hypertension: Secondary | ICD-10-CM

## 2014-05-16 LAB — BASIC METABOLIC PANEL
ANION GAP: 12 (ref 5–15)
ANION GAP: 9 (ref 5–15)
Anion gap: 12 (ref 5–15)
BUN: 71 mg/dL — ABNORMAL HIGH (ref 6–23)
BUN: 69 mg/dL — ABNORMAL HIGH (ref 6–23)
BUN: 67 mg/dL — ABNORMAL HIGH (ref 6–23)
CALCIUM: 11 mg/dL — AB (ref 8.4–10.5)
CHLORIDE: 90 meq/L — AB (ref 96–112)
CHLORIDE: 92 meq/L — AB (ref 96–112)
CO2: 37 mmol/L — ABNORMAL HIGH (ref 19–32)
CO2: 33 mmol/L — AB (ref 19–32)
CO2: 35 mmol/L — AB (ref 19–32)
Calcium: 11.1 mg/dL — ABNORMAL HIGH (ref 8.4–10.5)
Calcium: 10.9 mg/dL — ABNORMAL HIGH (ref 8.4–10.5)
Chloride: 84 mEq/L — ABNORMAL LOW (ref 96–112)
Creatinine, Ser: 2.51 mg/dL — ABNORMAL HIGH (ref 0.50–1.35)
Creatinine, Ser: 2.41 mg/dL — ABNORMAL HIGH (ref 0.50–1.35)
Creatinine, Ser: 2.46 mg/dL — ABNORMAL HIGH (ref 0.50–1.35)
GFR calc Af Amer: 29 mL/min — ABNORMAL LOW (ref >90)
GFR calc Af Amer: 31 mL/min — ABNORMAL LOW (ref >90)
GFR calc Af Amer: 30 mL/min — ABNORMAL LOW (ref >90)
GFR calc non Af Amer: 25 mL/min — ABNORMAL LOW (ref >90)
GFR calc non Af Amer: 26 mL/min — ABNORMAL LOW (ref >90)
GFR calc non Af Amer: 26 mL/min — ABNORMAL LOW (ref >90)
Glucose, Bld: 301 mg/dL — ABNORMAL HIGH (ref 70–99)
Glucose, Bld: 194 mg/dL — ABNORMAL HIGH (ref 70–99)
Glucose, Bld: 90 mg/dL (ref 70–99)
POTASSIUM: 3.4 mmol/L — AB (ref 3.5–5.1)
Potassium: 4.2 mmol/L (ref 3.5–5.1)
Potassium: 3.6 mmol/L (ref 3.5–5.1)
SODIUM: 131 mmol/L — AB (ref 135–145)
SODIUM: 135 mmol/L (ref 135–145)
SODIUM: 138 mmol/L (ref 135–145)

## 2014-05-16 LAB — GLUCOSE, CAPILLARY
GLUCOSE-CAPILLARY: 281 mg/dL — AB (ref 70–99)
GLUCOSE-CAPILLARY: 186 mg/dL — AB (ref 70–99)
GLUCOSE-CAPILLARY: 174 mg/dL — AB (ref 70–99)
GLUCOSE-CAPILLARY: 95 mg/dL (ref 70–99)
GLUCOSE-CAPILLARY: 183 mg/dL — AB (ref 70–99)
GLUCOSE-CAPILLARY: 73 mg/dL (ref 70–99)
Glucose-Capillary: 170 mg/dL — ABNORMAL HIGH (ref 70–99)
Glucose-Capillary: 358 mg/dL — ABNORMAL HIGH (ref 70–99)
Glucose-Capillary: 367 mg/dL — ABNORMAL HIGH (ref 70–99)
Glucose-Capillary: 139 mg/dL — ABNORMAL HIGH (ref 70–99)
Glucose-Capillary: 169 mg/dL — ABNORMAL HIGH (ref 70–99)
Glucose-Capillary: 231 mg/dL — ABNORMAL HIGH (ref 70–99)
Glucose-Capillary: 73 mg/dL (ref 70–99)
Glucose-Capillary: 134 mg/dL — ABNORMAL HIGH (ref 70–99)
Glucose-Capillary: 158 mg/dL — ABNORMAL HIGH (ref 70–99)

## 2014-05-16 LAB — RAPID URINE DRUG SCREEN, HOSP PERFORMED
Amphetamines: NOT DETECTED
Barbiturates: NOT DETECTED
Benzodiazepines: NOT DETECTED
Cocaine: NOT DETECTED
Opiates: NOT DETECTED
Tetrahydrocannabinol: NOT DETECTED

## 2014-05-16 LAB — HEMOGLOBIN A1C
Hgb A1c MFr Bld: 14.2 % — ABNORMAL HIGH (ref <5.7)
Mean Plasma Glucose: 361 mg/dL — ABNORMAL HIGH (ref <117)

## 2014-05-16 MED ORDER — INSULIN GLARGINE 100 UNIT/ML SOLOSTAR PEN: 25.0000 [IU] | PEN_INJECTOR | Freq: Two times a day (BID) | SUBCUTANEOUS | Status: DC

## 2014-05-16 MED ORDER — INSULIN GLARGINE 100 UNIT/ML ~~LOC~~ SOLN
25.0000 [IU] | Freq: Two times a day (BID) | SUBCUTANEOUS | Status: DC
  Administered 2014-05-16 – 2014-05-17 (×2): 25 [IU] via SUBCUTANEOUS
  Filled 2014-05-16 (×4): qty 0.25

## 2014-05-16 MED ORDER — INSULIN ASPART 100 UNIT/ML ~~LOC~~ SOLN
0.0000 [IU] | Freq: Three times a day (TID) | SUBCUTANEOUS | Status: DC
  Administered 2014-05-16: 1 [IU] via SUBCUTANEOUS
  Administered 2014-05-16: 2 [IU] via SUBCUTANEOUS

## 2014-05-16 MED ORDER — CAMPHOR-MENTHOL 0.5-0.5 % EX LOTN
TOPICAL_LOTION | CUTANEOUS | Status: DC | PRN
  Administered 2014-05-16: 1 via TOPICAL
  Filled 2014-05-16: qty 222

## 2014-05-16 MED ORDER — GLUCERNA PO LIQD
237.0000 mL | Freq: Three times a day (TID) | ORAL | Status: DC
  Administered 2014-05-16 – 2014-05-17 (×4): 237 mL via ORAL
  Filled 2014-05-16 (×8): qty 250

## 2014-05-16 MED ORDER — ENSURE COMPLETE PO LIQD
237.0000 mL | Freq: Two times a day (BID) | ORAL | Status: DC
Start: 2014-05-16 — End: 2014-05-16
  Administered 2014-05-16 (×2): 237 mL via ORAL

## 2014-05-16 MED ORDER — SODIUM CHLORIDE 0.9 % IV SOLN
INTRAVENOUS | Status: DC
  Administered 2014-05-16: 13:00:00 via INTRAVENOUS

## 2014-05-16 NOTE — Progress Notes (Signed)
New Admission Note:  Arrival Method: via stretcher with nurse Tech Mental Orientation: Alert and orientedx4 Telemetry: Placed on tele, CCMD notified Assessment: Completed Skin: Wound on left leg (2.0x0.8), wound on mid back (2.0x2.0), scabs present on whole back, discoloration BLE Pain: Denies any pain Safety Measures: Safety Fall Prevention Plan was given, discussed and signed. Admission: Completed [address] [place] Orientation: Patient has been orientated to the room, unit and the staff. Family: None at bedside  Orders have been reviewed and implemented. Will continue to monitor the patient. Call light has been placed within reach and bed alarm has been activated.   Tempie Donning BSN, RN  Phone Number: [phone] [facility] [address] [place] Med/Surg-Renal Unit

## 2014-05-16 NOTE — Progress Notes (Addendum)
[facility] PROGRESS NOTE Interim History: 68 year old male with chronic systolic CHF status post AICD placement (EF of 20-25%) , coronary artery disease , history of recurrent venous somewhat embolism but not on anticoagulation due to GI bleed (has IVC filter), chronic kidney disease stage III, uncontrolled type 2 diabetes mellitus, chronic anemia severe protein calorie malnutrition, chronic venous insufficiency, low back pain, with recent hospitalization for hyperosmolar nonketotic hyperglycemia and acute on chronic kidney disease .  Filed Weights   05/15/14 1431 05/15/14 2022  Weight: 54.432 kg (120 lb) 60.9 kg (134 lb 4.2 oz)        Intake/Output Summary (Last 24 hours) at 05/16/14 1015 Last data filed at 05/16/14 1005  Gross per 24 hour  Intake 271.25 ml  Output    775 ml  Net -503.75 ml     Assessment/Plan: Hyperosmolar non-ketotic state in patient with type 2 diabetes mellitus/ Uncontrolled type 2 DM with hyperosmolar nonketotic hyperglycemia: - Started on IV insulin on admission, hyperglycemia improved, transition to Lantus home dose plus sliding scale insulin. - CBG seems to be well controlled, she has no leukocytosis is no fever, first set of cardiac enzymes were negative. - Question his compliance at home. - check UDS and  A1c.  Acute on chronic renal failure - Baseline creatinine ranges from 1.4-2.0, multifactorial due to diuretics ACE inhibitor and HONK. - On admission creatinine was 2.7 she started on D5 half-normal saline her creatinine has improved to 2.4. Her diuretics and her Ace inhibitors have been on hold. - Change IV fluids to normal saline. - Check a basic metabolic panel in the morning.  Severe protein-calorie malnutrition: - Glucerna 3 times a day. - PT OT recommended home health PT.  Hypokalemia: - Repleted and resolved due to decreased intravascular volume.  Chronic systolic heart failure: - Patient  still is hypovolemic. No JVD - Continue  to hold Ace inhibitors and diuretics. Continue to monitor strict is and os. - His blood pressure continues to be borderline, continue metoprolol.  Essential hypertension, benign Hypotensive on admission. Bp cont to be borderline, cont hold ACE inhibitor , hydralazine and diuretics.   History of recurrent DVT Status post IVC filter. Not on any anticoagulation due to history of GI bleeding from gastric AVMs  BPH Continue Flomax  Code Status: full Family Communication: brother  Disposition Plan: inpatient   Consultants:  none  Procedures: ECHO: none  Antibiotics:  None  HPI/Subjective: Feels better no complains  Objective: Filed Vitals:   05/15/14 2022 05/16/14 0518 05/16/14 0600 05/16/14 0926  BP: 127/81 83/66 90/60  90/51  Pulse: 114 51  101  Temp: 98.5 F (36.9 C) 98.6 F (37 C)  98 F (36.7 C)  TempSrc:    Oral  Resp: 21 18  18   Height: 6' (1.829 m)     Weight: 60.9 kg (134 lb 4.2 oz)     SpO2: 100% 98%  98%     Exam:  General: Alert, awake, oriented x3, in no acute distress.  HEENT: No bruits, no goiter. -JVD Heart: Regular rate and rhythm.  Lungs: Good air movement, clear  Abdomen: Soft, nontender, nondistended, positive bowel sounds.   Data Reviewed: Basic Metabolic Panel:  Recent Labs Lab 05/15/14 1445 05/15/14 2059 05/16/14 0057 05/16/14 0519 05/16/14 0819  NA 130* 134* 131* 135 138  K 3.3* 4.0 3.4* 4.2 3.6  CL 79* 85* 84* 90* 92*  CO2 35* 32 35* 33* 37*  GLUCOSE 494* 367* 301* 194* 90  BUN 78*  76* 71* 69* 67*  CREATININE 2.78* 2.57* 2.51* 2.41* 2.46*  CALCIUM 12.0* 11.5* 11.1* 10.9* 11.0*   Liver Function Tests:  Recent Labs Lab 05/15/14 1445  AST 27  ALT 31  ALKPHOS 90  BILITOT 1.7*  PROT 7.2  ALBUMIN 3.9    Recent Labs Lab 05/15/14 1445  LIPASE 120*   No results for input(s): AMMONIA in the last 168 hours. CBC:  Recent Labs Lab 05/15/14 1445  WBC 5.5  HGB 15.3  HCT 45.4  MCV 85.3  PLT 115*   Cardiac  Enzymes: No results for input(s): CKTOTAL, CKMB, CKMBINDEX, TROPONINI in the last 168 hours. BNP (last 3 results)  Recent Labs  08/10/13 1650  PROBNP 1181.0*   CBG:  Recent Labs Lab 05/16/14 0337 05/16/14 0434 05/16/14 0543 05/16/14 0652 05/16/14 0753  GLUCAP 139* 169* 186* 231* 174*    No results found for this or any previous visit (from the past 240 hour(s)).   Studies: Dg Chest 2 View  05/15/2014   CLINICAL DATA:  Weakness, body aches  EXAM: CHEST  2 VIEW  COMPARISON:  06/14/2013  FINDINGS: Left lower lobe scarring/ atelectasis. No focal consolidation. No pleural effusion or pneumothorax.  The heart is normal in size.  Left subclavian ICD.  Mild degenerative changes of the visualized thoracolumbar spine.  IMPRESSION: No evidence of acute cardiopulmonary disease.   Electronically Signed   By: Charline BillsSriyesh  Krishnan M.D.   On: 05/15/2014 18:12    Scheduled Meds: . allopurinol  100 mg Oral Daily  . atorvastatin  20 mg Oral [id number]  . digoxin  0.125 mg Oral Daily  . famotidine  20 mg Oral Daily  . feeding supplement (ENSURE COMPLETE)  237 mL Oral BID BM  . GLUCERNA  237 mL Oral TID BM  . heparin  5,000 Units Subcutaneous 3 times per day  . insulin aspart  0-9 Units Subcutaneous TID WC  . insulin glargine  25 Units Subcutaneous BID  . isosorbide mononitrate  30 mg Oral Daily  . metoprolol succinate  12.5 mg Oral Daily  . nateglinide  120 mg Oral TID WC  . pantoprazole  40 mg Oral Daily  . tamsulosin  0.4 mg Oral QPC supper   Continuous Infusions: . sodium chloride    . sodium chloride 0.9 % 1,000 mL with potassium chloride 40 mEq infusion Stopped (05/15/14 0235)     [name], [name]  [facility] Pager [phone] If 8PM-8AM, please contact night-coverage at [email or web address], password [email or web address] 05/16/2014, 10:15 AM  LOS: 1 day

## 2014-05-16 NOTE — Evaluation (Signed)
Physical Therapy Evaluation Patient Details Name: [name] MRN: [id number] DOB: [birth year] Today's Date: 05/16/2014   History of Present Illness  Pt is a 68 y/o male brought to the ED by nursing aide for dehydration and poor po intake.   Clinical Impression  Pt admitted with above diagnosis. Pt currently with functional limitations due to the deficits listed below (see PT Problem List). At the time of PT eval pt was able to perform transfers and ambulation with close guard for safety and RW for stability. Pt limited during session due to low vision - states he cannot find his glasses.   Pt will benefit from skilled PT to increase their independence and safety with mobility to allow discharge to the venue listed below. Would like to progress with gait training prior to d/c - feel that at this time 24 hour assist would be appropriate due to increased risk of falls. Pt states he fell 4 times last week alone. If pt cannot arrange 24 hour assist may want to look at a higher level of care - possibly ALF.      Follow Up Recommendations Home health PT;Supervision/Assistance - 24 hour    Equipment Recommendations  None recommended by PT    Recommendations for Other Services       Precautions / Restrictions Precautions Precautions: Fall Restrictions Weight Bearing Restrictions: No      Mobility  Bed Mobility Overal bed mobility: Needs Assistance Bed Mobility: Supine to Sit     Supine to sit: Supervision     General bed mobility comments: Pt was able to transition to EOB with no physical assist. Increased time required to achieve full sitting - states his back is stiff and hurting.   Transfers Overall transfer level: Needs assistance Equipment used: Rolling walker (2 wheeled) Transfers: Sit to/from Stand Sit to Stand: Min guard         General transfer comment: Pt able to power-up to full standing with no physical assistance, however hands-on guarding utilized as pt was  unsteady.   Ambulation/Gait Ambulation/Gait assistance: Min guard Ambulation Distance (Feet): 100 Feet Assistive device: Rolling walker (2 wheeled) Gait Pattern/deviations: Step-through pattern;Decreased stride length;Trunk flexed;Shuffle Gait velocity: Decreased Gait velocity interpretation: Below normal speed for age/gender General Gait Details: Pt was able to ambulate with RW and hands-on guarding for occasional unsteadiness. No real assist required as pt was able to recover without LOB. Pt with difficulty due to low vision, and states he cannot find his glasses at this time.   Stairs            Wheelchair Mobility    Modified Rankin (Stroke Patients Only)       Balance Overall balance assessment: Needs assistance Sitting-balance support: Feet supported;No upper extremity supported Sitting balance-Leahy Scale: Good     Standing balance support: No upper extremity supported Standing balance-Leahy Scale: Fair                               Pertinent Vitals/Pain Pain Assessment: Faces Faces Pain Scale: Hurts little more Pain Location: Back when initially sitting up. After begins walking, states that he feels his back loosened up.  Pain Intervention(s): Limited activity within patient's tolerance;Monitored during session;Repositioned    Home Living Family/patient expects to be discharged to:: Private residence Living Arrangements: Alone Available Help at Discharge: Personal care attendant;Available PRN/intermittently Type of Home: House Home Access: Stairs to enter   [employer] of Steps:  3 Home Layout: One level Home Equipment: Walker - 2 wheels;Cane - single point;Shower seat      Prior Function Level of Independence: Needs assistance   Gait / Transfers Assistance Needed: Pt states that he ambulated with a walker or a cane depending on what he felt like he needed for that day.  ADL's / Homemaking Assistance Needed: Pt states his aide  assists with ADL's, housekeeping, and cooking.         Hand Dominance   Dominant Hand: Right    Extremity/Trunk Assessment   Upper Extremity Assessment: Defer to OT evaluation           Lower Extremity Assessment: Generalized weakness      Cervical / Trunk Assessment: Normal  Communication   Communication: No difficulties  Cognition Arousal/Alertness: Awake/alert Behavior During Therapy: WFL for tasks assessed/performed Overall Cognitive Status: No family/caregiver present to determine baseline cognitive functioning                      General Comments      Exercises        Assessment/Plan    PT Assessment Patient needs continued PT services  PT Diagnosis Difficulty walking;Generalized weakness   PT Problem List Decreased strength;Decreased range of motion;Decreased activity tolerance;Decreased balance;Decreased mobility;Decreased knowledge of use of DME;Decreased safety awareness;Decreased knowledge of precautions  PT Treatment Interventions DME instruction;Gait training;Stair training;Functional mobility training;Therapeutic activities;Therapeutic exercise;Neuromuscular re-education;Patient/family education   PT Goals (Current goals can be found in the Care Plan section) Acute Rehab PT Goals Patient Stated Goal: Return home as soon as possible. PT Goal Formulation: With patient Time For Goal Achievement: 05/23/14 Potential to Achieve Goals: Good    Frequency Min 3X/week   Barriers to discharge Decreased caregiver support Pt lives alone    Co-evaluation               End of Session Equipment Utilized During Treatment: Gait belt Activity Tolerance: Patient tolerated treatment well Patient left: in chair;with call bell/phone within reach;with nursing/sitter in room Nurse Communication: Mobility status         Time: [id number] PT Time Calculation (min) (ACUTE ONLY): 19 min   Charges:   PT Evaluation $Initial PT Evaluation Tier I: 1  Procedure PT Treatments $Gait Training: 8-22 mins   PT G Codes:        Conni SlipperKirkman, Stacey Maura 05/16/2014, 9:37 AM   Conni SlipperLaura Ayaansh Smail, PT, DPT Acute Rehabilitation Services Pager: [phone]

## 2014-05-16 NOTE — Progress Notes (Signed)
INITIAL NUTRITION ASSESSMENT  Pt meets criteria for SEVERE MALNUTRITION in the context of chronic illness as evidenced by a 10/6% weight loss in 2 months and severe fat and muscle mass loss.  DOCUMENTATION CODES Per approved criteria  -Severe malnutrition in the context of acute illness or injury -Underweight   INTERVENTION: Discontinue Ensure Complete.  Provide Glucerna Shake po TID, each supplement provides 220 kcal and 10 grams of protein.  Provide nourishment snacks (turkey/ham sandwich). Ordered.  Encourage adequate PO intake.  NUTRITION DIAGNOSIS: Increased nutrient needs related to chronic illness as evidenced by estimated nutrition needs.   Goal: Pt to meet >/= 90% of their estimated nutrition needs   Monitor:  PO intake, weight trends, labs, I/O's  Reason for Assessment: MST  68 y.o. male  Admitting Dx: Hyperosmolar non-ketotic state in patient with type 2 diabetes mellitus  ASSESSMENT: Pt with chronic systolic CHF status post AICD placement (EF of 20-25%) , coronary artery disease, chronic kidney disease stage III, uncontrolled type 2 diabetes mellitus, chronic anemia severe protein calorie malnutrition, chronic venous insufficiency was brought to the hospital by EMS after his nursing aide found that patient had poor appetite and generalized weakness.  Pt reports having a good appetite currently and PTA at home eating 3 meal a day and drinking Glucerna occasionally. Meal completion has been 85%. Per Epic weight records, pt with a 10.6% weight loss in 2 months. Pt has Ensure ordered and is agreeable to switching to Glucerna. Will discontinue Ensure. Pt reports he would also like snacks ordered between his meals (turkey/ham sandwiches). RD to order. Pt was encouraged to eat his food at meals and to drink his supplements.   Nutrition Focused Physical Exam:  Subcutaneous Fat:  Orbital Region: N/A Upper Arm Region: Severe depletion Thoracic and Lumbar Region: Severe  depletion  Muscle:  Temple Region: Severe depletion Clavicle Bone Region: Severe depletion Clavicle and Acromion Bone Region: Severe depletion Scapular Bone Region: Severe depletion Dorsal Hand: N/A Patellar Region: Severe depletion Anterior Thigh Region: Severe depletion Posterior Calf Region: Severe depletion  Edema: none  Labs: Low chloride and GFR. High CO2, BUN, creatinine, and calcium.  Height: Ht Readings from Last 1 Encounters:  05/15/14 6' (1.829 m)    Weight: Wt Readings from Last 1 Encounters:  05/15/14 134 lb 4.2 oz (60.9 kg)    Ideal Body Weight: 178 lbs  % Ideal Body Weight: 75%  Wt Readings from Last 10 Encounters:  05/15/14 134 lb 4.2 oz (60.9 kg)  04/27/14 125 lb 1.6 oz (56.745 kg)  02/25/14 150 lb (68.04 kg)  02/06/14 140 lb (63.504 kg)  10/17/13 150 lb 6 oz (68.21 kg)  09/28/13 164 lb (74.39 kg)  07/18/13 164 lb 4 oz (74.503 kg)  06/14/13 178 lb 2.1 oz (80.8 kg)  05/31/13 168 lb (76.204 kg)  05/09/13 177 lb 8 oz (80.513 kg)    Usual Body Weight: 164 lbs  % Usual Body Weight: 82%  BMI:  Body mass index is 18.2 kg/(m^2). Underweight  Estimated Nutritional Needs: Kcal: 1900-2100 Protein: 90-100 grams Fluid: 1.9 - 2.1 L/day  Skin: wound on back, left leg  Diet Order: Diet Carb Modified  EDUCATION NEEDS: -No education needs identified at this time   Intake/Output Summary (Last 24 hours) at 05/16/14 1026 Last data filed at 05/16/14 1005  Gross per 24 hour  Intake 271.25 ml  Output    775 ml  Net -503.75 ml    Last BM: PTA  Labs:   Recent Labs  Lab 05/16/14 0057 05/16/14 0519 05/16/14 0819  NA 131* 135 138  K 3.4* 4.2 3.6  CL 84* 90* 92*  CO2 35* 33* 37*  BUN 71* 69* 67*  CREATININE 2.51* 2.41* 2.46*  CALCIUM 11.1* 10.9* 11.0*  GLUCOSE 301* 194* 90    CBG (last 3)   Recent Labs  05/16/14 0543 05/16/14 0652 05/16/14 0753  GLUCAP 186* 231* 174*    Scheduled Meds: . allopurinol  100 mg Oral Daily  .  atorvastatin  20 mg Oral BH-q7a  . digoxin  0.125 mg Oral Daily  . famotidine  20 mg Oral Daily  . feeding supplement (ENSURE COMPLETE)  237 mL Oral BID BM  . GLUCERNA  237 mL Oral TID BM  . heparin  5,000 Units Subcutaneous 3 times per day  . insulin aspart  0-9 Units Subcutaneous TID WC  . insulin glargine  25 Units Subcutaneous BID  . isosorbide mononitrate  30 mg Oral Daily  . metoprolol succinate  12.5 mg Oral Daily  . nateglinide  120 mg Oral TID WC  . pantoprazole  40 mg Oral Daily  . tamsulosin  0.4 mg Oral QPC supper    Continuous Infusions: . sodium chloride    . sodium chloride 0.9 % 1,000 mL with potassium chloride 40 mEq infusion Stopped (05/15/14 0235)    Past Medical History  Diagnosis Date  . Venous thromboembolism     DVTs in 2004, 2005, 2007, 8/10, 8/11. IVC filter 2005. Off coumadin 04/2011 secondary to rectal bleeding  . 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 Chronic venous insufficiency   . DM2 (diabetes mellitus, type 2)   . GERD (gastroesophageal reflux disease)   . BPH (benign prostatic hypertrophy)   . Low back pain   . HTN (hypertension)   . HLD (hyperlipidemia)   . NICM (nonischemic cardiomyopathy) 1. 9/11  2. 2/12    1. Echo septal and apical akinesis, dilated LV, EF 45%, RV normal size and systolic function, EF 43% on Myoview  2. Admitted with decompensated CHF, echo EF 30-35% with diffuse hypokinesis but akinesis of mid to apical anteroseptal wall and apex, moderate LVH, mild MR, grade I diastolic dysfunction, SPEP/UP negative and HIV negative. Most recent EF 10-15% 04/2011  . Smoking   . CAD (coronary artery disease) 1. 2007  2. 10/11    1. Left heart cath with 40-50% stenosis in small RCA EF 50%  2. Lexiscan myoview EF 43%, global hypokinesis, possible small area of apical ischemia; LHC no angiographic CAD, no LV-gram done due to CKD   2. No angiographic CAD by cath 02/2010  . CKD (chronic kidney disease) stage 2, GFR  60-89 ml/min as of 02/2011  . Headache(784.0)   . Diabetes mellitus   . Blood transfusion   . Anemia   . Syncope     1. in setting of presumed orthostatic Hypotn 09/2011  . Right fibular fracture     1. 09/2011 - resulting from fall/syncope  . Closed right ankle fracture 10/10/2011    Past Surgical History  Procedure Laterality Date  . Cardiac catheterization    . Appendectomy    . Cholecystectomy    . Colonoscopy  05/01/2011    Procedure: COLONOSCOPY;  Surgeon: Louis Meckelobert D Kaplan, MD;  [facility];  Service: Endoscopy;  Laterality: N/A;  . Orif ankle fracture  10/10/2011    Procedure: OPEN REDUCTION INTERNAL FIXATION (ORIF) ANKLE FRACTURE;  Surgeon: Burtis JunesJoshua P  Dion Saucier, MD;  Location: [facility] OR;  Service: Orthopedics;  Laterality: Right;  Right  Ankle Fracture ORIF  . Right heart catheterization N/A 06/25/2011    Procedure: RIGHT HEART CATH;  Surgeon: Dolores Patty, MD;  Location: [facility] CATH LAB;  Service: Cardiovascular;  Laterality: N/A;  . Implantable cardioverter defibrillator implant N/A 06/13/2013    Procedure: IMPLANTABLE CARDIOVERTER DEFIBRILLATOR IMPLANT;  Surgeon: Marinus Maw, MD;  Location: [facility] CATH LAB;  Service: Cardiovascular;  Laterality: N/A;    Marijean Niemann, MS, RD, LDN Pager # [phone] After hours/ weekend pager # [phone]

## 2014-05-17 LAB — BASIC METABOLIC PANEL
Anion gap: 7 (ref 5–15)
BUN: 50 mg/dL — ABNORMAL HIGH (ref 6–23)
CALCIUM: 9.5 mg/dL (ref 8.4–10.5)
CO2: 34 mmol/L — ABNORMAL HIGH (ref 19–32)
Chloride: 96 mEq/L (ref 96–112)
Creatinine, Ser: 2.06 mg/dL — ABNORMAL HIGH (ref 0.50–1.35)
GFR calc Af Amer: 37 mL/min — ABNORMAL LOW (ref >90)
GFR calc non Af Amer: 32 mL/min — ABNORMAL LOW (ref >90)
GLUCOSE: 97 mg/dL (ref 70–99)
Potassium: 4.3 mmol/L (ref 3.5–5.1)
Sodium: 137 mmol/L (ref 135–145)

## 2014-05-17 LAB — GLUCOSE, CAPILLARY: Glucose-Capillary: 116 mg/dL — ABNORMAL HIGH (ref 70–99)

## 2014-05-17 MED ORDER — COLLAGENASE 250 UNIT/GM EX OINT
TOPICAL_OINTMENT | Freq: Every day | CUTANEOUS | Status: DC
  Filled 2014-05-17: qty 30

## 2014-05-17 NOTE — Consult Note (Signed)
WOC wound consult note Reason for Consult: evaluation of lower back wounds. Pt known to this WOC from previous admission 04/27/14 at which time I evaluated these same areas.  Pt also has non healing ulcer on the LLE in the pretibial region with no significant edema, no history of trauma that patient can recall, and weak but palpable pulses. Pt also presents with same area on the thoracic spine.  He reports he is somewhat ambulatory and moves himself in the chair, when I last saw him it did not appear to be as much pressure as it does today. He appears to me to have lost more weight and have a bit more prominence of the kyphosis.   Wound type: non healing lower extremity ulcer, unclear etiology;  Stage II Pressure Ulcer thoracic spine  Pressure Ulcer POA: Yes  Measurement: LLE: 1.0cm x 0.5cm x 0.2cm  Spinal: 1.0cm x 0.7cm x 0.1cm  Wound bed: LLE: necrotic tissue 100% yellow and some black noted  Spinal: clean, non healing, does not appear infected Drainage (amount, consistency, odor)  LLE: none Spinal: Minimal serous Periwound: intact Dressing procedure/placement/frequency: Enzymatic debridement ointment to clean up the wound base of the LLE wound. The wound on the thoracic spine is chronic in nature also greater than 1 year , needs follow up with wound care center and/or dermatology due to chronicity of these wounds and unclear etiology on the LLE.   Discussed POC with patient and bedside nurse.  Re consult if needed, will not follow at this time. Thanks  Navjot Loera [employer], CWOCN [phone])

## 2014-05-17 NOTE — Progress Notes (Signed)
[name] to be D/C'd Home per MD order.  Discussed prescriptions and follow up appointments with the patient. Prescriptions given to patient, medication list explained in detail. Pt verbalized understanding.    Medication List    TAKE these medications        allopurinol 100 MG tablet  Commonly known as:  ZYLOPRIM  TAKE 1 TABLET BY MOUTH DAILY     atorvastatin 20 MG tablet  Commonly known as:  LIPITOR  Take 20 mg by mouth every morning.     digoxin 0.125 MG tablet  Commonly known as:  LANOXIN  Take 0.125 mg by mouth daily.     eplerenone 25 MG tablet  Commonly known as:  INSPRA  Take 25 mg by mouth daily.     esomeprazole 40 MG capsule  Commonly known as:  NEXIUM  Take 40 mg by mouth daily at 12 noon.     famotidine 20 MG tablet  Commonly known as:  PEPCID  Take 20 mg by mouth daily.     hydrALAZINE 10 MG tablet  Commonly known as:  APRESOLINE  Take 10 mg by mouth 3 (three) times daily.     HYDROcodone-acetaminophen 5-325 MG per tablet  Commonly known as:  NORCO/VICODIN  Take 1 tablet by mouth every 6 (six) hours as needed.     Insulin Glargine 100 UNIT/ML Solostar Pen  Commonly known as:  LANTUS SOLOSTAR  Inject 25 Units into the skin 2 (two) times daily.     isosorbide mononitrate 30 MG 24 hr tablet  Commonly known as:  IMDUR  Take 30 mg by mouth daily.     lisinopril 5 MG tablet  Commonly known as:  PRINIVIL,ZESTRIL  Take 5 mg by mouth every evening.     metolazone 2.5 MG tablet  Commonly known as:  ZAROXOLYN  Take 2.5 mg by mouth daily as needed (for increased fluid.).     metoprolol succinate 25 MG 24 hr tablet  Commonly known as:  TOPROL-XL  Take 12.5 mg by mouth daily.     nateglinide 60 MG tablet  Commonly known as:  STARLIX  Take 120 mg by mouth 3 (three) times daily.     tamsulosin 0.4 MG Caps capsule  Commonly known as:  FLOMAX  Take 0.4 mg by mouth daily after supper.     torsemide 20 MG tablet  Commonly known as:  DEMADEX  Take  20 mg by mouth every evening.        Filed Vitals:   05/17/14 1000  BP: 109/78  Pulse: 102  Temp: 98.7 F (37.1 C)  Resp: 18    Skin clean, dry and intact without evidence of skin break down, no evidence of skin tears noted. IV catheter discontinued intact. Site without signs and symptoms of complications. Dressing and pressure applied. Pt denies pain at this time. No complaints noted.  An After Visit Summary was printed and given to the patient. Patient escorted via WC, and D/C home via private auto.  HILL, Alex Mcmanigal 05/17/2014 11:52 AM

## 2014-05-17 NOTE — Discharge Summary (Signed)
Physician Discharge Summary  [name] JXB:[id number] DOB: 16-Sep-1945 DOA: 05/15/2014  PCP: Jackie Plum, MD  Admit date: 05/15/2014 Discharge date: 05/17/2014  Time spent:  Recommendations for Outpatient Follow-up:  1. Follow-up with primary care doctor in 2-4 weeks. 2. Follow-up and daily weights and CBGs. Hemoglobin A1c was 14.2  Discharge Diagnoses:  Principal Problem:   Hyperosmolar non-ketotic state in patient with type 2 diabetes mellitus Active Problems:   Acute on chronic renal failure   Essential hypertension, benign   CORONARY ATHEROSCLEROSIS NATIVE CORONARY ARTERY   CHF (congestive heart failure)   Uncontrolled type 2 DM with hyperosmolar nonketotic hyperglycemia   Hypokalemia   Severe protein-calorie malnutrition   Dehydration   Discharge Condition: Stable  Diet recommendation: Carb modified  Filed Weights   05/15/14 1431 05/15/14 2022  Weight: 54.432 kg (120 lb) 60.9 kg (134 lb 4.2 oz)    History of present illness:  68 year old male with chronic systolic CHF status post AICD placement (EF of 20-25%) , coronary artery disease , history of recurrent venous somewhat embolism but not on anticoagulation due to GI bleed (has IVC filter), chronic kidney disease stage III, uncontrolled type 2 diabetes mellitus, chronic anemia severe protein calorie malnutrition, chronic venous insufficiency, low back pain, with recent hospitalization for hyperosmolar nonketotic hyperglycemia and acute on chronic kidney disease was brought to the hospital by EMS after his nursing aide found that patient had poor appetite and generalized weakness. On questioning patient reports that his aide called the EMS for no clear reason and he does not know why he is brought to the hospital. Patient reports that he has had poor appetite for the past 2-3 days and has not been using his insulin since then. He reports his fingersticks to be in 300s and 400s. He reports taking his  medications by himself and lives alone. Patient denies headache, dizziness, fever, chills, nausea , vomiting, chest pain, palpitations, SOB, abdominal pain, bowel or urinary symptoms. Denies any sick contact. Reports poor appetite. Denies any weakness. Patient's caregiver was not present at bedside.  Hospital Course:  Hyperosmolar non-ketotic state in patient with type 2 diabetes mellitus/ Uncontrolled type 2 DM with hyperosmolar nonketotic hyperglycemia: - Due to noncompliance his hemoglobin A1c was 14 on admission - Started on IV insulin on admission, hyperglycemia improved, transition to Lantus home dose plus sliding scale insulin. - CBG seems to be well controlled, she has no leukocytosis is no fever, first set of cardiac enzymes were negative. - Has been well controlled in the hospital Lantus 25 units daily plus sliding scale insulin.  Acute on chronic renal failure - Baseline creatinine ranges from 1.4-2.0, multifactorial due to diuretics ACE inhibitor and HONK. - On admission creatinine was 2.7 she started on D5 half-normal saline her creatinine has improved. - His diuretics and her Ace inhibitors have been on hold. - Restart ace inhibitor outpatient.  Severe protein-calorie malnutrition: - Glucerna 3 times a day. - PT OT recommended home health PT.  Hypokalemia: - Repleted and resolved due to decreased intravascular volume.  Chronic systolic heart failure: - Patient still is hypovolemic. No JVD - Held Ace inhibitors and diuretics on admission.  Resume as an outpatient.  Essential hypertension, benign Hypotensive on admission. Held  ACE inhibitor , hydralazine and diuretics.  History of recurrent DVT Status post IVC filter. 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     digoxin (LANOXIN) 0.125 MG tablet Take 0.125 mg by mouth daily.    eplerenone (INSPRA) 25 MG tablet Take 25 mg by mouth daily.    esomeprazole (NEXIUM) 40 MG capsule Take 40 mg by mouth daily at 12 noon.    famotidine (PEPCID) 20 MG tablet Take 20 mg by mouth daily.    hydrALAZINE (APRESOLINE) 10 MG tablet Take 10 mg by mouth 3 (three) times daily.     Insulin Glargine (LANTUS SOLOSTAR) 100 UNIT/ML Solostar Pen Inject 25 Units into the skin 2 (two) times daily.    isosorbide mononitrate (IMDUR) 30 MG 24 hr tablet Take 30 mg by mouth daily.    lisinopril (PRINIVIL,ZESTRIL) 5 MG tablet Take 5 mg by mouth every evening.    metolazone (ZAROXOLYN) 2.5 MG tablet Take 2.5 mg by mouth daily as needed (for increased fluid.).    metoprolol succinate (TOPROL-XL) 25 MG 24 hr tablet Take 12.5 mg by mouth daily.     nateglinide (STARLIX) 60 MG tablet Take 120 mg by mouth 3 (three) times daily.     tamsulosin (FLOMAX) 0.4 MG CAPS capsule Take 0.4 mg by mouth daily after supper.    torsemide (DEMADEX) 20 MG tablet Take 20 mg by mouth every evening.    allopurinol (ZYLOPRIM) 100 MG tablet TAKE 1 TABLET BY MOUTH DAILY Qty: 90 tablet, Refills: 3    HYDROcodone-acetaminophen (NORCO/VICODIN) 5-325 MG per tablet Take 1 tablet by mouth every 6 (six) hours as needed. Qty: 10 tablet, Refills: 0       No Known Allergies Follow-up Information    Follow up with OSEI-BONSU,GEORGE, MD In 2 weeks.   Specialty:  Internal Medicine   Why:  hospital follow up    Contact information:   [address] [place] [address] [phone]        The results of significant diagnostics from this hospitalization (including imaging, microbiology, ancillary and laboratory) are listed below for reference.    Significant Diagnostic Studies: Dg Chest 2 View  05/15/2014   CLINICAL DATA:  Weakness, body aches  EXAM: CHEST  2 VIEW  COMPARISON:  06/14/2013  FINDINGS: Left lower lobe scarring/ atelectasis. No focal consolidation. No pleural effusion or pneumothorax.  The heart is normal in size.  Left subclavian ICD.  Mild degenerative changes of the visualized thoracolumbar spine.  IMPRESSION: No evidence of acute cardiopulmonary disease.   Electronically Signed   By: Charline Bills M.D.   On: 05/15/2014 18:12    Microbiology: No results found for this or any previous visit (from the past 240 hour(s)).   Labs: Basic Metabolic Panel:  Recent Labs Lab 05/15/14 2059 05/16/14 0057 05/16/14 0519 05/16/14 0819 05/17/14 0620  NA 134* 131* 135 138 137  K 4.0 3.4* 4.2 3.6 4.3  CL 85* 84* 90* 92* 96  CO2 32 35* 33* 37* 34*  GLUCOSE 367* 301* 194* 90 97  BUN 76* 71* 69* 67* 50*  CREATININE 2.57* 2.51* 2.41* 2.46* 2.06*  CALCIUM 11.5* 11.1* 10.9* 11.0* 9.5   Liver Function Tests:  Recent Labs Lab 05/15/14 1445  AST 27  ALT 31  ALKPHOS 90  BILITOT 1.7*  PROT 7.2  ALBUMIN 3.9    Recent Labs Lab 05/15/14 1445  LIPASE 120*   No results for input(s): AMMONIA in the last 168 hours. CBC:  Recent Labs Lab 05/15/14 1445  WBC 5.5  HGB 15.3  HCT 45.4  MCV 85.3  PLT 115*   Cardiac Enzymes: No results for input(s): CKTOTAL, CKMB, CKMBINDEX, TROPONINI in the last 168 hours. BNP: BNP (last 3 results)  Recent Labs  08/10/13 1650  PROBNP 1181.0*   CBG:  Recent Labs Lab 05/16/14 1120 05/16/14 1253 05/16/14 1636 05/16/14 2121 05/17/14 0834  GLUCAP 73 134* 158* 73 116*       Signed:  FELIZ ORTIZ, Karliah Kowalchuk  [facility] 05/17/2014, 9:04 AM

## 2014-05-17 NOTE — Care Management Note (Signed)
CARE MANAGEMENT NOTE 05/17/2014  Patient:  [name], [name]   Account Number:  [id number]  Date Initiated:  05/16/2014  Documentation initiated by:  Eion Timbrook  Subjective/Objective Assessment:   CM following for progression and d/c planning.     Action/Plan:   05/16/2014 Pt managing DM poorly and clearly not able to manage withour further  skilled assistance. [place] aide is unable to manage this for the pt , will discuss SNF for management.  Pt will d/c to home [place] arranged.   Anticipated DC Date:  05/17/2014   Anticipated DC Plan:  [place]         [facility] Choice  HOME HEALTH   Choice offered to / List presented to:  C-1 Patient        [place] arranged  HH-1 RN  [place].   Status of service:  Completed, signed off Medicare Important Message given?  YES (If response is "NO", the following Medicare IM given date fields will be blank) Date Medicare IM given:  05/17/2014 Medicare IM given by:  Adriella Essex Date Additional Medicare IM given:   Additional Medicare IM given by:    Discharge Disposition:    Per UR Regulation:    If discussed at Long Length of Stay Meetings, dates discussed:    Comments:  05/17/2014 Met with pt who does not believe that he has a problem managing his DM, is still upset with his [facility] aide for calling 911. Did agree to [facility] for disease management however this CM is unsure that the pt will be receptive to [place], he states that the [place] students check on him and he will contact the agency who provides the [place] [facility]) aide.  CRoyal RN MPH, case manager, [phone]

## 2014-05-30 ENCOUNTER — Telehealth (HOSPITAL_COMMUNITY): Payer: Self-pay

## 2014-05-30 NOTE — Telephone Encounter (Signed)
Nurse Herbert Seta with [facility] called concerned about patient.  Since discharge, he has only been taking TWO medications on his list, Flomax and Torsemide.  Patient has 16 active medications per the list we have on file.  BP is 100/80, HR regular rate and rhythm at 50 beats per minute.  Has several BP/HR medications on file and unsure whether to discontinue, hold, or restart certain medications.  Instructed to continue taking Torsemide and hold all other BP/HR related medications until he can be seen by our providers this coming Monday at 3pm.  Enforced the importance of bringing ALL medications with him to verify what he is/is not taking.  Patient aware and agreeable, RN voices understanding and will try to help hold patient accountable to this appointment and bringing medications.  Will follow him closely to monitor vitals and weight until he is seen in our clinic.  Ave Filter

## 2014-06-05 ENCOUNTER — Encounter (HOSPITAL_COMMUNITY): Payer: Self-pay

## 2014-06-05 ENCOUNTER — Ambulatory Visit (HOSPITAL_COMMUNITY)
Admission: RE | Admit: 2014-06-05 | Discharge: 2014-06-05 | Disposition: A | Discharge status: Home / Self Care | Payer: Medicare HMO | Source: Ambulatory Visit | Attending: Internal Medicine | Admitting: Internal Medicine

## 2014-06-05 VITALS — BP 94/68 | HR 97 | Wt 128.0 lb

## 2014-06-05 DIAGNOSIS — Z95810 Presence of automatic (implantable) cardiac defibrillator: Secondary | ICD-10-CM | POA: Diagnosis not present

## 2014-06-05 DIAGNOSIS — Z09 Encounter for follow-up examination after completed treatment for conditions other than malignant neoplasm: Secondary | ICD-10-CM | POA: Diagnosis not present

## 2014-06-05 DIAGNOSIS — E43 Unspecified severe protein-calorie malnutrition: Secondary | ICD-10-CM | POA: Diagnosis not present

## 2014-06-05 DIAGNOSIS — E860 Dehydration: Secondary | ICD-10-CM | POA: Diagnosis not present

## 2014-06-05 DIAGNOSIS — R634 Abnormal weight loss: Secondary | ICD-10-CM

## 2014-06-05 DIAGNOSIS — I5022 Chronic systolic (congestive) heart failure: Secondary | ICD-10-CM

## 2014-06-05 LAB — BASIC METABOLIC PANEL
ANION GAP: 11 (ref 5–15)
BUN: 17 mg/dL (ref 6–23)
CO2: 24 mmol/L (ref 19–32)
Calcium: 9.2 mg/dL (ref 8.4–10.5)
Chloride: 104 mEq/L (ref 96–112)
Creatinine, Ser: 1.39 mg/dL — ABNORMAL HIGH (ref 0.50–1.35)
GFR calc Af Amer: 59 mL/min — ABNORMAL LOW (ref >90)
GFR calc non Af Amer: 51 mL/min — ABNORMAL LOW (ref >90)
GLUCOSE: 215 mg/dL — AB (ref 70–99)
Potassium: 3.7 mmol/L (ref 3.5–5.1)
Sodium: 139 mmol/L (ref 135–145)

## 2014-06-05 LAB — CBC
HCT: 44.1 % (ref 39.0–52.0)
Hemoglobin: 15 g/dL (ref 13.0–17.0)
MCH: 29.9 pg (ref 26.0–34.0)
MCHC: 34 g/dL (ref 30.0–36.0)
MCV: 87.8 fL (ref 78.0–100.0)
PLATELETS: 123 10*3/uL — AB (ref 150–400)
RBC: 5.02 MIL/uL (ref 4.22–5.81)
RDW: 13.2 % (ref 11.5–15.5)
WBC: 6.1 10*3/uL (ref 4.0–10.5)

## 2014-06-05 LAB — LACTATE DEHYDROGENASE: LDH: 264 U/L — AB (ref 94–250)

## 2014-06-05 NOTE — Progress Notes (Signed)
Patient ID: [name], male   DOB: September 07, 1945, 69 y.o.   MRN: [id number]  PCP: Dr. Jeanelle Malling Osei-Bonsu Urologist: Dr Retta Diones EP: Dr. Ladona Ridgel  HPI:  Mr. Zimmerli is a 69 year old male with chronic systolic CHF secondary to NICM echo EF 30-35%, HTN, anemia, chronic renal insufficiency, UGI bleed, duodenal AVMs,  rectal bleeding 04/2011,  DVTs (2004, 2005, 2007, 2010, 2011) however coumadin on hold due to GI bleed, IVC filter 2005, GERD, BPD, Urinary Retention, and chronic low back.  09/2011 S/P ORIF L ankle. S/P ICD January 2015.  Spironolactone switched to eplerenone due to gynecomastia.  Admitted from HF clinic 05/30/2012 with volume overload. Diuresed with Milrinone and Lasix. Diuresed 41 pounds. Milrinone and lasix gtt weaned off. CO-OX dropped 43. Beta blocker was stopped due to low output. He was offered home Milrinone however he declined.  Discharge weight 175 pounds.  Admitted to [facility] 12/28 through 12/30 with uncontrolled DM due to noncompliance. Discharge weight  134 pounds.   Follow up for Heart Failure: Overall feeling ok. Denies SOB/PND/Orthopnea. Denies dizziness. Now taking all medications. Weight at home 124-125 pounds. Has also been taking metolazone daily.  Appetite fair.  [facility] following. Living at home alone.   RHC 2/13 RA = 4  RV = 32/6 (7)  PA = 45/15 (24)  PCW = 7  Fick cardiac output/index = 5.2/2.7  Thermo = 4.0/2.1  PVR = 3.4 [place]  FA sat = 94%  PA sat = 56%, 57%  Noninvasive BP : 125/92 (72)  Calculated SVR = 1040  08/13/11 CPX  RER 1.16 Peak VO2 18.6 ml/kg/min Predicted 61.4% VE/VCO2 slope 42.8 08/23/2011 ECHO EF 30-35% 05/09/13 ECHO EF 20-25% RV mod-severely hypokinetic  Labs 02/23/13 K 3.9 Creatinine 1.58 Dig level 0.9  Labs 05/31/13 K 4.0 Creatinine  1.6   SH: Lives alone, quit smoking about a month ago (08/2013), no ETOH or drugs FH: Mother deceased: stroke, HTN,         Father deceased: no issues, died from intracranial bleed from fall        4  sisters and 3 brothers: no CAD   ROS: All systems negative except as listed in HPI, PMH and Problem List.  Past Medical History  Diagnosis Date  . Venous thromboembolism     DVTs in 2004, 2005, 2007, 8/10, 8/11. IVC filter 2005. Off coumadin 04/2011 secondary to rectal bleeding  . 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 Chronic venous insufficiency   . DM2 (diabetes mellitus, type 2)   . GERD (gastroesophageal reflux disease)   . BPH (benign prostatic hypertrophy)   . Low back pain   . HTN (hypertension)   . HLD (hyperlipidemia)   . NICM (nonischemic cardiomyopathy) 1. 9/11  2. 2/12    1. Echo septal and apical akinesis, dilated LV, EF 45%, RV normal size and systolic function, EF 43% on Myoview  2. Admitted with decompensated CHF, echo EF 30-35% with diffuse hypokinesis but akinesis of mid to apical anteroseptal wall and apex, moderate LVH, mild MR, grade I diastolic dysfunction, SPEP/UP negative and HIV negative. Most recent EF 10-15% 04/2011  . Smoking   . CAD (coronary artery disease) 1. 2007  2. 10/11    1. Left heart cath with 40-50% stenosis in small RCA EF 50%  2. Lexiscan myoview EF 43%, global hypokinesis, possible small area of apical ischemia; LHC no angiographic CAD, no LV-gram done due to CKD  2. No angiographic CAD by cath 02/2010  . CKD (chronic kidney disease) stage 2, GFR 60-89 ml/min as of 02/2011  . Headache(784.0)   . Diabetes mellitus   . Blood transfusion   . Anemia   . Syncope     1. in setting of presumed orthostatic Hypotn 09/2011  . Right fibular fracture     1. 09/2011 - resulting from fall/syncope  . Closed right ankle fracture 10/10/2011    Current Outpatient Prescriptions  Medication Sig Dispense Refill  . allopurinol (ZYLOPRIM) 100 MG tablet TAKE 1 TABLET BY MOUTH DAILY 90 tablet 3  . atorvastatin (LIPITOR) 20 MG tablet Take 20 mg by mouth every morning.     . digoxin (LANOXIN) 0.125 MG tablet Take 0.125 mg by mouth daily.     Marland Kitchen eplerenone (INSPRA) 25 MG tablet Take 25 mg by mouth daily.    Marland Kitchen esomeprazole (NEXIUM) 40 MG capsule Take 40 mg by mouth daily at 12 noon.    . famotidine (PEPCID) 20 MG tablet Take 20 mg by mouth daily.    . hydrALAZINE (APRESOLINE) 10 MG tablet Take 10 mg by mouth 3 (three) times daily.     Marland Kitchen HYDROcodone-acetaminophen (NORCO/VICODIN) 5-325 MG per tablet Take 1 tablet by mouth every 6 (six) hours as needed. 10 tablet 0  . Insulin Glargine (LANTUS SOLOSTAR) 100 UNIT/ML Solostar Pen Inject 25 Units into the skin 2 (two) times daily.    . isosorbide mononitrate (IMDUR) 30 MG 24 hr tablet Take 30 mg by mouth daily.    Marland Kitchen lisinopril (PRINIVIL,ZESTRIL) 5 MG tablet Take 5 mg by mouth every evening.    . metolazone (ZAROXOLYN) 2.5 MG tablet Take 2.5 mg by mouth daily as needed (for increased fluid.).    Marland Kitchen metoprolol succinate (TOPROL-XL) 25 MG 24 hr tablet Take 12.5 mg by mouth daily.     . nateglinide (STARLIX) 60 MG tablet Take 120 mg by mouth 3 (three) times daily.     . tamsulosin (FLOMAX) 0.4 MG CAPS capsule Take 0.4 mg by mouth daily after supper.    . torsemide (DEMADEX) 20 MG tablet Take 20 mg by mouth every evening.     No current facility-administered medications for this encounter.    Filed Vitals:   06/05/14 1500  BP: 94/68  Pulse: 97  Weight: 128 lb (58.06 kg)  SpO2: 100%   PHYSICAL EXAM: General: Chronically ill appearing.  No acute distress.  HEENT: normal except for poor dentition Neck: supple. JVP flat Carotids 2+ bilaterally; no bruits. No lymphadenopathy or thryomegaly appreciated. Cor: PMI displaced laterally. Regular rate and rhythm.  Lungs: clear Abdomen: Non tender. Good bowel sounds. Non distended Extremities: no cyanosis, clubbing, rash,edema Neuro: alert & orientedx3, cranial nerves grossly intact. Moves all 4 extremities w/o difficulty. Affect pleasant.    ASSESSMENT & PLAN:  1) Chronic systolic HF:  NICM s/p [employer] ICD, EF 20-25% (04/2013)   - NYHA II  symptoms and volume status low.  Stop metolazone. Hold torsemide next 2 days. (He says he has been taking metolazone daily). Check BMET stat. -Continue Toprol XL 12.5 mg dialy  - Continue Eplerenone 25 mg daily.  Had gynecomastia on Spiro.  - Hydralazine 10 mg TID. Continue Imdur 30 mg daily  - Continue digoxin 0.125 mg daily, last dig level <0.2 (04/2014) - Continue lisinopril 5 mg daily at night.  - Reinforced the need and importance of daily weights, a low sodium diet, and fluid restriction (less than 2 L a  day). Instructed to call the HF clinic if weight increases more than 3 lbs overnight or 5 lbs in a week.  2) CRI stage 3  - Baseline creatinine 1.5-1.8.  Check BMET now.  3) Recurrent DVTs - No signs or symptoms of DVT - Not on Coumadin d/t hx GIB and he refuses all antocoagulants 4) DM2- Continue to follow with PCP.  5) Tobacco Abuse- Smoking 1/2 PPD 6) Weight loss- Check CBC/LDH     Follow up in 2 weeks   CLEGG,AMY NP-C 3:11 PM  Patient seen and examined with [name], NP. We discussed all aspects of the encounter. I agree with the assessment and plan as stated above.   He has had marked weight loss over the past few months. No B symptoms. Volume status looks low. Agee with cutting back diuretics. Will check labs. BP too soft to titrate HF meds further. Remains NYHA III. Prognosis concerning. Will need to follow closely.   Okema Rollinson,MD 10:27 AM

## 2014-06-05 NOTE — Patient Instructions (Signed)
Stop Metolazone  Hold Torsemide for 2 days, resume on Thursday 06/08/14  Labs today  Your physician recommends that you schedule a follow-up appointment in: 6 weeks

## 2014-06-12 DIAGNOSIS — R634 Abnormal weight loss: Secondary | ICD-10-CM | POA: Insufficient documentation

## 2014-07-12 ENCOUNTER — Telehealth (HOSPITAL_COMMUNITY): Payer: Self-pay | Admitting: Vascular Surgery

## 2014-07-12 NOTE — Telephone Encounter (Signed)
[facility] pt is still non compliant with education, medications and visits...  weight is stable  @ 132

## 2014-07-12 NOTE — Telephone Encounter (Signed)
noted 

## 2014-07-17 ENCOUNTER — Ambulatory Visit (HOSPITAL_COMMUNITY)
Admission: RE | Admit: 2014-07-17 | Discharge: 2014-07-17 | Disposition: A | Discharge status: Home / Self Care | Payer: Medicare HMO | Source: Ambulatory Visit | Attending: Internal Medicine | Admitting: Internal Medicine

## 2014-07-17 ENCOUNTER — Encounter (HOSPITAL_COMMUNITY): Payer: Self-pay

## 2014-07-17 VITALS — BP 118/70 | HR 100 | Wt 133.5 lb

## 2014-07-17 DIAGNOSIS — Z79899 Other long term (current) drug therapy: Secondary | ICD-10-CM | POA: Diagnosis not present

## 2014-07-17 DIAGNOSIS — E119 Type 2 diabetes mellitus without complications: Secondary | ICD-10-CM | POA: Insufficient documentation

## 2014-07-17 DIAGNOSIS — R634 Abnormal weight loss: Secondary | ICD-10-CM | POA: Diagnosis not present

## 2014-07-17 DIAGNOSIS — N183 Chronic kidney disease, stage 3 (moderate): Secondary | ICD-10-CM | POA: Insufficient documentation

## 2014-07-17 DIAGNOSIS — N400 Enlarged prostate without lower urinary tract symptoms: Secondary | ICD-10-CM | POA: Insufficient documentation

## 2014-07-17 DIAGNOSIS — F17210 Nicotine dependence, cigarettes, uncomplicated: Secondary | ICD-10-CM | POA: Insufficient documentation

## 2014-07-17 DIAGNOSIS — R339 Retention of urine, unspecified: Secondary | ICD-10-CM | POA: Insufficient documentation

## 2014-07-17 DIAGNOSIS — I129 Hypertensive chronic kidney disease with stage 1 through stage 4 chronic kidney disease, or unspecified chronic kidney disease: Secondary | ICD-10-CM | POA: Insufficient documentation

## 2014-07-17 DIAGNOSIS — Z86718 Personal history of other venous thrombosis and embolism: Secondary | ICD-10-CM | POA: Diagnosis not present

## 2014-07-17 DIAGNOSIS — I5023 Acute on chronic systolic (congestive) heart failure: Secondary | ICD-10-CM | POA: Diagnosis not present

## 2014-07-17 DIAGNOSIS — E785 Hyperlipidemia, unspecified: Secondary | ICD-10-CM | POA: Diagnosis not present

## 2014-07-17 DIAGNOSIS — K219 Gastro-esophageal reflux disease without esophagitis: Secondary | ICD-10-CM | POA: Diagnosis not present

## 2014-07-17 DIAGNOSIS — I2510 Atherosclerotic heart disease of native coronary artery without angina pectoris: Secondary | ICD-10-CM | POA: Diagnosis not present

## 2014-07-17 DIAGNOSIS — I5022 Chronic systolic (congestive) heart failure: Secondary | ICD-10-CM

## 2014-07-17 LAB — BASIC METABOLIC PANEL
Anion gap: 8 (ref 5–15)
BUN: 19 mg/dL (ref 6–23)
CHLORIDE: 96 mmol/L (ref 96–112)
CO2: 31 mmol/L (ref 19–32)
Calcium: 9.3 mg/dL (ref 8.4–10.5)
Creatinine, Ser: 1.89 mg/dL — ABNORMAL HIGH (ref 0.50–1.35)
GFR calc Af Amer: 41 mL/min — ABNORMAL LOW (ref >90)
GFR calc non Af Amer: 35 mL/min — ABNORMAL LOW (ref >90)
Glucose, Bld: 227 mg/dL — ABNORMAL HIGH (ref 70–99)
Potassium: 3.7 mmol/L (ref 3.5–5.1)
Sodium: 135 mmol/L (ref 135–145)

## 2014-07-17 LAB — CBC
HCT: 43.5 % (ref 39.0–52.0)
HEMOGLOBIN: 14.6 g/dL (ref 13.0–17.0)
MCH: 30.4 pg (ref 26.0–34.0)
MCHC: 33.6 g/dL (ref 30.0–36.0)
MCV: 90.4 fL (ref 78.0–100.0)
Platelets: 197 10*3/uL (ref 150–400)
RBC: 4.81 MIL/uL (ref 4.22–5.81)
RDW: 13.5 % (ref 11.5–15.5)
WBC: 6.2 10*3/uL (ref 4.0–10.5)

## 2014-07-17 LAB — TSH: TSH: 1.445 u[IU]/mL (ref 0.350–4.500)

## 2014-07-17 MED ORDER — METOPROLOL SUCCINATE ER 25 MG PO TB24: 12.5000 mg | ORAL_TABLET | Freq: Two times a day (BID) | ORAL | Status: DC

## 2014-07-17 NOTE — Progress Notes (Signed)
Patient ID: [name], male   DOB: [birth year], 69 y.o.   MRN: [id number]  PCP: Dr. Jeanelle Malling [name] Urologist: Dr [name] EP: Dr. Ladona [name]  HPI:  [name] is a 69 year old male with chronic systolic CHF secondary to NICM echo EF 30-35%, HTN, anemia, chronic renal insufficiency, UGI bleed, duodenal AVMs,  rectal bleeding 04/2011,  DVTs (2004, 2005, 2007, 2010, 2011) however coumadin on hold due to GI bleed, IVC filter 2005, GERD, BPD, Urinary Retention, and chronic low back.  09/2011 S/P ORIF L ankle. S/P ICD January 2015.  Spironolactone switched to eplerenone due to gynecomastia.  Admitted from HF clinic 05/30/2012 with volume overload. Diuresed with Milrinone and Lasix. Diuresed 41 pounds. Milrinone and lasix gtt weaned off. CO-OX dropped 43. Beta blocker was stopped due to low output. He was offered home Milrinone however he declined.  Discharge weight 175 pounds.  Admitted to [facility] 12/28 through 12/30 with uncontrolled DM due to noncompliance. Discharge weight  134 pounds.   Follow up for Heart Failure: At last visit weight was way down. He was taking daily metolazone and we stopped that. Torsemide continued at  daily. Overall feeling ok. Denies SOB/PND/Orthopnea. Denies dizziness. Now taking all medications. Weight up 5 pounds. Denies dizziness. Appetite improving. Continues to smoke a few cigarettes per day.[facility] following. Living at home alone.   08/13/11 CPX  RER 1.16 Peak VO2 18.6 ml/kg/min Predicted 61.4% VE/VCO2 slope 42.8 08/23/2011 ECHO EF 30-35% 05/09/13 ECHO EF 20-25% RV mod-severely hypokinetic  Labs 02/23/13 K 3.9 Creatinine 1.58 Dig level 0.9  Labs 05/31/13 K 4.0 Creatinine  1.6  Labs 06/05/14 K 3.7 Creatinine 1.39 hgb 15  SH: Lives alone, occasional smoker now, no ETOH or drugs FH: [name] deceased: stroke, HTN,         [name] deceased: no issues, died from intracranial bleed from fall        4 sisters and 3 brothers: no CAD   ROS: All systems negative except as  listed in HPI, PMH and Problem List.  Past Medical History  Diagnosis Date  . Venous thromboembolism     DVTs in 2004, 2005, 2007, 8/10, 8/11. IVC filter 2005. Off coumadin 04/2011 secondary to rectal bleeding  . 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 Chronic venous insufficiency   . DM2 (diabetes mellitus, type 2)   . GERD (gastroesophageal reflux disease)   . BPH (benign prostatic hypertrophy)   . Low back pain   . HTN (hypertension)   . HLD (hyperlipidemia)   . NICM (nonischemic cardiomyopathy) 1. 9/11  2. 2/12    1. Echo septal and apical akinesis, dilated LV, EF 45%, RV normal size and systolic function, EF 43% on Myoview  2. Admitted with decompensated CHF, echo EF 30-35% with diffuse hypokinesis but akinesis of mid to apical anteroseptal wall and apex, moderate LVH, mild MR, grade I diastolic dysfunction, SPEP/UP negative and HIV negative. Most recent EF 10-15% 04/2011  . Smoking   . CAD (coronary artery disease) 1. 2007  2. 10/11    1. Left heart cath with 40-50% stenosis in small RCA EF 50%  2. Lexiscan myoview EF 43%, global hypokinesis, possible small area of apical ischemia; LHC no angiographic CAD, no LV-gram done due to CKD   2. No angiographic CAD by cath 02/2010  . CKD (chronic kidney disease) stage 2, GFR 60-89 ml/min as of 02/2011  . Headache(784.0)   . Diabetes mellitus   .  Blood transfusion   . Anemia   . Syncope     1. in setting of presumed orthostatic Hypotn 09/2011  . Right fibular fracture     1. 09/2011 - resulting from fall/syncope  . Closed right ankle fracture 10/10/2011    Current Outpatient Prescriptions  Medication Sig Dispense Refill  . allopurinol (ZYLOPRIM) 100 MG tablet TAKE 1 TABLET BY MOUTH DAILY 90 tablet 3  . atorvastatin (LIPITOR) 20 MG tablet Take 20 mg by mouth every morning.     . digoxin (LANOXIN) 0.125 MG tablet Take 0.125 mg by mouth daily.    Marland Kitchen eplerenone (INSPRA) 25 MG tablet Take 25 mg by mouth daily.    Marland Kitchen  esomeprazole (NEXIUM) 40 MG capsule Take 40 mg by mouth daily at 12 noon.    . famotidine (PEPCID) 20 MG tablet Take 20 mg by mouth daily.    . hydrALAZINE (APRESOLINE) 10 MG tablet Take 10 mg by mouth 3 (three) times daily.     Marland Kitchen HYDROcodone-acetaminophen (NORCO/VICODIN) 5-325 MG per tablet Take 1 tablet by mouth every 6 (six) hours as needed. 10 tablet 0  . Insulin Glargine (LANTUS SOLOSTAR) 100 UNIT/ML Solostar Pen Inject 25 Units into the skin 2 (two) times daily.    . isosorbide mononitrate (IMDUR) 30 MG 24 hr tablet Take 30 mg by mouth daily.    Marland Kitchen lisinopril (PRINIVIL,ZESTRIL) 5 MG tablet Take 5 mg by mouth every evening.    . metoprolol succinate (TOPROL-XL) 25 MG 24 hr tablet Take 12.5 mg by mouth daily.     . nateglinide (STARLIX) 60 MG tablet Take 120 mg by mouth 3 (three) times daily.     . tamsulosin (FLOMAX) 0.4 MG CAPS capsule Take 0.4 mg by mouth daily after supper.    . torsemide (DEMADEX) 20 MG tablet Take 20 mg by mouth every evening.     No current facility-administered medications for this encounter.    Filed Vitals:   07/17/14 1428  BP: 118/70  Pulse: 100  Weight: 133 lb 8 oz (60.555 kg)  SpO2: 95%   PHYSICAL EXAM: General: Chronically ill appearing.  No acute distress.  HEENT: normal except for poor dentition Neck: supple. JVP flat Carotids 2+ bilaterally; no bruits. No lymphadenopathy or thryomegaly appreciated. Cor: PMI displaced laterally. Tachy Regular rate and rhythm.  Lungs: clear Abdomen: Non tender. Good bowel sounds. Non distended Extremities: no cyanosis, clubbing, rash,edema Neuro: alert & orientedx3, cranial nerves grossly intact. Moves all 4 extremities w/o difficulty. Affect pleasant.  ECG: Sinus tach 129 LVH. RAE  ASSESSMENT & PLAN:  1) Chronic systolic HF:  NICM s/p St. Jude ICD, EF 20-25% (04/2013)   - Chronic NYHA III. Volume status ok but remains very tachycardic - Increase Toprol to 25 bid. Recheck echo   - Continue Eplerenone 25 mg  daily.  Had gynecomastia on Spiro.  - Hydralazine 10 mg TID. Continue Imdur 30 mg daily  - Continue digoxin 0.125 mg daily, last dig level <0.2 (04/2014) - Continue lisinopril 5 mg daily at night.  - Reinforced the need and importance of daily weights, a low sodium diet, and fluid restriction (less than 2 L a day). Instructed to call the HF clinic if weight increases more than 3 lbs overnight or 5 lbs in a week.  2) CRI stage 3  - Baseline creatinine 1.5-1.8.  Stable on recent check. 3) Recurrent DVTs - No signs or symptoms of DVT - Not on Coumadin d/t hx GIB and he refuses all  antocoagulants 4) DM2- Continue to follow with PCP.  5) Tobacco Abuse- Smoking 1/2 PPD. Counseled again on need to quit 6) Weight loss- I remain concerned about his deterioration and have high suspicion for underlying CA. Will scan C/A/P   Truman Hayward 2:46 PM

## 2014-07-17 NOTE — Patient Instructions (Addendum)
Increase Metoprolol to 25 mg Twice daily   Labs today  Your physician has requested that you have an echocardiogram. Echocardiography is a painless test that uses sound waves to create images of your heart. It provides your doctor with information about the size and shape of your heart and how well your heart's chambers and valves are working. This procedure takes approximately one hour. There are no restrictions for this procedure.  Non-Cardiac CT scanning, (CAT scanning), is a noninvasive, special x-ray that produces cross-sectional images of the body using x-rays and a computer. CT scans help physicians diagnose and treat medical conditions. For some CT exams, a contrast material is used to enhance visibility in the area of the body being studied. CT scans provide greater clarity and reveal more details than regular x-ray exams.  CT (NON CONTRAST) OF CHEST, ABD AND PELVIS.  Your physician recommends that you schedule a follow-up appointment in: 6 weeks

## 2014-07-20 ENCOUNTER — Ambulatory Visit (HOSPITAL_COMMUNITY)
Admission: RE | Admit: 2014-07-20 | Discharge: 2014-07-20 | Disposition: A | Discharge status: Home / Self Care | Payer: Medicare HMO | Source: Ambulatory Visit | Attending: Internal Medicine | Admitting: Internal Medicine

## 2014-07-20 DIAGNOSIS — R197 Diarrhea, unspecified: Secondary | ICD-10-CM | POA: Insufficient documentation

## 2014-07-20 DIAGNOSIS — R634 Abnormal weight loss: Secondary | ICD-10-CM | POA: Insufficient documentation

## 2014-08-07 ENCOUNTER — Ambulatory Visit (HOSPITAL_COMMUNITY): Payer: Commercial Managed Care - HMO

## 2014-08-09 ENCOUNTER — Other Ambulatory Visit: Payer: Self-pay | Admitting: *Deleted

## 2014-08-09 MED ORDER — ALLOPURINOL 100 MG PO TABS: 100.0000 mg | ORAL_TABLET | Freq: Every day | ORAL | Status: DC

## 2014-08-10 ENCOUNTER — Ambulatory Visit (HOSPITAL_BASED_OUTPATIENT_CLINIC_OR_DEPARTMENT_OTHER)
Admission: RE | Admit: 2014-08-10 | Discharge: 2014-08-10 | Disposition: A | Discharge status: Home / Self Care | Payer: Medicare HMO | Source: Ambulatory Visit | Attending: Internal Medicine | Admitting: Internal Medicine

## 2014-08-10 ENCOUNTER — Ambulatory Visit (HOSPITAL_COMMUNITY)
Admission: RE | Admit: 2014-08-10 | Discharge: 2014-08-10 | Disposition: A | Discharge status: Home / Self Care | Payer: Medicare HMO | Source: Ambulatory Visit | Attending: Internal Medicine | Admitting: Internal Medicine

## 2014-08-10 ENCOUNTER — Encounter (HOSPITAL_COMMUNITY): Payer: Self-pay | Admitting: *Deleted

## 2014-08-10 VITALS — BP 82/54 | HR 119 | Wt 138.0 lb

## 2014-08-10 DIAGNOSIS — I213 ST elevation (STEMI) myocardial infarction of unspecified site: Secondary | ICD-10-CM | POA: Diagnosis not present

## 2014-08-10 DIAGNOSIS — I5022 Chronic systolic (congestive) heart failure: Secondary | ICD-10-CM | POA: Diagnosis not present

## 2014-08-10 DIAGNOSIS — I513 Intracardiac thrombosis, not elsewhere classified: Secondary | ICD-10-CM | POA: Insufficient documentation

## 2014-08-10 DIAGNOSIS — I240 Acute coronary thrombosis not resulting in myocardial infarction: Secondary | ICD-10-CM | POA: Diagnosis not present

## 2014-08-10 DIAGNOSIS — I509 Heart failure, unspecified: Secondary | ICD-10-CM | POA: Diagnosis not present

## 2014-08-10 MED ORDER — APIXABAN 2.5 MG PO TABS: 2.5000 mg | ORAL_TABLET | Freq: Two times a day (BID) | ORAL | Status: DC

## 2014-08-10 NOTE — Patient Instructions (Addendum)
Start Eliquis 2.5 mg Twice daily   Stop Metoprolol  Heart Catheterization scheduled for Tuesday 3/29, see instruction sheet

## 2014-08-10 NOTE — Progress Notes (Signed)
Patient ID: [name], male   DOB: Oct 14, 1945, 69 y.o.   MRN: [id number]  PCP: Dr. Jeanelle Malling Osei-Bonsu Urologist: Dr Retta Diones EP: Dr. Ladona Ridgel  HPI:  [name] is a 69 year old male with chronic systolic CHF secondary to NICM echo EF 30-35%, HTN, anemia, chronic renal insufficiency, UGI bleed, duodenal AVMs,  rectal bleeding 04/2011,  DVTs (2004, 2005, 2007, 2010, 2011) however coumadin on hold due to GI bleed, IVC filter 2005, GERD, BPD, Urinary Retention, and chronic low back.  09/2011 S/P ORIF L ankle. S/P ICD January 2015.  Spironolactone switched to eplerenone due to gynecomastia.  Admitted from HF clinic 05/30/2012 with volume overload. Diuresed with Milrinone and Lasix. Diuresed 41 pounds. Milrinone and lasix gtt weaned off. CO-OX dropped 43. Beta blocker was stopped due to low output. He was offered home Milrinone however he declined.  Discharge weight 175 pounds.  Admitted to [facility] 12/28 through 12/30 with uncontrolled DM due to noncompliance. Discharge weight  134 pounds.   Follow up for Heart Failure:  He is being seen today because his of change noted on ECHO.  Says he feels ok. Denies SOB/PND/Orthopnea. Has to take rest breaks with ADLs. Weight at home 134-140 pounds. Appetite ok. Living at home alone. Smokes 1/2 ppd.   08/13/11 CPX  RER 1.16 Peak VO2 18.6 ml/kg/min Predicted 61.4% VE/VCO2 slope 42.8 08/23/2011 ECHO EF 30-35% 05/09/13 ECHO EF 20-25% RV mod-severely hypokinetic 08/10/2014: EF 10% Moderate to severe RV dysfunction LV thrombus   Labs 02/23/13 K 3.9 Creatinine 1.58 Dig level 0.9  Labs 05/31/13 K 4.0 Creatinine  1.6  Labs 06/05/14 K 3.7 Creatinine 1.39 hgb 15 Labs 07/17/2014: K 3.7 Creatinine 1.89   SH: Lives alone, occasional smoker now, no ETOH or drugs FH: Mother deceased: stroke, HTN,         Father deceased: no issues, died from intracranial bleed from fall        4 sisters and 3 brothers: no CAD   ROS: All systems negative except as listed in HPI, PMH and  Problem List.  Past Medical History  Diagnosis Date  . Venous thromboembolism     DVTs in 2004, 2005, 2007, 8/10, 8/11. IVC filter 2005. Off coumadin 04/2011 secondary to rectal bleeding  . 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 Chronic venous insufficiency   . DM2 (diabetes mellitus, type 2)   . GERD (gastroesophageal reflux disease)   . BPH (benign prostatic hypertrophy)   . Low back pain   . HTN (hypertension)   . HLD (hyperlipidemia)   . NICM (nonischemic cardiomyopathy) 1. 9/11  2. 2/12    1. Echo septal and apical akinesis, dilated LV, EF 45%, RV normal size and systolic function, EF 43% on Myoview  2. Admitted with decompensated CHF, echo EF 30-35% with diffuse hypokinesis but akinesis of mid to apical anteroseptal wall and apex, moderate LVH, mild MR, grade I diastolic dysfunction, SPEP/UP negative and HIV negative. Most recent EF 10-15% 04/2011  . Smoking   . CAD (coronary artery disease) 1. 2007  2. 10/11    1. Left heart cath with 40-50% stenosis in small RCA EF 50%  2. Lexiscan myoview EF 43%, global hypokinesis, possible small area of apical ischemia; LHC no angiographic CAD, no LV-gram done due to CKD   2. No angiographic CAD by cath 02/2010  . CKD (chronic kidney disease) stage 2, GFR 60-89 ml/min as of 02/2011  . Headache(784.0)   .  Diabetes mellitus   . Blood transfusion   . Anemia   . Syncope     1. in setting of presumed orthostatic Hypotn 09/2011  . Right fibular fracture     1. 09/2011 - resulting from fall/syncope  . Closed right ankle fracture 10/10/2011    Current Outpatient Prescriptions  Medication Sig Dispense Refill  . allopurinol (ZYLOPRIM) 100 MG tablet Take 1 tablet (100 mg total) by mouth daily. 90 tablet 3  . atorvastatin (LIPITOR) 20 MG tablet Take 20 mg by mouth every morning.     . digoxin (LANOXIN) 0.125 MG tablet Take 0.125 mg by mouth daily.    Marland Kitchen eplerenone (INSPRA) 25 MG tablet Take 25 mg by mouth daily.    Marland Kitchen  esomeprazole (NEXIUM) 40 MG capsule Take 40 mg by mouth daily at 12 noon.    . famotidine (PEPCID) 20 MG tablet Take 20 mg by mouth daily.    . hydrALAZINE (APRESOLINE) 10 MG tablet Take 10 mg by mouth 3 (three) times daily.     Marland Kitchen HYDROcodone-acetaminophen (NORCO/VICODIN) 5-325 MG per tablet Take 1 tablet by mouth every 6 (six) hours as needed. 10 tablet 0  . Insulin Glargine (LANTUS SOLOSTAR) 100 UNIT/ML Solostar Pen Inject 25 Units into the skin 2 (two) times daily.    . isosorbide mononitrate (IMDUR) 30 MG 24 hr tablet Take 30 mg by mouth daily.    Marland Kitchen lisinopril (PRINIVIL,ZESTRIL) 5 MG tablet Take 5 mg by mouth every evening.    . metoprolol succinate (TOPROL-XL) 25 MG 24 hr tablet Take 0.5 tablets (12.5 mg total) by mouth 2 (two) times daily. 60 tablet 3  . nateglinide (STARLIX) 60 MG tablet Take 120 mg by mouth 3 (three) times daily.     . tamsulosin (FLOMAX) 0.4 MG CAPS capsule Take 0.4 mg by mouth daily after supper.    . torsemide (DEMADEX) 20 MG tablet Take 20 mg by mouth every evening.     No current facility-administered medications for this encounter.    Filed Vitals:   08/10/14 1519  BP: 82/54  Pulse: 119  Weight: 138 lb (62.596 kg)  SpO2: 98%   PHYSICAL EXAM: General: Chronically ill appearing.  No acute distress.  HEENT: normal except for poor dentition Neck: supple. JVP flat Carotids 2+ bilaterally; no bruits. No lymphadenopathy or thryomegaly appreciated. Cor: PMI displaced laterally. Tachy Regular rate and rhythm.  Lungs: clear Abdomen: Non tender. Good bowel sounds. Non distended Extremities: no cyanosis, clubbing, rash,edema Neuro: alert & orientedx3, cranial nerves grossly intact. Moves all 4 extremities w/o difficulty. Affect pleasant.    ASSESSMENT & PLAN:  1) Chronic systolic HF:  NICM s/p St. Jude ICD,  ECHO 10% with moderate to severe RV HK today with LV thrombus noted.   NYHA IIIb . Volume status ok. Today he is tachycardic and concerning for low  output. Stop toprol 25 bid.   - Continue Eplerenone 25 mg daily.  Had gynecomastia on Spiro.  - Hydralazine 10 mg TID. Continue Imdur 30 mg daily  - Continue digoxin 0.125 mg daily, last dig level <0.2 (04/2014) - Continue lisinopril 5 mg daily at night.  Discussed advanced therapies. He is not a candidate for heart transplant due to nicotine. Offered RHC to assess hemodynamics.  Offered hospital admit today however he would like to wait until Monday.  2) CRI stage 3  - Baseline creatinine 1.5-1.8.   3) Recurrent DVTs - No signs or symptoms of DVT - Not on Coumadin d/t hx  GIB and he refuses all antocoagulants 4) DM2- Continue to follow with PCP.  5) Tobacco Abuse- Smoking 1 pack every 2-3 days.  6) Weight loss-  7) LV thrombus noted today on ECHO- needs anticoagulation. Refuses coumadin/ He is agreeable to start 2.5 mg eliquis twice a day which will also cover recurrent DVTs based on [employer] -EXT data.   CLEGG,AMY, NP-C  3:47 PM  Patient seen and examined with [name], NP. We discussed all aspects of the encounter. I agree with the assessment and plan as stated above.   Echo reviewed personally. He continues to struggle with worsening functional status. With his smoking history and profound weight loss, I was initially concerned about a malignancy but CT C/A/P were ok. Echo today with severe biventricular dysfunction and LV clot. We had a long talk about options particularly with regard to hospice versus aggressive rx. He would like aggressive rx. Not tx or VAD candidate currently. Will scheduled RHC next week with consideration of milrinone. Stop toprol. Start Eliquis.   Total time spent 45 minutes. Over half that time spent discussing above.   [place] Ice,MD 4:44 PM

## 2014-08-10 NOTE — Progress Notes (Signed)
  Echocardiogram 2D Echocardiogram has been performed.  Sophiya Morello FRANCES 08/10/2014, 3:00 PM

## 2014-08-12 ENCOUNTER — Emergency Department (HOSPITAL_COMMUNITY): Payer: Medicare HMO

## 2014-08-12 ENCOUNTER — Other Ambulatory Visit: Payer: Self-pay

## 2014-08-12 ENCOUNTER — Encounter (HOSPITAL_COMMUNITY): Payer: Self-pay | Admitting: Emergency Medicine

## 2014-08-12 ENCOUNTER — Emergency Department (HOSPITAL_COMMUNITY)
Admission: EM | Admit: 2014-08-12 | Discharge: 2014-08-12 | Disposition: A | Discharge status: Home / Self Care | Payer: Medicare HMO | Attending: Emergency Medicine | Admitting: Emergency Medicine

## 2014-08-12 DIAGNOSIS — Z79899 Other long term (current) drug therapy: Secondary | ICD-10-CM | POA: Diagnosis not present

## 2014-08-12 DIAGNOSIS — Z8781 Personal history of (healed) traumatic fracture: Secondary | ICD-10-CM | POA: Diagnosis not present

## 2014-08-12 DIAGNOSIS — E119 Type 2 diabetes mellitus without complications: Secondary | ICD-10-CM | POA: Insufficient documentation

## 2014-08-12 DIAGNOSIS — Z720 Tobacco use: Secondary | ICD-10-CM | POA: Insufficient documentation

## 2014-08-12 DIAGNOSIS — R079 Chest pain, unspecified: Secondary | ICD-10-CM | POA: Insufficient documentation

## 2014-08-12 DIAGNOSIS — N400 Enlarged prostate without lower urinary tract symptoms: Secondary | ICD-10-CM | POA: Diagnosis not present

## 2014-08-12 DIAGNOSIS — N182 Chronic kidney disease, stage 2 (mild): Secondary | ICD-10-CM | POA: Diagnosis not present

## 2014-08-12 DIAGNOSIS — Z86718 Personal history of other venous thrombosis and embolism: Secondary | ICD-10-CM | POA: Insufficient documentation

## 2014-08-12 DIAGNOSIS — Q2730 Arteriovenous malformation, site unspecified: Secondary | ICD-10-CM | POA: Diagnosis not present

## 2014-08-12 DIAGNOSIS — Z862 Personal history of diseases of the blood and blood-forming organs and certain disorders involving the immune mechanism: Secondary | ICD-10-CM | POA: Insufficient documentation

## 2014-08-12 DIAGNOSIS — I129 Hypertensive chronic kidney disease with stage 1 through stage 4 chronic kidney disease, or unspecified chronic kidney disease: Secondary | ICD-10-CM | POA: Diagnosis not present

## 2014-08-12 DIAGNOSIS — K219 Gastro-esophageal reflux disease without esophagitis: Secondary | ICD-10-CM | POA: Diagnosis not present

## 2014-08-12 DIAGNOSIS — I2510 Atherosclerotic heart disease of native coronary artery without angina pectoris: Secondary | ICD-10-CM | POA: Insufficient documentation

## 2014-08-12 DIAGNOSIS — Z794 Long term (current) use of insulin: Secondary | ICD-10-CM | POA: Diagnosis not present

## 2014-08-12 DIAGNOSIS — N183 Chronic kidney disease, stage 3 (moderate): Secondary | ICD-10-CM | POA: Diagnosis not present

## 2014-08-12 DIAGNOSIS — I5022 Chronic systolic (congestive) heart failure: Secondary | ICD-10-CM

## 2014-08-12 DIAGNOSIS — E785 Hyperlipidemia, unspecified: Secondary | ICD-10-CM | POA: Diagnosis not present

## 2014-08-12 LAB — COMPREHENSIVE METABOLIC PANEL
ALBUMIN: 3.7 g/dL (ref 3.5–5.2)
ALT: 175 U/L — ABNORMAL HIGH (ref 0–53)
AST: 98 U/L — ABNORMAL HIGH (ref 0–37)
Alkaline Phosphatase: 153 U/L — ABNORMAL HIGH (ref 39–117)
Anion gap: 9 (ref 5–15)
BUN: 36 mg/dL — AB (ref 6–23)
CALCIUM: 9.2 mg/dL (ref 8.4–10.5)
CO2: 24 mmol/L (ref 19–32)
Chloride: 102 mmol/L (ref 96–112)
Creatinine, Ser: 2.32 mg/dL — ABNORMAL HIGH (ref 0.50–1.35)
GFR calc non Af Amer: 28 mL/min — ABNORMAL LOW (ref >90)
GFR, EST AFRICAN AMERICAN: 32 mL/min — AB (ref >90)
Glucose, Bld: 201 mg/dL — ABNORMAL HIGH (ref 70–99)
Potassium: 4.2 mmol/L (ref 3.5–5.1)
Sodium: 135 mmol/L (ref 135–145)
Total Bilirubin: 1.7 mg/dL — ABNORMAL HIGH (ref 0.3–1.2)
Total Protein: 6.1 g/dL (ref 6.0–8.3)

## 2014-08-12 LAB — URINALYSIS, ROUTINE W REFLEX MICROSCOPIC
BILIRUBIN URINE: NEGATIVE
Glucose, UA: NEGATIVE mg/dL
Hgb urine dipstick: NEGATIVE
KETONES UR: NEGATIVE mg/dL
Leukocytes, UA: NEGATIVE
NITRITE: NEGATIVE
PROTEIN: 30 mg/dL — AB
Specific Gravity, Urine: 1.019 (ref 1.005–1.030)
UROBILINOGEN UA: 1 mg/dL (ref 0.0–1.0)
pH: 5 (ref 5.0–8.0)

## 2014-08-12 LAB — URINE MICROSCOPIC-ADD ON

## 2014-08-12 LAB — CBC
HEMATOCRIT: 39.4 % (ref 39.0–52.0)
Hemoglobin: 13.2 g/dL (ref 13.0–17.0)
MCH: 30.5 pg (ref 26.0–34.0)
MCHC: 33.5 g/dL (ref 30.0–36.0)
MCV: 91 fL (ref 78.0–100.0)
Platelets: 139 10*3/uL — ABNORMAL LOW (ref 150–400)
RBC: 4.33 MIL/uL (ref 4.22–5.81)
RDW: 14 % (ref 11.5–15.5)
WBC: 5 10*3/uL (ref 4.0–10.5)

## 2014-08-12 LAB — I-STAT TROPONIN, ED: Troponin i, poc: 0.02 ng/mL (ref 0.00–0.08)

## 2014-08-12 LAB — PROTIME-INR
INR: 1.47 (ref 0.00–1.49)
Prothrombin Time: 18 seconds — ABNORMAL HIGH (ref 11.6–15.2)

## 2014-08-12 LAB — APTT: aPTT: 30 seconds (ref 24–37)

## 2014-08-12 MED ORDER — NITROGLYCERIN 0.4 MG SL SUBL
0.4000 mg | SUBLINGUAL_TABLET | SUBLINGUAL | Status: DC | PRN
  Administered 2014-08-12 (×2): 0.4 mg via SUBLINGUAL
  Filled 2014-08-12: qty 1

## 2014-08-12 MED ORDER — SODIUM CHLORIDE 0.9 % IV SOLN
1000.0000 mL | INTRAVENOUS | Status: DC
  Administered 2014-08-12: 1000 mL via INTRAVENOUS

## 2014-08-12 MED ORDER — ASPIRIN 81 MG PO CHEW
324.0000 mg | CHEWABLE_TABLET | Freq: Once | ORAL | Status: AC
  Administered 2014-08-12: 324 mg via ORAL
  Filled 2014-08-12: qty 4

## 2014-08-12 NOTE — Discharge Instructions (Signed)

## 2014-08-12 NOTE — ED Notes (Signed)
Patient is resting comfortably. 

## 2014-08-12 NOTE — ED Notes (Addendum)
MD at bedside; pt is upset and wants to leave. MD and RN talking to pt trying to explain delay. PT wants food. MD oked.

## 2014-08-12 NOTE — H&P (Signed)
Patient ID: [name], male DOB: [birth year], 69 y.o. MRN: [id number]  PCP: Dr. Jeanelle Malling Osei-Bonsu Urologist: Dr Retta Diones EP: Dr. Ladona Ridgel  HPI:  [name] is a 69 year old male with chronic systolic CHF secondary to NICM echo EF 30-35%, HTN, anemia, chronic renal insufficiency, UGI bleed, duodenal AVMs, rectal bleeding 04/2011, DVTs (2004, 2005, 2007, 2010, 2011) however coumadin on hold due to GI bleed, IVC filter 2005, GERD, BPD, Urinary Retention, and chronic low back. 09/2011 S/P ORIF L ankle. S/P ICD January 2015. Spironolactone switched to eplerenone due to gynecomastia.  Admitted from HF clinic 05/30/2012 with volume overload. Diuresed with Milrinone and Lasix. Diuresed 41 pounds. Milrinone and lasix gtt weaned off. CO-OX dropped 43. Beta blocker was stopped due to low output. He was offered home Milrinone however he declined. Discharge weight 175 pounds.  Admitted to [facility] 12/28 through 12/30 with uncontrolled DM due to noncompliance. Discharge weight 134 pounds.      He was seen last week  because   of change noted on ECHO. >>EF and LV thrombus   Came intdoay with complaints of Chest pain and lightheadedness Now he feels better and wants to go home   08/13/11 CPX RER 1.16 Peak VO2 18.6 ml/kg/min Predicted 61.4% VE/VCO2 slope 42.8 08/23/2011 ECHO EF 30-35% 05/09/13 ECHO EF 20-25% RV mod-severely hypokinetic 08/10/2014: EF 10% Moderate to severe RV dysfunction LV thrombus   Labs 02/23/13 K 3.9 Creatinine 1.58 Dig level 0.9  Labs 05/31/13 K 4.0 Creatinine 1.6  Labs 06/05/14 K 3.7 Creatinine 1.39 hgb 15 Labs 07/17/2014: K 3.7 Creatinine 1.89   SH: Lives alone, occasional smoker now, no ETOH or drugs FH: Mother deceased: stroke, HTN,   Father deceased: no issues, died from intracranial bleed from fall  4 sisters and 3 brothers: no CAD  ROS: All systems negative except as listed in HPI, PMH and Problem List.  Past Medical  History  Diagnosis Date  . Venous thromboembolism     DVTs in 2004, 2005, 2007, 8/10, 8/11. IVC filter 2005. Off coumadin 04/2011 secondary to rectal bleeding  . 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 Chronic venous insufficiency   . DM2 (diabetes mellitus, type 2)   . GERD (gastroesophageal reflux disease)   . BPH (benign prostatic hypertrophy)   . Low back pain   . HTN (hypertension)   . HLD (hyperlipidemia)   . NICM (nonischemic cardiomyopathy) 1. 9/11 2. 2/12    1. Echo septal and apical akinesis, dilated LV, EF 45%, RV normal size and systolic function, EF 43% on Myoview 2. Admitted with decompensated CHF, echo EF 30-35% with diffuse hypokinesis but akinesis of mid to apical anteroseptal wall and apex, moderate LVH, mild MR, grade I diastolic dysfunction, SPEP/UP negative and HIV negative. Most recent EF 10-15% 04/2011  . Smoking   . CAD (coronary artery disease) 1. 2007 2. 10/11    1. Left heart cath with 40-50% stenosis in small RCA EF 50% 2. Lexiscan myoview EF 43%, global hypokinesis, possible small area of apical ischemia; LHC no angiographic CAD, no LV-gram done due to CKD 2. No angiographic CAD by cath 02/2010  . CKD (chronic kidney disease) stage 2, GFR 60-89 ml/min as of 02/2011  . Headache(784.0)   . Diabetes mellitus   . Blood transfusion   . Anemia   . Syncope     1. in setting of presumed orthostatic Hypotn 09/2011  .  Right fibular fracture     1. 09/2011 - resulting from fall/syncope  . Closed right ankle fracture 10/10/2011    Current Outpatient Prescriptions  Medication Sig Dispense Refill  . allopurinol (ZYLOPRIM) 100 MG tablet Take 1 tablet (100 mg total) by mouth daily. 90 tablet 3  . atorvastatin (LIPITOR) 20 MG tablet Take 20 mg by mouth every morning.     . digoxin (LANOXIN) 0.125 MG tablet Take 0.125 mg by mouth daily.    Marland Kitchen  eplerenone (INSPRA) 25 MG tablet Take 25 mg by mouth daily.    Marland Kitchen esomeprazole (NEXIUM) 40 MG capsule Take 40 mg by mouth daily at 12 noon.    . famotidine (PEPCID) 20 MG tablet Take 20 mg by mouth daily.    . hydrALAZINE (APRESOLINE) 10 MG tablet Take 10 mg by mouth 3 (three) times daily.     Marland Kitchen HYDROcodone-acetaminophen (NORCO/VICODIN) 5-325 MG per tablet Take 1 tablet by mouth every 6 (six) hours as needed. 10 tablet 0  . Insulin Glargine (LANTUS SOLOSTAR) 100 UNIT/ML Solostar Pen Inject 25 Units into the skin 2 (two) times daily.    . isosorbide mononitrate (IMDUR) 30 MG 24 hr tablet Take 30 mg by mouth daily.    Marland Kitchen lisinopril (PRINIVIL,ZESTRIL) 5 MG tablet Take 5 mg by mouth every evening.    . metoprolol succinate (TOPROL-XL) 25 MG 24 hr tablet Take 0.5 tablets (12.5 mg total) by mouth 2 (two) times daily. 60 tablet 3  . nateglinide (STARLIX) 60 MG tablet Take 120 mg by mouth 3 (three) times daily.     . tamsulosin (FLOMAX) 0.4 MG CAPS capsule Take 0.4 mg by mouth daily after supper.    . torsemide (DEMADEX) 20 MG tablet Take 20 mg by mouth every evening.     No current facility-administered medications for this encounter.    Filed Vitals:   08/10/14 1519  BP: 82/54  Pulse: 119  Weight: 138 lb (62.596 kg)  SpO2: 98%   PHYSICAL EXAM: General: Chronically ill appearing. No acute distress.  Alert and oriented in no acute distress HENT- normal Eyes- EOMI, without scleral icterus Skin- warm and dry; without rashes LN-neg Neck- supple without thyromegaly, JVP10  carotids brisk and full without bruits Back-without CVAT or kyphosis Lungs-clear to auscultation CV-rapid  But Regular rate and rhythm, nl S1 and S2 2/6 sys m to apex Abd-soft with active bowel sounds; no midline pulsation or hepatomegaly Pulses-intact femoral and distal MKS-without gross deformity Neuro- Ax O, CN3-12 intact, grossly normal motor  and sensory function Affect engaging   ASSESSMENT & PLAN:  1) Chronic systolic HF: NICM s/p St. Jude ICD,  ECHO 10% with moderate to severe RV HK today with LV thrombus noted.  NYHA IIIb . Volume status ok. Today he is tachycardic and concerning for low output. Stop toprol 25 bid.  - Continue Eplerenone 25 mg daily. Had gynecomastia on Spiro.  - Hydralazine 10 mg TID. Continue Imdur 30 mg daily  - Continue digoxin 0.125 mg daily, last dig level <0.2 (04/2014) - Continue lisinopril 5 mg daily at night.  Discussed advanced therapies. He is not a candidate for heart transplant due to nicotine. Offered RHC to assess hemodynamics. this is in anticpation of the use of iontropes   2) CRI stage 3  - Baseline creatinine 1.5-1.8. now 2.3  3) Recurrent DVTs - No signs or symptoms of DVT - Not on Coumadin d/t hx GIB and he refuses all antocoagulants 4) LV thrombus on ECHO- needs  anticoagulation. Refuses coumadin/ He is agreeable to start 2.5 mg eliquis twice a day which will also cover recurrent DVTs based on [employer] -EXT data.  5) orthostasis      discussed with patient and communicated with Dr Dorthea Cove  We will let pt go home on same meds and have him take imdur at night to minimize hypotension

## 2014-08-12 NOTE — ED Notes (Signed)
Pt is stating he would like to leave.  Sts he is uncomfortable in bed, feels better than when he got here, and does not want to sit around anymore.  MD made aware.  Pt encouraged to stay, and explained at this time reasoning for keeping him.  Cardiology still to see.

## 2014-08-12 NOTE — ED Notes (Signed)
Pt due for cath on Tues with Dr. Milas Kocher; sign cardiac hx; substernal chest pain starting around 0730. Pt given 1 nitro and no aspirin given. Pt on eliquis per home meds. Nitro did not help the pain.

## 2014-08-12 NOTE — ED Notes (Signed)
Heart healthy diet ordered per MD ok and pt request. Visitor at bedside.

## 2014-08-12 NOTE — ED Notes (Signed)
Cardiology at bedside.

## 2014-08-12 NOTE — ED Provider Notes (Signed)
CSN: [id number]     Arrival date & time 08/12/14  4599 History   First MD Initiated Contact with Patient 08/12/14 [phone]     Chief Complaint  Patient presents with  . Chest Pain   HPI Patient presents to the emergency room with complaints of left-sided aching chest pain. The patient has a history of coronary artery disease. Over the last couple of weeks he has had intermittent episodes of chest pain these episodes last somewhere between 20-40 minutes at a time. He does not feel short of breath when they occur. He does not feel nauseated. The pain does not radiate.  Patient saw his cardiologist this past week for the same symptoms. He is scheduled to have a cardiac catheterization on Tuesday. Patient was told if he has any recurrent symptoms to come to the emergency department. This morning he started having symptoms again. There is similar to the previous episodes. He cannot think of anything that triggers them. They seem to occur at rest as well as mild exertion. Past Medical History  Diagnosis Date  . Venous thromboembolism     DVTs in 2004, 2005, 2007, 8/10, 8/11. IVC filter 2005. Off coumadin 04/2011 secondary to rectal bleeding  . 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 Chronic venous insufficiency   . DM2 (diabetes mellitus, type 2)   . GERD (gastroesophageal reflux disease)   . BPH (benign prostatic hypertrophy)   . Low back pain   . HTN (hypertension)   . HLD (hyperlipidemia)   . NICM (nonischemic cardiomyopathy) 1. 9/11  2. 2/12    1. Echo septal and apical akinesis, dilated LV, EF 45%, RV normal size and systolic function, EF 43% on Myoview  2. Admitted with decompensated CHF, echo EF 30-35% with diffuse hypokinesis but akinesis of mid to apical anteroseptal wall and apex, moderate LVH, mild MR, grade I diastolic dysfunction, SPEP/UP negative and HIV negative. Most recent EF 10-15% 04/2011  . Smoking   . CAD (coronary artery disease) 1. 2007  2. 10/11    1.  Left heart cath with 40-50% stenosis in small RCA EF 50%  2. Lexiscan myoview EF 43%, global hypokinesis, possible small area of apical ischemia; LHC no angiographic CAD, no LV-gram done due to CKD   2. No angiographic CAD by cath 02/2010  . CKD (chronic kidney disease) stage 2, GFR 60-89 ml/min as of 02/2011  . Headache(784.0)   . Diabetes mellitus   . Blood transfusion   . Anemia   . Syncope     1. in setting of presumed orthostatic Hypotn 09/2011  . Right fibular fracture     1. 09/2011 - resulting from fall/syncope  . Closed right ankle fracture 10/10/2011   Past Surgical History  Procedure Laterality Date  . Cardiac catheterization    . Appendectomy    . Cholecystectomy    . Colonoscopy  05/01/2011    Procedure: COLONOSCOPY;  Surgeon: Louis Meckel, MD;  Location: [facility] ENDOSCOPY;  Service: Endoscopy;  Laterality: N/A;  . Orif ankle fracture  10/10/2011    Procedure: OPEN REDUCTION INTERNAL FIXATION (ORIF) ANKLE FRACTURE;  Surgeon: Eulas Post, MD;  Location: [facility] OR;  Service: Orthopedics;  Laterality: Right;  Right  Ankle Fracture ORIF  . Right heart catheterization N/A 06/25/2011    Procedure: RIGHT HEART CATH;  Surgeon: Dolores Patty, MD;  Location: [facility] CATH LAB;  Service: Cardiovascular;  Laterality: N/A;  . Implantable cardioverter defibrillator implant  N/A 06/13/2013    Procedure: IMPLANTABLE CARDIOVERTER DEFIBRILLATOR IMPLANT;  Surgeon: Marinus Maw, MD;  Location: [facility] CATH LAB;  Service: Cardiovascular;  Laterality: N/A;   Family History  Problem Relation Age of Onset  . Coronary artery disease Neg Hx     Premature   History  Substance Use Topics  . Smoking status: Current Some Day Smoker -- 1.00 packs/day for 40 years    Types: Cigarettes    Last Attempt to Quit: 06/30/2011  . Smokeless tobacco: Never Used  . Alcohol Use: Yes     Comment: occasionally    Review of Systems  All other systems reviewed and are negative.     Allergies  Review of patient's  allergies indicates no known allergies.  Home Medications   Prior to Admission medications   Medication Sig Start Date End Date Taking? Authorizing Provider  allopurinol (ZYLOPRIM) 100 MG tablet Take 1 tablet (100 mg total) by mouth daily. 08/09/14   Dolores Patty, MD  apixaban (ELIQUIS) 2.5 MG TABS tablet Take 1 tablet (2.5 mg total) by mouth 2 (two) times daily. 08/10/14   Dolores Patty, MD  atorvastatin (LIPITOR) 20 MG tablet Take 20 mg by mouth every morning.     Historical Provider, MD  digoxin (LANOXIN) 0.125 MG tablet Take 0.125 mg by mouth daily.    Historical Provider, MD  eplerenone (INSPRA) 25 MG tablet Take 25 mg by mouth daily.    Historical Provider, MD  esomeprazole (NEXIUM) 40 MG capsule Take 40 mg by mouth daily at 12 noon.    Historical Provider, MD  famotidine (PEPCID) 20 MG tablet Take 20 mg by mouth daily.    Historical Provider, MD  hydrALAZINE (APRESOLINE) 10 MG tablet Take 10 mg by mouth 3 (three) times daily.  12/09/12   Amy D Filbert Schilder, NP  HYDROcodone-acetaminophen (NORCO/VICODIN) 5-325 MG per tablet Take 1 tablet by mouth every 6 (six) hours as needed. 03/01/14   Roxy Horseman, PA-C  Insulin Glargine (LANTUS SOLOSTAR) 100 UNIT/ML Solostar Pen Inject 25 Units into the skin 2 (two) times daily. 04/29/14   Clydia Llano, MD  isosorbide mononitrate (IMDUR) 30 MG 24 hr tablet Take 30 mg by mouth daily. 12/09/12   Amy D Clegg, NP  lisinopril (PRINIVIL,ZESTRIL) 5 MG tablet Take 5 mg by mouth every evening.    Historical Provider, MD  nateglinide (STARLIX) 60 MG tablet Take 120 mg by mouth 3 (three) times daily.  07/29/13   Historical Provider, MD  tamsulosin (FLOMAX) 0.4 MG CAPS capsule Take 0.4 mg by mouth daily after supper.    Historical Provider, MD  torsemide (DEMADEX) 20 MG tablet Take 20 mg by mouth every evening.    Historical Provider, MD   BP 117/85 mmHg  Pulse 113  Temp(Src) 97.8 F (36.6 C) (Oral)  Resp 27  SpO2 99% Physical Exam  Constitutional: No  distress.  Thin  HENT:  Head: Normocephalic and atraumatic.  Right Ear: External ear normal.  Left Ear: External ear normal.  Eyes: Conjunctivae are normal. Right eye exhibits no discharge. Left eye exhibits no discharge. No scleral icterus.  Neck: Neck supple. No tracheal deviation present.  Cardiovascular: Normal rate, regular rhythm and intact distal pulses.   Pulmonary/Chest: Effort normal and breath sounds normal. No stridor. No respiratory distress. He has no wheezes. He has no rales.  Abdominal: Soft. Bowel sounds are normal. He exhibits no distension. There is no tenderness. There is no rebound and no guarding.  Musculoskeletal: He exhibits  no edema or tenderness.  Neurological: He is alert. He has normal strength. No cranial nerve deficit (no facial droop, extraocular movements intact, no slurred speech) or sensory deficit. He exhibits normal muscle tone. He displays no seizure activity. Coordination normal.  Skin: Skin is warm and dry. No rash noted.  Psychiatric: He has a normal mood and affect.  Nursing note and vitals reviewed.   ED Course  Procedures (including critical care time) Labs Review Labs Reviewed  CBC - Abnormal; Notable for the following:    Platelets 139 (*)    All other components within normal limits  COMPREHENSIVE METABOLIC PANEL - Abnormal; Notable for the following:    Glucose, Bld 201 (*)    BUN 36 (*)    Creatinine, Ser 2.32 (*)    AST 98 (*)    ALT 175 (*)    Alkaline Phosphatase 153 (*)    Total Bilirubin 1.7 (*)    GFR calc non Af Amer 28 (*)    GFR calc Af Amer 32 (*)    All other components within normal limits  PROTIME-INR - Abnormal; Notable for the following:    Prothrombin Time 18.0 (*)    All other components within normal limits  APTT  I-STAT TROPOININ, ED    Imaging Review Dg Chest 2 View  08/12/2014   CLINICAL DATA:  Substernal chest pain. History of coronary artery disease  EXAM: CHEST  2 VIEW  COMPARISON:  May 15, 2014  chest radiograph; July 20, 2014 chest CT  FINDINGS: There is minimal atelectasis in the left base. Lungs are otherwise clear. The heart is mildly enlarged with pulmonary vascularity within normal limits. Pacemaker lead is attached to the right ventricle. No pneumothorax. No adenopathy. There is mild degenerative change in the thoracic spine.  IMPRESSION: Minimal atelectasis left base. No edema or consolidation. Cardiomegaly with pacemaker present.   Electronically Signed   By: Bretta Bang III M.D.   On: 08/12/2014 10:32     EKG Interpretation   Date/Time:  Saturday August 12 2014 09:28:43 EDT Ventricular Rate:  113 PR Interval:  148 QRS Duration: 88 QT Interval:  335 QTC Calculation: 459 R Axis:   58 Text Interpretation:  Sinus tachycardia Probable LVH with secondary repol  abnrm No significant change since last tracing Confirmed by Reagan Behlke  MD-J,  Samiyah Stupka ([id number]) on 08/12/2014 9:40:54 AM      MDM   Final diagnoses:  Chest pain, unspecified chest pain type    Pt has history of CHF.  Also with history of DVT and LV thrombus.  Dr Lorrin Jackson discussed aggressive treatment versus palliative.  Pt still wants aggressive tx.   Told to come to the ED if symptoms persisted.  Will consult cardiology for further recommendations.  No signs of pulm edema  Pt was monitored in the ED.  Dr Graciela Husbands evaluated the patient.  Pt would like to go home now.  He will follow up closely in the heart failure clinic   Linwood Dibbles, MD 08/12/14 [phone]

## 2014-08-12 NOTE — ED Notes (Signed)
Patient transported to X-ray without distress.  

## 2014-08-12 NOTE — ED Notes (Signed)
PT given [place] sandwich and sugar free cookies. Pt is happy for moment and decided to stay.

## 2014-08-14 ENCOUNTER — Telehealth (HOSPITAL_COMMUNITY): Payer: Self-pay | Admitting: Cardiology

## 2014-08-14 NOTE — Telephone Encounter (Signed)
Pt scheduled for RHc on 08/15/2014 with Dr. Gala Romney With pts current insurance- [employer] Pre [phone] Cpt code 59563

## 2014-08-15 ENCOUNTER — Ambulatory Visit (HOSPITAL_COMMUNITY)
Admission: RE | Admit: 2014-08-15 | Discharge: 2014-08-15 | Disposition: A | Discharge status: Home / Self Care | Payer: Medicare HMO | Source: Ambulatory Visit | Attending: Internal Medicine | Admitting: Internal Medicine

## 2014-08-15 ENCOUNTER — Encounter (HOSPITAL_COMMUNITY): Payer: Self-pay | Admitting: Internal Medicine

## 2014-08-15 ENCOUNTER — Encounter (HOSPITAL_COMMUNITY)
Admission: RE | Disposition: A | Discharge status: Home / Self Care | Payer: Self-pay | Source: Ambulatory Visit | Attending: Internal Medicine

## 2014-08-15 DIAGNOSIS — N189 Chronic kidney disease, unspecified: Secondary | ICD-10-CM | POA: Insufficient documentation

## 2014-08-15 DIAGNOSIS — D649 Anemia, unspecified: Secondary | ICD-10-CM | POA: Insufficient documentation

## 2014-08-15 DIAGNOSIS — I272 Other secondary pulmonary hypertension: Secondary | ICD-10-CM | POA: Diagnosis not present

## 2014-08-15 DIAGNOSIS — F17210 Nicotine dependence, cigarettes, uncomplicated: Secondary | ICD-10-CM | POA: Insufficient documentation

## 2014-08-15 DIAGNOSIS — N400 Enlarged prostate without lower urinary tract symptoms: Secondary | ICD-10-CM | POA: Insufficient documentation

## 2014-08-15 DIAGNOSIS — E119 Type 2 diabetes mellitus without complications: Secondary | ICD-10-CM | POA: Insufficient documentation

## 2014-08-15 DIAGNOSIS — E785 Hyperlipidemia, unspecified: Secondary | ICD-10-CM | POA: Insufficient documentation

## 2014-08-15 DIAGNOSIS — Z794 Long term (current) use of insulin: Secondary | ICD-10-CM | POA: Diagnosis not present

## 2014-08-15 DIAGNOSIS — I2510 Atherosclerotic heart disease of native coronary artery without angina pectoris: Secondary | ICD-10-CM | POA: Insufficient documentation

## 2014-08-15 DIAGNOSIS — K219 Gastro-esophageal reflux disease without esophagitis: Secondary | ICD-10-CM | POA: Diagnosis not present

## 2014-08-15 DIAGNOSIS — Z86718 Personal history of other venous thrombosis and embolism: Secondary | ICD-10-CM | POA: Diagnosis not present

## 2014-08-15 DIAGNOSIS — I5022 Chronic systolic (congestive) heart failure: Secondary | ICD-10-CM | POA: Insufficient documentation

## 2014-08-15 DIAGNOSIS — I428 Other cardiomyopathies: Secondary | ICD-10-CM | POA: Insufficient documentation

## 2014-08-15 DIAGNOSIS — I129 Hypertensive chronic kidney disease with stage 1 through stage 4 chronic kidney disease, or unspecified chronic kidney disease: Secondary | ICD-10-CM | POA: Diagnosis not present

## 2014-08-15 DIAGNOSIS — Z950 Presence of cardiac pacemaker: Secondary | ICD-10-CM | POA: Diagnosis not present

## 2014-08-15 HISTORY — PX: RIGHT HEART CATHETERIZATION: SHX5447

## 2014-08-15 LAB — POCT I-STAT 3, VENOUS BLOOD GAS (G3P V)
ACID-BASE DEFICIT: 7 mmol/L — AB (ref 0.0–2.0)
Acid-base deficit: 6 mmol/L — ABNORMAL HIGH (ref 0.0–2.0)
Acid-base deficit: 7 mmol/L — ABNORMAL HIGH (ref 0.0–2.0)
BICARBONATE: 18.4 meq/L — AB (ref 20.0–24.0)
Bicarbonate: 18.4 mEq/L — ABNORMAL LOW (ref 20.0–24.0)
Bicarbonate: 18.6 mEq/L — ABNORMAL LOW (ref 20.0–24.0)
O2 SAT: 58 %
O2 SAT: 63 %
O2 Saturation: 59 %
PCO2 VEN: 35.3 mmHg — AB (ref 45.0–50.0)
PH VEN: 7.314 — AB (ref 7.250–7.300)
PH VEN: 7.33 — AB (ref 7.250–7.300)
PH VEN: 7.332 — AB (ref 7.250–7.300)
PO2 VEN: 33 mmHg (ref 30.0–45.0)
PO2 VEN: 35 mmHg (ref 30.0–45.0)
TCO2: 19 mmol/L (ref 0–100)
TCO2: 19 mmol/L (ref 0–100)
TCO2: 20 mmol/L (ref 0–100)
pCO2, Ven: 34.7 mmHg — ABNORMAL LOW (ref 45.0–50.0)
pCO2, Ven: 36.1 mmHg — ABNORMAL LOW (ref 45.0–50.0)
pO2, Ven: 33 mmHg (ref 30.0–45.0)

## 2014-08-15 LAB — GLUCOSE, CAPILLARY
GLUCOSE-CAPILLARY: 142 mg/dL — AB (ref 70–99)
Glucose-Capillary: 131 mg/dL — ABNORMAL HIGH (ref 70–99)

## 2014-08-15 SURGERY — RIGHT HEART CATH
Anesthesia: LOCAL

## 2014-08-15 MED ORDER — SODIUM CHLORIDE 0.9 % IV SOLN
INTRAVENOUS | Status: DC
  Administered 2014-08-15: 11:00:00 via INTRAVENOUS

## 2014-08-15 MED ORDER — FENTANYL CITRATE 0.05 MG/ML IJ SOLN
INTRAMUSCULAR | Status: AC
  Filled 2014-08-15: qty 2

## 2014-08-15 MED ORDER — LIDOCAINE HCL (PF) 1 % IJ SOLN
INTRAMUSCULAR | Status: AC
Start: 2014-08-15 — End: 2014-08-15
  Filled 2014-08-15: qty 30

## 2014-08-15 MED ORDER — HEPARIN (PORCINE) IN NACL 2-0.9 UNIT/ML-% IJ SOLN
INTRAMUSCULAR | Status: AC
  Filled 2014-08-15: qty 500

## 2014-08-15 MED ORDER — SODIUM CHLORIDE 0.9 % IJ SOLN: 3.0000 mL | INTRAMUSCULAR | Status: DC | PRN

## 2014-08-15 MED ORDER — MIDAZOLAM HCL 2 MG/2ML IJ SOLN
INTRAMUSCULAR | Status: AC
  Filled 2014-08-15: qty 2

## 2014-08-15 MED ORDER — ACETAMINOPHEN 325 MG PO TABS: 650.0000 mg | ORAL_TABLET | ORAL | Status: DC | PRN

## 2014-08-15 MED ORDER — SODIUM CHLORIDE 0.9 % IJ SOLN
3.0000 mL | Freq: Two times a day (BID) | INTRAMUSCULAR | Status: DC
Start: 2014-08-16 — End: 2014-08-15

## 2014-08-15 MED ORDER — SODIUM CHLORIDE 0.9 % IV SOLN: 250.0000 mL | INTRAVENOUS | Status: DC | PRN

## 2014-08-15 MED ORDER — SODIUM CHLORIDE 0.9 % IJ SOLN: 3.0000 mL | Freq: Two times a day (BID) | INTRAMUSCULAR | Status: DC

## 2014-08-15 MED ORDER — ONDANSETRON HCL 4 MG/2ML IJ SOLN: 4.0000 mg | Freq: Four times a day (QID) | INTRAMUSCULAR | Status: DC | PRN

## 2014-08-15 NOTE — Progress Notes (Signed)
Patient states he has home health aid and does not drive and very insistent on going home.  Discharged home with his sister and brother in stable condition.  Please refer to previous note.

## 2014-08-15 NOTE — H&P (View-Only) (Signed)
Patient ID: [name], male   DOB: Oct 14, 1945, 69 y.o.   MRN: [id number]  PCP: Dr. Jeanelle Malling Osei-Bonsu Urologist: Dr Retta Diones EP: Dr. Ladona Ridgel  HPI:  [name] is a 69 year old male with chronic systolic CHF secondary to NICM echo EF 30-35%, HTN, anemia, chronic renal insufficiency, UGI bleed, duodenal AVMs,  rectal bleeding 04/2011,  DVTs (2004, 2005, 2007, 2010, 2011) however coumadin on hold due to GI bleed, IVC filter 2005, GERD, BPD, Urinary Retention, and chronic low back.  09/2011 S/P ORIF L ankle. S/P ICD January 2015.  Spironolactone switched to eplerenone due to gynecomastia.  Admitted from HF clinic 05/30/2012 with volume overload. Diuresed with Milrinone and Lasix. Diuresed 41 pounds. Milrinone and lasix gtt weaned off. CO-OX dropped 43. Beta blocker was stopped due to low output. He was offered home Milrinone however he declined.  Discharge weight 175 pounds.  Admitted to [facility] 12/28 through 12/30 with uncontrolled DM due to noncompliance. Discharge weight  134 pounds.   Follow up for Heart Failure:  He is being seen today because his of change noted on ECHO.  Says he feels ok. Denies SOB/PND/Orthopnea. Has to take rest breaks with ADLs. Weight at home 134-140 pounds. Appetite ok. Living at home alone. Smokes 1/2 ppd.   08/13/11 CPX  RER 1.16 Peak VO2 18.6 ml/kg/min Predicted 61.4% VE/VCO2 slope 42.8 08/23/2011 ECHO EF 30-35% 05/09/13 ECHO EF 20-25% RV mod-severely hypokinetic 08/10/2014: EF 10% Moderate to severe RV dysfunction LV thrombus   Labs 02/23/13 K 3.9 Creatinine 1.58 Dig level 0.9  Labs 05/31/13 K 4.0 Creatinine  1.6  Labs 06/05/14 K 3.7 Creatinine 1.39 hgb 15 Labs 07/17/2014: K 3.7 Creatinine 1.89   SH: Lives alone, occasional smoker now, no ETOH or drugs FH: Mother deceased: stroke, HTN,         Father deceased: no issues, died from intracranial bleed from fall        4 sisters and 3 brothers: no CAD   ROS: All systems negative except as listed in HPI, PMH and  Problem List.  Past Medical History  Diagnosis Date  . Venous thromboembolism     DVTs in 2004, 2005, 2007, 8/10, 8/11. IVC filter 2005. Off coumadin 04/2011 secondary to rectal bleeding  . 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 Chronic venous insufficiency   . DM2 (diabetes mellitus, type 2)   . GERD (gastroesophageal reflux disease)   . BPH (benign prostatic hypertrophy)   . Low back pain   . HTN (hypertension)   . HLD (hyperlipidemia)   . NICM (nonischemic cardiomyopathy) 1. 9/11  2. 2/12    1. Echo septal and apical akinesis, dilated LV, EF 45%, RV normal size and systolic function, EF 43% on Myoview  2. Admitted with decompensated CHF, echo EF 30-35% with diffuse hypokinesis but akinesis of mid to apical anteroseptal wall and apex, moderate LVH, mild MR, grade I diastolic dysfunction, SPEP/UP negative and HIV negative. Most recent EF 10-15% 04/2011  . Smoking   . CAD (coronary artery disease) 1. 2007  2. 10/11    1. Left heart cath with 40-50% stenosis in small RCA EF 50%  2. Lexiscan myoview EF 43%, global hypokinesis, possible small area of apical ischemia; LHC no angiographic CAD, no LV-gram done due to CKD   2. No angiographic CAD by cath 02/2010  . CKD (chronic kidney disease) stage 2, GFR 60-89 ml/min as of 02/2011  . Headache(784.0)   .  Diabetes mellitus   . Blood transfusion   . Anemia   . Syncope     1. in setting of presumed orthostatic Hypotn 09/2011  . Right fibular fracture     1. 09/2011 - resulting from fall/syncope  . Closed right ankle fracture 10/10/2011    Current Outpatient Prescriptions  Medication Sig Dispense Refill  . allopurinol (ZYLOPRIM) 100 MG tablet Take 1 tablet (100 mg total) by mouth daily. 90 tablet 3  . atorvastatin (LIPITOR) 20 MG tablet Take 20 mg by mouth every morning.     . digoxin (LANOXIN) 0.125 MG tablet Take 0.125 mg by mouth daily.    Marland Kitchen eplerenone (INSPRA) 25 MG tablet Take 25 mg by mouth daily.    Marland Kitchen  esomeprazole (NEXIUM) 40 MG capsule Take 40 mg by mouth daily at 12 noon.    . famotidine (PEPCID) 20 MG tablet Take 20 mg by mouth daily.    . hydrALAZINE (APRESOLINE) 10 MG tablet Take 10 mg by mouth 3 (three) times daily.     Marland Kitchen HYDROcodone-acetaminophen (NORCO/VICODIN) 5-325 MG per tablet Take 1 tablet by mouth every 6 (six) hours as needed. 10 tablet 0  . Insulin Glargine (LANTUS SOLOSTAR) 100 UNIT/ML Solostar Pen Inject 25 Units into the skin 2 (two) times daily.    . isosorbide mononitrate (IMDUR) 30 MG 24 hr tablet Take 30 mg by mouth daily.    Marland Kitchen lisinopril (PRINIVIL,ZESTRIL) 5 MG tablet Take 5 mg by mouth every evening.    . metoprolol succinate (TOPROL-XL) 25 MG 24 hr tablet Take 0.5 tablets (12.5 mg total) by mouth 2 (two) times daily. 60 tablet 3  . nateglinide (STARLIX) 60 MG tablet Take 120 mg by mouth 3 (three) times daily.     . tamsulosin (FLOMAX) 0.4 MG CAPS capsule Take 0.4 mg by mouth daily after supper.    . torsemide (DEMADEX) 20 MG tablet Take 20 mg by mouth every evening.     No current facility-administered medications for this encounter.    Filed Vitals:   08/10/14 1519  BP: 82/54  Pulse: 119  Weight: 138 lb (62.596 kg)  SpO2: 98%   PHYSICAL EXAM: General: Chronically ill appearing.  No acute distress.  HEENT: normal except for poor dentition Neck: supple. JVP flat Carotids 2+ bilaterally; no bruits. No lymphadenopathy or thryomegaly appreciated. Cor: PMI displaced laterally. Tachy Regular rate and rhythm.  Lungs: clear Abdomen: Non tender. Good bowel sounds. Non distended Extremities: no cyanosis, clubbing, rash,edema Neuro: alert & orientedx3, cranial nerves grossly intact. Moves all 4 extremities w/o difficulty. Affect pleasant.    ASSESSMENT & PLAN:  1) Chronic systolic HF:  NICM s/p [employer] Jude ICD,  ECHO 10% with moderate to severe RV HK today with LV thrombus noted.   NYHA IIIb . Volume status ok. Today he is tachycardic and concerning for low  output. Stop toprol 25 bid.   - Continue Eplerenone 25 mg daily.  Had gynecomastia on Spiro.  - Hydralazine 10 mg TID. Continue Imdur 30 mg daily  - Continue digoxin 0.125 mg daily, last dig level <0.2 (04/2014) - Continue lisinopril 5 mg daily at night.  Discussed advanced therapies. He is not a candidate for heart transplant due to nicotine. Offered RHC to assess hemodynamics.  Offered hospital admit today however he would like to wait until Monday.  2) CRI stage 3  - Baseline creatinine 1.5-1.8.   3) Recurrent DVTs - No signs or symptoms of DVT - Not on Coumadin d/t hx  GIB and he refuses all antocoagulants 4) DM2- Continue to follow with PCP.  5) Tobacco Abuse- Smoking 1 pack every 2-3 days.  6) Weight loss-  7) LV thrombus noted today on ECHO- needs anticoagulation. Refuses coumadin/ He is agreeable to start 2.5 mg eliquis twice a day which will also cover recurrent DVTs based on [employer] -EXT data.   CLEGG,AMY, NP-C  3:47 PM  Patient seen and examined with [name], NP. We discussed all aspects of the encounter. I agree with the assessment and plan as stated above.   Echo reviewed personally. He continues to struggle with worsening functional status. With his smoking history and profound weight loss, I was initially concerned about a malignancy but CT C/A/P were ok. Echo today with severe biventricular dysfunction and LV clot. We had a long talk about options particularly with regard to hospice versus aggressive rx. He would like aggressive rx. Not tx or VAD candidate currently. Will scheduled RHC next week with consideration of milrinone. Stop toprol. Start Eliquis.   Total time spent 45 minutes. Over half that time spent discussing above.   Daniel Bensimhon,MD 4:44 PM

## 2014-08-15 NOTE — Discharge Instructions (Signed)
Venogram, Care After Refer to this sheet in the next few weeks. These instructions provide you with information on caring for yourself after your procedure. Your health care provider may also give you more specific instructions. Your treatment has been planned according to current medical practices, but problems sometimes occur. Call your health care provider if you have any problems or questions after your procedure. WHAT TO EXPECT AFTER THE PROCEDURE After your procedure, it is typical to have the following sensations:  Mild discomfort at the catheter insertion site. HOME CARE INSTRUCTIONS   Take all medicines exactly as directed.  Follow any prescribed diet.  Follow instructions regarding both rest and physical activity.  Drink more fluids for the first several days after the procedure in order to help flush dye from your kidneys. SEEK MEDICAL CARE IF:  You develop a rash.  You have fever not controlled by medicine. SEEK IMMEDIATE MEDICAL CARE IF:  There is pain, drainage, bleeding, redness, swelling, warmth or a red streak at the site of the IV tube.  The extremity where your IV tube was placed becomes discolored, numb, or cool.  You have difficulty breathing or shortness of breath.  You develop chest pain.  If you start bleeding then hold pressure to the site for 20 minutes and if bleeding does not stop call 911.  You have excessive dizziness or fainting. Document Released: 02/23/2013 Document Revised: 05/10/2013 Document Reviewed: 02/23/2013 [facility] Patient Information 2015 [place], [place]. This information is not intended to replace advice given to you by your health care provider. Make sure you discuss any questions you have with your health care provider.

## 2014-08-15 NOTE — CV Procedure (Signed)
Cardiac Cath Procedure Note:  Indication:  HF  Procedures performed:  1) Right heart catheterization  Description of procedure:   The risks and indication of the procedure were explained. Consent was signed and placed on the chart. An appropriate timeout was taken prior to the procedure. The right neck was prepped and draped in the routine sterile fashion and anesthetized with 1% local lidocaine.   A 7 FR venous sheath was placed in the right internal jugular vein using u/s guidance and a modified Seldinger technique. A standard Swan-Ganz catheter was used for the procedure.   Complications: None apparent.  Findings:  RA = 9 RV = 49/1/15 PA = 50/20 (33) PCW = 15 Fick cardiac output/index = 4.4/2.4 Thermo CO/CI = 3.3/1.8 PVR = 3.9 WU Ao sat = 94% PA sat = 63%, 59% SVC sat = 58%  Assessment:  1. Mild pulmonary HTN 2. Normal left-sided filling pressures 3. Mildly depressed cardiac output by fick method, moderately to severely depressed by Thermo 4. No intracardiac shunting  Plan/Discussion:  Hemodynamics look pretty good by Fick; worse with thermo. Unsure why the discrepancy. I gave him the option of coming in for continued monitoring or going home with close outpatient f/u. He said he is feeling better and would prefer to go home today.   Arvilla Meres MD 2:00 PM

## 2014-08-15 NOTE — Interval H&P Note (Signed)
History and Physical Interval Note:  08/15/2014 1:58 PM  [name]  has presented today for surgery, with the diagnosis of chf  The various methods of treatment have been discussed with the patient and family. After consideration of risks, benefits and other options for treatment, the patient has consented to  Procedure(s): RIGHT HEART CATH (N/A) as a surgical intervention .  The patient's history has been reviewed, patient examined, no change in status, stable for surgery.  I have reviewed the patient's chart and labs.  Questions were answered to the patient's satisfaction.     Daniel Bensimhon

## 2014-08-29 ENCOUNTER — Encounter (HOSPITAL_COMMUNITY): Payer: Commercial Managed Care - HMO

## 2014-09-01 ENCOUNTER — Ambulatory Visit (HOSPITAL_COMMUNITY)
Admission: RE | Admit: 2014-09-01 | Discharge: 2014-09-01 | Disposition: A | Discharge status: Home / Self Care | Payer: Medicare HMO | Source: Ambulatory Visit | Attending: Cardiology | Admitting: Cardiology

## 2014-09-01 ENCOUNTER — Encounter (HOSPITAL_COMMUNITY): Payer: Self-pay

## 2014-09-01 VITALS — BP 102/70 | Wt 131.8 lb

## 2014-09-01 DIAGNOSIS — F17200 Nicotine dependence, unspecified, uncomplicated: Secondary | ICD-10-CM

## 2014-09-01 DIAGNOSIS — Z79899 Other long term (current) drug therapy: Secondary | ICD-10-CM | POA: Diagnosis not present

## 2014-09-01 DIAGNOSIS — Z794 Long term (current) use of insulin: Secondary | ICD-10-CM | POA: Insufficient documentation

## 2014-09-01 DIAGNOSIS — Z8249 Family history of ischemic heart disease and other diseases of the circulatory system: Secondary | ICD-10-CM | POA: Insufficient documentation

## 2014-09-01 DIAGNOSIS — Z7902 Long term (current) use of antithrombotics/antiplatelets: Secondary | ICD-10-CM | POA: Insufficient documentation

## 2014-09-01 DIAGNOSIS — Z823 Family history of stroke: Secondary | ICD-10-CM | POA: Insufficient documentation

## 2014-09-01 DIAGNOSIS — I129 Hypertensive chronic kidney disease with stage 1 through stage 4 chronic kidney disease, or unspecified chronic kidney disease: Secondary | ICD-10-CM | POA: Insufficient documentation

## 2014-09-01 DIAGNOSIS — I213 ST elevation (STEMI) myocardial infarction of unspecified site: Secondary | ICD-10-CM

## 2014-09-01 DIAGNOSIS — K219 Gastro-esophageal reflux disease without esophagitis: Secondary | ICD-10-CM | POA: Diagnosis not present

## 2014-09-01 DIAGNOSIS — I429 Cardiomyopathy, unspecified: Secondary | ICD-10-CM | POA: Diagnosis not present

## 2014-09-01 DIAGNOSIS — E119 Type 2 diabetes mellitus without complications: Secondary | ICD-10-CM | POA: Insufficient documentation

## 2014-09-01 DIAGNOSIS — Z720 Tobacco use: Secondary | ICD-10-CM

## 2014-09-01 DIAGNOSIS — N183 Chronic kidney disease, stage 3 (moderate): Secondary | ICD-10-CM | POA: Diagnosis not present

## 2014-09-01 DIAGNOSIS — F17210 Nicotine dependence, cigarettes, uncomplicated: Secondary | ICD-10-CM | POA: Insufficient documentation

## 2014-09-01 DIAGNOSIS — I5022 Chronic systolic (congestive) heart failure: Secondary | ICD-10-CM

## 2014-09-01 DIAGNOSIS — Z86718 Personal history of other venous thrombosis and embolism: Secondary | ICD-10-CM | POA: Insufficient documentation

## 2014-09-01 DIAGNOSIS — I513 Intracardiac thrombosis, not elsewhere classified: Secondary | ICD-10-CM

## 2014-09-01 LAB — BASIC METABOLIC PANEL
ANION GAP: 12 (ref 5–15)
BUN: 38 mg/dL — AB (ref 6–23)
CHLORIDE: 100 mmol/L (ref 96–112)
CO2: 26 mmol/L (ref 19–32)
CREATININE: 1.8 mg/dL — AB (ref 0.50–1.35)
Calcium: 9.9 mg/dL (ref 8.4–10.5)
GFR, EST AFRICAN AMERICAN: 44 mL/min — AB (ref >90)
GFR, EST NON AFRICAN AMERICAN: 38 mL/min — AB (ref >90)
Glucose, Bld: 225 mg/dL — ABNORMAL HIGH (ref 70–99)
POTASSIUM: 4.5 mmol/L (ref 3.5–5.1)
Sodium: 138 mmol/L (ref 135–145)

## 2014-09-01 LAB — CBC
HCT: 48.7 % (ref 39.0–52.0)
Hemoglobin: 16.1 g/dL (ref 13.0–17.0)
MCH: 30 pg (ref 26.0–34.0)
MCHC: 33.1 g/dL (ref 30.0–36.0)
MCV: 90.9 fL (ref 78.0–100.0)
PLATELETS: 91 10*3/uL — AB (ref 150–400)
RBC: 5.36 MIL/uL (ref 4.22–5.81)
RDW: 13.6 % (ref 11.5–15.5)
WBC: 4.5 10*3/uL (ref 4.0–10.5)

## 2014-09-01 LAB — BRAIN NATRIURETIC PEPTIDE: B Natriuretic Peptide: 1480 pg/mL — ABNORMAL HIGH (ref 0.0–100.0)

## 2014-09-01 MED ORDER — HYDRALAZINE HCL 10 MG PO TABS
20.0000 mg | ORAL_TABLET | Freq: Three times a day (TID) | ORAL | Status: DC
Start: 2014-09-01 — End: 2014-09-22

## 2014-09-01 NOTE — Patient Instructions (Addendum)
INCREASE Hydralazine to 20 mg, one pill three times per day  Labs today  Your physician recommends that you schedule a follow-up appointment in: 2 weeks  Do the following things EVERYDAY: 1) Weigh yourself in the morning before breakfast. Write it down and keep it in a log. 2) Take your medicines as prescribed 3) Eat low salt foods-Limit salt (sodium) to 2000 mg per day.  4) Stay as active as you can everyday 5) Limit all fluids for the day to less than 2 liters 6)

## 2014-09-03 NOTE — Progress Notes (Signed)
Patient ID: [name], male   DOB: 1945-06-27, 69 y.o.   MRN: [id number] PCP: Dr. Jeanelle Malling Osei-Bonsu Urologist: Dr Retta Diones EP: Dr. Ladona Ridgel  HPI:  [name] is a 69 year old male with chronic systolic CHF secondary to nonischemic cardiomyopathy, echo EF 30-35%, HTN, anemia, CKD, UGI bleed, duodenal AVMs,  rectal bleeding 04/2011,  DVTs (2004, 2005, 2007, 2010, 2011) however coumadin stopped due to GI bleed (now on apixaban), IVC filter 2005, GERD, BPD, Urinary Retention, and chronic low back.  09/2011 S/P ORIF L ankle. S/P ICD January 2015.  Spironolactone switched to eplerenone due to gynecomastia.  Admitted from HF clinic 05/30/2012 with volume overload. Diuresed with Milrinone and Lasix. Diuresed 41 pounds. Milrinone and lasix gtt weaned off. CO-OX dropped 43. Beta blocker was stopped due to low output. He was offered home Milrinone however he declined.  Discharge weight 175 pounds.  Admitted to [facility] 12/28 through 05/17/14 with uncontrolled DM due to noncompliance. Discharge weight  134 pounds.   RHC (3/16) with mean RA 9, PA 50/20 mean 33, mean PCWP 15, CI 2.4 Fick 1.8 thermo, PVR 3.9 => mild pulmonary hypertension and normal left-sided filling pressure, cardiac output mild to moderately low by Fick but more severely depressed by thermo.   Follow up for Heart Failure:  He was started on Eliquis last appointment at low dose given LV thrombus (GI bleeding with coumadin and refused to retry).  HR elevated today, sinus tachycardia.  Despite low output (at least by thermodilution) on last RHC, [name] says he is feeling fine.  He says he can walk as far as he wants on flat ground, has some dyspnea with steps.  No chest pain, no fatigue, no lightheadedness, no BRBPR or melena.  Living at home alone. Smokes 1/2 ppd. Medication list says he is taking torsemide 20 mg daily + metolazone once daily.  He did not bring his meds and is not sure whether or not he uses metolazone.  Weight is down 7 lbs.     08/13/11 CPX  RER 1.16 Peak VO2 18.6 ml/kg/min Predicted 61.4% VE/VCO2 slope 42.8 08/23/2011 ECHO EF 30-35% 05/09/13 ECHO EF 20-25% RV mod-severely hypokinetic 08/10/2014: EF 10% Moderate to severe RV dysfunction LV thrombus  ECG: sinus tachycardia, LVH with repolarization.    Labs 02/23/13 K 3.9 Creatinine 1.58 Dig level 0.9  Labs 05/31/13 K 4.0 Creatinine  1.6  Labs 06/05/14 K 3.7 Creatinine 1.39 hgb 15 Labs 07/17/2014: K 3.7 Creatinine 1.89  Labs 3/16: K 4.2, creatinine 2.32, HCT 39.4  SH: Lives alone, occasional smoker now, no ETOH or drugs  FH: Mother deceased: stroke, HTN,         Father deceased: no issues, died from intracranial bleed from fall        4 sisters and 3 brothers: no CAD   ROS: All systems negative except as listed in HPI, PMH and Problem List.  Past Medical History  Diagnosis Date  . Venous thromboembolism     DVTs in 2004, 2005, 2007, 8/10, 8/11. IVC filter 2005. Off coumadin 04/2011 secondary to rectal bleeding  . 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 Chronic venous insufficiency   . DM2 (diabetes mellitus, type 2)   . GERD (gastroesophageal reflux disease)   . BPH (benign prostatic hypertrophy)   . Low back pain   . HTN (hypertension)   . HLD (hyperlipidemia)   . NICM (nonischemic cardiomyopathy) 1. 9/11  2. 2/12  1. Echo septal and apical akinesis, dilated LV, EF 45%, RV normal size and systolic function, EF 43% on Myoview  2. Admitted with decompensated CHF, echo EF 30-35% with diffuse hypokinesis but akinesis of mid to apical anteroseptal wall and apex, moderate LVH, mild Timothy, grade I diastolic dysfunction, SPEP/UP negative and HIV negative. Most recent EF 10-15% 04/2011  . Smoking   . CAD (coronary artery disease) 1. 2007  2. 10/11    1. Left heart cath with 40-50% stenosis in small RCA EF 50%  2. Lexiscan myoview EF 43%, global hypokinesis, possible small area of apical ischemia; LHC no angiographic CAD, no LV-gram done due  to CKD   2. No angiographic CAD by cath 02/2010  . CKD (chronic kidney disease) stage 2, GFR 60-89 ml/min as of 02/2011  . Headache(784.0)   . Diabetes mellitus   . Blood transfusion   . Anemia   . Syncope     1. in setting of presumed orthostatic Hypotn 09/2011  . Right fibular fracture     1. 09/2011 - resulting from fall/syncope  . Closed right ankle fracture 10/10/2011    Current Outpatient Prescriptions  Medication Sig Dispense Refill  . acetaminophen (TYLENOL) 500 MG tablet Take 1,000 mg by mouth every 6 (six) hours as needed.    Marland Kitchen allopurinol (ZYLOPRIM) 100 MG tablet Take 1 tablet (100 mg total) by mouth daily. 90 tablet 3  . apixaban (ELIQUIS) 2.5 MG TABS tablet Take 1 tablet (2.5 mg total) by mouth 2 (two) times daily. 60 tablet 0  . atorvastatin (LIPITOR) 40 MG tablet Take 40 mg by mouth daily at 6 PM.     . clobetasol cream (TEMOVATE) 0.05 % Apply 1 application topically 2 (two) times daily.     . digoxin (LANOXIN) 0.125 MG tablet Take 0.125 mg by mouth daily.    Marland Kitchen eplerenone (INSPRA) 25 MG tablet Take 25 mg by mouth daily.    Marland Kitchen esomeprazole (NEXIUM) 40 MG capsule Take 40 mg by mouth daily at 12 noon.    . famotidine (PEPCID) 20 MG tablet Take 20 mg by mouth daily.    . hydrALAZINE (APRESOLINE) 10 MG tablet Take 2 tablets (20 mg total) by mouth 3 (three) times daily. 180 tablet 3  . HYDROcodone-acetaminophen (NORCO/VICODIN) 5-325 MG per tablet Take 1 tablet by mouth every 6 (six) hours as needed. 10 tablet 0  . Insulin Glargine (LANTUS SOLOSTAR) 100 UNIT/ML Solostar Pen Inject 25 Units into the skin 2 (two) times daily. (Patient taking differently: Inject 20 Units into the skin 2 (two) times daily. )    . isosorbide mononitrate (IMDUR) 30 MG 24 hr tablet Take 30 mg by mouth daily.    Marland Kitchen lisinopril (PRINIVIL,ZESTRIL) 5 MG tablet Take 5 mg by mouth every evening.    . metolazone (ZAROXOLYN) 2.5 MG tablet Take 2.5 mg by mouth daily.     . metoprolol succinate (TOPROL-XL) 25 MG 24 hr  tablet Take 12.5 mg by mouth 2 (two) times daily.     . nateglinide (STARLIX) 120 MG tablet Take 120 mg by mouth 3 (three) times daily with meals.     Marland Kitchen OVER THE COUNTER MEDICATION Apply 1 application topically as needed (itching).    . pantoprazole (PROTONIX) 40 MG tablet Take 40 mg by mouth daily.     . tamsulosin (FLOMAX) 0.4 MG CAPS capsule Take 0.4 mg by mouth daily after supper.    . torsemide (DEMADEX) 20 MG tablet Take 20 mg by  mouth daily.      No current facility-administered medications for this encounter.    Filed Vitals:   09/01/14 1406  BP: 102/70  Weight: 131 lb 12.8 oz (59.784 kg)   PHYSICAL EXAM: General: Chronically ill appearing.  No acute distress.  HEENT: normal except for poor dentition Neck: supple. JVP 8 cm.  Carotids 2+ bilaterally; no bruits. No lymphadenopathy or thryomegaly appreciated. Cor: PMI displaced laterally. Tachy, regular rate and rhythm.  No murmur.  Lungs: clear Abdomen: Non tender. Good bowel sounds. Non distended Extremities: no cyanosis, clubbing, rash,edema Neuro: alert & orientedx3, cranial nerves grossly intact. Moves all 4 extremities w/o difficulty. Affect pleasant.  ASSESSMENT & PLAN:  1) Chronic systolic HF:  Nonischemic cardiomyopathy.  s/p [employer] ICD, ECHO 3/16 with EF 10% with moderate to severe RV HK and LV thrombus noted.  NYHA II-III (looks quite frail but does not report a lot of symptoms). Volume status looks ok. He remains tachycardic.  RHC in 3/16 suggested that filling pressures were not significantly elevated.  Difficult to interpret cardiac output => mild to moderately reduced by Fick, severely reduced by thermodilution.  He has refused home milrinone in the past.   Weight is down.   - Continue Eplerenone 25 mg daily.  Had gynecomastia on Spironolactone.  - Increase hydralazine to 20 mg TID. Continue Imdur 30 mg daily  - Continue digoxin 0.125 mg daily, last dig level <0.2 (04/2014) - Continue lisinopril 5 mg daily at  night.  - Continue Toprol XL XL 12.5 mg bid.   - Continue current torsemide.  He will need to let us know if he is taking daily metolazone.  If so, will need to come up with a better regimen.  - He is not a candidate for heart transplant due to nicotine. LVAD may be an option going forwards but I am concerned about his history of GI bleeding from small bowel AVMs.   2) CKD stage 3: Creatinine up to 2.3 most recently. We do need to figure out exactly what diuretic regimen he is using.  3) Recurrent DVTs: No signs or symptoms of DVT.  Not on Coumadin due to history of GIB. He was found in 3/16 to have LV thrombus.  He refused to restart coumadin but is on low dose Eliquis not . 4) DM2: Continue to follow with PCP.  5) Tobacco Abuse: Smoking 1 pack every 2-3 days.  6) LV thrombus: He is on Eliquis.  Recent CBC looked ok    Fransico Meadow 09/03/2014

## 2014-09-18 ENCOUNTER — Encounter (HOSPITAL_COMMUNITY): Payer: Self-pay | Admitting: Emergency Medicine

## 2014-09-18 ENCOUNTER — Emergency Department (HOSPITAL_COMMUNITY): Payer: Medicare HMO

## 2014-09-18 ENCOUNTER — Encounter (HOSPITAL_COMMUNITY): Payer: Commercial Managed Care - HMO

## 2014-09-18 ENCOUNTER — Inpatient Hospital Stay (HOSPITAL_COMMUNITY)
Admission: EM | Admit: 2014-09-18 | Discharge: 2014-09-22 | DRG: 291 | Disposition: A | Discharge status: Hospice | Payer: Medicare HMO | Attending: Internal Medicine | Admitting: Internal Medicine

## 2014-09-18 ENCOUNTER — Inpatient Hospital Stay (HOSPITAL_COMMUNITY): Payer: Medicare HMO

## 2014-09-18 DIAGNOSIS — Z7189 Other specified counseling: Secondary | ICD-10-CM | POA: Diagnosis not present

## 2014-09-18 DIAGNOSIS — I129 Hypertensive chronic kidney disease with stage 1 through stage 4 chronic kidney disease, or unspecified chronic kidney disease: Secondary | ICD-10-CM | POA: Diagnosis present

## 2014-09-18 DIAGNOSIS — I5033 Acute on chronic diastolic (congestive) heart failure: Secondary | ICD-10-CM | POA: Diagnosis present

## 2014-09-18 DIAGNOSIS — Z79899 Other long term (current) drug therapy: Secondary | ICD-10-CM | POA: Diagnosis not present

## 2014-09-18 DIAGNOSIS — E785 Hyperlipidemia, unspecified: Secondary | ICD-10-CM | POA: Diagnosis present

## 2014-09-18 DIAGNOSIS — R52 Pain, unspecified: Secondary | ICD-10-CM | POA: Diagnosis not present

## 2014-09-18 DIAGNOSIS — I513 Intracardiac thrombosis, not elsewhere classified: Secondary | ICD-10-CM | POA: Diagnosis present

## 2014-09-18 DIAGNOSIS — Z515 Encounter for palliative care: Secondary | ICD-10-CM

## 2014-09-18 DIAGNOSIS — K7200 Acute and subacute hepatic failure without coma: Secondary | ICD-10-CM | POA: Diagnosis present

## 2014-09-18 DIAGNOSIS — Z66 Do not resuscitate: Secondary | ICD-10-CM | POA: Diagnosis present

## 2014-09-18 DIAGNOSIS — E43 Unspecified severe protein-calorie malnutrition: Secondary | ICD-10-CM | POA: Diagnosis present

## 2014-09-18 DIAGNOSIS — Z86718 Personal history of other venous thrombosis and embolism: Secondary | ICD-10-CM | POA: Diagnosis not present

## 2014-09-18 DIAGNOSIS — I5023 Acute on chronic systolic (congestive) heart failure: Secondary | ICD-10-CM | POA: Diagnosis not present

## 2014-09-18 DIAGNOSIS — I82409 Acute embolism and thrombosis of unspecified deep veins of unspecified lower extremity: Secondary | ICD-10-CM | POA: Diagnosis not present

## 2014-09-18 DIAGNOSIS — Z95810 Presence of automatic (implantable) cardiac defibrillator: Secondary | ICD-10-CM | POA: Diagnosis present

## 2014-09-18 DIAGNOSIS — I959 Hypotension, unspecified: Secondary | ICD-10-CM | POA: Diagnosis not present

## 2014-09-18 DIAGNOSIS — I509 Heart failure, unspecified: Secondary | ICD-10-CM

## 2014-09-18 DIAGNOSIS — F17210 Nicotine dependence, cigarettes, uncomplicated: Secondary | ICD-10-CM | POA: Diagnosis present

## 2014-09-18 DIAGNOSIS — I428 Other cardiomyopathies: Secondary | ICD-10-CM | POA: Diagnosis present

## 2014-09-18 DIAGNOSIS — IMO0002 Reserved for concepts with insufficient information to code with codable children: Secondary | ICD-10-CM | POA: Diagnosis present

## 2014-09-18 DIAGNOSIS — R570 Cardiogenic shock: Secondary | ICD-10-CM | POA: Diagnosis present

## 2014-09-18 DIAGNOSIS — I2510 Atherosclerotic heart disease of native coronary artery without angina pectoris: Secondary | ICD-10-CM | POA: Diagnosis present

## 2014-09-18 DIAGNOSIS — I5043 Acute on chronic combined systolic (congestive) and diastolic (congestive) heart failure: Secondary | ICD-10-CM | POA: Diagnosis present

## 2014-09-18 DIAGNOSIS — N179 Acute kidney failure, unspecified: Secondary | ICD-10-CM | POA: Diagnosis present

## 2014-09-18 DIAGNOSIS — E860 Dehydration: Secondary | ICD-10-CM

## 2014-09-18 DIAGNOSIS — R634 Abnormal weight loss: Secondary | ICD-10-CM

## 2014-09-18 DIAGNOSIS — R0602 Shortness of breath: Secondary | ICD-10-CM

## 2014-09-18 DIAGNOSIS — I872 Venous insufficiency (chronic) (peripheral): Secondary | ICD-10-CM | POA: Diagnosis present

## 2014-09-18 DIAGNOSIS — E1165 Type 2 diabetes mellitus with hyperglycemia: Secondary | ICD-10-CM | POA: Diagnosis present

## 2014-09-18 DIAGNOSIS — R64 Cachexia: Secondary | ICD-10-CM | POA: Diagnosis present

## 2014-09-18 DIAGNOSIS — R531 Weakness: Secondary | ICD-10-CM

## 2014-09-18 DIAGNOSIS — R627 Adult failure to thrive: Secondary | ICD-10-CM | POA: Diagnosis present

## 2014-09-18 DIAGNOSIS — N189 Chronic kidney disease, unspecified: Secondary | ICD-10-CM

## 2014-09-18 DIAGNOSIS — I10 Essential (primary) hypertension: Secondary | ICD-10-CM | POA: Diagnosis not present

## 2014-09-18 DIAGNOSIS — Z720 Tobacco use: Secondary | ICD-10-CM | POA: Diagnosis present

## 2014-09-18 DIAGNOSIS — Z7951 Long term (current) use of inhaled steroids: Secondary | ICD-10-CM | POA: Diagnosis not present

## 2014-09-18 DIAGNOSIS — I5032 Chronic diastolic (congestive) heart failure: Secondary | ICD-10-CM | POA: Diagnosis present

## 2014-09-18 DIAGNOSIS — I240 Acute coronary thrombosis not resulting in myocardial infarction: Secondary | ICD-10-CM | POA: Diagnosis present

## 2014-09-18 DIAGNOSIS — K219 Gastro-esophageal reflux disease without esophagitis: Secondary | ICD-10-CM | POA: Diagnosis present

## 2014-09-18 DIAGNOSIS — E119 Type 2 diabetes mellitus without complications: Secondary | ICD-10-CM | POA: Diagnosis present

## 2014-09-18 DIAGNOSIS — Z452 Encounter for adjustment and management of vascular access device: Secondary | ICD-10-CM

## 2014-09-18 DIAGNOSIS — N184 Chronic kidney disease, stage 4 (severe): Secondary | ICD-10-CM | POA: Diagnosis present

## 2014-09-18 DIAGNOSIS — E876 Hypokalemia: Secondary | ICD-10-CM | POA: Diagnosis not present

## 2014-09-18 DIAGNOSIS — R7989 Other specified abnormal findings of blood chemistry: Secondary | ICD-10-CM

## 2014-09-18 DIAGNOSIS — I9589 Other hypotension: Secondary | ICD-10-CM | POA: Diagnosis not present

## 2014-09-18 DIAGNOSIS — R0600 Dyspnea, unspecified: Secondary | ICD-10-CM | POA: Diagnosis present

## 2014-09-18 DIAGNOSIS — E8770 Fluid overload, unspecified: Secondary | ICD-10-CM | POA: Diagnosis present

## 2014-09-18 DIAGNOSIS — R945 Abnormal results of liver function studies: Secondary | ICD-10-CM

## 2014-09-18 DIAGNOSIS — R778 Other specified abnormalities of plasma proteins: Secondary | ICD-10-CM

## 2014-09-18 DIAGNOSIS — F17200 Nicotine dependence, unspecified, uncomplicated: Secondary | ICD-10-CM | POA: Diagnosis present

## 2014-09-18 DIAGNOSIS — IMO0001 Reserved for inherently not codable concepts without codable children: Secondary | ICD-10-CM | POA: Diagnosis present

## 2014-09-18 LAB — BASIC METABOLIC PANEL
ANION GAP: 17 — AB (ref 5–15)
BUN: 77 mg/dL — AB (ref 6–20)
CHLORIDE: 102 mmol/L (ref 101–111)
CO2: 19 mmol/L — AB (ref 22–32)
Calcium: 10 mg/dL (ref 8.9–10.3)
Creatinine, Ser: 2.89 mg/dL — ABNORMAL HIGH (ref 0.61–1.24)
GFR, EST AFRICAN AMERICAN: 25 mL/min — AB (ref >60)
GFR, EST NON AFRICAN AMERICAN: 21 mL/min — AB (ref >60)
Glucose, Bld: 117 mg/dL — ABNORMAL HIGH (ref 70–99)
Potassium: 4.8 mmol/L (ref 3.5–5.1)
SODIUM: 138 mmol/L (ref 135–145)

## 2014-09-18 LAB — I-STAT TROPONIN, ED: Troponin i, poc: 0.25 ng/mL (ref 0.00–0.08)

## 2014-09-18 LAB — CBC
HCT: 47.2 % (ref 39.0–52.0)
Hemoglobin: 15.8 g/dL (ref 13.0–17.0)
MCH: 28.2 pg (ref 26.0–34.0)
MCHC: 33.5 g/dL (ref 30.0–36.0)
MCV: 84.1 fL (ref 78.0–100.0)
PLATELETS: 72 10*3/uL — AB (ref 150–400)
RBC: 5.61 MIL/uL (ref 4.22–5.81)
RDW: 13.7 % (ref 11.5–15.5)
WBC: 9.6 10*3/uL (ref 4.0–10.5)

## 2014-09-18 LAB — HEPATIC FUNCTION PANEL
ALK PHOS: 180 U/L — AB (ref 38–126)
ALT: 102 U/L — ABNORMAL HIGH (ref 17–63)
AST: 210 U/L — ABNORMAL HIGH (ref 15–41)
Albumin: 4 g/dL (ref 3.5–5.0)
BILIRUBIN DIRECT: 1.2 mg/dL — AB (ref 0.1–0.5)
Indirect Bilirubin: 1.7 mg/dL — ABNORMAL HIGH (ref 0.3–0.9)
TOTAL PROTEIN: 6.5 g/dL (ref 6.5–8.1)
Total Bilirubin: 2.9 mg/dL — ABNORMAL HIGH (ref 0.3–1.2)

## 2014-09-18 LAB — URINE MICROSCOPIC-ADD ON

## 2014-09-18 LAB — I-STAT CHEM 8, ED
BUN: 66 mg/dL — ABNORMAL HIGH (ref 6–20)
CHLORIDE: 105 mmol/L (ref 101–111)
CREATININE: 3 mg/dL — AB (ref 0.61–1.24)
Calcium, Ion: 1.12 mmol/L — ABNORMAL LOW (ref 1.13–1.30)
Glucose, Bld: 113 mg/dL — ABNORMAL HIGH (ref 70–99)
HEMATOCRIT: 55 % — AB (ref 39.0–52.0)
HEMOGLOBIN: 18.7 g/dL — AB (ref 13.0–17.0)
Potassium: 4.6 mmol/L (ref 3.5–5.1)
SODIUM: 137 mmol/L (ref 135–145)
TCO2: 17 mmol/L (ref 0–100)

## 2014-09-18 LAB — CARBOXYHEMOGLOBIN
CARBOXYHEMOGLOBIN: 1.3 % (ref 0.5–1.5)
METHEMOGLOBIN: 1.1 % (ref 0.0–1.5)
O2 Saturation: 57.6 %
Total hemoglobin: 14.8 g/dL (ref 13.5–18.0)

## 2014-09-18 LAB — URINALYSIS, ROUTINE W REFLEX MICROSCOPIC
Bilirubin Urine: NEGATIVE
GLUCOSE, UA: NEGATIVE mg/dL
Ketones, ur: NEGATIVE mg/dL
LEUKOCYTES UA: NEGATIVE
NITRITE: NEGATIVE
PROTEIN: NEGATIVE mg/dL
Specific Gravity, Urine: 1.019 (ref 1.005–1.030)
Urobilinogen, UA: 0.2 mg/dL (ref 0.0–1.0)
pH: 5 (ref 5.0–8.0)

## 2014-09-18 LAB — I-STAT CG4 LACTIC ACID, ED: LACTIC ACID, VENOUS: 3.72 mmol/L — AB (ref 0.5–2.0)

## 2014-09-18 LAB — MRSA PCR SCREENING: MRSA BY PCR: NEGATIVE

## 2014-09-18 LAB — GLUCOSE, CAPILLARY: GLUCOSE-CAPILLARY: 109 mg/dL — AB (ref 70–99)

## 2014-09-18 LAB — CBG MONITORING, ED: Glucose-Capillary: 106 mg/dL — ABNORMAL HIGH (ref 70–99)

## 2014-09-18 LAB — DIGOXIN LEVEL: Digoxin Level: <0.2 ng/mL — ABNORMAL LOW (ref 0.8–2.0)

## 2014-09-18 MED ORDER — ATORVASTATIN CALCIUM 40 MG PO TABS
40.0000 mg | ORAL_TABLET | Freq: Every day | ORAL | Status: DC
  Administered 2014-09-19: 40 mg via ORAL
  Filled 2014-09-18 (×2): qty 1

## 2014-09-18 MED ORDER — SODIUM CHLORIDE 0.9 % IV SOLN: INTRAVENOUS | Status: DC

## 2014-09-18 MED ORDER — ONDANSETRON HCL 4 MG/2ML IJ SOLN: 4.0000 mg | Freq: Four times a day (QID) | INTRAMUSCULAR | Status: DC | PRN

## 2014-09-18 MED ORDER — INSULIN GLARGINE 100 UNIT/ML SOLOSTAR PEN: 14.0000 [IU] | PEN_INJECTOR | Freq: Two times a day (BID) | SUBCUTANEOUS | Status: DC

## 2014-09-18 MED ORDER — GI COCKTAIL ~~LOC~~
30.0000 mL | Freq: Once | ORAL | Status: AC
  Administered 2014-09-18: 30 mL via ORAL
  Filled 2014-09-18: qty 30

## 2014-09-18 MED ORDER — DOCUSATE SODIUM 100 MG PO CAPS
100.0000 mg | ORAL_CAPSULE | Freq: Two times a day (BID) | ORAL | Status: DC
  Administered 2014-09-19 – 2014-09-22 (×4): 100 mg via ORAL
  Filled 2014-09-18 (×9): qty 1

## 2014-09-18 MED ORDER — ACETAMINOPHEN 650 MG RE SUPP: 650.0000 mg | Freq: Four times a day (QID) | RECTAL | Status: DC | PRN

## 2014-09-18 MED ORDER — ALBUTEROL SULFATE (2.5 MG/3ML) 0.083% IN NEBU: 2.5000 mg | INHALATION_SOLUTION | RESPIRATORY_TRACT | Status: DC | PRN

## 2014-09-18 MED ORDER — PANTOPRAZOLE SODIUM 40 MG PO TBEC
40.0000 mg | DELAYED_RELEASE_TABLET | Freq: Every day | ORAL | Status: DC
  Administered 2014-09-19 – 2014-09-20 (×2): 40 mg via ORAL
  Filled 2014-09-18 (×2): qty 1

## 2014-09-18 MED ORDER — SODIUM CHLORIDE 0.9 % IV BOLUS (SEPSIS)
1000.0000 mL | Freq: Once | INTRAVENOUS | Status: AC
  Administered 2014-09-18: 1000 mL via INTRAVENOUS

## 2014-09-18 MED ORDER — APIXABAN 2.5 MG PO TABS
2.5000 mg | ORAL_TABLET | Freq: Two times a day (BID) | ORAL | Status: DC
  Administered 2014-09-18 – 2014-09-20 (×4): 2.5 mg via ORAL
  Filled 2014-09-18 (×5): qty 1

## 2014-09-18 MED ORDER — PIPERACILLIN-TAZOBACTAM 3.375 G IVPB 30 MIN
3.3750 g | Freq: Once | INTRAVENOUS | Status: AC
  Administered 2014-09-18: 3.375 g via INTRAVENOUS
  Filled 2014-09-18: qty 50

## 2014-09-18 MED ORDER — TAMSULOSIN HCL 0.4 MG PO CAPS
0.4000 mg | ORAL_CAPSULE | Freq: Every day | ORAL | Status: DC
  Administered 2014-09-18 – 2014-09-19 (×2): 0.4 mg via ORAL
  Filled 2014-09-18 (×3): qty 1

## 2014-09-18 MED ORDER — ONDANSETRON HCL 4 MG PO TABS: 4.0000 mg | ORAL_TABLET | Freq: Four times a day (QID) | ORAL | Status: DC | PRN

## 2014-09-18 MED ORDER — SODIUM CHLORIDE 0.9 % IV SOLN
1000.0000 mL | Freq: Once | INTRAVENOUS | Status: AC
  Administered 2014-09-18: 1000 mL via INTRAVENOUS

## 2014-09-18 MED ORDER — INSULIN ASPART 100 UNIT/ML ~~LOC~~ SOLN
0.0000 [IU] | Freq: Three times a day (TID) | SUBCUTANEOUS | Status: DC
  Administered 2014-09-19: 2 [IU] via SUBCUTANEOUS

## 2014-09-18 MED ORDER — ALLOPURINOL 100 MG PO TABS
100.0000 mg | ORAL_TABLET | Freq: Every day | ORAL | Status: DC
  Administered 2014-09-19 – 2014-09-20 (×2): 100 mg via ORAL
  Filled 2014-09-18 (×2): qty 1

## 2014-09-18 MED ORDER — INSULIN GLARGINE 100 UNIT/ML ~~LOC~~ SOLN
14.0000 [IU] | Freq: Two times a day (BID) | SUBCUTANEOUS | Status: DC
  Administered 2014-09-19 (×2): 14 [IU] via SUBCUTANEOUS
  Filled 2014-09-18 (×5): qty 0.14

## 2014-09-18 MED ORDER — NEPRO/CARBSTEADY PO LIQD
237.0000 mL | Freq: Three times a day (TID) | ORAL | Status: DC
  Administered 2014-09-18: 237 mL via ORAL
  Filled 2014-09-18 (×6): qty 237

## 2014-09-18 MED ORDER — SODIUM CHLORIDE 0.9 % IV BOLUS (SEPSIS): 1000.0000 mL | INTRAVENOUS | Status: AC

## 2014-09-18 MED ORDER — METOPROLOL SUCCINATE 12.5 MG HALF TABLET
12.5000 mg | ORAL_TABLET | Freq: Two times a day (BID) | ORAL | Status: DC
  Filled 2014-09-18: qty 1

## 2014-09-18 MED ORDER — VANCOMYCIN HCL IN DEXTROSE 1-5 GM/200ML-% IV SOLN
1000.0000 mg | Freq: Once | INTRAVENOUS | Status: AC
  Administered 2014-09-18: 1000 mg via INTRAVENOUS
  Filled 2014-09-18: qty 200

## 2014-09-18 MED ORDER — ACETAMINOPHEN 325 MG PO TABS
650.0000 mg | ORAL_TABLET | Freq: Four times a day (QID) | ORAL | Status: DC | PRN
  Filled 2014-09-18: qty 2

## 2014-09-18 MED ORDER — DIGOXIN 125 MCG PO TABS
0.1250 mg | ORAL_TABLET | Freq: Every day | ORAL | Status: DC
  Administered 2014-09-18 – 2014-09-22 (×5): 0.125 mg via ORAL
  Filled 2014-09-18 (×5): qty 1

## 2014-09-18 NOTE — ED Notes (Signed)
Troponin of .25 RN Jakeema Notified. DR Denton Lank notified Charge RN Francesco Runner notified

## 2014-09-18 NOTE — ED Provider Notes (Addendum)
CSN: [id number]     Arrival date & time 09/18/14  1207 History   First MD Initiated Contact with Patient 09/18/14 1257     Chief Complaint  Patient presents with  . Weakness     (Consider location/radiation/quality/duration/timing/severity/associated sxs/prior Treatment) Patient is a 69 y.o. male presenting with weakness. The history is provided by the patient.  Weakness Associated symptoms include shortness of breath. Pertinent negatives include no chest pain, no abdominal pain and no headaches.  Patient w several month hx weight loss, weakness, failure to thrive, c/o worsening weakness, very poor po intake, subjective chills, ?fever, over the course of the past several days. Symptoms mod-severe. Pt denies specific exacerbating or alleviating factors. Nausea. No vomiting. Having normal bms. No dysuria or gu c/o. Pt denies headaches. No chest pain. +mild sob. Denies cough or uri c/o. Pt denies any focal or unilateral numbness/weakness.      Past Medical History  Diagnosis Date  . Venous thromboembolism     DVTs in 2004, 2005, 2007, 8/10, 8/11. IVC filter 2005. Off coumadin 04/2011 secondary to rectal bleeding  . 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 Chronic venous insufficiency   . DM2 (diabetes mellitus, type 2)   . GERD (gastroesophageal reflux disease)   . BPH (benign prostatic hypertrophy)   . Low back pain   . HTN (hypertension)   . HLD (hyperlipidemia)   . NICM (nonischemic cardiomyopathy) 1. 9/11  2. 2/12    1. Echo septal and apical akinesis, dilated LV, EF 45%, RV normal size and systolic function, EF 43% on Myoview  2. Admitted with decompensated CHF, echo EF 30-35% with diffuse hypokinesis but akinesis of mid to apical anteroseptal wall and apex, moderate LVH, mild MR, grade I diastolic dysfunction, SPEP/UP negative and HIV negative. Most recent EF 10-15% 04/2011  . Smoking   . CAD (coronary artery disease) 1. 2007  2. 10/11    1. Left heart cath  with 40-50% stenosis in small RCA EF 50%  2. Lexiscan myoview EF 43%, global hypokinesis, possible small area of apical ischemia; LHC no angiographic CAD, no LV-gram done due to CKD   2. No angiographic CAD by cath 02/2010  . CKD (chronic kidney disease) stage 2, GFR 60-89 ml/min as of 02/2011  . Headache(784.0)   . Diabetes mellitus   . Blood transfusion   . Anemia   . Syncope     1. in setting of presumed orthostatic Hypotn 09/2011  . Right fibular fracture     1. 09/2011 - resulting from fall/syncope  . Closed right ankle fracture 10/10/2011   Past Surgical History  Procedure Laterality Date  . Cardiac catheterization    . Appendectomy    . Cholecystectomy    . Colonoscopy  05/01/2011    Procedure: COLONOSCOPY;  Surgeon: Louis Meckel, MD;  Location: [facility] ENDOSCOPY;  Service: Endoscopy;  Laterality: N/A;  . Orif ankle fracture  10/10/2011    Procedure: OPEN REDUCTION INTERNAL FIXATION (ORIF) ANKLE FRACTURE;  Surgeon: Eulas Post, MD;  Location: [facility] OR;  Service: Orthopedics;  Laterality: Right;  Right  Ankle Fracture ORIF  . Right heart catheterization N/A 06/25/2011    Procedure: RIGHT HEART CATH;  Surgeon: Dolores Patty, MD;  Location: [facility] CATH LAB;  Service: Cardiovascular;  Laterality: N/A;  . Implantable cardioverter defibrillator implant N/A 06/13/2013    Procedure: IMPLANTABLE CARDIOVERTER DEFIBRILLATOR IMPLANT;  Surgeon: Marinus Maw, MD;  Location: [facility] CATH LAB;  Service: Cardiovascular;  Laterality: N/A;  . Right heart catheterization N/A 08/15/2014    Procedure: RIGHT HEART CATH;  Surgeon: Dolores Patty, MD;  Location: [facility] CATH LAB;  Service: Cardiovascular;  Laterality: N/A;   Family History  Problem Relation Age of Onset  . Coronary artery disease Neg Hx     Premature   History  Substance Use Topics  . Smoking status: Current Some Day Smoker -- 1.00 packs/day for 40 years    Types: Cigarettes    Last Attempt to Quit: 06/30/2011  . Smokeless tobacco: Never  Used  . Alcohol Use: No     Comment: occasionally    Review of Systems  Constitutional: Positive for chills. Negative for fever.  HENT: Negative for sore throat.   Eyes: Negative for visual disturbance.  Respiratory: Positive for shortness of breath. Negative for cough.   Cardiovascular: Negative for chest pain.  Gastrointestinal: Positive for nausea. Negative for abdominal pain.  Endocrine: Negative for polyuria.  Genitourinary: Negative for flank pain.  Musculoskeletal: Negative for back pain and neck pain.  Skin: Negative for rash.  Neurological: Positive for weakness. Negative for numbness and headaches.  Hematological: Does not bruise/bleed easily.  Psychiatric/Behavioral: Negative for confusion.      Allergies  Review of patient's allergies indicates no known allergies.  Home Medications   Prior to Admission medications   Medication Sig Start Date End Date Taking? Authorizing Provider  acetaminophen (TYLENOL) 500 MG tablet Take 1,000 mg by mouth every 6 (six) hours as needed for moderate pain (pain).    Yes Historical Provider, MD  allopurinol (ZYLOPRIM) 100 MG tablet Take 1 tablet (100 mg total) by mouth daily. 08/09/14  Yes Dolores Patty, MD  atorvastatin (LIPITOR) 40 MG tablet Take 40 mg by mouth daily at 6 PM.  07/26/14  Yes Historical Provider, MD  clobetasol cream (TEMOVATE) 0.05 % Apply 1 application topically 2 (two) times daily.  07/26/14  Yes Historical Provider, MD  esomeprazole (NEXIUM) 40 MG capsule Take 40 mg by mouth daily at 12 noon.   Yes Historical Provider, MD  famotidine (PEPCID) 20 MG tablet Take 20 mg by mouth daily.   Yes Historical Provider, MD  HYDROcodone-acetaminophen (NORCO/VICODIN) 5-325 MG per tablet Take 1 tablet by mouth every 6 (six) hours as needed. 03/01/14  Yes Roxy Horseman, PA-C  Insulin Glargine (LANTUS SOLOSTAR) 100 UNIT/ML Solostar Pen Inject 25 Units into the skin 2 (two) times daily. Patient taking differently: Inject 20 Units  into the skin 2 (two) times daily.  04/29/14  Yes Clydia Llano, MD  lisinopril (PRINIVIL,ZESTRIL) 5 MG tablet Take 5 mg by mouth every evening.   Yes Historical Provider, MD  metolazone (ZAROXOLYN) 2.5 MG tablet Take 2.5 mg by mouth daily.  07/26/14  Yes Historical Provider, MD  nateglinide (STARLIX) 120 MG tablet Take 120 mg by mouth 3 (three) times daily with meals.  06/28/14  Yes Historical Provider, MD  pantoprazole (PROTONIX) 40 MG tablet Take 40 mg by mouth daily.  07/26/14  Yes Historical Provider, MD  tamsulosin (FLOMAX) 0.4 MG CAPS capsule Take 0.4 mg by mouth daily after supper.   Yes Historical Provider, MD  torsemide (DEMADEX) 20 MG tablet Take 20 mg by mouth daily.  07/26/14  Yes Historical Provider, MD  apixaban (ELIQUIS) 2.5 MG TABS tablet Take 1 tablet (2.5 mg total) by mouth 2 (two) times daily. 08/10/14   Dolores Patty, MD  digoxin (LANOXIN) 0.125 MG tablet Take 0.125 mg by mouth daily.  Historical Provider, MD  eplerenone (INSPRA) 25 MG tablet Take 25 mg by mouth daily.    Historical Provider, MD  hydrALAZINE (APRESOLINE) 10 MG tablet Take 2 tablets (20 mg total) by mouth 3 (three) times daily. 09/01/14   Laurey Morale, MD  isosorbide mononitrate (IMDUR) 30 MG 24 hr tablet Take 30 mg by mouth daily. 12/09/12   Amy D Filbert Schilder, NP  metoprolol succinate (TOPROL-XL) 25 MG 24 hr tablet Take 12.5 mg by mouth 2 (two) times daily.  07/26/14   Historical Provider, MD   BP 100/58 mmHg  Pulse 118  Temp(Src) 97.6 F (36.4 C) (Oral)  Resp 22  SpO2 100% Physical Exam  Constitutional: He is oriented to person, place, and time.  Patient extremely thin, cachectic appearing.   HENT:  Head: Atraumatic.  Mouth/Throat: Oropharynx is clear and moist.  Eyes: Conjunctivae are normal. Pupils are equal, round, and reactive to light. No scleral icterus.  Neck: Neck supple. No tracheal deviation present. No thyromegaly present.  Cardiovascular: Regular rhythm, normal heart sounds and intact distal  pulses.   Tachycardic.   Pulmonary/Chest: Effort normal and breath sounds normal. No accessory muscle usage. No respiratory distress.  Abdominal: Soft. Bowel sounds are normal. He exhibits no distension and no mass. There is no tenderness. There is no rebound and no guarding.  Genitourinary:  No cva tenderness  Musculoskeletal: Normal range of motion. He exhibits no edema.  Neurological: He is alert and oriented to person, place, and time.  Skin: Skin is warm and dry. No rash noted.  Psychiatric: He has a normal mood and affect.  Nursing note and vitals reviewed.   ED Course  Procedures (including critical care time) Labs Review   Results for orders placed or performed during the hospital encounter of 09/18/14  Basic metabolic panel  Result Value Ref Range   Sodium 138 135 - 145 mmol/L   Potassium 4.8 3.5 - 5.1 mmol/L   Chloride 102 101 - 111 mmol/L   CO2 19 (L) 22 - 32 mmol/L   Glucose, Bld 117 (H) 70 - 99 mg/dL   BUN 77 (H) 6 - 20 mg/dL   Creatinine, Ser 1.61 (H) 0.61 - 1.24 mg/dL   Calcium 09.6 8.9 - 04.5 mg/dL   GFR calc non Af Amer 21 (L) >60 mL/min   GFR calc Af Amer 25 (L) >60 mL/min   Anion gap 17 (H) 5 - 15  CBC  Result Value Ref Range   WBC 9.6 4.0 - 10.5 K/uL   RBC 5.61 4.22 - 5.81 MIL/uL   Hemoglobin 15.8 13.0 - 17.0 g/dL   HCT 40.9 81.1 - 91.4 %   MCV 84.1 78.0 - 100.0 fL   MCH 28.2 26.0 - 34.0 pg   MCHC 33.5 30.0 - 36.0 g/dL   RDW 78.2 95.6 - 21.3 %   Platelets 72 (L) 150 - 400 K/uL  Urinalysis, Routine w reflex microscopic  Result Value Ref Range   Color, Urine AMBER (A) YELLOW   APPearance CLOUDY (A) CLEAR   Specific Gravity, Urine 1.019 1.005 - 1.030   pH 5.0 5.0 - 8.0   Glucose, UA NEGATIVE NEGATIVE mg/dL   Hgb urine dipstick MODERATE (A) NEGATIVE   Bilirubin Urine NEGATIVE NEGATIVE   Ketones, ur NEGATIVE NEGATIVE mg/dL   Protein, ur NEGATIVE NEGATIVE mg/dL   Urobilinogen, UA 0.2 0.0 - 1.0 mg/dL   Nitrite NEGATIVE NEGATIVE   Leukocytes, UA  NEGATIVE NEGATIVE  Digoxin level  Result Value Ref Range  Digoxin Level <0.2 (L) 0.8 - 2.0 ng/mL  Hepatic function panel  Result Value Ref Range   Total Protein 6.5 6.5 - 8.1 g/dL   Albumin 4.0 3.5 - 5.0 g/dL   AST 338 (H) 15 - 41 U/L   ALT 102 (H) 17 - 63 U/L   Alkaline Phosphatase 180 (H) 38 - 126 U/L   Total Bilirubin 2.9 (H) 0.3 - 1.2 mg/dL   Bilirubin, Direct 1.2 (H) 0.1 - 0.5 mg/dL   Indirect Bilirubin 1.7 (H) 0.3 - 0.9 mg/dL  Urine microscopic-add on  Result Value Ref Range   WBC, UA 0-2 <3 WBC/hpf   RBC / HPF 0-2 <3 RBC/hpf   Casts HYALINE CASTS (A) NEGATIVE  CBG monitoring, ED  Result Value Ref Range   Glucose-Capillary 106 (H) 70 - 99 mg/dL  I-Stat Chem 8, ED  (not at [facility])  Result Value Ref Range   Sodium 137 135 - 145 mmol/L   Potassium 4.6 3.5 - 5.1 mmol/L   Chloride 105 101 - 111 mmol/L   BUN 66 (H) 6 - 20 mg/dL   Creatinine, Ser 2.50 (H) 0.61 - 1.24 mg/dL   Glucose, Bld 539 (H) 70 - 99 mg/dL   Calcium, Ion 7.67 (L) 1.13 - 1.30 mmol/L   TCO2 17 0 - 100 mmol/L   Hemoglobin 18.7 (H) 13.0 - 17.0 g/dL   HCT 34.1 (H) 93.7 - 90.2 %  I-stat troponin, ED  Result Value Ref Range   Troponin i, poc 0.25 (HH) 0.00 - 0.08 ng/mL   Comment NOTIFIED PHYSICIAN    Comment 3          I-Stat CG4 Lactic Acid, ED  Result Value Ref Range   Lactic Acid, Venous 3.72 (HH) 0.5 - 2.0 mmol/L   Comment NOTIFIED PHYSICIAN    Dg Chest Port 1 View  09/18/2014   CLINICAL DATA:  Shortness of breath and weakness.  EXAM: PORTABLE CHEST - 1 VIEW  COMPARISON:  CT chest 07/20/2014.  PA and lateral chest 08/12/2014.  FINDINGS: Single lead AICD is again seen. There is cardiomegaly but no pulmonary edema. The lungs are clear. No pneumothorax or pleural effusion is identified.  IMPRESSION: Cardiomegaly without acute disease.   Electronically Signed   By: Drusilla Kanner M.D.   On: 09/18/2014 13:58      EKG Interpretation   Date/Time:  Monday Sep 18 2014 12:35:41 EDT Ventricular Rate:   116 PR Interval:    QRS Duration: 94 QT Interval:  374 QTC Calculation: 520 R Axis:   74 Text Interpretation:  Sinus tachycardia Premature ventricular complexes  Left ventricular hypertrophy Prolonged QT interval Non-specific ST-t  changes No significant change since last tracing Reconfirmed by Layce Sprung   MD, Caryn Bee ([id number]) on 09/18/2014 3:19:14 PM      MDM   Iv ns bolus. Continuous pulse ox and monitor. o2 [place]. Labs.   Reviewed nursing notes and prior charts for additional history.   Pt initially w report weakness, chills, and had elevated lactate w aki - cultures sent and was given empiric abx tx.  On additional review old charts, pt with 50+ lb weight loss over course of past 6-9 months, no specific cause found. Had CT 3/16 neg acute.   Pt with prior admit for AKI, severe dehydration, severe protein-cal malnutrition - appears similar today.  Hospitalists paged for admission.  Trop elevated, no chest pain, ecg without acute st/t changes. May need serial markers.   Additional ivf.   Tachycardia  mildly improved. bp improved.  CRITICAL CARE  RE severe weakness, severe dehydration, tachycardia, hypotension, elevated lactate level, severe protein-calorie malnutrition, wt loss, AKI. Performed by: Suzi Roots Total critical care time: 40 Critical care time was exclusive of separately billable procedures and treating other patients. Critical care was necessary to treat or prevent imminent or life-threatening deterioration. Critical care was time spent personally by me on the following activities: development of treatment plan with patient and/or surrogate as well as nursing, discussions with consultants, evaluation of patient's response to treatment, examination of patient, obtaining history from patient or surrogate, ordering and performing treatments and interventions, ordering and review of laboratory studies, ordering and review of radiographic studies, pulse oximetry and  re-evaluation of patient's condition.      Cathren Laine, MD 09/18/14 [phone]

## 2014-09-18 NOTE — ED Notes (Signed)
Pt. Is unable to use the restroom at this time, but is aware that we need a urine specimen. Urinal at bedside. 

## 2014-09-18 NOTE — ED Notes (Signed)
Bed: ML54 Expected date:  Expected time:  Means of arrival:  Comments: EMS- 68yo M, weakness

## 2014-09-18 NOTE — H&P (Signed)
Patient Demographics  [name], is a 69 y.o. male  MRN: [id number]   DOB - Mar 02, 1946  Admit Date - 09/18/2014  Outpatient Primary MD for the patient is OSEI-BONSU,GEORGE, MD   With History of -  Past Medical History  Diagnosis Date  . Venous thromboembolism     DVTs in 2004, 2005, 2007, 8/10, 8/11. IVC filter 2005. Off coumadin 04/2011 secondary to rectal bleeding  . 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 Chronic venous insufficiency   . DM2 (diabetes mellitus, type 2)   . GERD (gastroesophageal reflux disease)   . BPH (benign prostatic hypertrophy)   . Low back pain   . HTN (hypertension)   . HLD (hyperlipidemia)   . NICM (nonischemic cardiomyopathy) 1. 9/11  2. 2/12    1. Echo septal and apical akinesis, dilated LV, EF 45%, RV normal size and systolic function, EF 43% on Myoview  2. Admitted with decompensated CHF, echo EF 30-35% with diffuse hypokinesis but akinesis of mid to apical anteroseptal wall and apex, moderate LVH, mild MR, grade I diastolic dysfunction, SPEP/UP negative and HIV negative. Most recent EF 10-15% 04/2011  . Smoking   . CAD (coronary artery disease) 1. 2007  2. 10/11    1. Left heart cath with 40-50% stenosis in small RCA EF 50%  2. Lexiscan myoview EF 43%, global hypokinesis, possible small area of apical ischemia; LHC no angiographic CAD, no LV-gram done due to CKD   2. No angiographic CAD by cath 02/2010  . CKD (chronic kidney disease) stage 2, GFR 60-89 ml/min as of 02/2011  . Headache(784.0)   . Diabetes mellitus   . Blood transfusion   . Anemia   . Syncope     1. in setting of presumed orthostatic Hypotn 09/2011  . Right fibular fracture     1. 09/2011 - resulting from fall/syncope  . Closed right ankle fracture 10/10/2011      Past Surgical History  Procedure Laterality Date  . Cardiac catheterization    . Appendectomy    . Cholecystectomy    . Colonoscopy  05/01/2011    Procedure: COLONOSCOPY;  Surgeon:  Louis Meckel, MD;  Location: [facility] ENDOSCOPY;  Service: Endoscopy;  Laterality: N/A;  . Orif ankle fracture  10/10/2011    Procedure: OPEN REDUCTION INTERNAL FIXATION (ORIF) ANKLE FRACTURE;  Surgeon: Eulas Post, MD;  Location: [facility] OR;  Service: Orthopedics;  Laterality: Right;  Right  Ankle Fracture ORIF  . Right heart catheterization N/A 06/25/2011    Procedure: RIGHT HEART CATH;  Surgeon: Dolores Patty, MD;  Location: [facility] CATH LAB;  Service: Cardiovascular;  Laterality: N/A;  . Implantable cardioverter defibrillator implant N/A 06/13/2013    Procedure: IMPLANTABLE CARDIOVERTER DEFIBRILLATOR IMPLANT;  Surgeon: Marinus Maw, MD;  Location: [facility] CATH LAB;  Service: Cardiovascular;  Laterality: N/A;  . Right heart catheterization N/A 08/15/2014    Procedure: RIGHT HEART CATH;  Surgeon: Dolores Patty, MD;  Location: [facility] CATH LAB;  Service: Cardiovascular;  Laterality: N/A;    in for   Chief Complaint  Patient presents with  . Weakness     HPI  [name]  is a 69 y.o. 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  09/2011 S/P ORIF L ankle. S/P ICD January 2015.  Patient presents today with complaints of weakness, failure to thrive, poor appetite, weight loss, reported has been going on the last few month, but recently worsened over the last few days, questionable subjective fever, patient afebrile in ED, denies any chills, cough, productive sputum, dysuria, chest pain, reports his shortness of breath at baseline,. Workup in ED was significant for worsening renal function, elevated LFTs, elevated troponins, elevated lactic acid, sepsis ED pathway was started on the patient, given IV vancomycin and Zosyn, blood cultures were sent,  urine analysis was negative, chest x-ray showing cardiomegaly with no active disease, hospitalist requested to admit the patient for further evaluation.    Review of Systems    In addition to the HPI above,  No Fever-chills, No Headache, No changes with Vision or hearing, No problems swallowing food or Liquids, poor appetite No Chest pain, Cough baseline Shortness of Breath, No Abdominal pain, No Nausea or Vommitting, Bowel movements are regular, No Blood in stool or Urine, No dysuria, No new skin rashes or bruises, No new joints pains-aches,  No new weakness, tingling, numbness in any extremity, poor generalized weakness No recent weight gain or loss, No polyuria, polydypsia or polyphagia, No significant Mental Stressors.  A full 10 point Review of Systems was done, except as stated above, all other Review of Systems were negative.   Social History History  Substance Use Topics  . Smoking status: Current Some Day Smoker -- 1.00 packs/day for 40 years    Types: Cigarettes    Last Attempt to Quit: 06/30/2011  . Smokeless tobacco: Never Used  . Alcohol Use: No     Comment: occasionally     Family History Family History  Problem Relation Age of Onset  . Coronary artery disease Neg Hx     Premature     Prior to Admission medications   Medication Sig Start Date End Date Taking? Authorizing Provider  acetaminophen (TYLENOL) 500 MG tablet Take 1,000 mg by mouth every 6 (six) hours as needed for moderate pain (pain).    Yes Historical Provider, MD  allopurinol (ZYLOPRIM) 100 MG tablet Take 1 tablet (100 mg total) by mouth daily. 08/09/14  Yes Dolores Patty, MD  atorvastatin (LIPITOR) 40 MG tablet Take 40 mg by mouth daily at 6 PM.  07/26/14  Yes Historical Provider, MD  clobetasol cream (TEMOVATE) 0.05 % Apply 1 application topically 2 (two) times daily.  07/26/14  Yes Historical Provider, MD  esomeprazole (NEXIUM) 40 MG capsule Take 40 mg by mouth daily at 12 noon.   Yes  Historical Provider, MD  famotidine (PEPCID) 20 MG tablet Take 20 mg by mouth daily.   Yes Historical Provider, MD  HYDROcodone-acetaminophen (NORCO/VICODIN) 5-325 MG per tablet Take 1 tablet by mouth every 6 (six) hours as needed. 03/01/14  Yes Roxy Horseman, PA-C  Insulin Glargine (LANTUS SOLOSTAR) 100 UNIT/ML Solostar Pen Inject 25 Units into the skin 2 (two) times daily. Patient taking differently: Inject 20 Units into the skin 2 (two) times daily.  04/29/14  Yes Clydia Llano, MD  lisinopril (PRINIVIL,ZESTRIL) 5 MG tablet Take 5 mg by mouth every evening.   Yes Historical Provider, MD  metolazone (ZAROXOLYN) 2.5 MG tablet Take 2.5 mg by mouth daily.  07/26/14  Yes Historical Provider, MD  nateglinide (STARLIX) 120 MG tablet Take 120 mg by mouth 3 (three) times daily with meals.  06/28/14  Yes Historical Provider, MD  pantoprazole (PROTONIX) 40 MG tablet  Take 40 mg by mouth daily.  07/26/14  Yes Historical Provider, MD  tamsulosin (FLOMAX) 0.4 MG CAPS capsule Take 0.4 mg by mouth daily after supper.   Yes Historical Provider, MD  torsemide (DEMADEX) 20 MG tablet Take 20 mg by mouth daily.  07/26/14  Yes Historical Provider, MD  apixaban (ELIQUIS) 2.5 MG TABS tablet Take 1 tablet (2.5 mg total) by mouth 2 (two) times daily. 08/10/14   Dolores Patty, MD  digoxin (LANOXIN) 0.125 MG tablet Take 0.125 mg by mouth daily.    Historical Provider, MD  eplerenone (INSPRA) 25 MG tablet Take 25 mg by mouth daily.    Historical Provider, MD  hydrALAZINE (APRESOLINE) 10 MG tablet Take 2 tablets (20 mg total) by mouth 3 (three) times daily. 09/01/14   Laurey Morale, MD  isosorbide mononitrate (IMDUR) 30 MG 24 hr tablet Take 30 mg by mouth daily. 12/09/12   Amy D Filbert Schilder, NP  metoprolol succinate (TOPROL-XL) 25 MG 24 hr tablet Take 12.5 mg by mouth 2 (two) times daily.  07/26/14   Historical Provider, MD    No Known Allergies  Physical Exam  Vitals  Blood pressure 95/51, pulse 113, temperature 97.5 F (36.4  C), temperature source Oral, resp. rate 22, SpO2 100 %.   1. General frail, ill-appearing, cachectic male lying in bed in NAD,    2. Normal affect and insight, Not Suicidal or Homicidal, Awake Alert, Oriented X 3.  3. No F.N deficits, ALL C.Nerves Intact, Strength 5/5 all 4 extremities, Sensation intact all 4 extremities, Plantars down going. But generally weak.  4. Ears and Eyes appear Normal, Conjunctivae clear, PERRLA. Moist Oral Mucosa.  5. Supple Neck, No cervical lymphadenopathy appriciated, No Carotid Bruits.  6. Symmetrical Chest wall movement, Good air movement bilaterally.  7. RRR, No Gallops, Rubs 2/6 systolic murmurs,  8. Positive Bowel Sounds, Abdomen Soft, No tenderness, No organomegaly appriciated,No rebound -guarding or rigidity.  9.  No Cyanosis, Normal Skin Turgor, chronic skin discoloration, cold extremities.  10. Good muscle tone,  joints appear normal , no effusions, Normal ROM.  11. No Palpable Lymph Nodes in Neck or Axillae    Data Review  CBC  Recent Labs Lab 09/18/14 1240 09/18/14 1254  WBC 9.6  --   HGB 15.8 18.7*  HCT 47.2 55.0*  PLT 72*  --   MCV 84.1  --   MCH 28.2  --   MCHC 33.5  --   RDW 13.7  --    ------------------------------------------------------------------------------------------------------------------  Chemistries   Recent Labs Lab 09/18/14 1240 09/18/14 1254 09/18/14 1317  NA 138 137  --   K 4.8 4.6  --   CL 102 105  --   CO2 19*  --   --   GLUCOSE 117* 113*  --   BUN 77* 66*  --   CREATININE 2.89* 3.00*  --   CALCIUM 10.0  --   --   AST  --   --  210*  ALT  --   --  102*  ALKPHOS  --   --  180*  BILITOT  --   --  2.9*   ------------------------------------------------------------------------------------------------------------------ CrCl cannot be calculated (Unknown ideal weight.). ------------------------------------------------------------------------------------------------------------------ No  results for input(s): TSH, T4TOTAL, T3FREE, THYROIDAB in the last 72 hours.  Invalid input(s): FREET3   Coagulation profile No results for input(s): INR, PROTIME in the last 168 hours. ------------------------------------------------------------------------------------------------------------------- No results for input(s): DDIMER in the last 72 hours. -------------------------------------------------------------------------------------------------------------------  Cardiac Enzymes No  results for input(s): CKMB, TROPONINI, MYOGLOBIN in the last 168 hours.  Invalid input(s): CK ------------------------------------------------------------------------------------------------------------------ Invalid input(s): POCBNP   ---------------------------------------------------------------------------------------------------------------  Urinalysis    Component Value Date/Time   COLORURINE AMBER* 09/18/2014 1346   APPEARANCEUR CLOUDY* 09/18/2014 1346   LABSPEC 1.019 09/18/2014 1346   PHURINE 5.0 09/18/2014 1346   GLUCOSEU NEGATIVE 09/18/2014 1346   HGBUR MODERATE* 09/18/2014 1346   BILIRUBINUR NEGATIVE 09/18/2014 1346   KETONESUR NEGATIVE 09/18/2014 1346   PROTEINUR NEGATIVE 09/18/2014 1346   UROBILINOGEN 0.2 09/18/2014 1346   NITRITE NEGATIVE 09/18/2014 1346   LEUKOCYTESUR NEGATIVE 09/18/2014 1346    ----------------------------------------------------------------------------------------------------------------  Imaging results:   Dg Chest Port 1 View  09/18/2014   CLINICAL DATA:  Shortness of breath and weakness.  EXAM: PORTABLE CHEST - 1 VIEW  COMPARISON:  CT chest 07/20/2014.  PA and lateral chest 08/12/2014.  FINDINGS: Single lead AICD is again seen. There is cardiomegaly but no pulmonary edema. The lungs are clear. No pneumothorax or pleural effusion is identified.  IMPRESSION: Cardiomegaly without acute disease.   Electronically Signed   By: Drusilla Kanner M.D.   On:  09/18/2014 13:58       Assessment & Plan  Active Problems:   Hyperlipemia   Essential hypertension, benign   Systolic and diastolic CHF, acute on chronic   CHF (congestive heart failure)   DVT (deep venous thrombosis)   Smoking   Diabetes mellitus   Acute on chronic renal failure   ICD (implantable cardioverter-defibrillator) in place   Protein-calorie malnutrition, severe   CHF/low cardiac output failure - Patient's symptoms significant for CHF exacerbation, even though he doesn't have evidence of volume overload, but worsening renal function, elevated LFTs, lactic acidosis, elevated troponins, weight loss, cold extremities, this was discussed with the CHF team Dr. Sampson Goon, very likely patient will need inotropes, central line insertion to check Co OX, so patient will be transferred to 2 heart units of [place]) he'll be seen and evaluated by CHF team. - For now will hold diuresis including Aldactone and Lasix will hold IV fluids, continue with digoxin and metoprolol, and further management will be by CHF team recommendation. - Status post AICD  Acute on chronic renal failure - Please see discussion as above, secondary to low cardiac output, hold lisinopril, monitor closely.  Hypertension - Blood pressure is on the lower side, continue with metoprolol and continue to hold other medication including Imdur, Aldactone, hydralazine, and ACE inhibitor and metolazone.  protein calorie malnutrition severe - Will start on Nepro  Diabetes mellitus - Resume Lantus at a lower dose, and insulin sliding scale.  Smoking -Patient was counseled  Estimated DVT, left ventricular mural thrombus - Status post IVC, continue with liquids  DVT Prophylaxis on Eliquis  AM Labs Ordered, also please review Full Orders  Family Communication: Admission, patients condition and plan of care including tests being ordered have been discussed with the patient  who indicate understanding and agree  with the plan and Code Status.  Code Status Full  Likely DC to  pending further workup  Condition GUARDED    Time spent in minutes : 60 minutes    ELGERGAWY, DAWOOD M.D on 09/18/2014 at 4:37 PM  Between 7am to 7pm - Pager - [phone]  After 7pm go to [email or web address] - password [email or web address]  And look for the night coverage person covering me after hours  [facility] Office  [phone]   **Disclaimer: This note may have been dictated with voice recognition software. Similar  sounding words can inadvertently be transcribed and this note may contain transcription errors which may not have been corrected upon publication of note.**

## 2014-09-18 NOTE — ED Notes (Addendum)
Lactic of 3.72 RN [employer]. DR Denton Lank notified Charge RN Francesco Runner Notified

## 2014-09-18 NOTE — ED Notes (Signed)
Xray bedside.

## 2014-09-18 NOTE — Consult Note (Signed)
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   Admitted from HF clinic 05/30/2012 with volume overload. Diuresed with Milrinone and Lasix. Diuresed 41 pounds. Milrinone and lasix gtt weaned off. CO-OX dropped 43. Beta blocker was stopped due to low output. He was offered home Milrinone however he declined. Discharge weight 175 pounds.  Over past year has had dramatic weight loss and there was concern over malignancy. CT C/A/P was unrevealing.  On 08/15/14 underwent RHC   RA 9,  PA 50/20 mean 33, mean  PCWP 15,  CI 2.4 Fick 1.8 thermo PVR 3.9   Presented to ED today with complaints of profound weakness, poor appetite, weight loss and subjective fever. Unable to any of his ADLs. Denies CP, orthopnea, PND or edema.   Workup in ED was significant for worsening renal function, elevated LFTs, elevated troponins, elevated lactic acid, sepsis ED pathway was started on the patient, given 2L IVF,  IV vancomycin and Zosyn, blood cultures were sent, urine analysis was negative, chest x-ray showing cardiomegaly with no active disease, hospitalist requested to admit the patient for further evaluation. On hospitalist evaluation suspicion was for low output HF an we were called.    Review of Systems: [y] = yes, [ ]  = no   General: Weight gain [ ] ; Weight loss [ y]; Anorexia [ y]; Fatigue [ y]; Fever Cove.Etienne ]; Chills [ ] ; Weakness [ y]  Cardiac: Chest pain/pressure [ ] ; Resting SOB [ ] ; Exertional SOB [ ] ; Orthopnea [ ] ; Pedal Edema [ ] ; Palpitations [ ] ; Syncope [ ] ; Presyncope [ ] ;  Paroxysmal nocturnal dyspnea[ ]   Pulmonary: Cough [ ] ; Wheezing[ ] ; Hemoptysis[ ] ; Sputum [ ] ; Snoring [ ]   GI: Vomiting[ ] ; Dysphagia[ ] ; Melena[ ] ; Hematochezia [ ] ; Heartburn[ ] ; Abdominal pain [ ] ; Constipation [ ] ; Diarrhea [ ] ; BRBPR [ ]   GU: Hematuria[ ] ; Dysuria [ ] ; Nocturia[ ]   Vascular: Pain in legs with walking [ ] ; Pain in feet with lying flat [ ] ; Non-healing sores [ ] ; Stroke [ ] ; TIA [ ] ; Slurred speech [ ] ;  Neuro: Headaches[ ] ; Vertigo[ ] ; Seizures[ ] ; Paresthesias[ ] ;Blurred vision [ ] ; Diplopia [ ] ; Vision changes [ ]   Ortho/Skin: Arthritis Cove.Etienne ]; Joint pain Cove.Etienne ]; Muscle pain [ ] ; Joint swelling [ ] ; Back Pain [ y]; Rash [ ]   Psych: Depression[ ] ; Anxiety[ ]   Heme: Bleeding problems [ ] ; Clotting disorders [ y]; Anemia [ ]   Endocrine: Diabetes Cove.Etienne ]; Thyroid dysfunction[ ]   Home Medications Prior to Admission medications   Medication Sig Start Date End Date Taking? Authorizing Provider  acetaminophen (TYLENOL) 500 MG tablet Take 1,000 mg by mouth every 6 (six) hours as needed for moderate pain (pain).    Yes Historical Provider, MD  allopurinol (ZYLOPRIM) 100 MG tablet Take 1 tablet (100 mg total) by mouth daily. 08/09/14  Yes Dolores Patty, MD  atorvastatin (LIPITOR) 40 MG tablet Take 40 mg by mouth daily at 6 PM.  07/26/14  Yes Historical Provider, MD  clobetasol cream (TEMOVATE) 0.05 % Apply  1 application topically 2 (two) times daily.  07/26/14  Yes Historical Provider, MD  esomeprazole (NEXIUM) 40 MG capsule Take 40 mg by mouth daily at 12 noon.   Yes Historical Provider, MD  famotidine (PEPCID) 20 MG tablet Take 20 mg by mouth daily.   Yes Historical Provider, MD  HYDROcodone-acetaminophen (NORCO/VICODIN) 5-325 MG per tablet Take 1 tablet by mouth every 6 (six) hours as needed. 03/01/14  Yes Roxy Horseman, PA-C  Insulin Glargine (LANTUS SOLOSTAR) 100 UNIT/ML Solostar Pen Inject 25 Units into the skin 2 (two) times daily. Patient taking differently: Inject 20 Units into  the skin 2 (two) times daily.  04/29/14  Yes Clydia Llano, MD  lisinopril (PRINIVIL,ZESTRIL) 5 MG tablet Take 5 mg by mouth every evening.   Yes Historical Provider, MD  metolazone (ZAROXOLYN) 2.5 MG tablet Take 2.5 mg by mouth daily.  07/26/14  Yes Historical Provider, MD  nateglinide (STARLIX) 120 MG tablet Take 120 mg by mouth 3 (three) times daily with meals.  06/28/14  Yes Historical Provider, MD  pantoprazole (PROTONIX) 40 MG tablet Take 40 mg by mouth daily.  07/26/14  Yes Historical Provider, MD  tamsulosin (FLOMAX) 0.4 MG CAPS capsule Take 0.4 mg by mouth daily after supper.   Yes Historical Provider, MD  torsemide (DEMADEX) 20 MG tablet Take 20 mg by mouth daily.  07/26/14  Yes Historical Provider, MD  apixaban (ELIQUIS) 2.5 MG TABS tablet Take 1 tablet (2.5 mg total) by mouth 2 (two) times daily. 08/10/14   Dolores Patty, MD  digoxin (LANOXIN) 0.125 MG tablet Take 0.125 mg by mouth daily.    Historical Provider, MD  eplerenone (INSPRA) 25 MG tablet Take 25 mg by mouth daily.    Historical Provider, MD  hydrALAZINE (APRESOLINE) 10 MG tablet Take 2 tablets (20 mg total) by mouth 3 (three) times daily. 09/01/14   Laurey Morale, MD  isosorbide mononitrate (IMDUR) 30 MG 24 hr tablet Take 30 mg by mouth daily. 12/09/12   Amy D Filbert Schilder, NP  metoprolol succinate (TOPROL-XL) 25 MG 24 hr tablet Take 12.5 mg by mouth 2 (two) times daily.  07/26/14   Historical Provider, MD    Past Medical History: Past Medical History  Diagnosis Date  . Venous thromboembolism     DVTs in 2004, 2005, 2007, 8/10, 8/11. IVC filter 2005. Off coumadin 04/2011 secondary to rectal bleeding  . 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 Chronic venous insufficiency   . DM2 (diabetes mellitus, type 2)   . GERD (gastroesophageal reflux disease)   . BPH (benign prostatic hypertrophy)   . Low back pain   . HTN (hypertension)   . HLD (hyperlipidemia)   . NICM (nonischemic cardiomyopathy) 1. 9/11  2. 2/12     1. Echo septal and apical akinesis, dilated LV, EF 45%, RV normal size and systolic function, EF 43% on Myoview  2. Admitted with decompensated CHF, echo EF 30-35% with diffuse hypokinesis but akinesis of mid to apical anteroseptal wall and apex, moderate LVH, mild MR, grade I diastolic dysfunction, SPEP/UP negative and HIV negative. Most recent EF 10-15% 04/2011  . Smoking   . CAD (coronary artery disease) 1. 2007  2. 10/11    1. Left heart cath with 40-50% stenosis in small RCA EF 50%  2. Lexiscan myoview EF 43%, global hypokinesis, possible small area of apical ischemia; LHC no angiographic CAD, no LV-gram done due to CKD   2. No angiographic  CAD by cath 02/2010  . CKD (chronic kidney disease) stage 2, GFR 60-89 ml/min as of 02/2011  . Headache(784.0)   . Diabetes mellitus   . Blood transfusion   . Anemia   . Syncope     1. in setting of presumed orthostatic Hypotn 09/2011  . Right fibular fracture     1. 09/2011 - resulting from fall/syncope  . Closed right ankle fracture 10/10/2011    Past Surgical History: Past Surgical History  Procedure Laterality Date  . Cardiac catheterization    . Appendectomy    . Cholecystectomy    . Colonoscopy  05/01/2011    Procedure: COLONOSCOPY;  Surgeon: Louis Meckel, MD;  Location[facility] ENDOSCOPY;  Service: Endoscopy;  Laterality: N/A;  . Orif ankle fracture  10/10/2011    Procedure: OPEN REDUCTION INTERNAL FIXATION (ORIF) ANKLE FRACTURE;  Surgeon: Eulas Post, MD;  Location: [facility] OR;  Service: Orthopedics;  Laterality: Right;  Right  Ankle Fracture ORIF  . Right heart catheterization N/A 06/25/2011    Procedure: RIGHT HEART CATH;  Surgeon: Dolores Patty, MD;  Location: [facility] CATH LAB;  Service: Cardiovascular;  Laterality: N/A;  . Implantable cardioverter defibrillator implant N/A 06/13/2013    Procedure: IMPLANTABLE CARDIOVERTER DEFIBRILLATOR IMPLANT;  Surgeon: Marinus Maw, MD;  Location: [facility] CATH LAB;  Service: Cardiovascular;  Laterality: N/A;   . Right heart catheterization N/A 08/15/2014    Procedure: RIGHT HEART CATH;  Surgeon: Dolores Patty, MD;  Location: [facility] CATH LAB;  Service: Cardiovascular;  Laterality: N/A;    Family History: Family History  Problem Relation Age of Onset  . Coronary artery disease Neg Hx     Premature    Social History: History   Social History  . Marital Status: Divorced    Spouse Name: N/A  . Number of Children: 1  . Years of Education: N/A   Occupational History  . security guard     Part time   Social History Main Topics  . Smoking status: Current Some Day Smoker -- 1.00 packs/day for 40 years    Types: Cigarettes    Last Attempt to Quit: 06/30/2011  . Smokeless tobacco: Never Used  . Alcohol Use: No     Comment: occasionally  . Drug Use: No  . Sexual Activity: Not on file   Other Topics Concern  . None   Social History Narrative   Has 1 son.    Allergies:  No Known Allergies  Objective:    Vital Signs:   Temp:  [97.5 F (36.4 C)-97.6 F (36.4 C)] 97.5 F (36.4 C) (05/02 1509) Pulse Rate:  [113-118] 113 (05/02 1737) Resp:  [15-35] 18 (05/02 1737) BP: (91-108)/(51-85) 100/76 mmHg (05/02 1737) SpO2:  [100 %] 100 % (05/02 1737)    Weight change: There were no vitals filed for this visit.  Intake/Output:   Intake/Output Summary (Last 24 hours) at 09/18/14 1740 Last data filed at 09/18/14 1600  Gross per 24 hour  Intake   2250 ml  Output      0 ml  Net   2250 ml     Physical Exam: General: Cachetic Chronically ill appearing. No acute distress.  HEENT: normal except for poor dentition Neck: supple. JVP to jaw Carotids 2+ bilaterally; no bruits. No lymphadenopathy or thryomegaly appreciated. Cor: PMI displaced laterally. Tachy, regular rate and rhythm. 2/6 MR +s3 Lungs: clear Abdomen: Non tender. Good bowel sounds. Non distended Extremities: no cyanosis, clubbing, rash, 2+ woodyedema Neuro: alert &  orientedx3, cranial nerves grossly intact. Moves  all 4 extremities w/o difficulty. Affect pleasant.  Telemetry: Sinus tach  Labs: Basic Metabolic Panel:  Recent Labs Lab 09/18/14 1240 09/18/14 1254  NA 138 137  K 4.8 4.6  CL 102 105  CO2 19*  --   GLUCOSE 117* 113*  BUN 77* 66*  CREATININE 2.89* 3.00*  CALCIUM 10.0  --     Liver Function Tests:  Recent Labs Lab 09/18/14 1317  AST 210*  ALT 102*  ALKPHOS 180*  BILITOT 2.9*  PROT 6.5  ALBUMIN 4.0   No results for input(s): LIPASE, AMYLASE in the last 168 hours. No results for input(s): AMMONIA in the last 168 hours.  CBC:  Recent Labs Lab 09/18/14 1240 09/18/14 1254  WBC 9.6  --   HGB 15.8 18.7*  HCT 47.2 55.0*  MCV 84.1  --   PLT 72*  --     Cardiac Enzymes: No results for input(s): CKTOTAL, CKMB, CKMBINDEX, TROPONINI in the last 168 hours.  BNP: BNP (last 3 results)  Recent Labs  05/15/14 1445 09/01/14 1512  BNP 34.0 1480.0*    ProBNP (last 3 results) No results for input(s): PROBNP in the last 8760 hours.   CBG:  Recent Labs Lab 09/18/14 1212  GLUCAP 106*    Coagulation Studies: No results for input(s): LABPROT, INR in the last 72 hours.  Other results: EKG: Sinus tach 115 LVH with strain Imaging: Dg Chest Port 1 View  09/18/2014   CLINICAL DATA:  Shortness of breath and weakness.  EXAM: PORTABLE CHEST - 1 VIEW  COMPARISON:  CT chest 07/20/2014.  PA and lateral chest 08/12/2014.  FINDINGS: Single lead AICD is again seen. There is cardiomegaly but no pulmonary edema. The lungs are clear. No pneumothorax or pleural effusion is identified.  IMPRESSION: Cardiomegaly without acute disease.   Electronically Signed   By: Drusilla Kanner M.D.   On: 09/18/2014 13:58      Medications:     Current Medications: . feeding supplement (NEPRO CARB STEADY)  237 mL Oral TID BM  . [START ON 09/19/2014] insulin aspart  0-9 Units Subcutaneous TID WC     Infusions:      Assessment:   1. Cardiogenic shock 2. Acute on chronic systolic HF   3. Acute on chronic renal failure, stage 4 4. Shock liver 5. Severe protein calorie malnutrition 6. Cachexia   Plan/Discussion:    He has end-stage HF and is actively dying from cardiogenic shock with multi-system organ failure. I had long talk with him about this and suggested that we proceed with comfort care but he refuses. He wants to be aggressive and "try everything" possible. Wants full code as well. Thus we will plae need central access for monitoring of CVP and co-ox as well as inotropes support. I will ask Palliative Care team to see him to continue EOL discussions.  Hold b-blocker for now.   The patient is critically ill with multiple organ systems failure and requires high complexity decision making for assessment and support, frequent evaluation and titration of therapies, application of advanced monitoring technologies and extensive interpretation of multiple databases.   Critical Care Time devoted to patient care services described in this note is 40 Minutes.   Length of Stay: 0  Arvilla Meres MD 09/18/2014, 5:40 PM  Advanced Heart Failure Team Pager [phone] (M-F; 7a - 4p)  Please contact [facility] for night-coverage after hours (4p -7a ) and weekends on [email or web address]

## 2014-09-18 NOTE — ED Notes (Addendum)
Pt states he has extreme weakness.  He states he has an appetite occasionally.  He denies any vomiting or diarrhea.  He has had some nausea.  He has taken tums for nausea.  He states he has become weaker over time.  He states he hasn't had any dizzy spells or blackouts.  No falls. He is alert x 4.  EMS states BP 100/50 60 HR 16 Respirations 88 CBG

## 2014-09-18 NOTE — ED Notes (Signed)
I attempted to collect labs and was unsuccessful.  I made nurse aware and i called main lab.

## 2014-09-18 NOTE — Procedures (Signed)
Central Venous Catheter Insertion Procedure Note ARMANII BOELENS [id number] [birth year]  Procedure: Insertion of Central Venous Catheter Indications: Assessment of intravascular volume  Procedure Details Consent: Risks of procedure as well as the alternatives and risks of each were explained to the (patient/caregiver).  Consent for procedure obtained. Time Out: Verified patient identification, verified procedure, site/side was marked, verified correct patient position, special equipment/implants available, medications/allergies/relevent history reviewed, required imaging and test results available.  Performed  Maximum sterile technique was used including antiseptics, cap, gloves, gown, hand hygiene, mask and sheet. Skin prep: Chlorhexidine; local anesthetic administered A antimicrobial bonded/coated triple lumen catheter was placed in the right internal jugular vein using the Seldinger technique and ultrasound guidance.  Evaluation Blood flow good Complications: No apparent complications Patient did tolerate procedure well. Chest X-ray ordered to verify placement.  CXR: pending.  Arvilla Meres MD 09/18/2014, 7:48 PM

## 2014-09-19 DIAGNOSIS — E119 Type 2 diabetes mellitus without complications: Secondary | ICD-10-CM | POA: Diagnosis present

## 2014-09-19 DIAGNOSIS — I5023 Acute on chronic systolic (congestive) heart failure: Secondary | ICD-10-CM

## 2014-09-19 DIAGNOSIS — I513 Intracardiac thrombosis, not elsewhere classified: Secondary | ICD-10-CM | POA: Diagnosis present

## 2014-09-19 DIAGNOSIS — Z720 Tobacco use: Secondary | ICD-10-CM | POA: Diagnosis present

## 2014-09-19 DIAGNOSIS — I10 Essential (primary) hypertension: Secondary | ICD-10-CM | POA: Diagnosis present

## 2014-09-19 DIAGNOSIS — Z515 Encounter for palliative care: Secondary | ICD-10-CM

## 2014-09-19 DIAGNOSIS — I959 Hypotension, unspecified: Secondary | ICD-10-CM

## 2014-09-19 DIAGNOSIS — I5032 Chronic diastolic (congestive) heart failure: Secondary | ICD-10-CM

## 2014-09-19 DIAGNOSIS — I213 ST elevation (STEMI) myocardial infarction of unspecified site: Secondary | ICD-10-CM

## 2014-09-19 DIAGNOSIS — Z7189 Other specified counseling: Secondary | ICD-10-CM

## 2014-09-19 LAB — CARBOXYHEMOGLOBIN
Carboxyhemoglobin: 0.8 % (ref 0.5–1.5)
Methemoglobin: 0.8 % (ref 0.0–1.5)
O2 Saturation: 48.6 %
TOTAL HEMOGLOBIN: 13.8 g/dL (ref 13.5–18.0)

## 2014-09-19 LAB — GLUCOSE, CAPILLARY
Glucose-Capillary: 160 mg/dL — ABNORMAL HIGH (ref 70–99)
Glucose-Capillary: 193 mg/dL — ABNORMAL HIGH (ref 70–99)
Glucose-Capillary: 138 mg/dL — ABNORMAL HIGH (ref 70–99)
Glucose-Capillary: 102 mg/dL — ABNORMAL HIGH (ref 70–99)

## 2014-09-19 LAB — COMPREHENSIVE METABOLIC PANEL
ALT: 75 U/L — AB (ref 17–63)
AST: 119 U/L — ABNORMAL HIGH (ref 15–41)
Albumin: 2.9 g/dL — ABNORMAL LOW (ref 3.5–5.0)
Alkaline Phosphatase: 142 U/L — ABNORMAL HIGH (ref 38–126)
Anion gap: 18 — ABNORMAL HIGH (ref 5–15)
BUN: 71 mg/dL — ABNORMAL HIGH (ref 6–20)
CO2: 17 mmol/L — AB (ref 22–32)
Calcium: 9.2 mg/dL (ref 8.9–10.3)
Chloride: 106 mmol/L (ref 101–111)
Creatinine, Ser: 2.58 mg/dL — ABNORMAL HIGH (ref 0.61–1.24)
GFR, EST AFRICAN AMERICAN: 28 mL/min — AB (ref >60)
GFR, EST NON AFRICAN AMERICAN: 24 mL/min — AB (ref >60)
GLUCOSE: 149 mg/dL — AB (ref 70–99)
POTASSIUM: 4.2 mmol/L (ref 3.5–5.1)
Sodium: 141 mmol/L (ref 135–145)
Total Bilirubin: 1.9 mg/dL — ABNORMAL HIGH (ref 0.3–1.2)
Total Protein: 5.2 g/dL — ABNORMAL LOW (ref 6.5–8.1)

## 2014-09-19 LAB — CBC
HCT: 43.7 % (ref 39.0–52.0)
HEMOGLOBIN: 14.5 g/dL (ref 13.0–17.0)
MCH: 27.9 pg (ref 26.0–34.0)
MCHC: 33.2 g/dL (ref 30.0–36.0)
MCV: 84.2 fL (ref 78.0–100.0)
Platelets: 68 10*3/uL — ABNORMAL LOW (ref 150–400)
RBC: 5.19 MIL/uL (ref 4.22–5.81)
RDW: 13.7 % (ref 11.5–15.5)
WBC: 8.6 10*3/uL (ref 4.0–10.5)

## 2014-09-19 MED ORDER — DOBUTAMINE IN D5W 4-5 MG/ML-% IV SOLN
5.0000 ug/kg/min | INTRAVENOUS | Status: DC
  Administered 2014-09-19: 5 ug/kg/min via INTRAVENOUS
  Filled 2014-09-19: qty 250

## 2014-09-19 MED ORDER — FUROSEMIDE 10 MG/ML IJ SOLN
80.0000 mg | Freq: Once | INTRAMUSCULAR | Status: AC
  Administered 2014-09-19: 80 mg via INTRAVENOUS
  Filled 2014-09-19: qty 8

## 2014-09-19 MED ORDER — ENSURE ENLIVE PO LIQD
237.0000 mL | Freq: Three times a day (TID) | ORAL | Status: DC
  Administered 2014-09-19 – 2014-09-21 (×3): 237 mL via ORAL

## 2014-09-19 NOTE — Progress Notes (Addendum)
[place] TEAM 1 - Stepdown/ICU TEAM Progress Note  [name] GYF:[id number] DOB: 1946-03-20 DOA: 09/18/2014 PCP: Jackie Plum, MD  Admit HPI / Brief Narrative: [name] is a 69 y.o. BM PMHx  chronic systolic CHF secondary to nonischemic cardiomyopathy, EF 30-35%, hypertension, anemia, CKD, upper GI bleed, rectal bleeding, duodenal AVM, DVT, Coumadin stopped secondary to GI bleed,(now on Pradaxa), IVC filter, GERD, BPD, Urinary Retention, and chronic low back. 09/2011 S/P ORIF L ankle. S/P ICD January 2015.   Patient presents today with complaints of weakness, failure to thrive, poor appetite, weight loss, reported has been going on the last few month, but recently worsened over the last few days, questionable subjective fever, patient afebrile in ED, denies any chills, cough, productive sputum, dysuria, chest pain, reports his shortness of breath at baseline,. Workup in ED was significant for worsening renal function, elevated LFTs, elevated troponins, elevated lactic acid, sepsis ED pathway was started on the patient, given IV vancomycin and Zosyn, blood cultures were sent, urine analysis was negative, chest x-ray showing cardiomegaly with no active disease, hospitalist requested to admit the patient for further evaluation  HPI/Subjective: 5/3 A/O 4, negative CP, negative SOB. States that Dr. Bevelyn Buckles Bensimhon did speak with him concerning end-of-life issues, however he has not decided yet.  Assessment/Plan: Systolic CHF/low cardiac output failure - Status post AICD -Per cardiology; digoxin 0.125 mg daily, -Dobutamine drip  Acute on chronic renal failure baseline Cr ~1.33-1.8 - Hold all nephrotoxic medication -Hold BP medication, patient currently hypotensive  Hypertension/hypotension - Hold all BP medication secondary to patient's continued hypotension.   protein calorie malnutrition severe - Will start on Nepro  Diabetes mellitus Type 2 - Resume Lantus at a  lower dose, and insulin sliding scale. -Hemoglobin A1c pending  Smoking abuse  -Patient was counseled  Estimated DVT, left ventricular mural thrombus - Status post IVC, continue with liquids   Code Status: FULL Family Communication: no family present at time of exam Disposition Plan: Consult placed to palliative care; patient would like to discuss his options    Consultants: (CHF team) Dr. Bevelyn Buckles Bensimhon,  Procedure/Significant Events: NA   Culture   Antibiotics:   DVT prophylaxis: Eliquis   Devices    LINES / TUBES:      Continuous Infusions: . DOBUTamine 5 mcg/kg/min (09/19/14 9163)    Objective: VITAL SIGNS: Temp: 97.7 F (36.5 C) (05/03 1900) Temp Source: Oral (05/03 1900) BP: 91/69 mmHg (05/03 2000) Pulse Rate: 118 (05/03 2000) SPO2; FIO2:   Intake/Output Summary (Last 24 hours) at 09/19/14 2116 Last data filed at 09/19/14 2000  Gross per 24 hour  Intake 864.58 ml  Output   3950 ml  Net -3085.42 ml     Exam: General: A/O 4, NAD, cachectic, No acute respiratory distress Lungs: Clear to auscultation bilaterally without wheezes or crackles Cardiovascular: Tachycardic, regular rhythm without murmur gallop or rub normal S1 and S2 Abdomen: Nontender, nondistended, soft, bowel sounds positive, no rebound, no ascites, no appreciable mass Extremities: No significant cyanosis, clubbing, or edema bilateral lower extremities  Data Reviewed: Basic Metabolic Panel:  Recent Labs Lab 09/18/14 1240 09/18/14 1254 09/19/14 0232  NA 138 137 141  K 4.8 4.6 4.2  CL 102 105 106  CO2 19*  --  17*  GLUCOSE 117* 113* 149*  BUN 77* 66* 71*  CREATININE 2.89* 3.00* 2.58*  CALCIUM 10.0  --  9.2   Liver Function Tests:  Recent Labs Lab 09/18/14 1317 09/19/14 0232  AST 210*  119*  ALT 102* 75*  ALKPHOS 180* 142*  BILITOT 2.9* 1.9*  PROT 6.5 5.2*  ALBUMIN 4.0 2.9*   No results for input(s): LIPASE, AMYLASE in the last 168 hours. No  results for input(s): AMMONIA in the last 168 hours. CBC:  Recent Labs Lab 09/18/14 1240 09/18/14 1254 09/19/14 0232  WBC 9.6  --  8.6  HGB 15.8 18.7* 14.5  HCT 47.2 55.0* 43.7  MCV 84.1  --  84.2  PLT 72*  --  68*   Cardiac Enzymes: No results for input(s): CKTOTAL, CKMB, CKMBINDEX, TROPONINI in the last 168 hours. BNP (last 3 results)  Recent Labs  05/15/14 1445 09/01/14 1512  BNP 34.0 1480.0*    ProBNP (last 3 results) No results for input(s): PROBNP in the last 8760 hours.  CBG:  Recent Labs Lab 09/18/14 1212 09/18/14 2129 09/19/14 0753 09/19/14 1204  GLUCAP 106* 109* 160* 193*    Recent Results (from the past 240 hour(s))  Blood culture (routine x 2)     Status: None (Preliminary result)   Collection Time: 09/18/14  2:25 PM  Result Value Ref Range Status   Specimen Description BLOOD RIGHT ARM  Final   Special Requests BOTTLES DRAWN AEROBIC ONLY 6CC  Final   Culture   Final           BLOOD CULTURE RECEIVED NO GROWTH TO DATE CULTURE WILL BE HELD FOR 5 DAYS BEFORE ISSUING A FINAL NEGATIVE REPORT Performed at [facility]    Report Status PENDING  Incomplete  MRSA PCR Screening     Status: None   Collection Time: 09/18/14  6:38 PM  Result Value Ref Range Status   MRSA by PCR NEGATIVE NEGATIVE Final    Comment:        The GeneXpert MRSA Assay (FDA approved for NASAL specimens only), is one component of a comprehensive MRSA colonization surveillance program. It is not intended to diagnose MRSA infection nor to guide or monitor treatment for MRSA infections.      Studies:  Recent x-ray studies have been reviewed in detail by the Attending Physician  Scheduled Meds:  Scheduled Meds: . allopurinol  100 mg Oral Daily  . apixaban  2.5 mg Oral BID  . atorvastatin  40 mg Oral q1800  . digoxin  0.125 mg Oral Daily  . docusate sodium  100 mg Oral BID  . feeding supplement (ENSURE ENLIVE)  237 mL Oral TID BM  . insulin aspart  0-9 Units  Subcutaneous TID WC  . insulin glargine  14 Units Subcutaneous BID  . pantoprazole  40 mg Oral Daily  . tamsulosin  0.4 mg Oral QPC supper    Time spent on care of this patient: 40 mins   WOODS, Roselind Messier , MD  [facility] Office  [phone] Pager [phone]  On-Call/Text Page:      [email or web address]      password [email or web address]  If 7PM-7AM, please contact night-coverage [email or web address] Password [email or web address] 09/19/2014, 9:16 PM   LOS: 1 day   Care during the described time interval was provided by me .  I have reviewed this patient's available data, including medical history, events of note, physical examination, radiology studies and test results as part of my evaluation  Carolyne Littles, MD [phone] Pager

## 2014-09-19 NOTE — Progress Notes (Signed)
Initial Nutrition Assessment  DOCUMENTATION CODES:  Severe malnutrition in context of chronic illness, Underweight  INTERVENTION:  Ensure Enlive (each supplement provides 350kcal and 20 grams of protein)  NUTRITION DIAGNOSIS:  Malnutrition related to chronic illness as evidenced by meal completion < 25%, severe depletion of body fat, severe depletion of muscle mass, percent weight loss (23.8% wt loss x 1 year).   GOAL:  Patient will meet greater than or equal to 90% of their needs   MONITOR:  PO intake, Supplement acceptance, Labs, Weight trends, Skin, I & O's  REASON FOR ASSESSMENT:  Malnutrition Screening Tool    ASSESSMENT: [name] is a 69 y.o. 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 09/2011 S/P ORIF L ankle. S/P ICD January 2015.Patient presents today with complaints of weakness, failure to thrive, poor appetite, weight loss, reported has been going on the last few month, but recently worsened over the last few days, questionable subjective fever, patient afebrile in ED, denies any chills, cough, productive sputum, dysuria, chest pain, reports his shortness of breath at baseline,.  Pt confirms poor appetite and weight loss over the past several months. He is unable to give further details about his poor appetite and weight loss, other than it has been ongoing. Pt complains of urinary pain; RN aware.  RN reports pt ate none of his breakfast this morning. He also refused Nepro supplement. Pt reports he just doesn't have an appetite. He reveals he drinks both Boost and Ensure at home and is amenable to supplements during hospitalization. Wt hx reveals a 23.8% wt loss over the past year. Educated pt on  importance of good PO intake to prpmote healing. Encouraged pt to consume supplements to promote nutritional adequacy. Pt with poor prognosis. Per chart review, pt currently desires aggressive care, but is awaiting palliative care consult to discuss further options.  Labs reviewed. CO2: 17, BUN/Creat: 71/2.58, Gkucose: 149, CBGS: 109-160.   Height:  Ht Readings from Last 1 Encounters:  09/19/14 6' (1.829 m)    Weight:  Wt Readings from Last 1 Encounters:  09/19/14 125 lb 10.6 oz (57 kg)    Ideal Body Weight:  80.9 kg  Wt Readings from Last 10 Encounters:  09/19/14 125 lb 10.6 oz (57 kg)  05/15/14 134 lb 4.2 oz (60.9 kg)  04/27/14 125 lb 1.6 oz (56.745 kg)  02/25/14 150 lb (68.04 kg)  02/06/14 140 lb (63.504 kg)  09/28/13 164 lb (74.39 kg)  06/14/13 178 lb 2.1 oz (80.8 kg)  05/31/13 168 lb (76.204 kg)  05/09/13 177 lb 8 oz (80.513 kg)  02/23/13 176 lb 4 oz (79.946 kg)    BMI:  Body mass index is 17.04 kg/(m^2). Underweight  Estimated Nutritional Needs:  Kcal:  1700-1900  Protein:  85-95 grams  Fluid:  1.7-1.9 L  Skin:  Wound (see comment) (DM ulcer on lt lower leg, stage I pressure ulcer on sacrum)  Diet Order:  Diet regular Room service appropriate?: Yes; Fluid consistency:: Thin  EDUCATION NEEDS:  Education needs addressed   Intake/Output Summary (Last 24 hours) at 09/19/14 1115 Last data filed at 09/19/14 0600  Gross per 24 hour  Intake   2370 ml  Output    175 ml  Net   2195 ml    Last BM:  09/17/14  Oliviana Mcgahee A. Mayford Knife, RD, LDN, CDE Pager: [phone] After hours Pager: [phone]

## 2014-09-19 NOTE — Consult Note (Signed)
Consultation Note Date: 09/19/2014   Patient Name: [name]  DOB: 09-Jan-1946  MRN: [id number]  Age / Sex: 69 y.o., male   PCP: Jackie Plum, MD Referring Physician: Lonia Blood, MD   Reason for Consultation: Establishing goals of care and Psychosocial/spiritual support  Palliative Care Assessment and Plan Summary of Established Goals of Care and Medical Treatment Preferences    Palliative Care Discussion Held Today:  Consult is for review of medical treatment options, clarification of goals of care and end of life issues, disposition and options, and symptom recommendation.  This NP Lorinda Creed reviewed medical records, received report from team, assessed the patient and then meet at the patient's bedside  to discuss diagnosis prognosis, GOC, EOL wishes disposition and options.   A detailed discussion was had today regarding advanced directives.  Concepts specific to code status, artifical feeding and hydration, continued IV antibiotics and rehospitalization was had.  The difference between a aggressive medical intervention path  and a palliative comfort care path for this patient at this time was had.  Values and goals of care important to patient and family were attempted to be elicited.  Concept of Hospice and Palliative Care were discussed  Natural trajectory and expectations at EOL were discussed.  Questions and concerns addressed.  Hard Choices booklet left for review. Family encouraged to call with questions or concerns.  PMT will continue to support holistically.  With patitn's permission  I spoke to his two sisters Lawerance Bach # [phone]. Almira Bar [phone], his home caregiver Nadara Eaton and the [facility] agency [facility] [phone]    Contacts/Participants in Discussion:  Patient   Primary Decision Maker: self   HCPOA: none documented    Code Status/Advance Care Planning:   Partial code status   Symptom Management:    Weakness: medical management of chronic disease                           PT as tolerated   Psycho-social/Spiritual:   Support System: Sibling, 2 sisters and brother and home caregiver  Desire for further Chaplaincy support:yes  Prognosis: < 6 months  Discharge Planning:  Pending outcomes      Chief Complaint/History of Present Illness: Weakness  Primary Diagnoses  Present on Admission:  . Protein-calorie malnutrition, severe . Hyperlipemia . Essential hypertension, benign . Systolic and diastolic CHF, acute on chronic . DVT (deep venous thrombosis) . Smoking . ICD (implantable cardioverter-defibrillator) in place . Acute on chronic renal failure  Palliative Review of Systems: Pain:  no, Dyspnea: yes, Cough: yes, Nutritional status(weight change, appetite, taste disturbance) yes, Oral Symptoms (xerostomia, dysphagia, odynophagia): no, Nausea/Vomiting: no, Constipation/diarrhea: no, Urination problems: yes, Sleep: no, Fatigue: yes, Sedation: no, Cognitive/ memory problems: yes, Anxiety: no, Depression: yes, Concerns/worries:    I have reviewed the medical record, interviewed the patient and family, and examined the patient. The following aspects are pertinent.  Past Medical History  Diagnosis Date  . Venous thromboembolism     DVTs in 2004, 2005, 2007, 8/10, 8/11. IVC filter 2005. Off coumadin 04/2011 secondary to rectal bleeding  . 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 Chronic venous insufficiency   . DM2 (diabetes mellitus, type 2)   . GERD (gastroesophageal reflux disease)   . BPH (benign prostatic hypertrophy)   . Low back pain   . HTN (hypertension)   . HLD (hyperlipidemia)   . NICM (nonischemic  cardiomyopathy) 1. 9/11  2. 2/12    1. Echo septal and apical akinesis, dilated LV, EF 45%, RV normal size and systolic function, EF 43% on Myoview  2. Admitted with decompensated CHF, echo EF 30-35% with diffuse hypokinesis but akinesis of  mid to apical anteroseptal wall and apex, moderate LVH, mild MR, grade I diastolic dysfunction, SPEP/UP negative and HIV negative. Most recent EF 10-15% 04/2011  . Smoking   . CAD (coronary artery disease) 1. 2007  2. 10/11    1. Left heart cath with 40-50% stenosis in small RCA EF 50%  2. Lexiscan myoview EF 43%, global hypokinesis, possible small area of apical ischemia; LHC no angiographic CAD, no LV-gram done due to CKD   2. No angiographic CAD by cath 02/2010  . CKD (chronic kidney disease) stage 2, GFR 60-89 ml/min as of 02/2011  . Headache(784.0)   . Diabetes mellitus   . Blood transfusion   . Anemia   . Syncope     1. in setting of presumed orthostatic Hypotn 09/2011  . Right fibular fracture     1. 09/2011 - resulting from fall/syncope  . Closed right ankle fracture 10/10/2011   History   Social History  . Marital Status: Divorced    Spouse Name: N/A  . Number of Children: 1  . Years of Education: N/A   Occupational History  . security guard     Part time   Social History Main Topics  . Smoking status: Current Some Day Smoker -- 1.00 packs/day for 40 years    Types: Cigarettes    Last Attempt to Quit: 06/30/2011  . Smokeless tobacco: Never Used  . Alcohol Use: No     Comment: occasionally  . Drug Use: No  . Sexual Activity: Not on file   Other Topics Concern  . None   Social History Narrative   Has 1 son.   Family History  Problem Relation Age of Onset  . Coronary artery disease Neg Hx     Premature   Scheduled Meds: . allopurinol  100 mg Oral Daily  . apixaban  2.5 mg Oral BID  . atorvastatin  40 mg Oral q1800  . digoxin  0.125 mg Oral Daily  . docusate sodium  100 mg Oral BID  . feeding supplement (ENSURE ENLIVE)  237 mL Oral TID BM  . insulin aspart  0-9 Units Subcutaneous TID WC  . insulin glargine  14 Units Subcutaneous BID  . pantoprazole  40 mg Oral Daily  . tamsulosin  0.4 mg Oral QPC supper   Continuous Infusions: . DOBUTamine 5 mcg/kg/min  (09/19/14 0938)   PRN Meds:.acetaminophen **OR** acetaminophen, albuterol, ondansetron **OR** ondansetron (ZOFRAN) IV Medications Prior to Admission:  Prior to Admission medications   Medication Sig Start Date End Date Taking? Authorizing Provider  acetaminophen (TYLENOL) 500 MG tablet Take 1,000 mg by mouth every 6 (six) hours as needed for moderate pain (pain).    Yes Historical Provider, MD  allopurinol (ZYLOPRIM) 100 MG tablet Take 1 tablet (100 mg total) by mouth daily. 08/09/14  Yes Dolores Patty, MD  atorvastatin (LIPITOR) 40 MG tablet Take 40 mg by mouth daily at 6 PM.  07/26/14  Yes Historical Provider, MD  clobetasol cream (TEMOVATE) 0.05 % Apply 1 application topically 2 (two) times daily.  07/26/14  Yes Historical Provider, MD  esomeprazole (NEXIUM) 40 MG capsule Take 40 mg by mouth daily at 12 noon.   Yes Historical Provider, MD  famotidine (PEPCID)  20 MG tablet Take 20 mg by mouth daily.   Yes Historical Provider, MD  HYDROcodone-acetaminophen (NORCO/VICODIN) 5-325 MG per tablet Take 1 tablet by mouth every 6 (six) hours as needed. 03/01/14  Yes Roxy Horseman, PA-C  Insulin Glargine (LANTUS SOLOSTAR) 100 UNIT/ML Solostar Pen Inject 25 Units into the skin 2 (two) times daily. Patient taking differently: Inject 20 Units into the skin 2 (two) times daily.  04/29/14  Yes Clydia Llano, MD  lisinopril (PRINIVIL,ZESTRIL) 5 MG tablet Take 5 mg by mouth every evening.   Yes Historical Provider, MD  metolazone (ZAROXOLYN) 2.5 MG tablet Take 2.5 mg by mouth daily.  07/26/14  Yes Historical Provider, MD  nateglinide (STARLIX) 120 MG tablet Take 120 mg by mouth 3 (three) times daily with meals.  06/28/14  Yes Historical Provider, MD  pantoprazole (PROTONIX) 40 MG tablet Take 40 mg by mouth daily.  07/26/14  Yes Historical Provider, MD  tamsulosin (FLOMAX) 0.4 MG CAPS capsule Take 0.4 mg by mouth daily after supper.   Yes Historical Provider, MD  torsemide (DEMADEX) 20 MG tablet Take 20 mg by mouth  daily.  07/26/14  Yes Historical Provider, MD  apixaban (ELIQUIS) 2.5 MG TABS tablet Take 1 tablet (2.5 mg total) by mouth 2 (two) times daily. 08/10/14   Dolores Patty, MD  digoxin (LANOXIN) 0.125 MG tablet Take 0.125 mg by mouth daily.    Historical Provider, MD  eplerenone (INSPRA) 25 MG tablet Take 25 mg by mouth daily.    Historical Provider, MD  hydrALAZINE (APRESOLINE) 10 MG tablet Take 2 tablets (20 mg total) by mouth 3 (three) times daily. 09/01/14   Laurey Morale, MD  isosorbide mononitrate (IMDUR) 30 MG 24 hr tablet Take 30 mg by mouth daily. 12/09/12   Amy D Filbert Schilder, NP  metoprolol succinate (TOPROL-XL) 25 MG 24 hr tablet Take 12.5 mg by mouth 2 (two) times daily.  07/26/14   Historical Provider, MD   No Known Allergies CBC:    Component Value Date/Time   WBC 8.6 09/19/2014 0232   HGB 14.5 09/19/2014 0232   HCT 43.7 09/19/2014 0232   PLT 68* 09/19/2014 0232   MCV 84.2 09/19/2014 0232   NEUTROABS 9.7* 03/01/2014 1327   LYMPHSABS 1.0 03/01/2014 1327   MONOABS 0.6 03/01/2014 1327   EOSABS 0.2 03/01/2014 1327   BASOSABS 0.0 03/01/2014 1327   Comprehensive Metabolic Panel:    Component Value Date/Time   NA 141 09/19/2014 0232   K 4.2 09/19/2014 0232   CL 106 09/19/2014 0232   CO2 17* 09/19/2014 0232   BUN 71* 09/19/2014 0232   CREATININE 2.58* 09/19/2014 0232   GLUCOSE 149* 09/19/2014 0232   CALCIUM 9.2 09/19/2014 0232   AST 119* 09/19/2014 0232   ALT 75* 09/19/2014 0232   ALKPHOS 142* 09/19/2014 0232   BILITOT 1.9* 09/19/2014 0232   PROT 5.2* 09/19/2014 0232   ALBUMIN 2.9* 09/19/2014 0232    Physical Exam: Vital Signs: BP 115/87 mmHg  Pulse 84  Temp(Src) 97.7 F (36.5 C) (Oral)  Resp 16  Ht 6' (1.829 m)  Wt 57 kg (125 lb 10.6 oz)  BMI 17.04 kg/m2  SpO2 98% SpO2: SpO2: 98 % O2 Device: O2 Device: Not Delivered O2 Flow Rate:   Intake/output summary:  Intake/Output Summary (Last 24 hours) at 09/19/14 1405 Last data filed at 09/19/14 1200  Gross per 24 hour    Intake 3080.18 ml  Output   1775 ml  Net 1305.18 ml   LBM:  Baseline Weight: Weight: 57 kg (125 lb 10.6 oz) Most recent weight: Weight: 57 kg (125 lb 10.6 oz)  Exam Findings: General: Alert and Oriented, Chronically  ill appearing HEENT: moist buccal membranes, no exudate CV: RRR  Respiratory: decreased in bases Abdomen:  Distended, firm supra pubic area, nursing notified of need for bladder scan Extremities:  Edema trace BLE Neuro/Psych: Oriented X3, lethargic        Palliative Performance Scale: 30 % at best              Additional Data Reviewed: Recent Labs     09/18/14  1240  09/18/14  1254  09/19/14  0232  WBC  9.6   --   8.6  HGB  15.8  18.7*  14.5  PLT  72*   --   68*  NA  138  137  141  BUN  77*  66*  71*  CREATININE  2.89*  3.00*  2.58*     Time In: 1015 Time Out: 1130 Time Total: 75 min  Greater than 50%  of this time was spent counseling and coordinating care related to the above assessment and plan.  Signed by: Lorinda Creed, NP  Canary Brim, NP  09/19/2014, 2:05 PM   Please contact Palliative Medicine Team phone at [phone] for questions and concerns.

## 2014-09-19 NOTE — Progress Notes (Signed)
Pharmacist Heart Failure Core Measure Documentation  Assessment: ACER BALTZ has an EF documented as 30%.  Rationale: Heart failure patients with left ventricular systolic dysfunction (LVSD) and an EF < 40% should be prescribed an angiotensin converting enzyme inhibitor (ACEI) or angiotensin receptor blocker (ARB) at discharge unless a contraindication is documented in the medical record.  This patient is not currently on an ACEI or ARB for HF.  This note is being placed in the record in order to provide documentation that a contraindication to the use of these agents is present for this encounter.  ACE Inhibitor or Angiotensin Receptor Blocker is contraindicated (specify all that apply)  []   ACEI allergy AND ARB allergy []   Angioedema []   Moderate or severe aortic stenosis []   Hyperkalemia [x]   Hypotension []   Renal artery stenosis [x]   Worsening renal function, preexisting renal disease or dysfunction   [name] 09/19/2014 9:39 AM

## 2014-09-19 NOTE — Progress Notes (Signed)
   09/19/14 1400  Clinical Encounter Type  Visited With Patient  Visit Type Initial;Spiritual support;Social support  Referral From Palliative care team  Spiritual Encounters  Spiritual Needs Emotional  Stress Factors  Patient Stress Factors Exhausted   Chaplain was referred to patient via spiritual care consult from palliative care team. When chaplain visited patient seemed really exhausted. Chaplain spoke briefly with patient. Patient said he was having a bunch of visitors next week and that may be a better time for him to talk. Patient is religious and identifies as Baptist. Chaplain let patient know that he is available this week if he wishes to talk. Chaplain will continue to provide emotional and spiritual care for patient as needed.  Robynne Roat, Tommi Emery, Chaplain  2:26 PM

## 2014-09-19 NOTE — Progress Notes (Addendum)
Advanced Heart Failure Rounding Note   Subjective:    Admitted with increased dyspnea and fatigue. In the ED worsening renal function, elevated LFTs, elevated troponins, elevated lactic acid, sepsis ED pathway was started on the patient, given 2L IVF, IV vancomycin and Zosyn, blood cultures were sent, urine analysis was negative, chest x-ray showing cardiomegaly with no active disease, hospitalist requested to admit the patient for further evaluation.   Last night central line placed with initial CO-OX 57% and CVP. Today down to 48%. CVP 13 SOB at rest.   Creatinine 3.0> 2.58   Objective:   Weight Range:  Vital Signs:   Temp:  [97.4 F (36.3 C)-97.9 F (36.6 C)] 97.9 F (36.6 C) (05/03 0400) Pulse Rate:  [46-128] 123 (05/03 0600) Resp:  [12-35] 12 (05/03 0600) BP: (77-131)/(51-100) 86/66 mmHg (05/03 0600) SpO2:  [96 %-100 %] 98 % (05/03 0600) Last BM Date: 09/17/14  Weight change: There were no vitals filed for this visit.  Intake/Output:   Intake/Output Summary (Last 24 hours) at 09/19/14 0754 Last data filed at 09/19/14 0600  Gross per 24 hour  Intake   2370 ml  Output    175 ml  Net   2195 ml     Physical Exam: CVP 13  General: Cachetic Chronically ill appearing. No acute distress.  HEENT: normal except for poor dentition Neck: supple. JVP to jaw . RIJ TLC Carotids 2+ bilaterally; no bruits. No lymphadenopathy or thryomegaly appreciated. Cor: PMI displaced laterally. Tachy, regular rate and rhythm. 2/6 MR +s3 Lungs: clear Abdomen: Non tender. Good bowel sounds. Non distended Extremities: no cyanosis, clubbing, rash, R and LLE 2+ woodyedema Neuro: alert & orientedx3, cranial nerves grossly intact. Moves all 4 extremities w/o difficulty. Affect pleasant.  Telemetry: Sinus Tach 110-120  Labs: Basic Metabolic Panel:  Recent Labs Lab 09/18/14 1240 09/18/14 1254 09/19/14 0232  NA 138 137 141  K 4.8 4.6 4.2  CL 102 105 106  CO2 19*  --  17*  GLUCOSE  117* 113* 149*  BUN 77* 66* 71*  CREATININE 2.89* 3.00* 2.58*  CALCIUM 10.0  --  9.2    Liver Function Tests:  Recent Labs Lab 09/18/14 1317 09/19/14 0232  AST 210* 119*  ALT 102* 75*  ALKPHOS 180* 142*  BILITOT 2.9* 1.9*  PROT 6.5 5.2*  ALBUMIN 4.0 2.9*   No results for input(s): LIPASE, AMYLASE in the last 168 hours. No results for input(s): AMMONIA in the last 168 hours.  CBC:  Recent Labs Lab 09/18/14 1240 09/18/14 1254 09/19/14 0232  WBC 9.6  --  8.6  HGB 15.8 18.7* 14.5  HCT 47.2 55.0* 43.7  MCV 84.1  --  84.2  PLT 72*  --  68*    Cardiac Enzymes: No results for input(s): CKTOTAL, CKMB, CKMBINDEX, TROPONINI in the last 168 hours.  BNP: BNP (last 3 results)  Recent Labs  05/15/14 1445 09/01/14 1512  BNP 34.0 1480.0*    ProBNP (last 3 results) No results for input(s): PROBNP in the last 8760 hours.    Other results:  Imaging: Dg Chest Port 1 View  09/18/2014   CLINICAL DATA:  Central line placement  EXAM: PORTABLE CHEST - 1 VIEW  COMPARISON:  Chest x-ray from the same day at 1:45 p.m.  FINDINGS: New right IJ central line, tip at the upper cavoatrial junction. No pneumothorax.  Stable positioning of a right ventricular ICD/pacer lead.  Unchanged cardiomegaly and aortic tortuosity. There is no edema, consolidation, effusion, or pneumothorax.  IMPRESSION: 1. New right IJ central line is in good position.  No pneumothorax. 2. Cardiomegaly without failure.   Electronically Signed   By: Marnee Spring M.D.   On: 09/18/2014 20:40   Dg Chest Port 1 View  09/18/2014   CLINICAL DATA:  Shortness of breath and weakness.  EXAM: PORTABLE CHEST - 1 VIEW  COMPARISON:  CT chest 07/20/2014.  PA and lateral chest 08/12/2014.  FINDINGS: Single lead AICD is again seen. There is cardiomegaly but no pulmonary edema. The lungs are clear. No pneumothorax or pleural effusion is identified.  IMPRESSION: Cardiomegaly without acute disease.   Electronically Signed   By: Drusilla Kanner M.D.   On: 09/18/2014 13:58      Medications:     Scheduled Medications: . allopurinol  100 mg Oral Daily  . apixaban  2.5 mg Oral BID  . atorvastatin  40 mg Oral q1800  . digoxin  0.125 mg Oral Daily  . docusate sodium  100 mg Oral BID  . feeding supplement (NEPRO CARB STEADY)  237 mL Oral TID BM  . insulin aspart  0-9 Units Subcutaneous TID WC  . insulin glargine  14 Units Subcutaneous BID  . pantoprazole  40 mg Oral Daily  . tamsulosin  0.4 mg Oral QPC supper     Infusions:     PRN Medications:  acetaminophen **OR** acetaminophen, albuterol, ondansetron **OR** ondansetron (ZOFRAN) IV   Assessment:  1. Cardiogenic shock 2. Acute on chronic systolic HF  3. Acute on chronic renal failure, stage 4 4. Shock liver 5. Severe protein calorie malnutrition 6. Cachexia  Plan/Discussion:   Volume status elevated. CVP 13.  Give 80 mg IV lasix x 1 . CO-OX down to 48%. Hypotensive today. Ideally would use levophed but will try dobutamine 5 mcg. Doubt this will make much difference.   On eliquis 2.5 mg twice a day.   Palliative Care consult pending.   Length of Stay: 1  CLEGG,AMY NP-C  09/19/2014, 7:54 AM  Advanced Heart Failure Team Pager [phone] (M-F; 7a - 4p)  Please contact [facility] for night-coverage after hours (4p -7a ) and weekends on [email or web address]  Patient seen and examined with [name], NP. We discussed all aspects of the encounter. I agree with the assessment and plan as stated above.   He is terminally ill with end-stage HF. Co-ox low. CVP up. I have discussed EOL issue with him. For now he wants aggressive care but is amenable to talking to Palliative Care team. We discussed discharge to SNF vs [facility] and he said he would consider either. (He lives alone at home). Given hypotension and low co-ox will start dobutamine for now but would not plan on using inotropes at discharge if at all possible. Palliative care team consulted. I discussed  with his brother as well.   I suspect this is primarily cardiogenic shock but would continue abx until [facility] come back.   The patient is critically ill with multiple organ systems failure and requires high complexity decision making for assessment and support, frequent evaluation and titration of therapies, application of advanced monitoring technologies and extensive interpretation of multiple databases.   Critical Care Time personally devoted to patient care services described in this note is 35 Minutes.  Newman Waren,MD 8:41 AM

## 2014-09-19 NOTE — Discharge Instructions (Signed)
Information on my medicine - ELIQUIS® (apixaban) ° °This medication education was reviewed with me or my healthcare representative as part of my discharge preparation.  The pharmacist that spoke with me during my hospital stay was:  Caree Wolpert Rhea, RPH ° °Why was Eliquis® prescribed for you? °Eliquis® was prescribed for you to reduce the risk of a blood clot forming that can cause a stroke if you have a medical condition called atrial fibrillation (a type of irregular heartbeat). ° °What do You need to know about Eliquis® ? °Take your Eliquis® TWICE DAILY - one tablet in the morning and one tablet in the evening with or without food. If you have difficulty swallowing the tablet whole please discuss with your pharmacist how to take the medication safely. ° °Take Eliquis® exactly as prescribed by your doctor and DO NOT stop taking Eliquis® without talking to the doctor who prescribed the medication.  Stopping may increase your risk of developing a stroke.  Refill your prescription before you run out. ° °After discharge, you should have regular check-up appointments with your healthcare provider that is prescribing your Eliquis®.  In the future your dose may need to be changed if your kidney function or weight changes by a significant amount or as you get older. ° °What do you do if you miss a dose? °If you miss a dose, take it as soon as you remember on the same day and resume taking twice daily.  Do not take more than one dose of ELIQUIS at the same time to make up a missed dose. ° °Important Safety Information °A possible side effect of Eliquis® is bleeding. You should call your healthcare provider right away if you experience any of the following: °  Bleeding from an injury or your nose that does not stop. °  Unusual colored urine (red or dark brown) or unusual colored stools (red or black). °  Unusual bruising for unknown reasons. °  A serious fall or if you hit your head (even if there is no  bleeding). ° °Some medicines may interact with Eliquis® and might increase your risk of bleeding or clotting while on Eliquis®. To help avoid this, consult your healthcare provider or pharmacist prior to using any new prescription or non-prescription medications, including herbals, vitamins, non-steroidal anti-inflammatory drugs (NSAIDs) and supplements. ° °This website has more information on Eliquis® (apixaban): http://www.eliquis.com/eliquis/home ° °

## 2014-09-19 NOTE — Care Management Note (Signed)
Case Management Note  Patient Details  Name: [name] MRN: [id number] Date of Birth: 05-03-1946  Subjective/Objective:      Adm w malnutrition              Action/Plan:lives alone, pcp dr Cloyd Stagers bonsu   Expected Discharge Date:   (unknown)               Expected Discharge Plan:  Home w Home Health Services  In-House Referral:     Discharge planning Services     Post Acute Care Choice:    Choice offered to:     DME Arranged:    DME Agency:     HH Arranged:  Nurse's Aide HH Agency:     Status of Service:     Medicare Important Message Given:    Date Medicare IM Given:    Medicare IM give by:    Date Additional Medicare IM Given:    Additional Medicare Important Message give by:     If discussed at Long Length of Stay Meetings, dates discussed:    Additional Comments:chart states has aid, hx of adv homecare for hhc, will moniter for dc needs as pt progresses  Hanley Hays, RN 09/19/2014, 10:07 AM

## 2014-09-20 DIAGNOSIS — R52 Pain, unspecified: Secondary | ICD-10-CM

## 2014-09-20 DIAGNOSIS — Z7189 Other specified counseling: Secondary | ICD-10-CM

## 2014-09-20 DIAGNOSIS — R64 Cachexia: Secondary | ICD-10-CM

## 2014-09-20 DIAGNOSIS — Z515 Encounter for palliative care: Secondary | ICD-10-CM

## 2014-09-20 DIAGNOSIS — R0600 Dyspnea, unspecified: Secondary | ICD-10-CM

## 2014-09-20 DIAGNOSIS — R531 Weakness: Secondary | ICD-10-CM

## 2014-09-20 LAB — CBC WITH DIFFERENTIAL/PLATELET
BASOS PCT: 0 % (ref 0–1)
Basophils Absolute: 0 10*3/uL (ref 0.0–0.1)
Eosinophils Absolute: 0 10*3/uL (ref 0.0–0.7)
Eosinophils Relative: 0 % (ref 0–5)
HEMATOCRIT: 39.9 % (ref 39.0–52.0)
Hemoglobin: 13.4 g/dL (ref 13.0–17.0)
LYMPHS PCT: 5 % — AB (ref 12–46)
Lymphs Abs: 0.5 10*3/uL — ABNORMAL LOW (ref 0.7–4.0)
MCH: 28.3 pg (ref 26.0–34.0)
MCHC: 33.6 g/dL (ref 30.0–36.0)
MCV: 84.4 fL (ref 78.0–100.0)
MONO ABS: 0.5 10*3/uL (ref 0.1–1.0)
Monocytes Relative: 5 % (ref 3–12)
NEUTROS ABS: 9.3 10*3/uL — AB (ref 1.7–7.7)
NEUTROS PCT: 90 % — AB (ref 43–77)
PLATELETS: 55 10*3/uL — AB (ref 150–400)
RBC: 4.73 MIL/uL (ref 4.22–5.81)
RDW: 13.8 % (ref 11.5–15.5)
WBC: 10.3 10*3/uL (ref 4.0–10.5)

## 2014-09-20 LAB — MAGNESIUM: Magnesium: 1.8 mg/dL (ref 1.7–2.4)

## 2014-09-20 LAB — COMPREHENSIVE METABOLIC PANEL
ALK PHOS: 116 U/L (ref 38–126)
ALT: 61 U/L (ref 17–63)
AST: 73 U/L — AB (ref 15–41)
Albumin: 2.6 g/dL — ABNORMAL LOW (ref 3.5–5.0)
Anion gap: 13 (ref 5–15)
BILIRUBIN TOTAL: 2.2 mg/dL — AB (ref 0.3–1.2)
BUN: 62 mg/dL — ABNORMAL HIGH (ref 6–20)
CHLORIDE: 102 mmol/L (ref 101–111)
CO2: 24 mmol/L (ref 22–32)
Calcium: 8.9 mg/dL (ref 8.9–10.3)
Creatinine, Ser: 2.14 mg/dL — ABNORMAL HIGH (ref 0.61–1.24)
GFR calc Af Amer: 35 mL/min — ABNORMAL LOW (ref >60)
GFR calc non Af Amer: 30 mL/min — ABNORMAL LOW (ref >60)
Glucose, Bld: 68 mg/dL — ABNORMAL LOW (ref 70–99)
POTASSIUM: 3.1 mmol/L — AB (ref 3.5–5.1)
Sodium: 139 mmol/L (ref 135–145)
Total Protein: 4.7 g/dL — ABNORMAL LOW (ref 6.5–8.1)

## 2014-09-20 LAB — GLUCOSE, CAPILLARY
Glucose-Capillary: 72 mg/dL (ref 70–99)
Glucose-Capillary: 99 mg/dL (ref 70–99)

## 2014-09-20 LAB — CARBOXYHEMOGLOBIN
CARBOXYHEMOGLOBIN: 1.6 % — AB (ref 0.5–1.5)
Methemoglobin: 1 % (ref 0.0–1.5)
O2 Saturation: 64.1 %
Total hemoglobin: 13.4 g/dL — ABNORMAL LOW (ref 13.5–18.0)

## 2014-09-20 MED ORDER — MORPHINE SULFATE 2 MG/ML IJ SOLN
1.0000 mg | INTRAMUSCULAR | Status: DC | PRN
  Administered 2014-09-21: 1 mg via INTRAVENOUS
  Filled 2014-09-20: qty 1

## 2014-09-20 MED ORDER — POTASSIUM CHLORIDE CRYS ER 20 MEQ PO TBCR
40.0000 meq | EXTENDED_RELEASE_TABLET | Freq: Once | ORAL | Status: AC
  Administered 2014-09-20: 40 meq via ORAL
  Filled 2014-09-20: qty 2

## 2014-09-20 MED ORDER — LORAZEPAM 2 MG/ML IJ SOLN: 1.0000 mg | INTRAMUSCULAR | Status: DC | PRN

## 2014-09-20 MED ORDER — DOBUTAMINE IN D5W 4-5 MG/ML-% IV SOLN: 5.0000 ug/kg/min | INTRAVENOUS | Status: DC

## 2014-09-20 MED ORDER — BISACODYL 10 MG RE SUPP: 10.0000 mg | Freq: Every day | RECTAL | Status: DC | PRN

## 2014-09-20 MED ORDER — POTASSIUM CHLORIDE CRYS ER 20 MEQ PO TBCR: 40.0000 meq | EXTENDED_RELEASE_TABLET | Freq: Once | ORAL | Status: DC

## 2014-09-20 NOTE — Progress Notes (Signed)
Advanced Heart Failure Rounding Note   Subjective:    Admitted with increased dyspnea and fatigue. In the ED worsening renal function, elevated LFTs, elevated troponins, elevated lactic acid, sepsis ED pathway was started on the patient, given 2L IVF, IV vancomycin and Zosyn, blood cultures were sent, urine analysis was negative, chest x-ray showing cardiomegaly with no active disease, hospitalist requested to admit the patient for further evaluation.   Initial CO-OX 48% . Yesterday he was started on dobutamine @ 5 mcg.  CO-OX improved today 64%.  Poor urine output. CVP 10. Remains very weak. Denies dyspnea.   Palliative Care Team has seen. Full Consult pending.   Creatinine 3.0> 2.58>2.1    Objective:   Weight Range:  Vital Signs:   Temp:  [97.5 F (36.4 C)-97.7 F (36.5 C)] 97.5 F (36.4 C) (05/04 0400) Pulse Rate:  [84-131] 114 (05/04 0700) Resp:  [13-28] 13 (05/04 0700) BP: (75-139)/(52-106) 94/75 mmHg (05/04 0600) SpO2:  [93 %-100 %] 97 % (05/04 0700) Weight:  [125 lb 10.6 oz (57 kg)] 125 lb 10.6 oz (57 kg) (05/03 0800) Last BM Date: 09/17/14  Weight change: Filed Weights   09/19/14 0800  Weight: 125 lb 10.6 oz (57 kg)    Intake/Output:   Intake/Output Summary (Last 24 hours) at 09/20/14 0751 Last data filed at 09/20/14 0700  Gross per 24 hour  Intake 1211.88 ml  Output   4650 ml  Net -3438.12 ml     Physical Exam: CVP 9 General: Cachetic Chronically ill appearing. No acute distress.  HEENT: normal except for poor dentition Neck: supple. JVP 9. RIJ TLC Carotids 2+ bilaterally; no bruits. No lymphadenopathy or thryomegaly appreciated. Cor: PMI displaced laterally. Tachy, regular rate and rhythm. 2/6 MR +s3 Lungs: clear Abdomen: Non tender. Good bowel sounds. Non distended Extremities: no cyanosis, clubbing, rash, R and LLE tr-1+ woody edema Neuro: alert & orientedx3, cranial nerves grossly intact. Moves all 4 extremities w/o difficulty. Affect  pleasant.  Telemetry: Sinus Tach 110-120  Labs: Basic Metabolic Panel:  Recent Labs Lab 09/18/14 1240 09/18/14 1254 09/19/14 0232 09/20/14 0340  NA 138 137 141 139  K 4.8 4.6 4.2 3.1*  CL 102 105 106 102  CO2 19*  --  17* 24  GLUCOSE 117* 113* 149* 68*  BUN 77* 66* 71* 62*  CREATININE 2.89* 3.00* 2.58* 2.14*  CALCIUM 10.0  --  9.2 8.9  MG  --   --   --  1.8    Liver Function Tests:  Recent Labs Lab 09/18/14 1317 09/19/14 0232 09/20/14 0340  AST 210* 119* 73*  ALT 102* 75* 61  ALKPHOS 180* 142* 116  BILITOT 2.9* 1.9* 2.2*  PROT 6.5 5.2* 4.7*  ALBUMIN 4.0 2.9* 2.6*   No results for input(s): LIPASE, AMYLASE in the last 168 hours. No results for input(s): AMMONIA in the last 168 hours.  CBC:  Recent Labs Lab 09/18/14 1240 09/18/14 1254 09/19/14 0232 09/20/14 0340  WBC 9.6  --  8.6 10.3  NEUTROABS  --   --   --  9.3*  HGB 15.8 18.7* 14.5 13.4  HCT 47.2 55.0* 43.7 39.9  MCV 84.1  --  84.2 84.4  PLT 72*  --  68* PENDING    Cardiac Enzymes: No results for input(s): CKTOTAL, CKMB, CKMBINDEX, TROPONINI in the last 168 hours.  BNP: BNP (last 3 results)  Recent Labs  05/15/14 1445 09/01/14 1512  BNP 34.0 1480.0*    ProBNP (last 3 results) No results for  input(s): PROBNP in the last 8760 hours.    Other results:  Imaging: Dg Chest Port 1 View  09/18/2014   CLINICAL DATA:  Central line placement  EXAM: PORTABLE CHEST - 1 VIEW  COMPARISON:  Chest x-ray from the same day at 1:45 p.m.  FINDINGS: New right IJ central line, tip at the upper cavoatrial junction. No pneumothorax.  Stable positioning of a right ventricular ICD/pacer lead.  Unchanged cardiomegaly and aortic tortuosity. There is no edema, consolidation, effusion, or pneumothorax.  IMPRESSION: 1. New right IJ central line is in good position.  No pneumothorax. 2. Cardiomegaly without failure.   Electronically Signed   By: Marnee Spring M.D.   On: 09/18/2014 20:40   Dg Chest Port 1  View  09/18/2014   CLINICAL DATA:  Shortness of breath and weakness.  EXAM: PORTABLE CHEST - 1 VIEW  COMPARISON:  CT chest 07/20/2014.  PA and lateral chest 08/12/2014.  FINDINGS: Single lead AICD is again seen. There is cardiomegaly but no pulmonary edema. The lungs are clear. No pneumothorax or pleural effusion is identified.  IMPRESSION: Cardiomegaly without acute disease.   Electronically Signed   By: Drusilla Kanner M.D.   On: 09/18/2014 13:58     Medications:     Scheduled Medications: . allopurinol  100 mg Oral Daily  . apixaban  2.5 mg Oral BID  . atorvastatin  40 mg Oral q1800  . digoxin  0.125 mg Oral Daily  . docusate sodium  100 mg Oral BID  . feeding supplement (ENSURE ENLIVE)  237 mL Oral TID BM  . insulin aspart  0-9 Units Subcutaneous TID WC  . insulin glargine  14 Units Subcutaneous BID  . pantoprazole  40 mg Oral Daily  . tamsulosin  0.4 mg Oral QPC supper    Infusions: . DOBUTamine 5 mcg/kg/min (09/19/14 0938)    PRN Medications: acetaminophen **OR** acetaminophen, albuterol, ondansetron **OR** ondansetron (ZOFRAN) IV   Assessment:  1. Cardiogenic shock 2. Acute on chronic systolic HF  3. Acute on chronic renal failure, stage 4 4. Shock liver 5. Severe protein calorie malnutrition 6. Cachexia 7. Hypokalemia   Plan/Discussion:   BP remains soft. On dobutamine at 5 mcg per hour. CO-OX improved. No bb with low output. Supplement K. Renal function improved.   On eliquis 2.5 mg twice a day.   Palliative Care consult pending.   Length of Stay: 2  CLEGG,AMY NP-C  09/20/2014, 7:51 AM  Advanced Heart Failure Team Pager [phone] (M-F; 7a - 4p)  Please contact [facility] for night-coverage after hours (4p -7a ) and weekends on [email or web address]  Remains very weak. But breathing better on dobutamine.   Long talk with him about situation and EOL issues. He wants to be DNR but wants to continue dobutamine for now. Will need to discuss deactivation of ICD. I  would prefer to avoid home inotropes given risk of infection and pro-arrhythmia. Palliative care discussions ongoing.   Anthoney Sheppard,MD 9:47 AM

## 2014-09-20 NOTE — Progress Notes (Signed)
[employer] has been by to turn off ICD but left pacemaker on per md orders

## 2014-09-20 NOTE — Progress Notes (Signed)
Daily Progress Note   Patient Name: [name]       Date: 09/20/2014 DOB: 23-Jun-1945  Age: 69 y.o. MRN#: [id number] Attending Physician: Lonia Blood, MD Primary Care Physician: Jackie Plum, MD Admit Date: 09/18/2014  Reason for Consultation/Follow-up: Establishing goals of care, Non pain symptom management and Psychosocial/spiritual support  Subjective:  Consult is for review of medical treatment options, clarification of goals of care and end of life issues, disposition and options, and symptom recommendation.  This NP Lorinda Creed reviewed medical records, received report from team, assessed the patient and then meet at the patient's bedside along with his three siblings and his pastor  to discuss diagnosis, prognosis, GOC, EOL wishes disposition and options.   A detailed discussion was had today regarding advanced directives.  Concepts specific to code status, artifical feeding and hydration, continued IV antibiotics and rehospitalization was had.  The difference between a aggressive medical intervention path  and a palliative comfort care path for this patient at this time was had.  Values and goals of care important to patient and family were attempted to be elicited.  Concept of Hospice and Palliative Care were discussed  Natural trajectory and expectations at EOL were discussed.  Questions and concerns addressed.   Family encouraged to call with questions or concerns.  PMT will continue to support holistically.   Length of Stay: 2 days  Current Medications: Scheduled Meds:  . digoxin  0.125 mg Oral Daily  . docusate sodium  100 mg Oral BID  . feeding supplement (ENSURE ENLIVE)  237 mL Oral TID BM  . pantoprazole  40 mg Oral Daily    Continuous Infusions: . DOBUTamine      PRN Meds: acetaminophen **OR** acetaminophen, albuterol, LORazepam, morphine injection, ondansetron **OR** ondansetron (ZOFRAN) IV  Palliative Performance Scale: 30 % at  best     Vital Signs: BP 96/67 mmHg  Pulse 108  Temp(Src) 97.3 F (36.3 C) (Oral)  Resp 18  Ht 6' (1.829 m)  Wt 57 kg (125 lb 10.6 oz)  BMI 17.04 kg/m2  SpO2 100% SpO2: SpO2: 100 % O2 Device: O2 Device: Not Delivered O2 Flow Rate:    Intake/output summary:  Intake/Output Summary (Last 24 hours) at 09/20/14 1139 Last data filed at 09/20/14 1000  Gross per 24 hour  Intake  518.9 ml  Output   3750 ml  Net -3231.1 ml   LBM:   Baseline Weight: Weight: 57 kg (125 lb 10.6 oz) Most recent weight: Weight: 57 kg (125 lb 10.6 oz)  Physical Exam:  General: ill appearing, frail, NAD HEENT: noted temporal muscle wasting, moist buccal membranes Skin: warm and dry CVS: tachycardic Resp: decreased BS in bases             Additional Data Reviewed: Recent Labs     09/19/14  0232  09/20/14  0340  WBC  8.6  10.3  HGB  14.5  13.4  PLT  68*  55*  NA  141  139  BUN  71*  62*  CREATININE  2.58*  2.14*     Problem List:  Patient Active Problem List   Diagnosis Date Noted  . Chronic diastolic congestive heart failure   . Essential hypertension   . Arterial hypotension   . Diabetes type 2, controlled   . Tobacco abuse   . Left ventricular mural thrombus   . Protein-calorie malnutrition, severe 09/18/2014  . LV (left ventricular) mural thrombus 08/10/2014  . Weight loss 06/12/2014  .  Severe protein-calorie malnutrition 05/15/2014  . Dehydration 05/15/2014  . Hyperosmolar non-ketotic state in patient with type 2 diabetes mellitus 05/15/2014  . Uncontrolled type 2 DM with hyperosmolar nonketotic hyperglycemia 04/27/2014  . Hypokalemia 04/27/2014  . ARF (acute renal failure) 04/27/2014  . ICD (implantable cardioverter-defibrillator) in place 09/28/2013  . Acute gout 05/18/2013  . Urinary retention 09/08/2012  . Iron deficiency anemia 06/17/2012  . Acute on chronic systolic heart failure 06/15/2012  . Pre-operative cardiovascular examination 09/26/2011  . Right fibular  fracture   . Syncope 09/19/2011  . Fibula fracture, right, closed, initial encounter 09/19/2011  . Orthostatic hypotension 09/19/2011  . Acute on chronic renal failure 09/19/2011  . Abdominal pain 09/02/2011  . Tachycardia 06/23/2011  . Diarrhea 06/21/2011  . BPH (benign prostatic hyperplasia) 06/12/2011  . Cirrhosis 05/19/2011  . Gastric AVM 04/30/2011  . Non-ischemic cardiomyopathy 04/30/2011  . Diabetes mellitus 04/30/2011  . Chronic kidney disease 04/30/2011  . Rectal bleeding 04/29/2011  . Smoking 09/25/2010  . CHF (congestive heart failure) 07/17/2010  . DVT (deep venous thrombosis) 07/17/2010  . Systolic and diastolic CHF, acute on chronic 07/11/2010  . SYSTOLIC HEART FAILURE, CHRONIC 07/11/2010  . EDEMA 03/18/2010  . DYSPNEA 03/18/2010  . Hyperlipemia 02/26/2010  . ECHOCARDIOGRAM, ABNORMAL 02/12/2010  . CORONARY ATHEROSCLEROSIS NATIVE CORONARY ARTERY 01/23/2010  . Essential hypertension, benign 01/17/2010  . HYPERTENSION, UNSPECIFIED 01/17/2010     Palliative Care Assessment & Plan    Code Status:  DNR  Goals of Care:  Full comfort       -no further diagnostics or life prolonging measure       -dc ICD       -medications for symptom management    Community pastor at bedside  3. Symptom Management:   Pain/Dyspnea: Morphine 1 mg IV every 1 hr prn  Anxiety: Ativan 1 mg IV every 4 hrs prn  Dysuria: leave foley for comfort at EOL  4. Palliative Prophylaxis:  Stool Softner: Dulcolax supp prn  5. Prognosis: days to weeks  5. Discharge Planning: Transition to comfort, re-evaluate in 24 hrs, patient is open to hospice facility if indicated   Care plan was discussed with patient and his family at bedside  Thank you for allowing the Palliative Medicine Team to assist in the care of this patient.   Time In: 1100 Time Out: 1200 Total Time  60 min Prolonged Time Billed  no     Greater than 50%  of this time was spent counseling and coordinating care  related to the above assessment and plan.   Canary Brim, NP  09/20/2014, 11:39 AM  Please contact Palliative Medicine Team phone at [phone] for questions and concerns.   Discussed with Dr Sharon Seller and [name] NP

## 2014-09-20 NOTE — Progress Notes (Signed)
[employer] called and made aware physician and pt request to have defibrillator turned off. Rep will be out to turn defib off within the next two hours.

## 2014-09-20 NOTE — Progress Notes (Signed)
  [place] TEAM 1 - Stepdown/ICU TEAM  The patient has had lengthy discussions with the heart failure team and the palliative care team today.  He has made the decision to transition to full comfort care only.  The palliative care team is assisting [place] in transitioning to a hospice home.  I have chosen not to disturb the patient today in that all acute issues have been handled by the consultants for today.  Lonia Blood, MD [facility] For Consults/Admissions - Flow Manager [phone] Office  [phone]  Contact MD directly via text page:      [email or web address]      password [facility]

## 2014-09-21 DIAGNOSIS — I513 Intracardiac thrombosis, not elsewhere classified: Secondary | ICD-10-CM | POA: Diagnosis present

## 2014-09-21 DIAGNOSIS — I9589 Other hypotension: Secondary | ICD-10-CM | POA: Diagnosis present

## 2014-09-21 DIAGNOSIS — IMO0002 Reserved for concepts with insufficient information to code with codable children: Secondary | ICD-10-CM | POA: Diagnosis present

## 2014-09-21 DIAGNOSIS — E1165 Type 2 diabetes mellitus with hyperglycemia: Secondary | ICD-10-CM | POA: Diagnosis present

## 2014-09-21 DIAGNOSIS — I5032 Chronic diastolic (congestive) heart failure: Secondary | ICD-10-CM | POA: Diagnosis present

## 2014-09-21 MED ORDER — DIPHENHYDRAMINE HCL 50 MG/ML IJ SOLN
25.0000 mg | Freq: Once | INTRAMUSCULAR | Status: AC
Start: 2014-09-21 — End: 2014-09-21
  Administered 2014-09-21: 25 mg via INTRAVENOUS
  Filled 2014-09-21: qty 1

## 2014-09-21 MED ORDER — MORPHINE SULFATE (CONCENTRATE) 10 MG/0.5ML PO SOLN
5.0000 mg | ORAL | Status: DC
  Administered 2014-09-21 – 2014-09-22 (×7): 5 mg via ORAL
  Filled 2014-09-21 (×7): qty 0.5

## 2014-09-21 MED ORDER — GI COCKTAIL ~~LOC~~
30.0000 mL | Freq: Once | ORAL | Status: AC
  Administered 2014-09-21: 30 mL via ORAL
  Filled 2014-09-21: qty 30

## 2014-09-21 MED ORDER — DIPHENHYDRAMINE HCL 25 MG PO CAPS
25.0000 mg | ORAL_CAPSULE | ORAL | Status: DC | PRN
  Administered 2014-09-22: 25 mg via ORAL
  Filled 2014-09-21: qty 1

## 2014-09-21 NOTE — Clinical Social Work Note (Signed)
Clinical Social Work Assessment  Patient Details  Name: [name] MRN: [id number] Date of Birth: 1945-06-21  Date of referral:  09/21/14               Reason for consult:  End of Life/Hospice, Discharge Planning, Facility Placement                Permission sought to share information with:  Facility Medical sales representative, Family Supports Permission granted to share information::  Yes, Verbal Permission Granted  Name::     Renee Pain  Relationship::  Sisters  Contact Information:  [phone] and [phone]    Housing/Transportation Living arrangements for the past 2 months:  Single Family Home Source of Information:  Patient, Other (Comment Required) (Family-siblings) Patient Interpreter Needed:  None Criminal Activity/Legal Involvement Pertinent to Current Situation/Hospitalization:  No - Comment as needed Significant Relationships:  Other Family Members Lives with:  Other (Comment) (Caregiver) Do you feel safe going back to the place where you live?    Need for family participation in patient care:  No (Coment) (Request by pt to speak with his family)   Care giving concerns:  Pt and pt family have decided on comfort care--residential hospice    Social Worker assessment / plan: CSW notified by pt nurse that pt sister Britta Mccreedy wanting to speak. CSW visited pt and introduced self and explained role. Pt with no eye contact and quick/blunt responses to CSW questions. Pt difficult to engage but did confirm plan for residential hospice and that CSW should/could speak with his sisters. CSW called Britta Mccreedy with no answer. CSW called Kathie Rhodes who confirmed plan as well. She informed CSW that family would prefer [facility] facility. CSW explained process and Kathie Rhodes confirms second choice would be [facility]. CSW did later get a return phone call from [place] who confirmed all of the above information.    Insurance information:  Managed Medicare PT Recommendations:  Not  assessed at this time Information / Referral to community resources:  Other (Comment Required) (Residential Hospice)    Patient/Family's Response to care:  Pt and pt family in agreement for residential hospice if pt is eligible   Patient/Family's Understanding of and Emotional Response to Diagnosis, Current Treatment, and Prognosis:  Difficult to assess pt understanding because of limited participation in conversation. Pt family seem to have enough of an understanding to adequately cope with transition to comfort care.   Emotional Assessment Appearance:  Appears older than stated age Attitude/Demeanor/Rapport:  Avoidant Affect (typically observed):  Flat, Withdrawn Orientation:  Oriented to Self, Oriented to Place, Oriented to  Time, Oriented to Situation Alcohol / Substance use:  Not Applicable Psych involvement (Current and /or in the community):  No (Comment)  Discharge Needs  Concerns to be addressed:  Discharge Planning Concerns, Care Coordination, Adjustment to Illness Readmission within the last 30 days:    Current discharge risk:  Terminally ill Barriers to Discharge:  No Barriers Identified    Neelam Tiggs, LCSWA [phone]

## 2014-09-21 NOTE — Progress Notes (Signed)
Nutrition Brief Note  Chart reviewed. Pt now transitioning to comfort care.  No further nutrition interventions warranted at this time.  Please re-consult as needed.   Rochelle Larue A. Norelle Runnion, RD, LDN, CDE Pager: [phone] After hours Pager: [phone]  

## 2014-09-21 NOTE — Progress Notes (Signed)
Advanced Heart Failure Rounding Note   Subjective:    Remains weak. Has met with palliative care team and now comfort care. Planning possible d/c to [facility]. ICD and dobutamine are off.   Objective:   Weight Range:  Vital Signs:   Temp:  [97.8 F (36.6 C)] 97.8 F (36.6 C) (05/04 1143) Pulse Rate:  [108-125] 125 (05/04 1400) Resp:  [10-23] 16 (05/05 0000) BP: (87-96)/(67-70) 87/68 mmHg (05/04 1400) SpO2:  [97 %-100 %] 100 % (05/04 1400) Last BM Date: 09/17/14  Weight change: Filed Weights   09/19/14 0800  Weight: 57 kg (125 lb 10.6 oz)    Intake/Output:   Intake/Output Summary (Last 24 hours) at 09/21/14 0804 Last data filed at 09/21/14 0700  Gross per 24 hour  Intake  15.53 ml  Output   1100 ml  Net -1084.47 ml     Physical Exam: General: Cachetic Chronically ill appearing. No acute distress.  HEENT: normal except for poor dentition Neck: supple. JVP jaw. RIJ TLC Carotids 2+ bilaterally; no bruits. No lymphadenopathy or thryomegaly appreciated. Cor: PMI displaced laterally. Tachy, regular rate and rhythm. 2/6 MR +s3 Lungs: clear Abdomen: Non tender. Good bowel sounds. Non distended Extremities: no cyanosis, clubbing, rash, R and LLE tr-1+ woody edema Neuro: alert & orientedx3, cranial nerves grossly intact. Moves all 4 extremities w/o difficulty. Affect pleasant.  Telemetry: Sinus Tach 110-120  Labs: Basic Metabolic Panel:  Recent Labs Lab 09/18/14 1240 09/18/14 1254 09/19/14 0232 09/20/14 0340  NA 138 137 141 139  K 4.8 4.6 4.2 3.1*  CL 102 105 106 102  CO2 19*  --  17* 24  GLUCOSE 117* 113* 149* 68*  BUN 77* 66* 71* 62*  CREATININE 2.89* 3.00* 2.58* 2.14*  CALCIUM 10.0  --  9.2 8.9  MG  --   --   --  1.8    Liver Function Tests:  Recent Labs Lab 09/18/14 1317 09/19/14 0232 09/20/14 0340  AST 210* 119* 73*  ALT 102* 75* 61  ALKPHOS 180* 142* 116  BILITOT 2.9* 1.9* 2.2*  PROT 6.5 5.2* 4.7*  ALBUMIN 4.0 2.9* 2.6*   No results  for input(s): LIPASE, AMYLASE in the last 168 hours. No results for input(s): AMMONIA in the last 168 hours.  CBC:  Recent Labs Lab 09/18/14 1240 09/18/14 1254 09/19/14 0232 09/20/14 0340  WBC 9.6  --  8.6 10.3  NEUTROABS  --   --   --  9.3*  HGB 15.8 18.7* 14.5 13.4  HCT 47.2 55.0* 43.7 39.9  MCV 84.1  --  84.2 84.4  PLT 72*  --  68* 55*    Cardiac Enzymes: No results for input(s): CKTOTAL, CKMB, CKMBINDEX, TROPONINI in the last 168 hours.  BNP: BNP (last 3 results)  Recent Labs  05/15/14 1445 09/01/14 1512  BNP 34.0 1480.0*    ProBNP (last 3 results) No results for input(s): PROBNP in the last 8760 hours.    Other results:  Imaging: No results found.   Medications:     Scheduled Medications: . digoxin  0.125 mg Oral Daily  . docusate sodium  100 mg Oral BID  . feeding supplement (ENSURE ENLIVE)  237 mL Oral TID BM  . pantoprazole  40 mg Oral Daily    Infusions:    PRN Medications: acetaminophen **OR** acetaminophen, albuterol, bisacodyl, LORazepam, morphine injection, ondansetron **OR** ondansetron (ZOFRAN) IV   Assessment:   1. Cardiogenic shock 2. Acute on chronic systolic HF  3. Acute on chronic  renal failure, stage 4 4. Shock liver 5. Severe protein calorie malnutrition 6. Cachexia 7. Hypokalemia   Plan/Discussion:    He is now comfort care only. Agree with transfer to [facility] if available. Start morphine and benzos as needed.   Gerren Hoffmeier,MD 8:04 AM

## 2014-09-21 NOTE — Progress Notes (Addendum)
[facility] TEAM 1 - Stepdown/ICU TEAM Progress Note  SHILO PAUWELS ZOX:[id number] DOB: [birth year] DOA: 09/18/2014 PCP: Jackie Plum, MD  Admit HPI / Brief Narrative: [name] is a 69 y.o. BM PMHx  chronic systolic CHF secondary to nonischemic cardiomyopathy, EF 30-35%, hypertension, anemia, CKD, upper GI bleed, rectal bleeding, duodenal AVM, DVT, Coumadin stopped secondary to GI bleed,(now on Pradaxa), IVC filter, GERD, BPD, Urinary Retention, and chronic low back. 09/2011 S/P ORIF L ankle. S/P ICD January 2015.   Patient presents today with complaints of weakness, failure to thrive, poor appetite, weight loss, reported has been going on the last few month, but recently worsened over the last few days, questionable subjective fever, patient afebrile in ED, denies any chills, cough, productive sputum, dysuria, chest pain, reports his shortness of breath at baseline,. Workup in ED was significant for worsening renal function, elevated LFTs, elevated troponins, elevated lactic acid, sepsis ED pathway was started on the patient, given IV vancomycin and Zosyn, blood cultures were sent, urine analysis was negative, chest x-ray showing cardiomegaly with no active disease, hospitalist requested to admit the patient for further evaluation  HPI/Subjective: 5/4 A/O 4, negative CP, negative SOB. States that he and his family did agree to comfort care. We are waiting to hear from hospice of [facility] which is there first choice, or [facility] which is her second choice.    Assessment/Plan: Systolic CHF/low cardiac output failure - Status post AICD -Will continue digoxin 0.125 mg daily, for comfort. -Comfort care  Acute on chronic renal failure baseline Cr ~1.33-1.8 - Hold all nephrotoxic medication -Hold BP medication, patient currently hypotensive -Comfort care  Hypertension/hypotension - Hold all BP medication secondary to patient's continued hypotension.  -Comfort  care  protein calorie malnutrition severe - Continue ensure  TID    Diabetes mellitus Type 2 uncontrolled -05/16/14 hemoglobin A1c= 14.2 -Of diabetic control medication stopped secondary to-Comfort care  Estimated DVT, left ventricular mural thrombus - Status post IVC, continue with liquids -Comfort care   Code Status: -Comfort care Family Communication: no family present at time of exam Disposition Plan: Discharged to inpatient hospice    Consultants: (CHF team) Dr. Bevelyn Buckles Bensimhon,  Procedure/Significant Events: NA   Culture   Antibiotics:   DVT prophylaxis:    Devices    LINES / TUBES:      Continuous Infusions:    Objective: VITAL SIGNS: Temp: 97.2 F (36.2 C) (05/05 1130) Temp Source: Axillary (05/05 1130) BP: 130/52 mmHg (05/05 1200) Pulse Rate: 125 (05/05 1100) SPO2; FIO2:   Intake/Output Summary (Last 24 hours) at 09/21/14 1646 Last data filed at 09/21/14 1300  Gross per 24 hour  Intake    250 ml  Output    750 ml  Net   -500 ml     Exam: General: A/O 4, NAD, cachectic, No acute respiratory distress Lungs:  Cardiovascular:  Abdomen:  Extremities:    Data Reviewed: Basic Metabolic Panel:  Recent Labs Lab 09/18/14 1240 09/18/14 1254 09/19/14 0232 09/20/14 0340  NA 138 137 141 139  K 4.8 4.6 4.2 3.1*  CL 102 105 106 102  CO2 19*  --  17* 24  GLUCOSE 117* 113* 149* 68*  BUN 77* 66* 71* 62*  CREATININE 2.89* 3.00* 2.58* 2.14*  CALCIUM 10.0  --  9.2 8.9  MG  --   --   --  1.8   Liver Function Tests:  Recent Labs Lab 09/18/14 1317 09/19/14 0232 09/20/14 0340  AST 210* 119*  73*  ALT 102* 75* 61  ALKPHOS 180* 142* 116  BILITOT 2.9* 1.9* 2.2*  PROT 6.5 5.2* 4.7*  ALBUMIN 4.0 2.9* 2.6*   No results for input(s): LIPASE, AMYLASE in the last 168 hours. No results for input(s): AMMONIA in the last 168 hours. CBC:  Recent Labs Lab 09/18/14 1240 09/18/14 1254 09/19/14 0232 09/20/14 0340  WBC 9.6  --   8.6 10.3  NEUTROABS  --   --   --  9.3*  HGB 15.8 18.7* 14.5 13.4  HCT 47.2 55.0* 43.7 39.9  MCV 84.1  --  84.2 84.4  PLT 72*  --  68* 55*   Cardiac Enzymes: No results for input(s): CKTOTAL, CKMB, CKMBINDEX, TROPONINI in the last 168 hours. BNP (last 3 results)  Recent Labs  05/15/14 1445 09/01/14 1512  BNP 34.0 1480.0*    ProBNP (last 3 results) No results for input(s): PROBNP in the last 8760 hours.  CBG:  Recent Labs Lab 09/19/14 1204 09/19/14 1632 09/19/14 2116 09/20/14 0801 09/20/14 1153  GLUCAP 193* 138* 102* 72 99    Recent Results (from the past 240 hour(s))  Blood culture (routine x 2)     Status: None (Preliminary result)   Collection Time: 09/18/14  2:25 PM  Result Value Ref Range Status   Specimen Description BLOOD RIGHT ARM  Final   Special Requests BOTTLES DRAWN AEROBIC ONLY 6CC  Final   Culture   Final           BLOOD CULTURE RECEIVED NO GROWTH TO DATE CULTURE WILL BE HELD FOR 5 DAYS BEFORE ISSUING A FINAL NEGATIVE REPORT Performed at [facility]    Report Status PENDING  Incomplete  MRSA PCR Screening     Status: None   Collection Time: 09/18/14  6:38 PM  Result Value Ref Range Status   MRSA by PCR NEGATIVE NEGATIVE Final    Comment:        The GeneXpert MRSA Assay (FDA approved for NASAL specimens only), is one component of a comprehensive MRSA colonization surveillance program. It is not intended to diagnose MRSA infection nor to guide or monitor treatment for MRSA infections.   Blood culture (routine x 2)     Status: None (Preliminary result)   Collection Time: 09/18/14  7:55 PM  Result Value Ref Range Status   Specimen Description BLOOD CENTRAL LINE  Final   Special Requests BOTTLES DRAWN AEROBIC AND ANAEROBIC  Final   Culture   Final           BLOOD CULTURE RECEIVED NO GROWTH TO DATE CULTURE WILL BE HELD FOR 5 DAYS BEFORE ISSUING A FINAL NEGATIVE REPORT Performed at [facility]    Report Status PENDING   Incomplete     Studies:  Recent x-ray studies have been reviewed in detail by the Attending Physician  Scheduled Meds:  Scheduled Meds: . digoxin  0.125 mg Oral Daily  . docusate sodium  100 mg Oral BID  . feeding supplement (ENSURE ENLIVE)  237 mL Oral TID BM  . morphine CONCENTRATE  5 mg Oral Q4H    Time spent on care of this patient: 40 mins   Evone Arseneau, Roselind Messier , MD  [facility] Office  [phone] Pager - [phone]  On-Call/Text Page:      [email or web address]      password [email or web address]  If 7PM-7AM, please contact night-coverage [email or web address] Password [email or web address] 09/21/2014, 4:46 PM   LOS: 3 days   Care during the described time  interval was provided by me .  I have reviewed this patient's available data, including medical history, events of note, physical examination, radiology studies and test results as part of my evaluation  Carolyne Littles, MD [phone] Pager

## 2014-09-21 NOTE — Care Management Note (Signed)
Case Management Note  Patient Details  Name: [name] MRN: [id number] Date of Birth: 03-08-1946  Subjective/Objective:                    Action/Plan:   Expected Discharge Date:   (unknown)               Expected Discharge Plan:  Home w Home Health Services  In-House Referral:  Hospice / Palliative Care  Discharge planning Services     Post Acute Care Choice:    Choice offered to:     DME Arranged:    DME Agency:     HH Arranged:  Nurse's Aide HH Agency:     Status of Service:     Medicare Important Message Given:  Yes Date Medicare IM Given:  09/21/14 Medicare IM give by:  debbie Arian Mcquitty rn,bsn Date Additional Medicare IM Given:    Additional Medicare Important Message give by:     If discussed at Long Length of Stay Meetings, dates discussed:    Additional Comments:im left for pt on 5/5.  Hanley Hays, RN 09/21/2014, 9:53 AM

## 2014-09-21 NOTE — Progress Notes (Signed)
Daily Progress Note   Patient Name: [name]       Date: 09/21/2014 DOB: [birth year]  Age: 69 y.o. MRN#: [id number] Attending Physician: Drema Dallas, MD Primary Care Physician: Jackie Plum, MD Admit Date: 09/18/2014  Reason for Consultation/Follow-up: Establishing goals of care, Non pain symptom management and Psychosocial/spiritual support  Subjective: Patient verbalizes comfort with his decision for a full comfort path.  His brother Dimas Aguas at bedside offers support and speaks to entire family support of patient's decision.  All are hopeful for hospice facility.  Patient reports overall discomfort and underlying pain.  Little po intake, increased letahrgy  Length of Stay: 3 days  Current Medications: Scheduled Meds:  . digoxin  0.125 mg Oral Daily  . docusate sodium  100 mg Oral BID  . feeding supplement (ENSURE ENLIVE)  237 mL Oral TID BM  . morphine CONCENTRATE  5 mg Oral Q4H    Continuous Infusions:    PRN Meds: acetaminophen **OR** acetaminophen, albuterol, bisacodyl, LORazepam, morphine injection, ondansetron **OR** ondansetron (ZOFRAN) IV  Palliative Performance Scale: 30 % at best     Vital Signs: BP 87/68 mmHg  Pulse 125  Temp(Src) 97.2 F (36.2 C) (Axillary)  Resp 24  Ht 6' (1.829 m)  Wt 57 kg (125 lb 10.6 oz)  BMI 17.04 kg/m2  SpO2 100% SpO2: SpO2: 100 % O2 Device: O2 Device: Not Delivered O2 Flow Rate:    Intake/output summary:   Intake/Output Summary (Last 24 hours) at 09/21/14 1247 Last data filed at 09/21/14 1100  Gross per 24 hour  Intake  152.1 ml  Output    750 ml  Net -597.9 ml   LBM:   Baseline Weight: Weight: 57 kg (125 lb 10.6 oz) Most recent weight: Weight: 57 kg (125 lb 10.6 oz)  Physical Exam:  General: ill appearing, frail, NAD HEENT: noted temporal muscle wasting, moist buccal membranes Skin: warm and dry CVS: tachycardic Resp: decreased BS in bases             Additional Data Reviewed: Recent Labs   09/19/14  0232  09/20/14  0340  WBC  8.6  10.3  HGB  14.5  13.4  PLT  68*  55*  NA  141  139  BUN  71*  62*  CREATININE  2.58*  2.14*     Problem List:  Patient Active Problem List   Diagnosis Date Noted  . DNR (do not resuscitate) discussion 09/20/2014  . Weakness generalized 09/20/2014  . Palliative care encounter 09/20/2014  . Dyspnea 09/20/2014  . Pain, generalized 09/20/2014  . Chronic diastolic congestive heart failure   . Essential hypertension   . Arterial hypotension   . Diabetes type 2, controlled   . Tobacco abuse   . Left ventricular mural thrombus   . Protein-calorie malnutrition, severe 09/18/2014  . LV (left ventricular) mural thrombus 08/10/2014  . Weight loss 06/12/2014  . Severe protein-calorie malnutrition 05/15/2014  . Dehydration 05/15/2014  . Hyperosmolar non-ketotic state in patient with type 2 diabetes mellitus 05/15/2014  . Uncontrolled type 2 DM with hyperosmolar nonketotic hyperglycemia 04/27/2014  . Hypokalemia 04/27/2014  . ARF (acute renal failure) 04/27/2014  . ICD (implantable cardioverter-defibrillator) in place 09/28/2013  . Acute gout 05/18/2013  . Urinary retention 09/08/2012  . Iron deficiency anemia 06/17/2012  . Acute on chronic systolic heart failure 06/15/2012  . Pre-operative cardiovascular examination 09/26/2011  . Right fibular fracture   . Syncope 09/19/2011  . Fibula fracture, right, closed, initial encounter  09/19/2011  . Orthostatic hypotension 09/19/2011  . Acute on chronic renal failure 09/19/2011  . Abdominal pain 09/02/2011  . Tachycardia 06/23/2011  . Diarrhea 06/21/2011  . BPH (benign prostatic hyperplasia) 06/12/2011  . Cirrhosis 05/19/2011  . Gastric AVM 04/30/2011  . Non-ischemic cardiomyopathy 04/30/2011  . Diabetes mellitus 04/30/2011  . Chronic kidney disease 04/30/2011  . Rectal bleeding 04/29/2011  . Smoking 09/25/2010  . CHF (congestive heart failure) 07/17/2010  . DVT (deep venous thrombosis)  07/17/2010  . Systolic and diastolic CHF, acute on chronic 07/11/2010  . SYSTOLIC HEART FAILURE, CHRONIC 07/11/2010  . EDEMA 03/18/2010  . DYSPNEA 03/18/2010  . Hyperlipemia 02/26/2010  . ECHOCARDIOGRAM, ABNORMAL 02/12/2010  . CORONARY ATHEROSCLEROSIS NATIVE CORONARY ARTERY 01/23/2010  . Essential hypertension, benign 01/17/2010  . HYPERTENSION, UNSPECIFIED 01/17/2010     Palliative Care Assessment & Plan    Code Status:  DNR  Goals of Care:   Full comfort       -no further diagnostics or life prolonging measure       -dc ICD       -medications for symptom management    Community pastor at bedside  3. Symptom Management:   Pain/Dyspnea: Morphine 1 mg IV every 1 hr prn            Schedule Roxanol 5 mg po/sl every 4 hrs   Anxiety: Ativan 1 mg IV every 4 hrs prn  Dysuria: leave foley for comfort at EOL  4. Palliative Prophylaxis:  Stool Softner: Dulcolax supp prn  5. Prognosis: days to weeks  5. Discharge Planning: Transition to comfort, referral to hospice facility, will write for choice   Care plan was discussed with patient and his family at bedside  Thank you for allowing the Palliative Medicine Team to assist in the care of this patient.   Time In: 1135 Time Out: 1200 Total Time  25 min Prolonged Time Billed  no     Greater than 50%  of this time was spent counseling and coordinating care related to the above assessment and plan.   Canary Brim, NP  09/21/2014, 12:47 PM  Please contact Palliative Medicine Team phone at [phone] for questions and concerns.    Discussed with [name] NP

## 2014-09-22 DIAGNOSIS — I5033 Acute on chronic diastolic (congestive) heart failure: Secondary | ICD-10-CM | POA: Diagnosis present

## 2014-09-22 DIAGNOSIS — R570 Cardiogenic shock: Secondary | ICD-10-CM | POA: Insufficient documentation

## 2014-09-22 DIAGNOSIS — R64 Cachexia: Secondary | ICD-10-CM | POA: Diagnosis present

## 2014-09-22 LAB — GLUCOSE, CAPILLARY
GLUCOSE-CAPILLARY: 78 mg/dL (ref 70–99)
Glucose-Capillary: 83 mg/dL (ref 70–99)

## 2014-09-22 MED ORDER — MORPHINE SULFATE (CONCENTRATE) 10 MG/0.5ML PO SOLN: 5.0000 mg | ORAL | Status: AC

## 2014-09-22 MED ORDER — ENSURE ENLIVE PO LIQD: 237.0000 mL | Freq: Three times a day (TID) | ORAL | Status: AC

## 2014-09-22 MED ORDER — BISACODYL 10 MG RE SUPP: 10.0000 mg | Freq: Every day | RECTAL | Status: AC | PRN

## 2014-09-22 MED ORDER — DIPHENHYDRAMINE HCL 25 MG PO CAPS: 25.0000 mg | ORAL_CAPSULE | ORAL | Status: AC | PRN

## 2014-09-22 MED ORDER — MORPHINE SULFATE 2 MG/ML IJ SOLN: 1.0000 mg | INTRAMUSCULAR | Status: AC | PRN

## 2014-09-22 MED ORDER — ALBUTEROL SULFATE (2.5 MG/3ML) 0.083% IN NEBU: 2.5000 mg | INHALATION_SOLUTION | RESPIRATORY_TRACT | Status: AC | PRN

## 2014-09-22 MED ORDER — DOCUSATE SODIUM 100 MG PO CAPS: 100.0000 mg | ORAL_CAPSULE | Freq: Two times a day (BID) | ORAL | Status: AC

## 2014-09-22 MED ORDER — LORAZEPAM 2 MG/ML IJ SOLN: 1.0000 mg | INTRAMUSCULAR | Status: AC | PRN

## 2014-09-22 MED ORDER — ONDANSETRON HCL 4 MG/2ML IJ SOLN: 4.0000 mg | Freq: Four times a day (QID) | INTRAMUSCULAR | Status: AC | PRN

## 2014-09-22 NOTE — Progress Notes (Signed)
CSW (Clinical Child psychotherapist) faxed [employer] summary to [facility] and prepared pt [employer]. CSW arranged for non-emergent ambulance. Pt, pt family, pt nurse, facility aware. CSW signing off.  Markela Wee, LCSWA [phone]

## 2014-09-22 NOTE — Discharge Summary (Addendum)
Physician Discharge Summary  DAMEIAN CRISMAN ZOX:[id number] DOB: 12/14/1945 DOA: 09/18/2014  PCP: Jackie Plum, MD  Admit date: 09/18/2014 Discharge date: 09/22/2014  Time spent: 40 minutes  Recommendations for Outpatient Follow-up:  Systolic CHF/low cardiac output failure - Status post AICD -Will continue digoxin 0.125 mg daily, for comfort. -Comfort care  Acute on chronic renal failure baseline Cr ~1.33-1.8 - Hold all nephrotoxic medication -Hold BP medication, patient currently hypotensive -Comfort care  Hypertension/hypotension - Hold all BP medication secondary to patient's continued hypotension.  -Comfort care  protein calorie malnutrition severe - Continue Ensure TID    Diabetes mellitus Type 2 uncontrolled -05/16/14 hemoglobin A1c= 14.2 -Diabetic control medication stopped secondary to-Comfort care  Estimated DVT, left ventricular mural thrombus - Status post IVC, continue with liquids -Comfort care    Discharge Diagnoses:  Active Problems:   Hyperlipemia   Essential hypertension, benign   Systolic and diastolic CHF, acute on chronic   CHF (congestive heart failure)   DVT (deep venous thrombosis)   Smoking   Diabetes mellitus   Acute on chronic renal failure   ICD (implantable cardioverter-defibrillator) in place   Protein-calorie malnutrition, severe   Chronic diastolic congestive heart failure   Essential hypertension   Arterial hypotension   Diabetes type 2, controlled   Tobacco abuse   Left ventricular mural thrombus   DNR (do not resuscitate) discussion   Weakness generalized   Palliative care encounter   Dyspnea   Pain, generalized   Chronic diastolic CHF (congestive heart failure)   Other specified hypotension   Diabetes type 2, uncontrolled   Mural thrombus of left ventricle   Cachexia   Diastolic CHF, acute on chronic   Discharge Condition: Guarded  Diet recommendation: Regular  Filed Weights   09/19/14 0800  Weight: 57 kg  (125 lb 10.6 oz)    History of present illness:  [name] is a 69 y.o. BM PMHx chronic and diastolic CHF secondary to nonischemic cardiomyopathy, EF 30-35%, hypertension, anemia, CKD, upper GI bleed, rectal bleeding, duodenal AVM, DVT, Coumadin stopped secondary to GI bleed,(now on Pradaxa), IVC filter, GERD, BPD, Urinary Retention, and chronic low back. 09/2011 S/P ORIF L ankle. S/P ICD January 2015.   Patient presents today with complaints of weakness, failure to thrive, poor appetite, weight loss, reported has been going on the last few month, but recently worsened over the last few days, questionable subjective fever, patient afebrile in ED, denies any chills, cough, productive sputum, dysuria, chest pain, reports his shortness of breath at baseline,. Workup in ED was significant for worsening renal function, elevated LFTs, elevated troponins, elevated lactic acid, sepsis ED pathway was started on the patient, given IV vancomycin and Zosyn, blood cultures were sent, urine analysis was negative, chest x-ray showing cardiomegaly with no active disease, hospitalist requested to admit the patient for further evaluation During patient's stay was seen by heart failure team which had an extensive talk with patient and patient's family regarding his worsening multisystem organ failure. Patient was counseled that he did reach the maximum level treatment for his heart failure. At this point patient and family decided to convert to comfort care.   Consultants: (CHF team) Dr. Dolores Patty,     Discharge Exam: Filed Vitals:   09/21/14 1130 09/21/14 1200 09/21/14 2000 09/22/14 0747  BP:  130/52 96/63 84/61   Pulse:   121 125  Temp: 97.2 F (36.2 C)  97.6 F (36.4 C) 97.7 F (36.5 C)  TempSrc: Axillary  Oral Oral  Resp:  Height:      Weight:      SpO2:   100% 100%    General: A/O 4, NAD, cachectic, No acute respiratory distress Lungs:  Cardiovascular:  Abdomen:   Extremities:   Discharge Instructions     Medication List    STOP taking these medications        allopurinol 100 MG tablet  Commonly known as:  ZYLOPRIM     apixaban 2.5 MG Tabs tablet  Commonly known as:  ELIQUIS     atorvastatin 40 MG tablet  Commonly known as:  LIPITOR     clobetasol cream 0.05 %  Commonly known as:  TEMOVATE     eplerenone 25 MG tablet  Commonly known as:  INSPRA     esomeprazole 40 MG capsule  Commonly known as:  NEXIUM     famotidine 20 MG tablet  Commonly known as:  PEPCID     hydrALAZINE 10 MG tablet  Commonly known as:  APRESOLINE     HYDROcodone-acetaminophen 5-325 MG per tablet  Commonly known as:  NORCO/VICODIN     Insulin Glargine 100 UNIT/ML Solostar Pen  Commonly known as:  LANTUS SOLOSTAR     isosorbide mononitrate 30 MG 24 hr tablet  Commonly known as:  IMDUR     lisinopril 5 MG tablet  Commonly known as:  PRINIVIL,ZESTRIL     metolazone 2.5 MG tablet  Commonly known as:  ZAROXOLYN     metoprolol succinate 25 MG 24 hr tablet  Commonly known as:  TOPROL-XL     nateglinide 120 MG tablet  Commonly known as:  STARLIX     pantoprazole 40 MG tablet  Commonly known as:  PROTONIX     torsemide 20 MG tablet  Commonly known as:  DEMADEX      TAKE these medications        acetaminophen 500 MG tablet  Commonly known as:  TYLENOL  Take 1,000 mg by mouth every 6 (six) hours as needed for moderate pain (pain).     albuterol (2.5 MG/3ML) 0.083% nebulizer solution  Commonly known as:  PROVENTIL  Take 3 mLs (2.5 mg total) by nebulization every 2 (two) hours as needed for wheezing.     bisacodyl 10 MG suppository  Commonly known as:  DULCOLAX  Place 1 suppository (10 mg total) rectally daily as needed for mild constipation.     digoxin 0.125 MG tablet  Commonly known as:  LANOXIN  Take 0.125 mg by mouth daily.     diphenhydrAMINE 25 mg capsule  Commonly known as:  BENADRYL  Take 1 capsule (25 mg total) by mouth every  4 (four) hours as needed for itching.     docusate sodium 100 MG capsule  Commonly known as:  COLACE  Take 1 capsule (100 mg total) by mouth 2 (two) times daily.     feeding supplement (ENSURE ENLIVE) Liqd  Take 237 mLs by mouth 3 (three) times daily between meals.     LORazepam 2 MG/ML injection  Commonly known as:  ATIVAN  Inject 0.5 mLs (1 mg total) into the vein every 4 (four) hours as needed for anxiety.     morphine 2 MG/ML injection  Inject 0.5 mLs (1 mg total) into the vein every hour as needed (dyspnea).     morphine CONCENTRATE 10 MG/0.5ML Soln concentrated solution  Take 0.25 mLs (5 mg total) by mouth every 4 (four) hours.     ondansetron 4 MG/2ML  Soln injection  Commonly known as:  ZOFRAN  Inject 2 mLs (4 mg total) into the vein every 6 (six) hours as needed for nausea.     tamsulosin 0.4 MG Caps capsule  Commonly known as:  FLOMAX  Take 0.4 mg by mouth daily after supper.       No Known Allergies Follow-up Information    Follow up with OSEI-BONSU,GEORGE, MD.   Specialty:  Internal Medicine   Why:  As needed   Contact information:   [address] [place] [address] [phone]        The results of significant diagnostics from this hospitalization (including imaging, microbiology, ancillary and laboratory) are listed below for reference.    Significant Diagnostic Studies: Dg Chest Port 1 View  09/18/2014   CLINICAL DATA:  Central line placement  EXAM: PORTABLE CHEST - 1 VIEW  COMPARISON:  Chest x-ray from the same day at 1:45 p.m.  FINDINGS: New right IJ central line, tip at the upper cavoatrial junction. No pneumothorax.  Stable positioning of a right ventricular ICD/pacer lead.  Unchanged cardiomegaly and aortic tortuosity. There is no edema, consolidation, effusion, or pneumothorax.  IMPRESSION: 1. New right IJ central line is in good position.  No pneumothorax. 2. Cardiomegaly without failure.   Electronically Signed   By: Marnee Spring M.D.   On: 09/18/2014 20:40   Dg Chest Port 1 View  09/18/2014   CLINICAL DATA:  Shortness of breath and weakness.  EXAM: PORTABLE CHEST - 1 VIEW  COMPARISON:  CT chest 07/20/2014.  PA and lateral chest 08/12/2014.  FINDINGS: Single lead AICD is again seen. There is cardiomegaly but no pulmonary edema. The lungs are clear. No pneumothorax or pleural effusion is identified.  IMPRESSION: Cardiomegaly without acute disease.   Electronically Signed   By: Drusilla Kanner M.D.   On: 09/18/2014 13:58    Microbiology: Recent Results (from the past 240 hour(s))  Blood culture (routine x 2)     Status: None (Preliminary result)   Collection Time: 09/18/14  2:25 PM  Result Value Ref Range Status   Specimen Description BLOOD RIGHT ARM  Final   Special Requests BOTTLES DRAWN AEROBIC ONLY 6CC  Final   Culture   Final           BLOOD CULTURE RECEIVED NO GROWTH TO DATE CULTURE WILL BE HELD FOR 5 DAYS BEFORE ISSUING A FINAL NEGATIVE REPORT Performed at [facility]    Report Status PENDING  Incomplete  MRSA PCR Screening     Status: None   Collection Time: 09/18/14  6:38 PM  Result Value Ref Range Status   MRSA by PCR NEGATIVE NEGATIVE Final    Comment:        The GeneXpert MRSA Assay (FDA approved for NASAL specimens only), is one component of a comprehensive MRSA colonization surveillance program. It is not intended to diagnose MRSA infection nor to guide or monitor treatment for MRSA infections.   Blood culture (routine x 2)     Status: None (Preliminary result)   Collection Time: 09/18/14  7:55 PM  Result Value Ref Range Status   Specimen Description BLOOD CENTRAL LINE  Final   Special Requests BOTTLES DRAWN AEROBIC AND ANAEROBIC  Final   Culture   Final           BLOOD CULTURE RECEIVED NO GROWTH TO DATE CULTURE WILL BE HELD FOR 5 DAYS BEFORE ISSUING A FINAL NEGATIVE REPORT Performed at [facility]    Report Status PENDING  Incomplete     Labs: Basic  Metabolic Panel:  Recent Labs Lab 09/18/14 1240 09/18/14 1254 09/19/14 0232 09/20/14 0340  NA 138 137 141 139  K 4.8 4.6 4.2 3.1*  CL 102 105 106 102  CO2 19*  --  17* 24  GLUCOSE 117* 113* 149* 68*  BUN 77* 66* 71* 62*  CREATININE 2.89* 3.00* 2.58* 2.14*  CALCIUM 10.0  --  9.2 8.9  MG  --   --   --  1.8   Liver Function Tests:  Recent Labs Lab 09/18/14 1317 09/19/14 0232 09/20/14 0340  AST 210* 119* 73*  ALT 102* 75* 61  ALKPHOS 180* 142* 116  BILITOT 2.9* 1.9* 2.2*  PROT 6.5 5.2* 4.7*  ALBUMIN 4.0 2.9* 2.6*   No results for input(s): LIPASE, AMYLASE in the last 168 hours. No results for input(s): AMMONIA in the last 168 hours. CBC:  Recent Labs Lab 09/18/14 1240 09/18/14 1254 09/19/14 0232 09/20/14 0340  WBC 9.6  --  8.6 10.3  NEUTROABS  --   --   --  9.3*  HGB 15.8 18.7* 14.5 13.4  HCT 47.2 55.0* 43.7 39.9  MCV 84.1  --  84.2 84.4  PLT 72*  --  68* 55*   Cardiac Enzymes: No results for input(s): CKTOTAL, CKMB, CKMBINDEX, TROPONINI in the last 168 hours. BNP: BNP (last 3 results)  Recent Labs  05/15/14 1445 09/01/14 1512  BNP 34.0 1480.0*    ProBNP (last 3 results) No results for input(s): PROBNP in the last 8760 hours.  CBG:  Recent Labs Lab 09/19/14 1632 09/19/14 2116 09/20/14 0801 09/20/14 1153 09/22/14 0746  GLUCAP 138* 102* 72 99 83       Signed:  Carolyne Littles, MD [facility] [phone] pager

## 2014-09-22 NOTE — Progress Notes (Signed)
Advanced Heart Failure Rounding Note   Subjective:    Remains weak. No complaints. Has met with palliative care team and now comfort care. Planning d/c to [facility] today. ICD and dobutamine are off.   Objective:   Weight Range:  Vital Signs:   Temp:  [97.6 F (36.4 C)-97.7 F (36.5 C)] 97.7 F (36.5 C) (05/06 0747) Pulse Rate:  [121-125] 125 (05/06 0747) Resp:  [16-20] 20 (05/06 0747) BP: (84-96)/(61-63) 84/61 mmHg (05/06 0747) SpO2:  [100 %] 100 % (05/06 0747) Last BM Date: 09/17/14  Weight change: Filed Weights   09/19/14 0800  Weight: 57 kg (125 lb 10.6 oz)    Intake/Output:   Intake/Output Summary (Last 24 hours) at 09/22/14 1321 Last data filed at 09/22/14 0900  Gross per 24 hour  Intake    250 ml  Output    320 ml  Net    -70 ml     Physical Exam: General: Cachetic Chronically ill appearing. No acute distress.  HEENT: normal except for poor dentition Neck: supple. JVP jaw. RIJ TLC Carotids 2+ bilaterally; no bruits. No lymphadenopathy or thryomegaly appreciated. Cor: PMI displaced laterally. Tachy, regular rate and rhythm. 2/6 MR +s3 Lungs: clear Abdomen: Non tender. Good bowel sounds. Non distended Extremities: no cyanosis, clubbing, rash, R and LLE tr-1+ woody edema Neuro: alert & orientedx3, cranial nerves grossly intact. Moves all 4 extremities w/o difficulty. Affect pleasant.  Telemetry: Sinus Tach 120s  Labs: Basic Metabolic Panel:  Recent Labs Lab 09/18/14 1240 09/18/14 1254 09/19/14 0232 09/20/14 0340  NA 138 137 141 139  K 4.8 4.6 4.2 3.1*  CL 102 105 106 102  CO2 19*  --  17* 24  GLUCOSE 117* 113* 149* 68*  BUN 77* 66* 71* 62*  CREATININE 2.89* 3.00* 2.58* 2.14*  CALCIUM 10.0  --  9.2 8.9  MG  --   --   --  1.8    Liver Function Tests:  Recent Labs Lab 09/18/14 1317 09/19/14 0232 09/20/14 0340  AST 210* 119* 73*  ALT 102* 75* 61  ALKPHOS 180* 142* 116  BILITOT 2.9* 1.9* 2.2*  PROT 6.5 5.2* 4.7*  ALBUMIN 4.0 2.9*  2.6*   No results for input(s): LIPASE, AMYLASE in the last 168 hours. No results for input(s): AMMONIA in the last 168 hours.  CBC:  Recent Labs Lab 09/18/14 1240 09/18/14 1254 09/19/14 0232 09/20/14 0340  WBC 9.6  --  8.6 10.3  NEUTROABS  --   --   --  9.3*  HGB 15.8 18.7* 14.5 13.4  HCT 47.2 55.0* 43.7 39.9  MCV 84.1  --  84.2 84.4  PLT 72*  --  68* 55*    Cardiac Enzymes: No results for input(s): CKTOTAL, CKMB, CKMBINDEX, TROPONINI in the last 168 hours.  BNP: BNP (last 3 results)  Recent Labs  05/15/14 1445 09/01/14 1512  BNP 34.0 1480.0*    ProBNP (last 3 results) No results for input(s): PROBNP in the last 8760 hours.    Other results:  Imaging: No results found.   Medications:     Scheduled Medications: . digoxin  0.125 mg Oral Daily  . docusate sodium  100 mg Oral BID  . feeding supplement (ENSURE ENLIVE)  237 mL Oral TID BM  . morphine CONCENTRATE  5 mg Oral Q4H    Infusions:    PRN Medications: acetaminophen **OR** acetaminophen, albuterol, bisacodyl, diphenhydrAMINE, LORazepam, morphine injection, ondansetron **OR** ondansetron (ZOFRAN) IV   Assessment:   1. Cardiogenic  shock 2. Acute on chronic systolic HF  3. Acute on chronic renal failure, stage 4 4. Shock liver 5. Severe protein calorie malnutrition 6. Cachexia 7. Hypokalemia   Plan/Discussion:    He is now comfort care only. Agree with transfer to [facility]. Start morphine and benzos as needed.   Harris Penton,MD 1:21 PM

## 2014-09-22 NOTE — Progress Notes (Addendum)
CSW (Clinical Child psychotherapist) continues to look for residential hospice placement for pt. CSW spoke with [facility] who confirmed at this time they still do not have a bed available but will update CSW this afternoon. CSW awaiting call back from [facility] social worker to confirm bed availability. If [facility] is not an option for today, CSW will speak with family again about other facilities.   ADDENDUM: CSW notified by [facility] social worker that they can accept pt today. CSW called pt sister Kathie Rhodes and notified. CSW asked Kathie Rhodes to please call [facility] social worker as soon as possible and provided with contact information. MD made aware.   Lesly Joslyn, LCSWA [phone]

## 2014-09-22 NOTE — Care Management Note (Signed)
Case Management Note  Patient Details  Name: [name] MRN: [id number] Date of Birth: 1945/11/20  Subjective/Objective:                    Action/Plan:   Expected Discharge Date:   (unknown)               Expected Discharge Plan:  Hospice Medical Facility  In-House Referral:  Hospice / Palliative Care, Clinical Social Work  Discharge planning Services     Post Acute Care Choice:    Choice offered to:     DME Arranged:    DME Agency:     HH Arranged:  Nurse's Aide HH Agency:     Status of Service:     Medicare Important Message Given:  Yes Date Medicare IM Given:  09/21/14 Medicare IM give by:  debbie Chirstina Haan rn,bsn Date Additional Medicare IM Given:    Additional Medicare Important Message give by:     If discussed at Long Length of Stay Meetings, dates discussed:    Additional Comments:5/6 per sw pt going to [facility] today.   Hanley Hays, RN 09/22/2014, 10:51 AM

## 2014-09-22 NOTE — Progress Notes (Signed)
Discharged to [facility] by  Ambulance, discharged papers  given to ambulance staff, belonging with  Pt.

## 2014-09-22 NOTE — Progress Notes (Signed)
CSW (Clinical Child psychotherapist) spoke with Artist and provided with information for [facility] Eaton [phone]) who was helping pt with fiances prior to admission. Pt and pt family agreeable to CSW and [facility] speaking with Peyton Najjar to obtain necessary information. [facility] confirmed plan will be for family to complete paperwork at facility around 1pm. CSW to arrange transport after confirmation received that [facility] is ready for pt. Pt and pt family aware of plan. CSW also updated nurse and provided with number to call report.   Ethaniel Garfield, LCSWA [phone]

## 2014-09-22 NOTE — Progress Notes (Signed)
Report given to [facility].

## 2014-09-24 LAB — CULTURE, BLOOD (ROUTINE X 2): Culture: NO GROWTH

## 2014-09-25 LAB — CULTURE, BLOOD (ROUTINE X 2): Culture: NO GROWTH

## 2014-10-18 DEATH — deceased

## 2014-10-31 ENCOUNTER — Encounter: Payer: Self-pay | Admitting: *Deleted

## 2015-07-05 IMAGING — CR DG CHEST 1V PORT
1 series · 1 of 1 positions shown · non-contrast
Comparison: CT chest 07/20/2014.  PA and lateral chest 08/12/2014.

CLINICAL DATA: Shortness of breath and weakness.

EXAM:
PORTABLE CHEST - 1 VIEW

[AP]
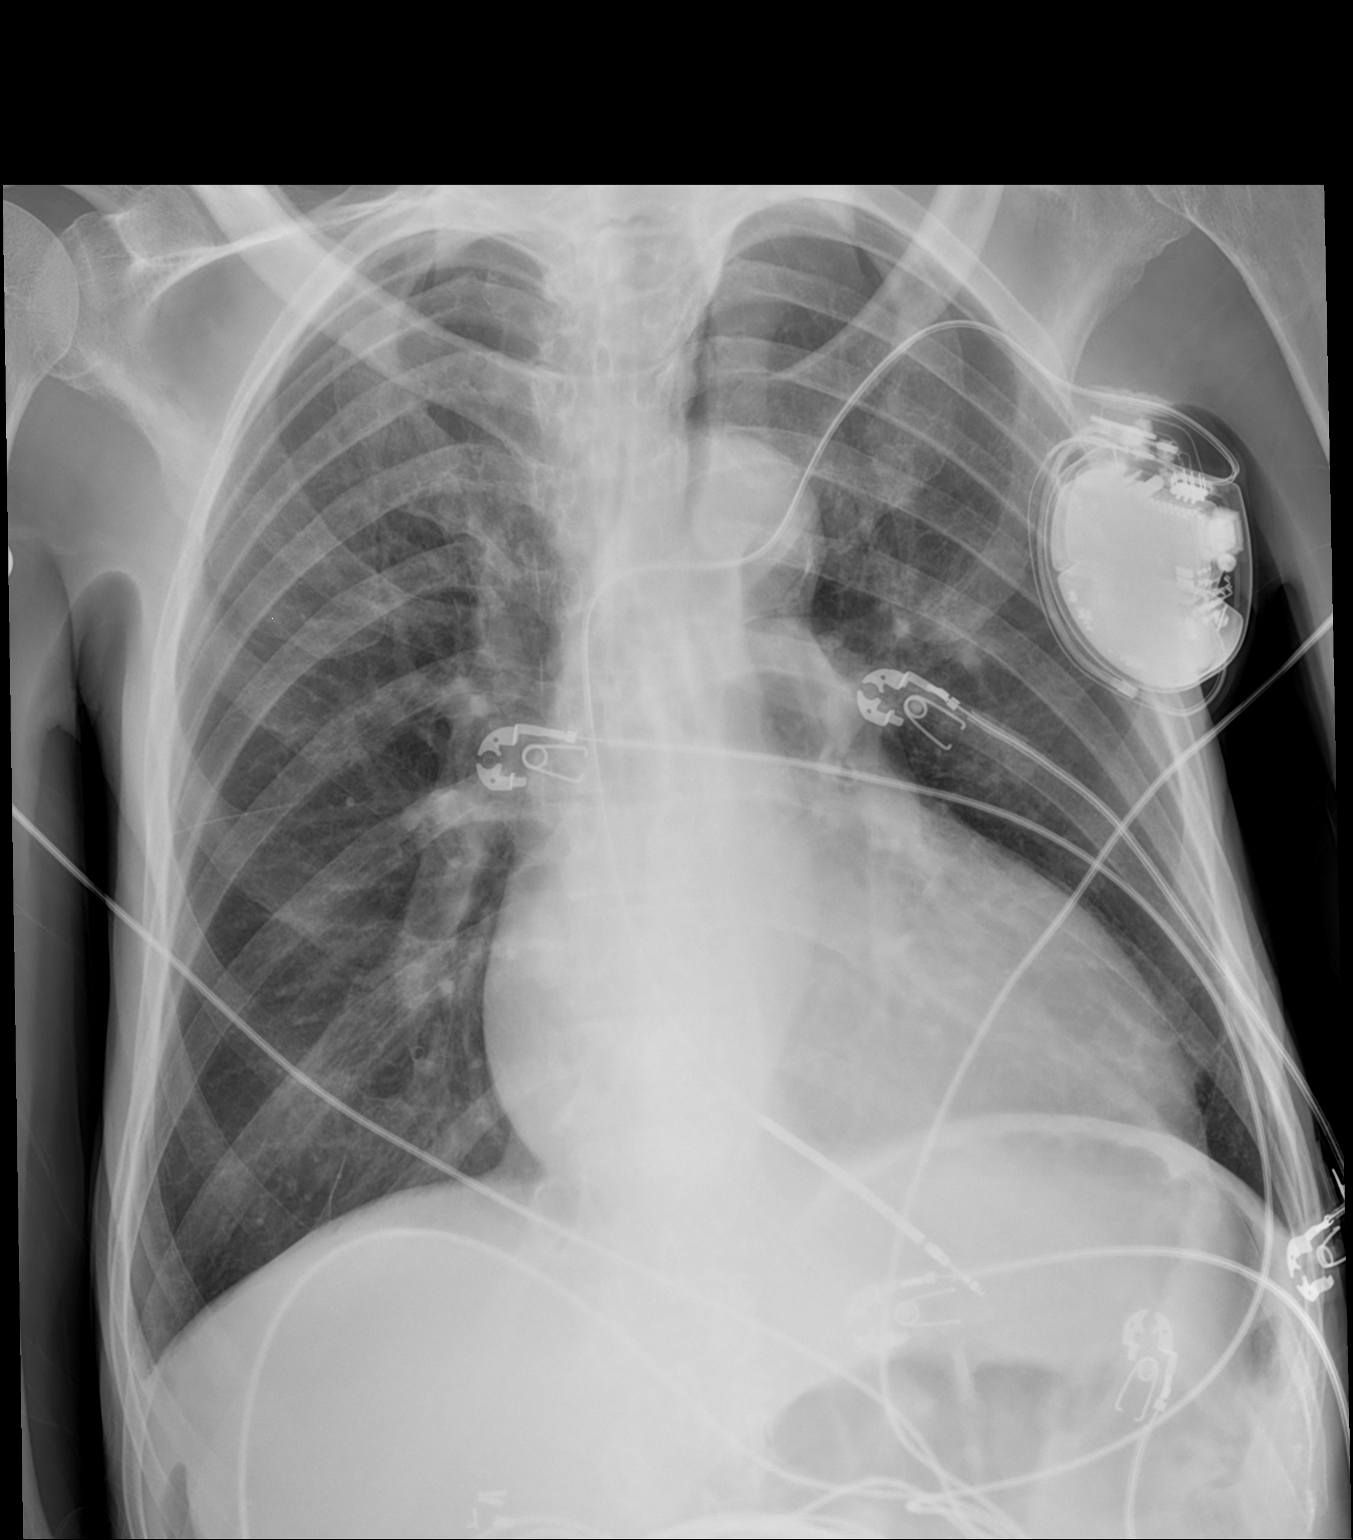

[1 of 1 positions shown; findings below may reference images not displayed]

FINDINGS: Single lead AICD is again seen. There is cardiomegaly but no
pulmonary edema. The lungs are clear. No pneumothorax or pleural
effusion is identified.
IMPRESSION: Cardiomegaly without acute disease.

## 2015-07-05 IMAGING — CR DG CHEST 1V PORT
1 series · 1 of 1 positions shown · non-contrast
Comparison: Chest x-ray from the same day at [DATE] p.m.

CLINICAL DATA: Central line placement

EXAM:
PORTABLE CHEST - 1 VIEW

[AP]
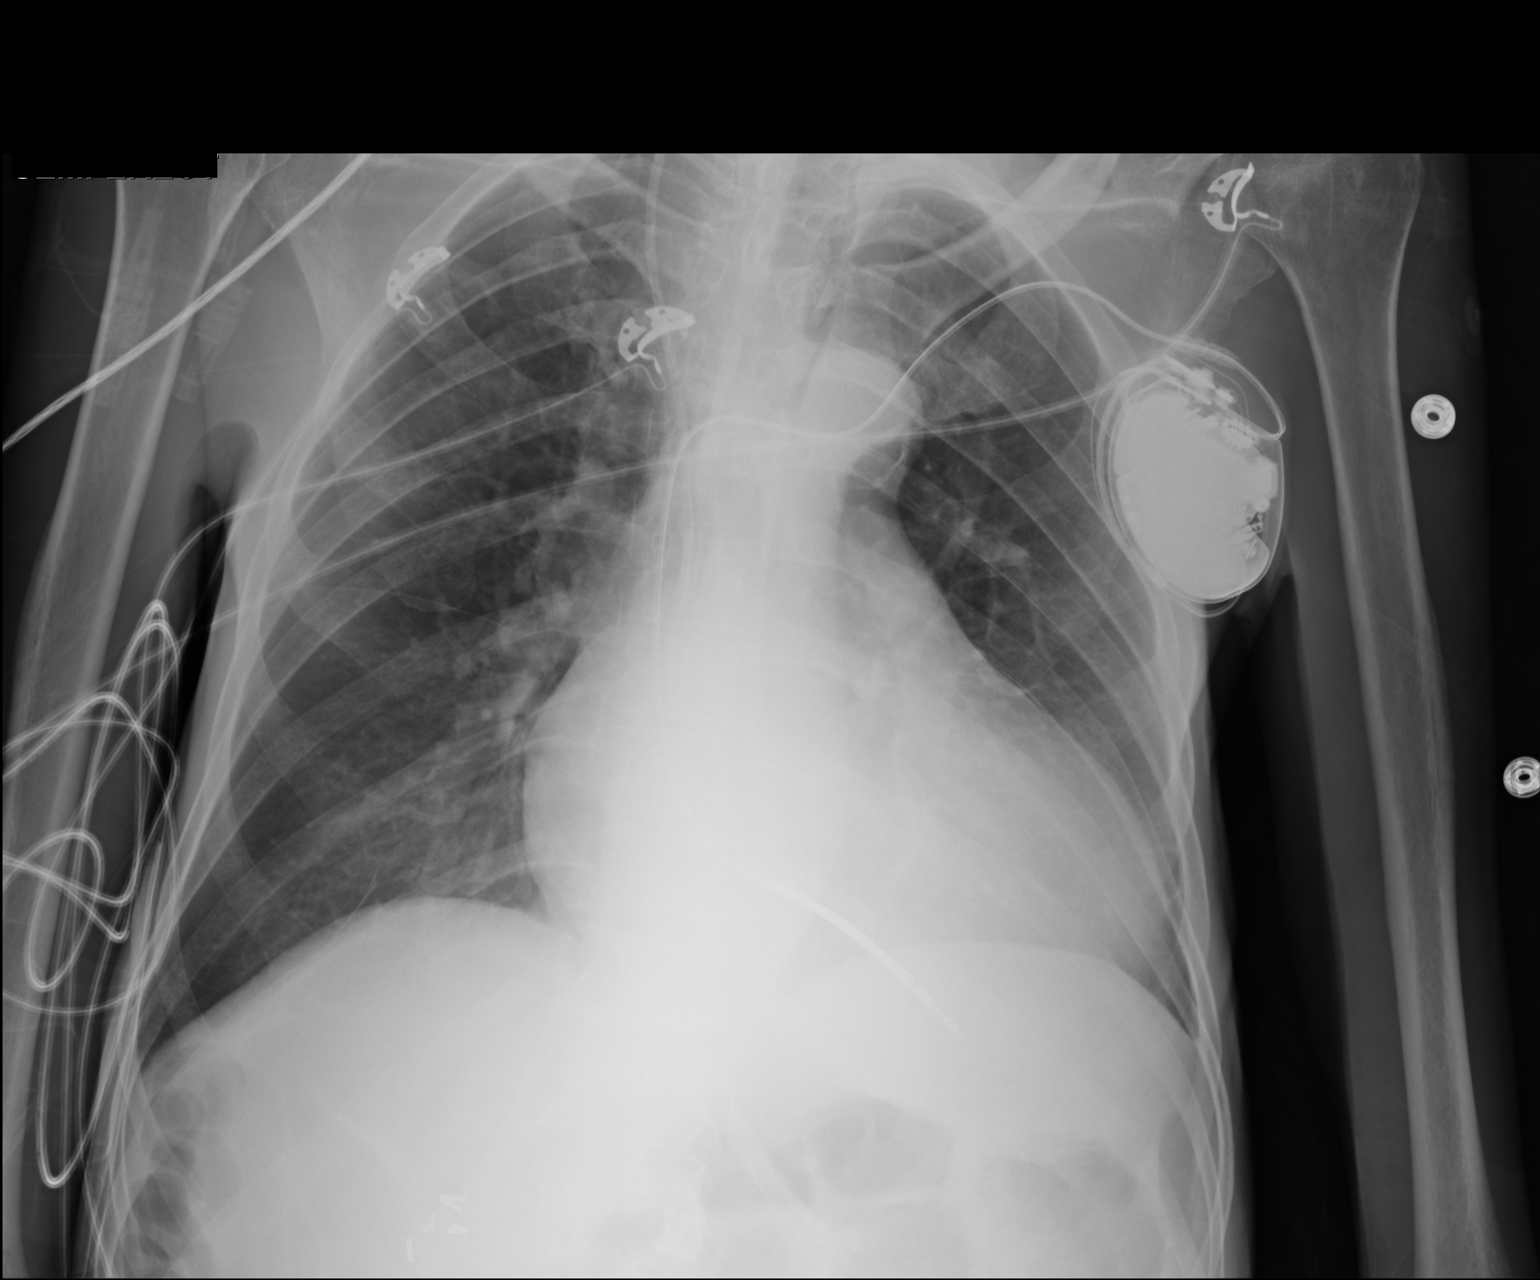

[1 of 1 positions shown; findings below may reference images not displayed]

FINDINGS: New right IJ central line, tip at the upper cavoatrial junction. No
pneumothorax.

Stable positioning of a right ventricular ICD/pacer lead.

Unchanged cardiomegaly and aortic tortuosity. There is no edema,
consolidation, effusion, or pneumothorax.
IMPRESSION: 1. New right IJ central line is in good position.  No pneumothorax.
2. Cardiomegaly without failure.
